# Patient Record
Sex: Female | Born: 1960 | Race: White | Hispanic: No | Marital: Married | Smoking: Never smoker
Health system: Southern US, Community
[De-identification: ages and names within clinical notes are randomized; demographics above are authoritative.]

## PROBLEM LIST (undated history)

## (undated) DIAGNOSIS — R42 Dizziness and giddiness: Secondary | ICD-10-CM

## (undated) DIAGNOSIS — M1990 Unspecified osteoarthritis, unspecified site: Secondary | ICD-10-CM

## (undated) DIAGNOSIS — R002 Palpitations: Secondary | ICD-10-CM

## (undated) DIAGNOSIS — I499 Cardiac arrhythmia, unspecified: Secondary | ICD-10-CM

## (undated) DIAGNOSIS — I4891 Unspecified atrial fibrillation: Secondary | ICD-10-CM

## (undated) DIAGNOSIS — Z780 Asymptomatic menopausal state: Secondary | ICD-10-CM

## (undated) DIAGNOSIS — E785 Hyperlipidemia, unspecified: Secondary | ICD-10-CM

## (undated) DIAGNOSIS — D649 Anemia, unspecified: Secondary | ICD-10-CM

## (undated) DIAGNOSIS — C801 Malignant (primary) neoplasm, unspecified: Secondary | ICD-10-CM

## (undated) DIAGNOSIS — E538 Deficiency of other specified B group vitamins: Secondary | ICD-10-CM

## (undated) DIAGNOSIS — E039 Hypothyroidism, unspecified: Secondary | ICD-10-CM

## (undated) DIAGNOSIS — I10 Essential (primary) hypertension: Secondary | ICD-10-CM

## (undated) HISTORY — DX: Palpitations: R00.2

## (undated) HISTORY — DX: Asymptomatic menopausal state: Z78.0

## (undated) HISTORY — DX: Unspecified atrial fibrillation: I48.91

## (undated) HISTORY — DX: Hyperlipidemia, unspecified: E78.5

## (undated) HISTORY — PX: ABDOMINAL HYSTERECTOMY: SHX81

## (undated) HISTORY — PX: OTHER SURGICAL HISTORY: SHX169

## (undated) HISTORY — DX: Anemia, unspecified: D64.9

## (undated) MED FILL — Dexamethasone Sodium Phosphate Inj 100 MG/10ML: INTRAMUSCULAR | Qty: 1 | Status: AC

---

## 2006-08-24 ENCOUNTER — Ambulatory Visit: Payer: Self-pay | Admitting: Unknown Physician Specialty

## 2006-08-24 HISTORY — PX: DILATION AND CURETTAGE OF UTERUS: SHX78

## 2006-09-12 HISTORY — PX: BILATERAL SALPINGOOPHORECTOMY: SHX1223

## 2006-09-12 HISTORY — PX: LAPAROSCOPIC SUPRACERVICAL HYSTERECTOMY: SUR797

## 2006-10-05 ENCOUNTER — Ambulatory Visit: Payer: Self-pay | Admitting: Unknown Physician Specialty

## 2006-12-07 ENCOUNTER — Ambulatory Visit: Payer: Self-pay | Admitting: Unknown Physician Specialty

## 2008-03-12 HISTORY — PX: NOSE SURGERY: SHX723

## 2008-04-17 ENCOUNTER — Ambulatory Visit: Payer: Self-pay | Admitting: Unknown Physician Specialty

## 2008-07-05 ENCOUNTER — Ambulatory Visit: Payer: Self-pay | Admitting: *Deleted

## 2008-07-06 ENCOUNTER — Observation Stay (HOSPITAL_COMMUNITY): Admission: EM | Admit: 2008-07-06 | Discharge: 2008-07-06 | Payer: Self-pay | Admitting: Emergency Medicine

## 2008-07-15 ENCOUNTER — Ambulatory Visit: Payer: Self-pay

## 2008-07-15 ENCOUNTER — Encounter: Payer: Self-pay | Admitting: Cardiology

## 2008-07-22 ENCOUNTER — Ambulatory Visit: Payer: Self-pay | Admitting: Cardiology

## 2008-08-29 ENCOUNTER — Ambulatory Visit: Payer: Self-pay | Admitting: Cardiology

## 2009-02-14 DIAGNOSIS — R002 Palpitations: Secondary | ICD-10-CM | POA: Insufficient documentation

## 2009-02-14 DIAGNOSIS — I4891 Unspecified atrial fibrillation: Secondary | ICD-10-CM | POA: Insufficient documentation

## 2009-02-17 ENCOUNTER — Ambulatory Visit: Payer: Self-pay | Admitting: Cardiology

## 2009-04-22 ENCOUNTER — Ambulatory Visit: Payer: Self-pay | Admitting: Unknown Physician Specialty

## 2009-12-08 ENCOUNTER — Ambulatory Visit: Payer: Self-pay

## 2009-12-24 ENCOUNTER — Ambulatory Visit: Payer: Self-pay | Admitting: Family Medicine

## 2010-02-18 ENCOUNTER — Ambulatory Visit: Payer: Self-pay | Admitting: Cardiovascular Disease

## 2010-02-22 ENCOUNTER — Ambulatory Visit: Payer: Self-pay | Admitting: Cardiovascular Disease

## 2010-02-25 LAB — CONVERTED CEMR LAB: Triglycerides: 408 mg/dL — ABNORMAL HIGH (ref <150)

## 2010-05-18 ENCOUNTER — Ambulatory Visit: Payer: Self-pay | Admitting: Unknown Physician Specialty

## 2010-10-14 NOTE — Assessment & Plan Note (Signed)
Summary: EC6/AMD  Medications Added ASPIRIN 81 MG TABS (ASPIRIN) 1 once daily      Allergies Added: ! SULFA  Visit Type:  Initial Consult Referring Provider:  Valera Castle, M.D. Primary Provider:  Blane Ohara, M.D.  CC:  no complaints.  History of Present Illness: Ariana Wilson is a very pleasant 50 year old woman with a history of paroxysmal atrial fibrillation who has been managed on diltiazem XRT 240 mg daily with little breakthrough arrhythmia. She presents for routine followup.  She states that over the past year, she has felt well. Occasionally she has some brief episodes of tachyarrhythmia in the evenings and she is due to take her dose of diltiazem. She is to take it in the morning but has switched it to the evenings. Overall she feels well with no shortness of breath, palpitations. She is active, works as a Chartered loss adjuster. She's been trying to lose weight and works out at [employer] when she can.  Current Medications (verified): 1)  Premarin 0.625 Mg Tabs (Estrogens Conjugated) .[name].. 1 By Mouth Once Daily 2)  Allegra 180 Mg Tabs (Fexofenadine Hcl) .[name].. 1 By Mouth Once Daily 3)  Dhea 15-50 Mg Caps (Nutritional Supplements) .[name].. 1 By Mouth Once Daily 4)  B-Complex/b-12  Tabs (B Complex Vitamins) .[name].. 1 By Mouth Once Daily 5)  Vitamin E 400 Unit Caps (Vitamin E) .[name].. 1 By Mouth Once Daily 6)  Aspirin 81 Mg Tabs (Aspirin) .[name].. 1 Once Daily 7)  Diltiazem Hcl Er Beads 240 Mg Xr24h-Cap (Diltiazem Hcl Er Beads) .... Take One Capsule By Mouth Daily  Allergies (verified): 1)  ! Sulfa  Past History:  Past Medical History: Last updated: 02/14/2009 Atrial Fibrillation Hx: palpitations 25 years ago  Past Surgical History: Last updated: 02/14/2009 Abdominal Hysterectomy-Total Bilateral salpingoophyorectomy  Family History: Last updated: 02/14/2009 Father: CABG age of 39. Sister: atrial fib  Social History: Last updated: 02/14/2009 Full Time Married  Tobacco Use - No.  Alcohol  Use - no Regular Exercise - yes Drug Use - no 2 children  Risk Factors: Exercise: yes (02/14/2009)  Risk Factors: Smoking Status: never (02/14/2009)  Review of Systems  The patient denies fever, weight loss, weight gain, vision loss, decreased hearing, hoarseness, chest pain, syncope, dyspnea on exertion, peripheral edema, prolonged cough, abdominal pain, incontinence, muscle weakness, depression, and enlarged lymph nodes.         Palpitations  Vital Signs:  Patient profile:   50 year old female Height:      60 inches Weight:      170 pounds BMI:     33.32 Pulse rate:   73 / minute BP sitting:   121 / 78  (right arm) Cuff size:   regular  Vitals Entered By: Bishop Dublin, CMA (February 18, 2010 3:43 PM)  Physical Exam  General:  Well developed, well nourished, in no acute distress. Head:  normocephalic and atraumatic Neck:  Neck supple, no JVD. No masses, thyromegaly or abnormal cervical nodes. Lungs:  Clear bilaterally to auscultation and percussion. Heart:  Non-displaced PMI, chest non-tender; regular rate and rhythm, S1, S2 without murmurs, rubs or gallops. Carotid upstroke normal, no bruit. . Pedals normal pulses. No edema, no varicosities. Abdomen:  Bowel sounds positive; abdomen soft and non-tender without masses,  Msk:  Back normal, normal gait. Muscle strength and tone normal. Pulses:  pulses normal in all 4 extremities Extremities:  No clubbing or cyanosis. Neurologic:  Alert and oriented x 3. Skin:  Intact without lesions or rashes. Psych:  Normal affect.   Impression & Recommendations:  Problem # 1:  ATRIAL FIBRILLATION (ICD-427.31) currently maintaining sinus rhythm. Few episodes of palpitations. She is not on Coumadin, only aspirin. We'll continue her on her current dose of diltiazem.  Her updated medication list for this problem includes:    Aspirin 81 Mg Tabs (Aspirin) .[name]... 1 once daily  Orders: EKG w/ Interpretation (93000)  Problem # 2:   PREVENTIVE HEALTH CARE (ICD-V70.0) She has a strong family history of coronary artery disease. Father had CAD at age 23. We have suggested that she check her cholesterol as it has not been checked in many years.  Patient Instructions: 1)  Your physician recommends that you return for a FASTING lipid profile: on MONDAY  2)  Your physician recommends that you continue on your current medications as directed. Please refer to the Current Medication list given to you today. 3)  Your physician wants you to follow-up in:   12 months You will receive a reminder letter in the mail two months in advance. If you don't receive a letter, please call our office to schedule the follow-up appointment. Prescriptions: DILTIAZEM HCL ER BEADS 240 MG XR24H-CAP (DILTIAZEM HCL ER BEADS) Take one capsule by mouth daily  #90 x 4   Entered by:   Benedict Needy, RN   Authorized by:   Dossie Arbour MD   Signed by:   Benedict Needy, RN on 02/18/2010   Method used:   Electronically to        [facility] [phone]* (retail)       [address]       [place], [place]  [address]       Ph: [phone] or [phone]       Fax: [phone]   RxID:   [id number]

## 2010-11-12 ENCOUNTER — Ambulatory Visit: Payer: Self-pay | Admitting: Internal Medicine

## 2011-01-25 NOTE — Assessment & Plan Note (Signed)
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   She has had about 10 episodes of the past year.  Some will last minutes  and 1 last as long as an hour.  She has not had to visit an Urgent Care  Center or emergency room.   She remains on diltiazem extended release 240 mg each night.  She has  found out that if she takes at night, it seems to be do better.  She  remains on aspirin now takes 81 mg per day as supposed to 325.   She has been very health conscious and has been working out at [employer] 3 times a week.  She has lost down from 173-159.   PHYSICAL EXAMINATION:  VITAL SIGNS:  Her blood pressure today is 114/78,  her pulse is 76 and regular, her weight is 159.  NECK:  Supple.  Carotid upstrokes were equal bilaterally without bruits.  No thyromegaly and trachea is midline.  LUNGS:  Clear to auscultation and percussion.  HEART:  Nondisplaced PMI, normal S1 and S2.  No gallop.  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  ABDOMEN:  Soft.  Good bowel sounds.  No midline bruit.  No hepatomegaly.  EXTREMITIES:  No cyanosis, clubbing, or edema.  Pulses are brisk.  NEURO:  Intact.  SKIN:  Shows freckles, otherwise unremarkable.   ASSESSMENT AND PLAN:  Ariana Wilson is doing well.  I have renewed her  diltiazem and advised her that this seems to be working better at night  to continue do so.  She will remain on aspirin at 81 mg a day.  I will  see her back in a year.      Thomas C. Daleen Squibb, MD, [facility]  Electronically Signed    TCW/MedQ  DD: 02/17/2009  DT: 02/18/2009  Job #: [id number]

## 2011-01-25 NOTE — Assessment & Plan Note (Signed)
[facility] OFFICE NOTE   NAME:ISLEYShaelynn, Dragos                          MRN:          [id number]  DATE:08/29/2008                            DOB:          [birth year]    Ms. Clippinger comes in today for followup of her idiopathic atrial fib.   She still has occasional palpitations that can last seconds to one event  which lasted about an hour.  She has not had any prolonged events that  did not respond, but did not spontaneously convert.   MEDICATIONS:  She is on:  1. Diltiazem 240 mg a day.  2. Aspirin 325 mg a day.   PHYSICAL EXAMINATION:  VITAL SIGNS:  Her blood pressure today is 118/78,  her pulse is 80 and regular, and weight is 173.  HEENT:  Normal.  NECK:  Carotids upstrokes were equal bilaterally without bruits.  No  thyromegaly.  LUNGS:  Clear to auscultation and percussion.  HEART:  Regular rate and rhythm.  No gallop or rub or murmur.  PMI could  not be appreciated.  ABDOMEN:  Soft, good bowel sounds.  EXTREMITIES:  No cyanosis, clubbing, or edema.  Pulses are intact.  NEURO:  Intact.   Ms. Raphael is doing well.  I spent about 20 minutes answering questions  about future therapy, prognosis, and how to react to and respond to  prolonged atrial fibrillation, i.e., greater than 24 hours.  I will plan  on seeing her back again in 6 months unless she has increased  breakthroughs.     Thomas C. Daleen Squibb, MD, [facility]  Electronically Signed    TCW/MedQ  DD: 08/29/2008  DT: 08/30/2008  Job #: [id number]

## 2011-01-25 NOTE — H&P (Signed)
NAMEAFTIN, LYE                 ACCOUNT NO.:  [id number]   MEDICAL RECORD NO.:  [id number]          PATIENT TYPE:  EMS   LOCATION:  MAJO                         FACILITY:  [facility]   PHYSICIAN:  Glennie Isle, MD   DATE OF BIRTH:  10-Oct-1960   DATE OF ADMISSION:  07/05/2008  DATE OF DISCHARGE:                              HISTORY & PHYSICAL   CARDIOLOGIST:  The patient is new to the [place] system.   CHIEF COMPLAINT:  Palpitations.   HISTORY OF PRESENT ILLNESS:  The patient is a 50 year old female with a  history of palpitations approximately 25 years ago who currently  presents with palpitations since 10 p.m.  She states that these are also  associated with some chest pressure though she denies any diaphoresis,  lightheadedness.  The patient does state that she had sudden onset of  palpitations that awoke her.  She was found to be in atrial fibrillation  with rapid ventricular response here in the ER and required IV diltiazem  10 mg x2.  She denies any alcohol, caffeine or weight loss supplements.  She does endorse palpitations for the past few months lasting a few  seconds.  She states she has occasional chest pressure and fatigue for  the past several months as well.  She is occasionally too tired to run  anymore.  She denies any thyroid symptoms.   PAST MEDICAL HISTORY:  1. History of palpitations 25 years ago.  2. History of total abdominal hysterectomy and bilateral salpingo-      oophorectomy.   ALLERGIES:  SULFA.   MEDICATIONS:  1. Premarin 0.625 mg.  2. Allegra 180 mg daily.  3. DHEA, B12 and vitamin E.   SOCIAL HISTORY:  She has two children.  Denies any tobacco or alcohol or  IV drug use.  She currently works as a Lawyer and former  Lawyer.   FAMILY HISTORY:  Father had a CABG at age 67.  Sister with atrial  fibrillation.   REVIEW OF SYSTEMS:  A 14-point review of systems was performed and was  negative except as noted per HPI.   PHYSICAL  EXAMINATION:  VITAL SIGNS: Temperature 97.7, pulse 83,  respiratory rate 20, blood pressure 100/58.  GENERAL APPEARANCE:  In no apparent distress.  HEENT:  Pupils are equal, round and reactive to light.  Extraocular  movements intact.  Oropharynx clear.  NECK:  Supple.  No thyromegaly appreciated.  No bruits.  No JVD.  CARDIOVASCULAR:  S1 and S2 normal. No murmurs or rubs are heard.  No S3  or S4.  LUNGS:  Clear to auscultation bilaterally.  No crackles or wheezes.  SKIN:  No rashes.  ABDOMEN:  Soft, nontender, nondistended.  EXTREMITIES:  No clubbing, cyanosis or edema is noted.  MUSCULOSKELETAL:  No joint deformities.  NEUROLOGIC:  Alert and oriented x3.  Cranial nerves II-XII intact.  Strength is 5/5 upper and lower extremities bilaterally.  EKG:  First EKG shows atrial fibrillation with RVR with mild 0.5 to 1 mm  ST depression  in V2 to V5.  Most recent EKG shows normal  sinus rhythm.  No ST changes.   LABORATORY DATA:  White count 8.5, hemoglobin 14.5, platelets 264,000.  BMP shows BUN of 11, creatinine 0.8, potassium 3.5.  First set of  cardiac enzymes are negative with troponin less than 0.05 and a MB of  2.2.   ASSESSMENT/PLAN:  New onset of atrial fibrillation.  It is unclear the  duration of the atrial fibrillation.  The patient has had some  palpitations for the past several months on and off lasting a few  seconds.  The patient's  CHADS2 score is 0.  Consequently, we will treat  the patient with aspirin 325 mg.  We will check her thyroid function  panel, get an echocardiogram in the morning and continue to rule out  cardiac markers.  I would favor stress test on Monday given that the  patient does have a history of fatigue and chest pressure with activity  and occasional pain radiating to her left arm.  We will initiate low  dose diltiazem 50 mg p.o. q.i.d. We can discontinue that tomorrow  evening the day prior to stress test.      Glennie Isle, MD   Electronically Signed     SS/MEDQ  D:  07/06/2008  T:  07/06/2008  Job:  [id number]

## 2011-01-25 NOTE — Discharge Summary (Signed)
Ariana Wilson, Ariana Wilson                 ACCOUNT NO.:  [id number]   MEDICAL RECORD NO.:  [id number]          PATIENT TYPE:  INP   LOCATION:  2039                         FACILITY:  [facility]   PHYSICIAN:  Doylene Canning. Ladona Ridgel, MD    DATE OF BIRTH:  1961-02-25   DATE OF ADMISSION:  07/05/2008  DATE OF DISCHARGE:  07/06/2008                               DISCHARGE SUMMARY   DISCHARGE DIAGNOSES:  1. Atrial fibrillation, felt to be paroxysmal, probably idiopathic,      unknown duration with a CHADS score of 0.  Initiated Cardizem      therapy during hospitalization, being discharged home on diltiazem      XR 180 mg daily.  Echocardiogram to be done as outpatient and      follow up with Dr. Daleen Squibb.  2. Chest discomfort, most likely secondary to atrial fibrillation.  3. History of palpitations with strong family history of atrial      fibrillation and heart disease in father.   Ariana Wilson is a 50 year old female with no cardiac known history who  presents with palpitations and chest discomfort with a history of  palpitations.  No previous cardiac workup noted.  She has a history of  total abdominal hysterectomy, a father with bypass at age 18, and a  sister with atrial fibrillation.  She presented on day of admission with  chest discomfort.  A 12-lead EKG showing atrial fibrillation.  Hemoglobin 14.5, creatinine 0.8, and potassium 3.5.  Cardiac enzymes  were negative x2 sets.  Thyroid function within normal limits.  Dr. Daleen Squibb  in to see the patient on July 06, 2008.  Pending last set of cardiac  enzymes, if negative the patient will be allowed to be discharged home  to follow up with a 2-D echocardiogram in the office and follow up with  Dr. Daleen Squibb within the next 2 weeks.  We will continue to watch for third  set of enzymes and hopefully discharge the patient home today.  I have  written a prescription for her diltiazem.  She is to continue 180 mg  daily, and aspirin 325 daily.  She can resume her home  medications  including her Premarin, Allegra, calcium, and vitamins as previously  prescribed.   DURATION OF DISCHARGE ENCOUNTER:  Greater than 30 minutes.      Dorian Pod, ACNP      Doylene Canning. Ladona Ridgel, MD  Electronically Signed    MB/MEDQ  D:  07/06/2008  T:  07/07/2008  Job:  [id number]

## 2011-01-25 NOTE — Assessment & Plan Note (Signed)
[facility] OFFICE NOTE   NAME:ISLEYShawnna, Pancake                          MRN:          [id number]  DATE:07/22/2008                            DOB:          09-Jan-1961    Ariana Wilson returns today after being discharged from the hospital on  July 06, 2008, after experiencing new-onset idiopathic atrial  fibrillation.   She presented with chest discomfort.   Blood work was negative including a thyroid panel, cardiac enzymes.  A 2-  D echocardiogram subsequently in the office is normal.   She continues to have some palpitations.  She has had this most of her  life.  She has a PAC on her EKG today and otherwise sinus rhythm.   CURRENT MEDICATIONS:  1. Diltiazem XR 180 mg a day.  2. Aspirin 325 mg a day.  3. She is also on Premarin 0.625 mg per day.  4. Allegra 180 mg a day.  5. DHEA.  6. B12 complex.  7. Vitamin E.   PHYSICAL EXAMINATION:  GENERAL:  She is pleasant lady in no acute  distress.  VITAL SIGNS:  Her blood pressure is 150/72, her pulse is currently 68  and regular.  Her weight is 174.  HEENT:  Unchanged and normal.  NECK:  Carotids are full without bruits.  Thyroid is not enlarged.  LUNGS:  Clear to auscultation and percussion.  HEART:  Poorly appreciated PMI, soft S1 and S2.  No gallop.  EXTREMITIES:  No cyanosis, clubbing, or edema.  Pulses are intact.   EKG is normal except for the PAC.   I had a long talk with Ariana Wilson today.  All her questions were  answered.  I have encouraged her to eliminate caffeine, increase her  diltiazem 240 mg a day, and to see [place] back in about 6 weeks.  We also  talked about how to respond to future events of atrial fib.     Thomas C. Daleen Squibb, MD, [facility]  Electronically Signed    TCW/MedQ  DD: 07/22/2008  DT: 07/23/2008  Job #: [id number]

## 2011-02-24 ENCOUNTER — Encounter: Payer: Self-pay | Admitting: Cardiovascular Disease

## 2011-03-07 ENCOUNTER — Ambulatory Visit (INDEPENDENT_AMBULATORY_CARE_PROVIDER_SITE_OTHER): Payer: BC Managed Care – PPO | Admitting: Cardiovascular Disease

## 2011-03-07 ENCOUNTER — Encounter: Payer: Self-pay | Admitting: Cardiovascular Disease

## 2011-03-07 DIAGNOSIS — E785 Hyperlipidemia, unspecified: Secondary | ICD-10-CM | POA: Insufficient documentation

## 2011-03-07 DIAGNOSIS — I4891 Unspecified atrial fibrillation: Secondary | ICD-10-CM

## 2011-03-07 MED ORDER — DILTIAZEM HCL ER COATED BEADS 180 MG PO CP24: 180.0000 mg | ORAL_CAPSULE | Freq: Every day | ORAL | Status: DC

## 2011-03-07 NOTE — Assessment & Plan Note (Signed)
No further episodes of atrial fibrillation that she can appreciate. We will decrease her dose of diltiazem to 180 mg at her request. She does not have any particular symptoms on diltiazem 240 mg daily.

## 2011-03-07 NOTE — Progress Notes (Signed)
   Patient ID: Ariana Wilson, female    DOB: [birth year], 50 y.o.   MRN: [id number]  HPI Comments: Ms. Pulcini is a very pleasant 50 year old woman with a history of paroxysmal atrial fibrillation who has been managed on diltiazem XRT 240 mg daily with little breakthrough arrhythmia. She presents for routine followup.Her last episode of atrial fibrillation was November 2009. At that time, her rhythm converted to sinus without cardioversion.   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 She denies any significant tachycardia or palpitations. She is interested in being on a lower dose of diltiazem if possible. She has been biking, vacationing with no symptoms. No significant shortness breath or chest discomfort.  Blood pressure at home typically runs in the 120/60 range  EKG shows normal sinus rhythm with a rate of 70 beats per minute with no significant ST or T wave changes      Review of Systems  Constitutional: Negative.   HENT: Negative.   Eyes: Negative.   Respiratory: Negative.   Cardiovascular: Negative.   Gastrointestinal: Negative.   Musculoskeletal: Negative.   Skin: Negative.   Neurological: Negative.   Hematological: Negative.   Psychiatric/Behavioral: Negative.   All other systems reviewed and are negative.    BP 131/82  Pulse 72  Ht 5' (1.524 m)  Wt 167 lb (75.751 kg)  BMI 32.62 kg/m2   Physical Exam  Nursing note and vitals reviewed. Constitutional: She is oriented to person, place, and time. She appears well-developed and well-nourished.       obese  HENT:  Head: Normocephalic.  Nose: Nose normal.  Mouth/Throat: Oropharynx is clear and moist.  Eyes: Conjunctivae are normal. Pupils are equal, round, and reactive to light.  Neck: Normal range of motion. Neck supple. No JVD present.  Cardiovascular: Normal rate, regular rhythm, S1 normal, S2 normal, normal heart sounds and intact distal pulses.  Exam reveals no gallop and no  friction rub.   No murmur heard. Pulmonary/Chest: Effort normal and breath sounds normal. No respiratory distress. She has no wheezes. She has no rales. She exhibits no tenderness.  Abdominal: Soft. Bowel sounds are normal. She exhibits no distension. There is no tenderness.  Musculoskeletal: Normal range of motion. She exhibits no edema and no tenderness.  Lymphadenopathy:    She has no cervical adenopathy.  Neurological: She is alert and oriented to person, place, and time. Coordination normal.  Skin: Skin is warm and dry. No rash noted. No erythema.  Psychiatric: She has a normal mood and affect. Her behavior is normal. Judgment and thought content normal.         Assessment and Plan

## 2011-03-07 NOTE — Patient Instructions (Signed)
Your physician has recommended you make the following change in your medication: DECREASE Diltiazem to 120mg  daily.  Your physician recommends that you schedule a follow-up appointment in: 1 year

## 2011-03-07 NOTE — Assessment & Plan Note (Addendum)
We did spend significant time talking with her about her cholesterol. She reports recent cholesterol 280. This is likely secondary to obesity and genetics. She is not interested in any cholesterol medication at this time. Given her strong family history with father who had bypass surgery at age 50, she should be on a cholesterol medication with goal LDL less than 100.

## 2011-03-11 ENCOUNTER — Other Ambulatory Visit (INDEPENDENT_AMBULATORY_CARE_PROVIDER_SITE_OTHER): Payer: BC Managed Care – PPO | Admitting: *Deleted

## 2011-03-11 DIAGNOSIS — E785 Hyperlipidemia, unspecified: Secondary | ICD-10-CM

## 2011-03-18 ENCOUNTER — Telehealth: Payer: Self-pay | Admitting: Cardiovascular Disease

## 2011-03-18 NOTE — Telephone Encounter (Signed)
Results had not come through system, I have requested to be sent, and will call pt w/ results.

## 2011-03-18 NOTE — Telephone Encounter (Signed)
Would like lab results

## 2011-03-21 ENCOUNTER — Encounter: Payer: Self-pay | Admitting: Cardiovascular Disease

## 2011-03-22 ENCOUNTER — Telehealth: Payer: Self-pay | Admitting: *Deleted

## 2011-03-22 MED ORDER — ATORVASTATIN CALCIUM 10 MG PO TABS: 10.0000 mg | ORAL_TABLET | Freq: Every day | ORAL | Status: DC

## 2011-03-22 NOTE — Telephone Encounter (Signed)
Pt notified of lab results, per pt letter sent by TG, pt does want to start on low dose chol med. Will discuss with TG and call pt back.

## 2011-03-22 NOTE — Telephone Encounter (Signed)
Called and notified pt that if she wants to start on cholesterol medication due to her family hx and her recent labs, then he recommends Lipitor 10mg . Rx called in, and pt will return in 3 months for labwork.

## 2011-03-25 ENCOUNTER — Ambulatory Visit: Payer: Self-pay | Admitting: General Surgery

## 2011-06-14 ENCOUNTER — Ambulatory Visit: Payer: Self-pay | Admitting: Unknown Physician Specialty

## 2011-06-14 LAB — TROPONIN I: Troponin I: 0.02

## 2011-06-14 LAB — BASIC METABOLIC PANEL
Calcium: 8.5
Chloride: 104
Creatinine, Ser: 0.82
GFR calc Af Amer: >60
GFR calc non Af Amer: >60
Potassium: 3.5
Sodium: 136

## 2011-06-14 LAB — CBC
MCV: 93.3
RBC: 4.43
RDW: 12.7

## 2011-06-14 LAB — DIFFERENTIAL
Eosinophils Absolute: 0.2
Lymphocytes Relative: 30
Lymphs Abs: 2.6
Monocytes Absolute: 0.7
Monocytes Relative: 8

## 2011-06-14 LAB — CK TOTAL AND CKMB (NOT AT ARMC): CK, MB: 1.8

## 2011-06-14 LAB — CARDIAC PANEL(CRET KIN+CKTOT+MB+TROPI)
CK, MB: 1.5
Troponin I: 0.02

## 2011-06-14 LAB — POCT CARDIAC MARKERS: Troponin i, poc: <0.05

## 2011-06-21 ENCOUNTER — Ambulatory Visit (INDEPENDENT_AMBULATORY_CARE_PROVIDER_SITE_OTHER): Payer: BC Managed Care – PPO | Admitting: *Deleted

## 2011-06-21 DIAGNOSIS — E785 Hyperlipidemia, unspecified: Secondary | ICD-10-CM

## 2011-06-22 LAB — LIPID PANEL
LDL Cholesterol: 84 mg/dL (ref 0–99)
Triglycerides: 172 mg/dL — ABNORMAL HIGH (ref <150)
VLDL: 34 mg/dL (ref 0–40)

## 2011-06-22 LAB — HEPATIC FUNCTION PANEL
Albumin: 4.5 g/dL (ref 3.5–5.2)
Alkaline Phosphatase: 57 U/L (ref 39–117)
Indirect Bilirubin: 0.5 mg/dL (ref 0.0–0.9)
Total Bilirubin: 0.6 mg/dL (ref 0.3–1.2)

## 2011-06-25 ENCOUNTER — Encounter: Payer: Self-pay | Admitting: Cardiovascular Disease

## 2011-07-14 ENCOUNTER — Ambulatory Visit (INDEPENDENT_AMBULATORY_CARE_PROVIDER_SITE_OTHER): Payer: BC Managed Care – PPO | Admitting: Internal Medicine

## 2011-07-14 ENCOUNTER — Encounter: Payer: Self-pay | Admitting: Internal Medicine

## 2011-07-14 VITALS — BP 110/72 | HR 77 | Temp 97.3°F | Ht 61.5 in | Wt 173.0 lb

## 2011-07-14 DIAGNOSIS — N951 Menopausal and female climacteric states: Secondary | ICD-10-CM

## 2011-07-14 DIAGNOSIS — E785 Hyperlipidemia, unspecified: Secondary | ICD-10-CM

## 2011-07-14 DIAGNOSIS — R635 Abnormal weight gain: Secondary | ICD-10-CM

## 2011-07-14 DIAGNOSIS — I4891 Unspecified atrial fibrillation: Secondary | ICD-10-CM

## 2011-07-15 ENCOUNTER — Encounter: Payer: Self-pay | Admitting: Internal Medicine

## 2011-07-15 DIAGNOSIS — D649 Anemia, unspecified: Secondary | ICD-10-CM | POA: Insufficient documentation

## 2011-07-15 DIAGNOSIS — Z780 Asymptomatic menopausal state: Secondary | ICD-10-CM | POA: Insufficient documentation

## 2011-07-15 DIAGNOSIS — D61811 Other drug-induced pancytopenia: Secondary | ICD-10-CM | POA: Insufficient documentation

## 2011-07-15 LAB — CBC WITH DIFFERENTIAL/PLATELET
Basophils Relative: 0 % (ref 0–1)
Eosinophils Absolute: 0.4 10*3/uL (ref 0.0–0.7)
Eosinophils Relative: 6 % — ABNORMAL HIGH (ref 0–5)
HCT: 41.8 % (ref 36.0–46.0)
Hemoglobin: 14.3 g/dL (ref 12.0–15.0)
MCH: 32.1 pg (ref 26.0–34.0)
MCHC: 34.2 g/dL (ref 30.0–36.0)
Monocytes Absolute: 0.7 10*3/uL (ref 0.1–1.0)
Monocytes Relative: 11 % (ref 3–12)

## 2011-07-15 NOTE — Progress Notes (Signed)
Subjective:    Patient ID: Ariana Wilson, female    DOB: [birth year], 50 y.o.   MRN: [id number]  HPI New pt. Here for first visit.  PMH of a fib initially dx 2009, spontaneously converted and rate controlled on Diltiazem 180 mg, hyperlipidemia, endometriosis S/P hysterectomy, anemia in past and symptomatic menopause.  Her vasomotor flushes have been quite well controlled on Premarin 0.625 mg by her GYN Dr. Haskel Khan but she wishes to discuss other options,  She has been on Premarin since 2008.  She is most concerned about weight gain and decrease in short term memory.  "I can't seem to multi-task like I could in the past"  She is active, trained as a CMA, now works as a Geophysicist/field seismologist and husband is a Optician, dispensing.  She doses admit to depression in the past but describes as mild.  She has no chest pain, no palpitations or SOB.  She is up to date with her pap 04/2011 normal per her report,  Mammogram 06/2011 normal per her report and colonoscopy 03/2011 per her report.      Allergies  Allergen Reactions  . Sulfonamide Derivatives Swelling    Tongue swelling   Past Medical History  Diagnosis Date  . Atrial fibrillation   . Palpitations     Hx of, 25 years ago  . Menopause   . Anemia   . Hyperlipidemia    Past Surgical History  Procedure Date  . Total abdominal hysterectomy   . Bilateral salpingoophorectomy   . Dilation and curettage of uterus 08/24/06  . Nose surgery 7/09   History   Social History  . Marital Status: Married    Spouse Name: N/A    Number of Children: N/A  . Years of Education: N/A   Occupational History  . Full time    Social History Main Topics  . Smoking status: Never Smoker   . Smokeless tobacco: Not on file  . Alcohol Use: No  . Drug Use: No  . Sexually Active: Not on file   Other Topics Concern  . Not on file   Social History Narrative   Married with 2 childrenGets regular exercise   Family History  Problem Relation Age of Onset  . Arrhythmia  Sister     Atrial fibrillation  . Heart disease Sister   . Heart disease Father   . Hyperlipidemia Father   . Hyperlipidemia Mother   . Hypertension Mother    Patient Active Problem List  Diagnoses  . ATRIAL FIBRILLATION  . PALPITATIONS  . Hyperlipemia  . Anemia  . Hyperlipidemia  . Menopause   Current Outpatient Prescriptions on File Prior to Visit  Medication Sig Dispense Refill  . aspirin 81 MG tablet Take 81 mg by mouth daily.        [name] atorvastatin (LIPITOR) 10 MG tablet Take 1 tablet (10 mg total) by mouth daily.  30 tablet  6  . Cholecalciferol (VITAMIN D3) 5000 UNITS TABS Take 5,000 Units by mouth 1 dose over 24 hours.        [name] diltiazem (CARDIZEM CD) 180 MG 24 hr capsule Take 1 capsule (180 mg total) by mouth daily.  30 capsule  6  . estrogens, conjugated, (PREMARIN) 0.625 MG tablet Take 0.625 mg by mouth daily. Take daily for 21 days then do not take for 7 days.       . fexofenadine (ALLEGRA) 180 MG tablet Take 180 mg by mouth daily.        [name]  Nutritional Supplements (DHEA) 15-50 MG CAPS Take 1 capsule by mouth daily.        . Omega-3 Fatty Acids (FISH OIL) 1200 MG CAPS Take by mouth. Take two tablets daily.               Review of Systems    see HPI Objective:   Physical Exam  Physical Exam  Nursing note and vitals reviewed.  Constitutional: She is oriented to person, place, and time. She appears well-developed and well-nourished.  HENT:  Head: Normocephalic and atraumatic.  Cardiovascular: Normal rate and regular rhythm. Exam reveals no gallop and no friction rub.  No murmur heard.  Pulmonary/Chest: Breath sounds normal. She has no wheezes. She has no rales.  Neurological: She is alert and oriented to person, place, and time.  Skin: Skin is warm and dry.  Psychiatric: She has a normal mood and affect. Her behavior is normal.       Assessment & Plan:  1)  Menopausal symptoms:  Pt given educational handout regarding risk/benefit of HT and multiple routes  of administration.  I do not feel changing route of administration at this point will change memory or focus.  I think short term memory changes are also in part age related and will not change with change in estrogen.  Anxiety or situational mild depression or too much multitaksing may be playing a part. I counseled of risk of breast cancer around 6-7 years of estrogen.  She voices understanding 2) Wt gain will check TSH.  Encouraged portion control and exercise  Given DAsh diet for heart health 3) A fib  Rate controlled 4)  H/o remote anemia  Check CBC today 5) Hyperlipidemia on Lipitor per cardiologist

## 2011-07-15 NOTE — Patient Instructions (Signed)
Continue current therapy  Discuss tapering Premarin with your GYN md

## 2011-07-18 ENCOUNTER — Encounter: Payer: Self-pay | Admitting: Emergency Medicine

## 2011-10-17 ENCOUNTER — Other Ambulatory Visit: Payer: Self-pay | Admitting: Cardiovascular Disease

## 2011-11-15 ENCOUNTER — Ambulatory Visit: Payer: Self-pay | Admitting: Internal Medicine

## 2012-01-26 ENCOUNTER — Other Ambulatory Visit: Payer: Self-pay | Admitting: Cardiovascular Disease

## 2012-01-27 ENCOUNTER — Other Ambulatory Visit: Payer: Self-pay | Admitting: *Deleted

## 2012-01-27 MED ORDER — DILTIAZEM HCL 120 MG PO TABS: 120.0000 mg | ORAL_TABLET | Freq: Every day | ORAL | Status: DC

## 2012-01-30 ENCOUNTER — Telehealth: Payer: Self-pay | Admitting: Cardiovascular Disease

## 2012-01-30 MED ORDER — DILTIAZEM HCL ER COATED BEADS 180 MG PO CP24: 180.0000 mg | ORAL_CAPSULE | Freq: Every day | ORAL | Status: DC

## 2012-01-30 NOTE — Telephone Encounter (Signed)
Called diltiazem 180 mg daily since last office note stated 180 mg in Dr Windell Hummingbird dictation.  Patient states she has been taking 180 mg for a while

## 2012-01-30 NOTE — Telephone Encounter (Signed)
Pt called stating that she called in a refill for her diltiazem and it was for 120. She states that she has been taking 180mg  ER for a year. Last office note states that Dr Mariah Milling changed her meds to 120. Pt needs verification of what to take

## 2012-01-30 NOTE — Telephone Encounter (Signed)
Left message for pt, according to the last office note from dr Mariah Milling the pt should be taking 180 mg of diltiazem.

## 2012-03-08 ENCOUNTER — Encounter: Payer: Self-pay | Admitting: Cardiovascular Disease

## 2012-03-08 ENCOUNTER — Ambulatory Visit (INDEPENDENT_AMBULATORY_CARE_PROVIDER_SITE_OTHER): Payer: BC Managed Care – PPO | Admitting: Cardiovascular Disease

## 2012-03-08 VITALS — BP 120/76 | HR 78 | Ht 61.0 in | Wt 171.8 lb

## 2012-03-08 DIAGNOSIS — E785 Hyperlipidemia, unspecified: Secondary | ICD-10-CM

## 2012-03-08 DIAGNOSIS — R002 Palpitations: Secondary | ICD-10-CM

## 2012-03-08 DIAGNOSIS — I4891 Unspecified atrial fibrillation: Secondary | ICD-10-CM

## 2012-03-08 MED ORDER — DILTIAZEM HCL ER COATED BEADS 180 MG PO CP24: 180.0000 mg | ORAL_CAPSULE | Freq: Every day | ORAL | Status: DC

## 2012-03-08 MED ORDER — ATORVASTATIN CALCIUM 10 MG PO TABS: 10.0000 mg | ORAL_TABLET | Freq: Every day | ORAL | Status: DC

## 2012-03-08 NOTE — Assessment & Plan Note (Signed)
We have encouraged continued exercise, careful diet management in an effort to lose weight. Stay on Lipitor

## 2012-03-08 NOTE — Progress Notes (Signed)
Patient ID: Ariana Wilson, female    DOB: 1961-04-10, 51 y.o.   MRN: [id number]  HPI Comments: Ariana Wilson is a very pleasant 51 year old woman with a history of paroxysmal atrial fibrillation who has been managed on diltiazem XRT 240 mg daily with little breakthrough arrhythmia. She presents for routine followup.Her last episode of atrial fibrillation was November 2009. At that time, her rhythm converted to sinus without cardioversion.   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 She denies any significant tachycardia or palpitations. She has been biking, vacationing with no symptoms. No significant shortness breath or chest discomfort. She is currently taking diltiazem 180 mg daily with no greater arrhythmia. Weight is up several pounds. She reports that her brother passed away from heart attack recently. He was in his 16s and had a smoking history.  EKG shows normal sinus rhythm with a rate of 68 beats per minute with no significant ST or T wave changes    Outpatient Encounter Prescriptions as of 03/08/2012  Medication Sig Dispense Refill  . aspirin 81 MG tablet Take 81 mg by mouth daily.        [name] atorvastatin (LIPITOR) 10 MG tablet Take 1 tablet (10 mg total) by mouth daily.  90 tablet  3  . Calcium Carbonate-Vitamin D (CALCIUM + D) 600-200 MG-UNIT TABS Take 1 tablet by mouth daily.        . Cholecalciferol (VITAMIN D3) 5000 UNITS TABS Take 5,000 Units by mouth 1 dose over 24 hours.        . Cyanocobalamin (VITAMIN B-12) 5000 MCG SUBL Place 1 tablet under the tongue daily.        [name] diltiazem (CARDIZEM CD) 180 MG 24 hr capsule Take 1 capsule (180 mg total) by mouth daily.  90 capsule  3  . estrogens, conjugated, (PREMARIN) 0.625 MG tablet Take 0.625 mg by mouth daily. Take daily for 21 days then do not take for 7 days.       . fexofenadine (ALLEGRA) 180 MG tablet Take 180 mg by mouth daily.        . Fiber Complete TABS Take 2 tablets by mouth daily.        .  Nutritional Supplements (DHEA) 15-50 MG CAPS Take 1 capsule by mouth daily.        . Omega-3 Fatty Acids (FISH OIL) 1200 MG CAPS Take by mouth daily.        Review of Systems  Constitutional: Negative.   HENT: Negative.   Eyes: Negative.   Respiratory: Negative.   Cardiovascular: Negative.   Gastrointestinal: Negative.   Musculoskeletal: Negative.   Skin: Negative.   Neurological: Negative.   Hematological: Negative.   Psychiatric/Behavioral: Negative.   All other systems reviewed and are negative.    BP 120/76  Pulse 78  Ht 5\' 1"  (1.549 m)  Wt 171 lb 12 oz (77.905 kg)  BMI 32.45 kg/m2  Physical Exam  Nursing note and vitals reviewed. Constitutional: She is oriented to person, place, and time. She appears well-developed and well-nourished.       obese  HENT:  Head: Normocephalic.  Nose: Nose normal.  Mouth/Throat: Oropharynx is clear and moist.  Eyes: Conjunctivae are normal. Pupils are equal, round, and reactive to light.  Neck: Normal range of motion. Neck supple. No JVD present.  Cardiovascular: Normal rate, regular rhythm, S1 normal, S2 normal, normal heart sounds and intact distal pulses.  Exam reveals no gallop and no friction rub.  No murmur heard. Pulmonary/Chest: Effort normal and breath sounds normal. No respiratory distress. She has no wheezes. She has no rales. She exhibits no tenderness.  Abdominal: Soft. Bowel sounds are normal. She exhibits no distension. There is no tenderness.  Musculoskeletal: Normal range of motion. She exhibits no edema and no tenderness.  Lymphadenopathy:    She has no cervical adenopathy.  Neurological: She is alert and oriented to person, place, and time. Coordination normal.  Skin: Skin is warm and dry. No rash noted. No erythema.  Psychiatric: She has a normal mood and affect. Her behavior is normal. Judgment and thought content normal.         Assessment and Plan

## 2012-03-08 NOTE — Patient Instructions (Addendum)
You are doing well. No medication changes were made.  Please call us if you have new issues that need to be addressed before your next appt.  Your physician wants you to follow-up in: 12 months.  You will receive a reminder letter in the mail two months in advance. If you don't receive a letter, please call our office to schedule the follow-up appointment. 

## 2012-03-08 NOTE — Assessment & Plan Note (Signed)
We have suggested she stay on diltiazem 180 mg daily

## 2012-08-24 ENCOUNTER — Telehealth: Payer: Self-pay

## 2012-08-27 NOTE — Telephone Encounter (Signed)
error 

## 2012-12-07 ENCOUNTER — Ambulatory Visit: Payer: Self-pay | Admitting: Family Medicine

## 2012-12-14 ENCOUNTER — Ambulatory Visit: Payer: Self-pay | Admitting: Family Medicine

## 2013-01-04 ENCOUNTER — Other Ambulatory Visit: Payer: Self-pay | Admitting: Cardiovascular Disease

## 2013-01-04 NOTE — Telephone Encounter (Signed)
Refilled Diltiazem sent to [facility]. 

## 2013-03-04 ENCOUNTER — Encounter: Payer: Self-pay | Admitting: Cardiovascular Disease

## 2013-03-04 ENCOUNTER — Ambulatory Visit (INDEPENDENT_AMBULATORY_CARE_PROVIDER_SITE_OTHER): Payer: BC Managed Care – PPO | Admitting: Cardiovascular Disease

## 2013-03-04 VITALS — BP 120/80 | HR 56 | Ht 61.0 in | Wt 164.0 lb

## 2013-03-04 DIAGNOSIS — E785 Hyperlipidemia, unspecified: Secondary | ICD-10-CM

## 2013-03-04 DIAGNOSIS — I4891 Unspecified atrial fibrillation: Secondary | ICD-10-CM

## 2013-03-04 NOTE — Patient Instructions (Addendum)
You are doing well. No medication changes were made.  Please call us if you have new issues that need to be addressed before your next appt.  Your physician wants you to follow-up in: 12 months.  You will receive a reminder letter in the mail two months in advance. If you don't receive a letter, please call our office to schedule the follow-up appointment. 

## 2013-03-04 NOTE — Progress Notes (Signed)
Patient ID: Ariana Wilson, female    DOB: Jan 23, 1961, 52 y.o.   MRN: [id number]  HPI Comments: Ariana Wilson is a very pleasant 52 year old woman with a history of paroxysmal atrial fibrillation who has been managed on diltiazem XR mg daily with little breakthrough arrhythmia. She presents for routine followup.Her last episode of atrial fibrillation was November 2009. At that time, her rhythm converted to sinus without cardioversion.   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 She denies any significant tachycardia or palpitations. She has the summer off and has been biking, vacationing with no symptoms. No significant shortness breath or chest discomfort. She is currently taking diltiazem 180 mg daily. She is starting to workout at the gym in an effort to lose weight.    brother passed away from heart attack. He was in his 70s and had a smoking history.  EKG shows normal sinus rhythm with a rate of 56 beats per minute with no significant ST or T wave changes    Outpatient Encounter Prescriptions as of 03/04/2013  Medication Sig Dispense Refill  . aspirin 81 MG tablet Take 81 mg by mouth daily.        . Calcium Carbonate-Vitamin D (CALCIUM + D) 600-200 MG-UNIT TABS Take 1 tablet by mouth daily.        . Cholecalciferol (VITAMIN D3) 5000 UNITS TABS Take 5,000 Units by mouth 1 dose over 24 hours.        . Cyanocobalamin (VITAMIN B-12) 5000 MCG SUBL Place 1 tablet under the tongue daily.        [name] diltiazem (CARDIZEM CD) 180 MG 24 hr capsule TAKE ONE CAPSULE BY MOUTH ONCE DAILY  90 capsule  3  . estrogens, conjugated, (PREMARIN) 0.625 MG tablet Take 0.625 mg by mouth daily. Take daily for 21 days then do not take for 7 days.       . fexofenadine (ALLEGRA) 180 MG tablet Take 180 mg by mouth daily.        . Fiber Complete TABS Take 2 tablets by mouth daily.        . Nutritional Supplements (DHEA) 15-50 MG CAPS Take 1 capsule by mouth daily.        . Omega-3 Fatty  Acids (FISH OIL) 1200 MG CAPS Take by mouth daily.         Review of Systems  Constitutional: Negative.   HENT: Negative.   Eyes: Negative.   Respiratory: Negative.   Cardiovascular: Negative.   Gastrointestinal: Negative.   Musculoskeletal: Negative.   Skin: Negative.   Neurological: Negative.   Psychiatric/Behavioral: Negative.   All other systems reviewed and are negative.    BP 120/80  Pulse 56  Ht 5\' 1"  (1.549 m)  Wt 164 lb (74.39 kg)  BMI 31 kg/m2  Physical Exam  Nursing note and vitals reviewed. Constitutional: She is oriented to person, place, and time. She appears well-developed and well-nourished.  obese  HENT:  Head: Normocephalic.  Nose: Nose normal.  Mouth/Throat: Oropharynx is clear and moist.  Eyes: Conjunctivae are normal. Pupils are equal, round, and reactive to light.  Neck: Normal range of motion. Neck supple. No JVD present.  Cardiovascular: Normal rate, regular rhythm, S1 normal, S2 normal, normal heart sounds and intact distal pulses.  Exam reveals no gallop and no friction rub.   No murmur heard. Pulmonary/Chest: Effort normal and breath sounds normal. No respiratory distress. She has no wheezes. She has no rales. She exhibits no  tenderness.  Abdominal: Soft. Bowel sounds are normal. She exhibits no distension. There is no tenderness.  Musculoskeletal: Normal range of motion. She exhibits no edema and no tenderness.  Lymphadenopathy:    She has no cervical adenopathy.  Neurological: She is alert and oriented to person, place, and time. Coordination normal.  Skin: Skin is warm and dry. No rash noted. No erythema.  Psychiatric: She has a normal mood and affect. Her behavior is normal. Judgment and thought content normal.    Assessment and Plan

## 2013-03-04 NOTE — Assessment & Plan Note (Signed)
We will check her lipids today. If cholesterol is high, would restart generic Lipitor 10 mg daily

## 2013-03-04 NOTE — Assessment & Plan Note (Signed)
No further episodes of atrial fibrillation. We'll continue current medications.

## 2013-03-05 LAB — LIPID PANEL
Chol/HDL Ratio: 4.8 ratio — ABNORMAL HIGH (ref 0.0–4.4)
Cholesterol, Total: 247 mg/dL — ABNORMAL HIGH (ref 100–199)
HDL: 51 mg/dL
LDL Calculated: 154 mg/dL — ABNORMAL HIGH (ref 0–99)
Triglycerides: 212 mg/dL — ABNORMAL HIGH (ref 0–149)
VLDL Cholesterol Cal: 42 mg/dL — ABNORMAL HIGH (ref 5–40)

## 2013-03-05 LAB — HEPATIC FUNCTION PANEL
ALT: 13 [IU]/L (ref 0–32)
AST: 12 [IU]/L (ref 0–40)
Albumin: 4.3 g/dL (ref 3.5–5.5)
Alkaline Phosphatase: 62 [IU]/L (ref 39–117)
Bilirubin, Direct: 0.11 mg/dL (ref 0.00–0.40)
Total Bilirubin: 0.5 mg/dL (ref 0.0–1.2)
Total Protein: 6.8 g/dL (ref 6.0–8.5)

## 2013-03-20 ENCOUNTER — Telehealth: Payer: Self-pay

## 2013-03-20 NOTE — Telephone Encounter (Signed)
Pt states she had TIA at [facility], last Sunday,  pt wanted to make Dr. Mariah Wilson aware, they put her on Simvastatin. Please call, pt has questions.

## 2013-03-20 NOTE — Telephone Encounter (Signed)
lmtcb

## 2013-03-25 ENCOUNTER — Telehealth: Payer: Self-pay | Admitting: *Deleted

## 2013-03-25 NOTE — Telephone Encounter (Signed)
Patient went to the ED in [facility] and the doctor there wants to put her on simvastation 20mg .Ariana Wilson..please advise

## 2013-03-26 NOTE — Telephone Encounter (Signed)
lmtcb

## 2013-03-28 ENCOUNTER — Encounter: Payer: Self-pay | Admitting: *Deleted

## 2013-03-28 ENCOUNTER — Telehealth: Payer: Self-pay

## 2013-03-28 NOTE — Telephone Encounter (Signed)
Called pt with lipid results.  Pt states [place] the beach and had to go to the hospital with TIA symptoms.  Pt states MD there started her on Zocor 20mg  QD.  Pt states she has just filled rx and is getting ready to start taking.  Pt was given a rx for 30 days with no refills.  Advised pt to call us for refills after she takes for a couple of weeks and knows she can tolerate.  Pt verbalized understanding. Will forward msg to Dr Cyndee Brightly.

## 2013-03-28 NOTE — Telephone Encounter (Signed)
Message copied by Migdalia Dk on Thu Mar 28, 2013  1:50 PM ------      Message from: Antonieta Iba      Created: Sun Mar 24, 2013  8:19 PM       Would restart generic lipitor 10 mg daily      recheck in 3 months      Cholesterol high,      ldl doubled without medication ------

## 2013-03-28 NOTE — Telephone Encounter (Signed)
Returned call to pt see telephone note 03/28/13 in [facility]

## 2013-07-18 ENCOUNTER — Other Ambulatory Visit: Payer: Self-pay

## 2013-12-20 ENCOUNTER — Other Ambulatory Visit: Payer: Self-pay | Admitting: Cardiovascular Disease

## 2014-03-04 ENCOUNTER — Other Ambulatory Visit: Payer: Self-pay

## 2014-03-04 DIAGNOSIS — E785 Hyperlipidemia, unspecified: Secondary | ICD-10-CM

## 2014-03-04 DIAGNOSIS — Z79899 Other long term (current) drug therapy: Secondary | ICD-10-CM

## 2014-03-07 ENCOUNTER — Ambulatory Visit (INDEPENDENT_AMBULATORY_CARE_PROVIDER_SITE_OTHER): Payer: BC Managed Care – PPO | Admitting: Cardiovascular Disease

## 2014-03-07 ENCOUNTER — Encounter: Payer: Self-pay | Admitting: Cardiovascular Disease

## 2014-03-07 VITALS — BP 110/80 | HR 68 | Ht 61.0 in | Wt 154.5 lb

## 2014-03-07 DIAGNOSIS — R002 Palpitations: Secondary | ICD-10-CM

## 2014-03-07 DIAGNOSIS — E785 Hyperlipidemia, unspecified: Secondary | ICD-10-CM

## 2014-03-07 DIAGNOSIS — I4891 Unspecified atrial fibrillation: Secondary | ICD-10-CM

## 2014-03-07 DIAGNOSIS — Z79899 Other long term (current) drug therapy: Secondary | ICD-10-CM

## 2014-03-07 NOTE — Progress Notes (Signed)
Patient ID: Ariana Wilson, female    DOB: 1961/09/09, 53 y.o.   MRN: [id number]  HPI Comments: Ariana Wilson is a very pleasant 53 year old woman with a history of paroxysmal atrial fibrillation who has been managed on diltiazem XR mg daily with little breakthrough arrhythmia. She presents for routine followup.Her last episode of atrial fibrillation was November 2009. At that time, her rhythm converted to sinus without cardioversion.   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 She is off for the summer and active with no complaints. Rare palpitations lasting less than 2 minutes, possibly one time per month. Recently lost her mother from a stroke.  No significant shortness breath or chest discomfort. She is currently taking diltiazem 180 mg daily.     brother passed away from heart attack. He was in his 73s and had a smoking history.  EKG shows normal sinus rhythm with a rate of 68 beats per minute with no significant ST or T wave changes    Outpatient Encounter Prescriptions as of 03/07/2014  Medication Sig  . aspirin 325 MG tablet Take 325 mg by mouth daily.  . Calcium Carbonate-Vitamin D (CALCIUM + D) 600-200 MG-UNIT TABS Take 1 tablet by mouth daily.    . Cholecalciferol (VITAMIN D3) 5000 UNITS TABS Take 5,000 Units by mouth 1 dose over 24 hours.    . Cyanocobalamin (VITAMIN B-12) 5000 MCG SUBL Place 1 tablet under the tongue daily.    [name] diltiazem (CARDIZEM CD) 180 MG 24 hr capsule TAKE ONE CAPSULE BY MOUTH EVERY DAY  . fexofenadine (ALLEGRA) 180 MG tablet Take 180 mg by mouth daily.    . Fiber Complete TABS Take 2 tablets by mouth daily.    [name] Lysine HCl (L-FORMULA LYSINE HCL) 500 MG TABS Take 500 mg by mouth daily.  . Magnesium Citrate 100 MG TABS Take 200 mg by mouth daily.  . naproxen (NAPROSYN) 500 MG tablet Take 500 mg by mouth as needed.   . NON FORMULARY menosense 3 tablets daily.  . Nutritional Supplements (DHEA) 15-50 MG CAPS Take 1 capsule by  mouth daily.   . Omega-3 Fatty Acids (FISH OIL) 1200 MG CAPS Take by mouth daily.   . simvastatin (ZOCOR) 20 MG tablet Take 20 mg by mouth every evening.  . traZODone (DESYREL) 50 MG tablet Take 50 mg by mouth as needed.   . venlafaxine XR (EFFEXOR-XR) 75 MG 24 hr capsule Take 75 mg by mouth daily with breakfast.     Review of Systems  Constitutional: Negative.   HENT: Negative.   Eyes: Negative.   Respiratory: Negative.   Cardiovascular: Negative.   Gastrointestinal: Negative.   Endocrine: Negative.   Musculoskeletal: Negative.   Skin: Negative.   Allergic/Immunologic: Negative.   Neurological: Negative.   Hematological: Negative.   Psychiatric/Behavioral: Negative.   All other systems reviewed and are negative.   BP 110/80  Pulse 68  Ht 5\' 1"  (1.549 m)  Wt 154 lb 8 oz (70.081 kg)  BMI 29.21 kg/m2  Physical Exam  Nursing note and vitals reviewed. Constitutional: She is oriented to person, place, and time. She appears well-developed and well-nourished.  obese  HENT:  Head: Normocephalic.  Nose: Nose normal.  Mouth/Throat: Oropharynx is clear and moist.  Eyes: Conjunctivae are normal. Pupils are equal, round, and reactive to light.  Neck: Normal range of motion. Neck supple. No JVD present.  Cardiovascular: Normal rate, regular rhythm, S1 normal, S2 normal, normal heart sounds and  intact distal pulses.  Exam reveals no gallop and no friction rub.   No murmur heard. Pulmonary/Chest: Effort normal and breath sounds normal. No respiratory distress. She has no wheezes. She has no rales. She exhibits no tenderness.  Abdominal: Soft. Bowel sounds are normal. She exhibits no distension. There is no tenderness.  Musculoskeletal: Normal range of motion. She exhibits no edema and no tenderness.  Lymphadenopathy:    She has no cervical adenopathy.  Neurological: She is alert and oriented to person, place, and time. Coordination normal.  Skin: Skin is warm and dry. No rash noted.  No erythema.  Psychiatric: She has a normal mood and affect. Her behavior is normal. Judgment and thought content normal.    Assessment and Plan

## 2014-03-07 NOTE — Assessment & Plan Note (Signed)
We'll check her cholesterol today. No recent lab work on simvastatin 20 mg daily

## 2014-03-07 NOTE — Assessment & Plan Note (Signed)
Minimal atrial fibrillation. No medication changes made 

## 2014-03-07 NOTE — Patient Instructions (Signed)
You are doing well. No medication changes were made.  Please call us if you have new issues that need to be addressed before your next appt.  Your physician wants you to follow-up in: 12 months.  You will receive a reminder letter in the mail two months in advance. If you don't receive a letter, please call our office to schedule the follow-up appointment. 

## 2014-03-08 LAB — LIPID PANEL
CHOLESTEROL TOTAL: 181 mg/dL (ref 100–199)
Chol/HDL Ratio: 3.9 ratio units (ref 0.0–4.4)
HDL: 46 mg/dL (ref >39)
LDL Calculated: 104 mg/dL — ABNORMAL HIGH (ref 0–99)
TRIGLYCERIDES: 156 mg/dL — AB (ref 0–149)
VLDL CHOLESTEROL CAL: 31 mg/dL (ref 5–40)

## 2014-03-08 LAB — HEPATIC FUNCTION PANEL
ALK PHOS: 69 IU/L (ref 39–117)
ALT: 24 IU/L (ref 0–32)
AST: 20 IU/L (ref 0–40)
Albumin: 4.6 g/dL (ref 3.5–5.5)
BILIRUBIN DIRECT: 0.16 mg/dL (ref 0.00–0.40)
TOTAL PROTEIN: 7.4 g/dL (ref 6.0–8.5)
Total Bilirubin: 0.8 mg/dL (ref 0.0–1.2)

## 2014-03-19 ENCOUNTER — Other Ambulatory Visit: Payer: Self-pay | Admitting: *Deleted

## 2014-03-19 MED ORDER — DILTIAZEM HCL ER COATED BEADS 180 MG PO CP24: ORAL_CAPSULE | ORAL | Status: DC

## 2014-03-19 MED ORDER — SIMVASTATIN 20 MG PO TABS: 20.0000 mg | ORAL_TABLET | Freq: Every evening | ORAL | Status: DC

## 2014-03-19 NOTE — Telephone Encounter (Signed)
Requested Prescriptions   Signed Prescriptions Disp Refills  . simvastatin (ZOCOR) 20 MG tablet 90 tablet 3    Sig: Take 1 tablet (20 mg total) by mouth every evening.    Authorizing Provider: Minna Merritts    Ordering User: Britt Bottom

## 2014-03-19 NOTE — Telephone Encounter (Signed)
Simvastatin 20mg   90 day supply

## 2014-07-14 ENCOUNTER — Encounter: Payer: Self-pay | Admitting: Cardiovascular Disease

## 2014-12-11 ENCOUNTER — Other Ambulatory Visit: Payer: Self-pay | Admitting: Cardiovascular Disease

## 2015-03-06 ENCOUNTER — Encounter: Payer: Self-pay | Admitting: Cardiovascular Disease

## 2015-03-06 ENCOUNTER — Ambulatory Visit (INDEPENDENT_AMBULATORY_CARE_PROVIDER_SITE_OTHER): Payer: BC Managed Care – PPO | Admitting: Cardiovascular Disease

## 2015-03-06 ENCOUNTER — Telehealth: Payer: Self-pay | Admitting: Cardiovascular Disease

## 2015-03-06 VITALS — BP 120/74 | HR 76 | Ht 61.0 in | Wt 152.8 lb

## 2015-03-06 DIAGNOSIS — E785 Hyperlipidemia, unspecified: Secondary | ICD-10-CM | POA: Diagnosis not present

## 2015-03-06 DIAGNOSIS — I4891 Unspecified atrial fibrillation: Secondary | ICD-10-CM

## 2015-03-06 DIAGNOSIS — R002 Palpitations: Secondary | ICD-10-CM

## 2015-03-06 NOTE — Progress Notes (Signed)
Patient ID: Ariana Wilson, female    DOB: May 12, 1961, 54 y.o.   MRN: [id number]  HPI Comments: Ariana Wilson is a very pleasant 54 year old woman with a history of paroxysmal atrial fibrillation who has been managed on diltiazem XR 180 mg daily with little breakthrough arrhythmia. She presents for routine followup  Of her atrial fibrillation.Her last episode of atrial fibrillation was November 2009. At that time, her rhythm converted to sinus without cardioversion.  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 She is off for the summer and active with no complaints.  Rare palpitations lasting less than 1 minutes  No significant shortness breath or chest discomfort. She is currently taking diltiazem 180 mg daily.   Weight has been trending upwards. Otherwise feels well  Tolerating low-dose simvastatin    brother passed away from heart attack. He was in his 77s and had a smoking history.  EKG shows normal sinus rhythm with a rate of 76 beats per minute with no significant ST or T wave changes   Allergies  Allergen Reactions  . Sulfonamide Derivatives Swelling    Tongue swelling    Current Outpatient Prescriptions on File Prior to Visit  Medication Sig Dispense Refill  . Calcium Carbonate-Vitamin D (CALCIUM + D) 600-200 MG-UNIT TABS Take 1 tablet by mouth daily.      [name] diltiazem (CARDIZEM CD) 180 MG 24 hr capsule TAKE ONE CAPSULE BY MOUTH EVERY DAY 90 capsule 3  . fexofenadine (ALLEGRA) 180 MG tablet Take 180 mg by mouth daily.      . Fiber Complete TABS Take 2 tablets by mouth daily.      [name] Lysine HCl (L-FORMULA LYSINE HCL) 500 MG TABS Take 500 mg by mouth daily.    . Magnesium Citrate 100 MG TABS Take 200 mg by mouth daily.    . naproxen (NAPROSYN) 500 MG tablet Take 500 mg by mouth as needed.     . Nutritional Supplements (DHEA) 15-50 MG CAPS Take 1 capsule by mouth daily.     . Omega-3 Fatty Acids (FISH OIL) 1200 MG CAPS Take by mouth daily.     .  simvastatin (ZOCOR) 20 MG tablet TAKE 1 TABLET BY MOUTH EVERY EVENING. 90 tablet 3  . traZODone (DESYREL) 50 MG tablet Take 50 mg by mouth as needed.     . venlafaxine XR (EFFEXOR-XR) 75 MG 24 hr capsule Take 75 mg by mouth daily with breakfast.      No current facility-administered medications on file prior to visit.    Past Medical History  Diagnosis Date  . Atrial fibrillation   . Palpitations     Hx of, 25 years ago  . Menopause   . Anemia   . Hyperlipidemia     Past Surgical History  Procedure Laterality Date  . Total abdominal hysterectomy    . Bilateral salpingoophorectomy    . Dilation and curettage of uterus  08/24/06  . Nose surgery  7/09    Social History  reports that she has never smoked. She has never used smokeless tobacco. She reports that she does not drink alcohol or use illicit drugs.  Family History family history includes Arrhythmia in her sister; Heart attack in her brother; Heart disease in her father and sister; Hyperlipidemia in her father and mother; Hypertension in her mother.   Review of Systems  Constitutional: Negative.   Respiratory: Negative.   Cardiovascular: Negative.   Gastrointestinal: Negative.   Musculoskeletal: Negative.  Skin: Negative.   Neurological: Negative.   Hematological: Negative.   Psychiatric/Behavioral: Negative.   All other systems reviewed and are negative.   BP 120/74 mmHg  Pulse 76  Ht 5\' 1"  (1.549 m)  Wt 152 lb 12 oz (69.287 kg)  BMI 28.88 kg/m2  Physical Exam  Constitutional: She is oriented to person, place, and time. She appears well-developed and well-nourished.  obese  HENT:  Head: Normocephalic.  Nose: Nose normal.  Mouth/Throat: Oropharynx is clear and moist.  Eyes: Conjunctivae are normal. Pupils are equal, round, and reactive to light.  Neck: Normal range of motion. Neck supple. No JVD present.  Cardiovascular: Normal rate, regular rhythm, S1 normal, S2 normal, normal heart sounds and intact  distal pulses.  Exam reveals no gallop and no friction rub.   No murmur heard. Pulmonary/Chest: Effort normal and breath sounds normal. No respiratory distress. She has no wheezes. She has no rales. She exhibits no tenderness.  Abdominal: Soft. Bowel sounds are normal. She exhibits no distension. There is no tenderness.  Musculoskeletal: Normal range of motion. She exhibits no edema or tenderness.  Lymphadenopathy:    She has no cervical adenopathy.  Neurological: She is alert and oriented to person, place, and time. Coordination normal.  Skin: Skin is warm and dry. No rash noted. No erythema.  Psychiatric: She has a normal mood and affect. Her behavior is normal. Judgment and thought content normal.    Assessment and Plan  Nursing note and vitals reviewed.

## 2015-03-06 NOTE — Patient Instructions (Addendum)
You are doing well. No medication changes were made.  We will draw labs today: liver, lipid  Research CT coronary calcium score  Please call us if you have new issues that need to be addressed before your next appt.  Your physician wants you to follow-up in: 12 months.  You will receive a reminder letter in the mail two months in advance. If you don't receive a letter, please call our office to schedule the follow-up appointment.

## 2015-03-06 NOTE — Assessment & Plan Note (Signed)
Repeat lipid panel ordered. Recommended weight loss , exercise program , continue simvastatin

## 2015-03-06 NOTE — Telephone Encounter (Signed)
Patient wants labs from today sent to pcp

## 2015-03-06 NOTE — Assessment & Plan Note (Signed)
Minimal atrial fibrillation. No medication changes made

## 2015-03-07 LAB — HEPATIC FUNCTION PANEL
ALBUMIN: 4.6 g/dL (ref 3.5–5.5)
ALT: 17 IU/L (ref 0–32)
AST: 18 IU/L (ref 0–40)
Alkaline Phosphatase: 53 IU/L (ref 39–117)
BILIRUBIN TOTAL: 0.6 mg/dL (ref 0.0–1.2)
Bilirubin, Direct: 0.15 mg/dL (ref 0.00–0.40)
Total Protein: 6.6 g/dL (ref 6.0–8.5)

## 2015-03-07 LAB — LIPID PANEL
Chol/HDL Ratio: 2.8 ratio units (ref 0.0–4.4)
Cholesterol, Total: 155 mg/dL (ref 100–199)
HDL: 56 mg/dL (ref >39)
LDL Calculated: 80 mg/dL (ref 0–99)
TRIGLYCERIDES: 96 mg/dL (ref 0–149)
VLDL Cholesterol Cal: 19 mg/dL (ref 5–40)

## 2015-04-02 ENCOUNTER — Other Ambulatory Visit: Payer: Self-pay | Admitting: *Deleted

## 2015-04-02 MED ORDER — DILTIAZEM HCL ER COATED BEADS 180 MG PO CP24: ORAL_CAPSULE | ORAL | Status: DC

## 2015-06-23 ENCOUNTER — Telehealth: Payer: Self-pay | Admitting: *Deleted

## 2015-06-23 ENCOUNTER — Ambulatory Visit (INDEPENDENT_AMBULATORY_CARE_PROVIDER_SITE_OTHER): Payer: BC Managed Care – PPO | Admitting: Sports Medicine

## 2015-06-23 ENCOUNTER — Ambulatory Visit: Payer: Self-pay

## 2015-06-23 ENCOUNTER — Encounter: Payer: Self-pay | Admitting: Sports Medicine

## 2015-06-23 VITALS — Ht 61.0 in | Wt 160.0 lb

## 2015-06-23 DIAGNOSIS — T8484XA Pain due to internal orthopedic prosthetic devices, implants and grafts, initial encounter: Secondary | ICD-10-CM

## 2015-06-23 DIAGNOSIS — M79671 Pain in right foot: Secondary | ICD-10-CM

## 2015-06-23 NOTE — Progress Notes (Signed)
Subjective:    Patient ID: Ariana Wilson, female    DOB: 1960-12-31, 54 y.o.   MRN: [id number]  HPI: Ariana Wilson, 54 year old female patient present to office today as a walk-in new patient complaining of right foot pain; states that she had bunion surgery done in 2002 with Dr. Myer Peer, a podiatrist in [facility], [place] and now the hardware at the site is prominent and painful. States that over the last year the hardware has become more painful with direct touch or shoes which aggravate it. Patient states that she desires the screw to be removed. Patient states that she has heard great things about Dr. Milinda Pointer and prefers to have surgery with him. Patient denies any related consitutional symptoms or other pedal complaints at this time.  Patient is unsure of the type of hardware in right foot and if Dr. Myer Peer is still in practice or if records can be obtained from her surgery in 2002. Patient also admits to being a current patient of Dr. Vickki Muff however does not want to continue care with him.   Patient Active Problem List   Diagnosis Date Noted  . Anemia   . Hyperlipidemia   . Menopause   . Hyperlipemia 03/07/2011  . ATRIAL FIBRILLATION 02/14/2009  . PALPITATIONS 02/14/2009   Current Outpatient Prescriptions on File Prior to Visit  Medication Sig Dispense Refill  . aspirin 81 MG tablet Take 81 mg by mouth daily.    [name] buPROPion (WELLBUTRIN) 75 MG tablet Take 75 mg by mouth daily.    . Calcium Carbonate-Vitamin D (CALCIUM + D) 600-200 MG-UNIT TABS Take 1 tablet by mouth daily.      . Cyanocobalamin (VITAMIN B 12 PO) daily.    [name] diltiazem (CARDIZEM CD) 180 MG 24 hr capsule TAKE ONE CAPSULE BY MOUTH EVERY DAY 90 capsule 3  . fexofenadine (ALLEGRA) 180 MG tablet Take 180 mg by mouth daily.      . Fiber Complete TABS Take 2 tablets by mouth daily.      [name] Lysine HCl (L-FORMULA LYSINE HCL) 500 MG TABS Take 500 mg by mouth daily.    . Magnesium Citrate 100 MG TABS Take 200 mg by mouth daily.     . naproxen (NAPROSYN) 500 MG tablet Take 500 mg by mouth as needed.     . Nutritional Supplements (DHEA) 15-50 MG CAPS Take 1 capsule by mouth daily.     . Omega-3 Fatty Acids (FISH OIL) 1200 MG CAPS Take by mouth daily.     . simvastatin (ZOCOR) 20 MG tablet TAKE 1 TABLET BY MOUTH EVERY EVENING. 90 tablet 3  . traZODone (DESYREL) 50 MG tablet Take 50 mg by mouth as needed.     . venlafaxine XR (EFFEXOR-XR) 75 MG 24 hr capsule Take 75 mg by mouth daily with breakfast.     . vitamin B-12 (CYANOCOBALAMIN) 1000 MCG tablet Take 1,000 mcg by mouth daily.     No current facility-administered medications on file prior to visit.   Allergies  Allergen Reactions  . Sulfonamide Derivatives Swelling    Tongue swelling   Review of Systems  HENT:       Sinus problems  Cardiovascular:       A fib   All other systems reviewed and are negative.      Objective:   Physical Exam  Objective:  General: Well developed, nourished, in no acute distress, alert and oriented x3   Dermatological: Skin is warm, dry and supple  bilateral. Nails x 10 are well manicured; remaining integument appears unremarkable at this time. There are no open sores, no preulcerative lesions, no rash or signs of infection present.  Vascular: Dorsalis Pedis artery and Posterior Tibial artery pedal pulses are 2/4 bilateral with immedate capillary fill time. Pedal hair growth present. No varicosities and no lower extremity edema present bilateral.   Neruologic: Grossly intact via light touch bilateral. Vibratory intact via tuning fork bilateral. Protective threshold with Semmes Wienstein monofilament intact to all pedal sites bilateral.  No Babinski or clonus noted bilateral.   Musculoskeletal:Residual bunion/hallux valgus deformity noted on right with palpable screw head 1st metatarsal with mild tenderness . No pain, crepitus, or limitation noted with foot and ankle range of motion bilateral. Muscular strength 5/5 in all groups  tested bilateral.  Gait: Unassisted, Nonantalgic.   X-rays, 3 views, right foot: prominent screw head with a cruciform pattern at the 1st metatarsal with minimal lucency, no boney erosions, no fracture/disclocation, mild hallux interphalangeus present, asymptomatic enthesopathy noted at inferior and posterior calcaneus, all other joint spaces well preserved, no soft tissue swelling.       Assessment & Plan:   Problem List Items Addressed This Visit    None    Visit Diagnoses    Right foot pain    -  Primary    Secondary to hardware at 1st metatarsal, previous bunionectomy in 2002    Relevant Orders    DG Foot Complete Right    Painful orthopaedic hardware, initial encounter        right foot 1st metatarsal       -Complete examination performed -X-rays reviewed with findings as noted above -Discussed treatment options, risks, benefits, alternatives for painful hardware; discussed with patient due to type of hardware and past medical history that removal of hardware should be performed at Surgical center; patient elects to have hardware removed however would like to have it done with Dr. Milinda Pointer. -As a courtesy to patient the co-pay that was collected at today's visit will be applied to next visit/surgery consult with Dr. Milinda Pointer; Case discussed with Tammy.  -Patient to return for Surgery consult with Dr. Milinda Pointer or sooner if problems arise.  Landis Martins, DPM

## 2015-06-23 NOTE — Telephone Encounter (Addendum)
Ariana Wilson states she is not a pt, but may become pt of Dr. Milinda Pointer, but has questions.  Left message stating if she had questions it may be to her best advantage to make an appt to discuss with DR. Hyatt.

## 2015-06-29 ENCOUNTER — Encounter: Payer: Self-pay | Admitting: Podiatry

## 2015-06-29 ENCOUNTER — Ambulatory Visit (INDEPENDENT_AMBULATORY_CARE_PROVIDER_SITE_OTHER): Payer: BC Managed Care – PPO | Admitting: Podiatry

## 2015-06-29 DIAGNOSIS — T847XXD Infection and inflammatory reaction due to other internal orthopedic prosthetic devices, implants and grafts, subsequent encounter: Secondary | ICD-10-CM

## 2015-06-30 NOTE — Progress Notes (Signed)
She presents today requesting removal of the painful screw first metatarsophalangeal joint right foot. She had a bunion surgery 10+ years ago and states that the screw appears to be backing out. She has seen Dr. Cannon Kettle regarding this but would like for me to perform surgery. She states it is painful with shoe gear and seems to be getting worse all the time this is starting to limit her from her daily activities as well as the types of shoes that she can wear.  Objective: I reviewed her past medical history medications allergies surgeries a social history. Pulses are strongly palpable. Neurologic sensorium is intact. Deep tendon reflexes are intact. Muscle strength is 5 over 5 dorsiflexion plantar flexors and inverters everters OF the musculature is intact. Orthopedic evaluation demonstrates palpable screw first metatarsal head right foot. Radiographs were reviewed demonstrating prominent screw.  Assessment: Painful internal fixation screw first metatarsal right foot status post bunion repair.  Plan: We discussed the etiology pathology conservative versus surgical therapies. At this point she was consented today for surgical removal of screw first metatarsal right foot. I answered all the questions regarding this procedure to the best of my ability and limbs terms. She understood this was amenable to it and find all 3 phases of the consent form. We will follow-up with her in the near future for surgical intervention.

## 2015-07-02 ENCOUNTER — Telehealth: Payer: Self-pay | Admitting: *Deleted

## 2015-07-02 NOTE — Telephone Encounter (Signed)
Dr. Milinda Pointer wanted me to give you a call to set up surgery.  Can you do it on November 4th or 10th.  "November 10th sounds better.  Let's schedule it then."  Okay, remember not to eat or drink anything 6-8 hours before your surgery.  Go ahead and register with the surgical center.  "Do you know what time?"  It will be sometime that afternoon.  The surgical center will call you a day or 2 prior to with the exact time.

## 2015-07-02 NOTE — Telephone Encounter (Signed)
"  Can we move my surgery to 07/17/2015 instead of the 07/23/2015.  There's several people at work that day that are off."  I'll have to check with him and see if it is okay.  He has a full schedule.  "If I don't answer, leave me a message."  I left her a message that Dr. Milinda Pointer said it's okay to switch surgery date to 07/17/2015.  I called and rescheduled at the surgical center as well.

## 2015-07-16 ENCOUNTER — Other Ambulatory Visit: Payer: Self-pay | Admitting: Podiatry

## 2015-07-16 MED ORDER — OXYCODONE-ACETAMINOPHEN 10-325 MG PO TABS
ORAL_TABLET | ORAL | Status: DC
Start: 2015-07-16 — End: 2016-10-04

## 2015-07-16 MED ORDER — PROMETHAZINE HCL 25 MG PO TABS: 25.0000 mg | ORAL_TABLET | Freq: Three times a day (TID) | ORAL | Status: DC | PRN

## 2015-07-16 MED ORDER — CEPHALEXIN 500 MG PO CAPS: 500.0000 mg | ORAL_CAPSULE | Freq: Three times a day (TID) | ORAL | Status: DC

## 2015-07-17 ENCOUNTER — Encounter: Payer: Self-pay | Admitting: Podiatry

## 2015-07-17 DIAGNOSIS — Z4889 Encounter for other specified surgical aftercare: Secondary | ICD-10-CM | POA: Diagnosis not present

## 2015-07-22 ENCOUNTER — Ambulatory Visit (INDEPENDENT_AMBULATORY_CARE_PROVIDER_SITE_OTHER): Payer: BC Managed Care – PPO

## 2015-07-22 ENCOUNTER — Encounter: Payer: Self-pay | Admitting: Podiatry

## 2015-07-22 ENCOUNTER — Ambulatory Visit (INDEPENDENT_AMBULATORY_CARE_PROVIDER_SITE_OTHER): Payer: BC Managed Care – PPO | Admitting: Podiatry

## 2015-07-22 VITALS — BP 128/78 | HR 84 | Resp 18

## 2015-07-22 DIAGNOSIS — M79671 Pain in right foot: Secondary | ICD-10-CM | POA: Diagnosis not present

## 2015-07-22 DIAGNOSIS — Z9889 Other specified postprocedural states: Secondary | ICD-10-CM

## 2015-07-23 NOTE — Progress Notes (Signed)
She presents today for her first postop visit regarding screw removal first metatarsal right foot. She denies fever chills nausea vomiting muscle aches and pains.  Objective: Vital signs are stable she is alert and oriented 3 dry sterile dressing intact once removed demonstrates no erythema edema cellulitis drainage or odor. Sutures to the first metatarsal are intact margins appear to be coapting very well. No signs of infection. Radiographs demonstrate screw removal with no fractures.  Assessment: 1 healing surgical foot right.  Plan: A very light weight dressing placed about the forefoot right I will allow her to start getting this wet and we will remove the sutures in one week. Once his sutures are removed she is ready to get back into her regular shoe gear.

## 2015-07-29 ENCOUNTER — Ambulatory Visit: Payer: BC Managed Care – PPO

## 2015-07-29 ENCOUNTER — Ambulatory Visit (INDEPENDENT_AMBULATORY_CARE_PROVIDER_SITE_OTHER): Payer: BC Managed Care – PPO | Admitting: Podiatry

## 2015-07-29 ENCOUNTER — Encounter: Payer: Self-pay | Admitting: Podiatry

## 2015-07-29 VITALS — BP 102/57 | HR 81 | Resp 16

## 2015-07-29 DIAGNOSIS — Z9889 Other specified postprocedural states: Secondary | ICD-10-CM

## 2015-07-29 DIAGNOSIS — T847XXD Infection and inflammatory reaction due to other internal orthopedic prosthetic devices, implants and grafts, subsequent encounter: Secondary | ICD-10-CM

## 2015-07-29 NOTE — Progress Notes (Signed)
She presents today 2 weeks status post removal screw first metatarsal right foot. She states that is doing just great.  Objective: Vital signs stable alert and oriented 3. Margins well coapted sutures were removed today. Mild fluid collection beneath the incision site. I think this should go on to heal uneventfully I see no signs of infection.  Assessment: Well-healing surgical foot right.  Plan: Put a compression dressing on today follow up with me should this not resolve.

## 2015-07-31 ENCOUNTER — Other Ambulatory Visit: Payer: Self-pay

## 2015-07-31 ENCOUNTER — Encounter: Payer: Self-pay | Admitting: Sports Medicine

## 2015-08-12 ENCOUNTER — Encounter: Payer: Self-pay | Admitting: Podiatry

## 2015-09-02 NOTE — Progress Notes (Signed)
Patient ID: Ariana Wilson, female   DOB: Mar 02, 1961, 54 y.o.   MRN: [id number] Dr Milinda Pointer performed a removal of screw from 1st metatarsal right foot on 07/17/2015 at [facility]

## 2015-11-16 ENCOUNTER — Telehealth: Payer: Self-pay | Admitting: Cardiovascular Disease

## 2015-11-16 NOTE — Telephone Encounter (Signed)
Received records request [facility] , forwarded to [employer] for processing ° °

## 2015-11-23 NOTE — Telephone Encounter (Signed)
[facility] called to check on fax request .  Referred to [employer] for information.

## 2015-12-03 NOTE — Telephone Encounter (Signed)
Spole with Tiffany with [facility] about previously faxed paper questionaire faxed to office for life insurance Policy.  Made tiffany aware that this information would be sent to [employer] for completion.  She stated that this should not be the case  And asked to speak with an Glass blower/designer , nurse, or other staff member.   Offered to tx call to [employer] [facility] or him at main office in [place].  She preferred to be tx to main office .

## 2015-12-13 ENCOUNTER — Other Ambulatory Visit: Payer: Self-pay | Admitting: Cardiovascular Disease

## 2016-02-29 DIAGNOSIS — Z8249 Family history of ischemic heart disease and other diseases of the circulatory system: Secondary | ICD-10-CM | POA: Insufficient documentation

## 2016-03-08 ENCOUNTER — Ambulatory Visit: Payer: BC Managed Care – PPO | Admitting: Cardiovascular Disease

## 2016-03-08 DIAGNOSIS — M19049 Primary osteoarthritis, unspecified hand: Secondary | ICD-10-CM | POA: Insufficient documentation

## 2016-03-11 ENCOUNTER — Other Ambulatory Visit: Payer: Self-pay

## 2016-03-14 MED ORDER — DILTIAZEM HCL ER COATED BEADS 180 MG PO CP24: ORAL_CAPSULE | ORAL | Status: DC

## 2016-03-16 ENCOUNTER — Ambulatory Visit: Payer: Self-pay | Admitting: General Surgery

## 2016-03-17 ENCOUNTER — Other Ambulatory Visit: Payer: Self-pay | Admitting: Internal Medicine

## 2016-03-17 DIAGNOSIS — Z1231 Encounter for screening mammogram for malignant neoplasm of breast: Secondary | ICD-10-CM

## 2016-05-12 ENCOUNTER — Other Ambulatory Visit: Payer: Self-pay | Admitting: Internal Medicine

## 2016-05-12 ENCOUNTER — Ambulatory Visit
Admission: RE | Admit: 2016-05-12 | Discharge: 2016-05-12 | Disposition: A | Discharge status: Home / Self Care | Payer: BC Managed Care – PPO | Source: Ambulatory Visit | Attending: Internal Medicine | Admitting: Internal Medicine

## 2016-05-12 DIAGNOSIS — Z1231 Encounter for screening mammogram for malignant neoplasm of breast: Secondary | ICD-10-CM | POA: Diagnosis present

## 2016-06-08 ENCOUNTER — Telehealth: Payer: Self-pay | Admitting: Cardiovascular Disease

## 2016-06-08 NOTE — Telephone Encounter (Signed)
Pharmacist calling regarding drug interaction Simvistatin and Diltiazem. Please call.

## 2016-06-08 NOTE — Telephone Encounter (Signed)
Christi from [facility] just wanted to confirm patient medication of both simvastatin and diltiazem because it alerts as interaction. Confirmed with Leroy Kennedy that patient has been on these medications long-term. She verbalized understanding and reports that she processed them. She had no further questions and let her know to call back if I could be of any further assistance.

## 2016-06-10 ENCOUNTER — Other Ambulatory Visit: Payer: Self-pay | Admitting: Cardiovascular Disease

## 2016-06-23 ENCOUNTER — Telehealth: Payer: Self-pay | Admitting: Cardiovascular Disease

## 2016-06-23 NOTE — Telephone Encounter (Signed)
3 attempts to schedule appt from recall list. Patient says Dr. Sabra Heck pcp is managing care.  Deleting recall.

## 2016-09-07 ENCOUNTER — Telehealth: Payer: Self-pay | Admitting: Cardiovascular Disease

## 2016-09-07 MED ORDER — DILTIAZEM HCL 30 MG PO TABS: 30.0000 mg | ORAL_TABLET | ORAL | 1 refills | Status: DC | PRN

## 2016-09-07 NOTE — Telephone Encounter (Signed)
Spoke w/ pt.  She reports that she has not had an episode of afib in some time, but had an extended episode last night.  She was getting ready for bed around 9:30 and sx came on suddenly, she could feel her heart beating rapidly; could not get a reading or count manually, as it was jumping 106, 117, 95. She took an extra diltiazem 360 mg and sx resolved around 2:30 am. She reports that she has never had an episode last this long and would like to know what to do in case it happens again.  She does not have an "emergency plan" for breakthrough afib. She would like to have something on hand to take - pt does not have short acting diltiazem.  She is very tired today and has some soreness in her chest. Pt typically sees Dr. Rockey Situ on a yearly basis, but Dr. Sabra Heck advised that she would prescribe pt's meds and she did not need to see cardiology anymore, so pt has not been seen since March 2016. Pt does not feel comfortable w/ this and would like to resume yearly visits.  Advised her that I will make Dr. Rockey Situ aware of her concerns and call her back w/ his recommendation.

## 2016-09-07 NOTE — Telephone Encounter (Signed)
Pt calling stating last night she was in Afib for about 5 hours Is coming on 09/20/16 to see Dr Rockey Situ She is only on Diltiazem  She stated all she did was going to bed and it just kept pounding, usually last a couple minutes but this lasted a while. First time happening after a long time Was going to go ED but wanted to wait it out Would like some advise for it is a little far out  She is not having it right now, she is just very tired.  Please call

## 2016-09-07 NOTE — Telephone Encounter (Signed)
Okay to give diltiazem 30 mg pills Would recommend she reestablish in our clinic at least once per year

## 2016-09-07 NOTE — Telephone Encounter (Signed)
Spoke w/ pt.  Advised her that I am sending in diltiazem 30 mg tabs, to only take as needed for breakthrough afib. She verbalizes understanding and will keep her appt w/ Dr. Rockey Situ on 09/20/16. Asked her to call back w/ any further questions or concerns.

## 2016-09-20 ENCOUNTER — Ambulatory Visit: Payer: BC Managed Care – PPO | Admitting: Cardiovascular Disease

## 2016-09-30 ENCOUNTER — Ambulatory Visit: Payer: BC Managed Care – PPO | Admitting: Cardiovascular Disease

## 2016-10-04 ENCOUNTER — Ambulatory Visit (INDEPENDENT_AMBULATORY_CARE_PROVIDER_SITE_OTHER): Payer: BC Managed Care – PPO | Admitting: Cardiovascular Disease

## 2016-10-04 ENCOUNTER — Encounter: Payer: Self-pay | Admitting: Cardiovascular Disease

## 2016-10-04 VITALS — BP 128/76 | HR 64 | Ht 61.0 in | Wt 171.8 lb

## 2016-10-04 DIAGNOSIS — R002 Palpitations: Secondary | ICD-10-CM | POA: Diagnosis not present

## 2016-10-04 DIAGNOSIS — I480 Paroxysmal atrial fibrillation: Secondary | ICD-10-CM | POA: Diagnosis not present

## 2016-10-04 DIAGNOSIS — E782 Mixed hyperlipidemia: Secondary | ICD-10-CM

## 2016-10-04 MED ORDER — METOPROLOL TARTRATE 25 MG PO TABS: 25.0000 mg | ORAL_TABLET | Freq: Two times a day (BID) | ORAL | 5 refills | Status: DC | PRN

## 2016-10-04 NOTE — Progress Notes (Signed)
Cardiology Office Note  Date:  10/04/2016   ID:  Ariana Wilson, Ariana Wilson Dec 31, 1960, MRN [id number]  PCP:  Rusty Aus, MD   Chief Complaint  Patient presents with  . other    OD 12 month fu. Pt c/o fatigue. Reviewed meds with pt verbally.    HPI:  Ariana Wilson is a very pleasant 56 year old woman with a history of paroxysmal atrial fibrillation who has been managed on diltiazem XR 180 mg daily with little breakthrough arrhythmia. She presents for routine followup  Of her atrial fibrillation. episode of atrial fibrillation  November 2009. At that time, her rhythm converted to sinus without cardioversion.  She is active, works as a Radio producer In follow-up today she reports having 5 hours of atrial fibrillation presenting 09/06/2016 Denied any precipitating events She took her diltiazem 180 mg at 8 PM, A. fib started at 9 PM She took extra diltiazem 180 mg pill Took several hours for symptoms to resolve, resolved by 2 in the morning No further episodes prior to or since that time as far she can tell Heart rate varied between 70 and 120 bpm using pulse meter on her phone  Weight has been trending upwards over the past several years, up 10 pounds from prior clinic visit  No significant shortness breath or chest discomfort.  Tolerating low-dose simvastatin  Long discussion with her concerning her risk factors, need for anticoagulation  chads vasc1  EKG shows normal sinus rhythm with a rate of 64 beats per minute with no significant ST or T wave changes  Other past medical history reviewed  Ariana Wilson from heart attack. He was in his 46s and had a smoking history.   PMH:   has a past medical history of Anemia; Atrial fibrillation ([place]); Hyperlipidemia; Menopause; and Palpitations.  PSH:    Past Surgical History:  Procedure Laterality Date  . BILATERAL SALPINGOOPHORECTOMY    . DILATION AND CURETTAGE OF UTERUS  08/24/06  . NOSE SURGERY  7/09  . TOTAL ABDOMINAL HYSTERECTOMY       Current Outpatient Prescriptions  Medication Sig Dispense Refill  . aspirin 81 MG tablet Take 81 mg by mouth daily.    . Calcium Carbonate-Vitamin D (CALCIUM + D) 600-200 MG-UNIT TABS Take 1 tablet by mouth daily.      . Cholecalciferol (VITAMIN D PO) Take by mouth.    . Cyanocobalamin (VITAMIN B 12 PO) daily.    [name] diltiazem (CARDIZEM CD) 180 MG 24 hr capsule TAKE ONE CAPSULE BY MOUTH EVERY DAY 90 capsule 0  . diltiazem (CARDIZEM) 30 MG tablet Take 1 tablet (30 mg total) by mouth as needed (for afib/palpitations). 30 tablet 1  . fexofenadine (ALLEGRA) 180 MG tablet Take 180 mg by mouth daily.      . Fiber Complete TABS Take 2 tablets by mouth daily.      [name] Lysine HCl (L-FORMULA LYSINE HCL) 500 MG TABS Take 500 mg by mouth daily.    . Magnesium Citrate 100 MG TABS Take 200 mg by mouth daily.    . mometasone (NASONEX) 50 MCG/ACT nasal spray Place 2 sprays into the nose daily.    . naproxen (NAPROSYN) 500 MG tablet Take 500 mg by mouth as needed.     . Nutritional Supplements (DHEA) 15-50 MG CAPS Take 1 capsule by mouth daily.     . Omega-3 Fatty Acids (FISH OIL) 1200 MG CAPS Take by mouth daily.     . ranitidine (ZANTAC) 150 MG tablet Take  150 mg by mouth 2 (two) times daily.    . simvastatin (ZOCOR) 20 MG tablet TAKE 1 TABLET BY MOUTH EVERY EVENING. 90 tablet 3  . traZODone (DESYREL) 50 MG tablet Take 50 mg by mouth as needed.     . vitamin B-12 (CYANOCOBALAMIN) 1000 MCG tablet Take 1,000 mcg by mouth daily.    . metoprolol tartrate (LOPRESSOR) 25 MG tablet Take 1 tablet (25 mg total) by mouth 2 (two) times daily as needed. 60 tablet 5   No current facility-administered medications for this visit.      Allergies:   Sulfonamide derivatives   Social History:  The patient  reports that she has never smoked. She has never used smokeless tobacco. She reports that she does not drink alcohol or use drugs.   Ariana Wilson History:   Ariana Wilson history includes Arrhythmia in her sister; Heart attack  in her Ariana Wilson; Heart disease in her father and sister; Hyperlipidemia in her father and mother; Hypertension in her mother.    Review of Systems: Review of Systems  Constitutional: Negative.   Respiratory: Negative.   Cardiovascular: Positive for palpitations.  Gastrointestinal: Negative.   Musculoskeletal: Negative.   Neurological: Negative.   Psychiatric/Behavioral: Negative.   All other systems reviewed and are negative.    PHYSICAL EXAM: VS:  BP 128/76 (BP Location: Left Arm, Patient Position: Sitting, Cuff Size: Normal)   Pulse 64   Ht 5\' 1"  (1.549 m)   Wt 171 lb 12 oz (77.9 kg)   BMI 32.45 kg/m  , BMI Body mass index is 32.45 kg/m. GEN: Well nourished, well developed, in no acute distress, obese  HEENT: normal  Neck: no JVD, carotid bruits, or masses Cardiac: RRR; no murmurs, rubs, or gallops,no edema  Respiratory:  clear to auscultation bilaterally, normal work of breathing GI: soft, nontender, nondistended, + BS MS: no deformity or atrophy  Skin: warm and dry, no rash Neuro:  Strength and sensation are intact Psych: euthymic mood, full affect    Recent Labs: No results found for requested labs within last 8760 hours.    Lipid Panel Lab Results  Component Value Date   CHOL 155 03/06/2015   HDL 56 03/06/2015   LDLCALC 80 03/06/2015   TRIG 96 03/06/2015      Wt Readings from Last 3 Encounters:  10/04/16 171 lb 12 oz (77.9 kg)  06/23/15 160 lb (72.6 kg)  03/06/15 152 lb 12 oz (69.3 kg)       ASSESSMENT AND PLAN:  Paroxysmal atrial fibrillation (HCC) - Plan: EKG 12-Lead Long description of her symptoms provided by patient, likely having paroxysmal atrial fibrillation. Rare episodes, last known episode in 2009 Discussion concerning various treatment options, need for anticoagulation If she has recurrent episodes, recommended she take metoprolol tartrate with diltiazem 30 mg pill. Prescriptions provided. 4 frequent episodes, may need to start  antiarrhythmic CHADS VASC of 1. She does not want to start anticoagulation at this time Long discussion concerning risk and benefit of anticoagulation, risk of stroke from atrial fibrillation  Mixed hyperlipidemia Cholesterol is at goal on the current lipid regimen. No changes to the medications were made.  Palpitations Recent palpitations concerning for atrial fibrillation though no confirmation by EKG Recommended if she has additional episodes that she go to EMT substation or call our office for EKG. For recurrent symptoms we could order to week monitor  Morbid obesity ([place]) We have encouraged continued exercise, careful diet management in an effort to lose weight. Dietary recommendations made  Total encounter time more than 25 minutes  Greater than 50% was spent in counseling and coordination of care with the patient   Disposition:   F/U  12 months   Orders Placed This Encounter  Procedures  . EKG 12-Lead     Signed, Esmond Plants, M.D., Ph.D. 10/04/2016  [place], [place]

## 2016-10-04 NOTE — Patient Instructions (Addendum)
Research the CHADS VASC score Your score is 1  Medication Instructions:   No medication changes made  Please take metoprolol as needed with diltiazem 30 mg pills for breakthrough atrial fibrillation  Labwork:  No new labs needed  Testing/Procedures:  No further testing at this time   I recommend watching educational videos on topics of interest to you at:       [email or web address]  Enter code: [email or web address]    Follow-Up: It was a pleasure seeing you in the office today. Please call us if you have new issues that need to be addressed before your next appt.  [phone]  Your physician wants you to follow-up in: 12 months.  You will receive a reminder letter in the mail two months in advance. If you don't receive a letter, please call our office to schedule the follow-up appointment.  If you need a refill on your cardiac medications before your next appointment, please call your pharmacy.

## 2016-10-10 ENCOUNTER — Ambulatory Visit: Payer: BC Managed Care – PPO | Admitting: Cardiovascular Disease

## 2016-10-31 ENCOUNTER — Other Ambulatory Visit: Payer: Self-pay | Admitting: Cardiovascular Disease

## 2016-12-02 ENCOUNTER — Other Ambulatory Visit: Payer: Self-pay | Admitting: Cardiovascular Disease

## 2016-12-06 ENCOUNTER — Telehealth: Payer: Self-pay | Admitting: Cardiovascular Disease

## 2016-12-06 NOTE — Telephone Encounter (Signed)
Pt states last night she went into afib at 5:30 pm and lasted until 4 this morning. She has a question regarding her Diltiazem. States her HR was ranging from 150 to 80 to 60. She has some questions regarding this medication. Please call.

## 2016-12-06 NOTE — Telephone Encounter (Signed)
Spoke w/ pt.  She reports that she was watching TV last night around 5:30 when she felt that she went into afib.   She took 1 metoprolol 25 mg and 1 diltiazem 30 mg and tried to rest, but could not sleep. She felt that she converted to NSR around 4:30 am. She is calling to see if she needs to be seen, as Dr. Rockey Situ advised her to come over immediately if this happened again.  Advised her that he meant for an EKG, as we will need to document her rhythm while she is symptomatic.  Advised that that if sx recur, to take her metoprolol and diltiazem and try to relax.  If sx occur at night and she is nervous, she may call the after hours # and speak w/ a PA. If they occur during the day, asked her to call and see if she can come over for an EKG. Advised her that if sx occur more frequently, we can order a monitor. She is appreciative and will call back w/ any further questions or concerns.

## 2017-01-18 ENCOUNTER — Ambulatory Visit: Payer: BC Managed Care – PPO | Admitting: Podiatry

## 2017-04-11 ENCOUNTER — Other Ambulatory Visit: Payer: Self-pay | Admitting: Internal Medicine

## 2017-04-11 DIAGNOSIS — Z1231 Encounter for screening mammogram for malignant neoplasm of breast: Secondary | ICD-10-CM

## 2017-05-16 ENCOUNTER — Ambulatory Visit
Admission: RE | Admit: 2017-05-16 | Discharge: 2017-05-16 | Disposition: A | Discharge status: Home / Self Care | Payer: BC Managed Care – PPO | Source: Ambulatory Visit | Attending: Internal Medicine | Admitting: Internal Medicine

## 2017-05-16 DIAGNOSIS — Z1231 Encounter for screening mammogram for malignant neoplasm of breast: Secondary | ICD-10-CM | POA: Insufficient documentation

## 2017-07-04 DIAGNOSIS — E538 Deficiency of other specified B group vitamins: Secondary | ICD-10-CM | POA: Insufficient documentation

## 2017-07-04 DIAGNOSIS — D369 Benign neoplasm, unspecified site: Secondary | ICD-10-CM | POA: Insufficient documentation

## 2017-10-07 IMAGING — MG MM DIGITAL SCREENING BILAT W/ TOMO W/ CAD
8 of 12 series · 8 of 28 positions shown · non-contrast
Comparison: Previous exam(s).

CLINICAL DATA: Screening.

EXAM:
2D DIGITAL SCREENING BILATERAL MAMMOGRAM WITH CAD AND ADJUNCT TOMO

[L CC]
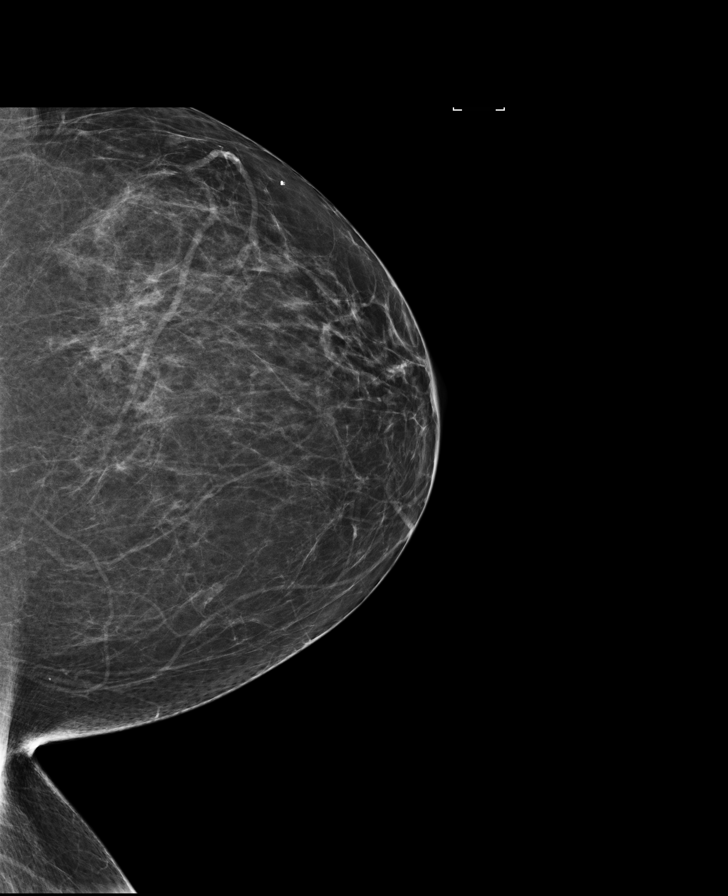

[R CC synth-2D]
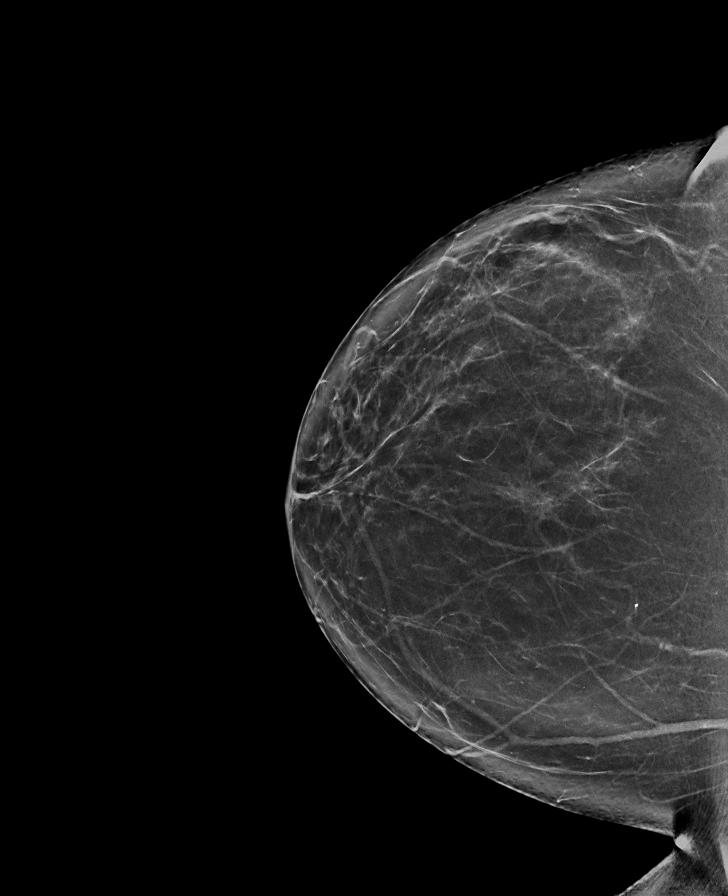

[R MLO]
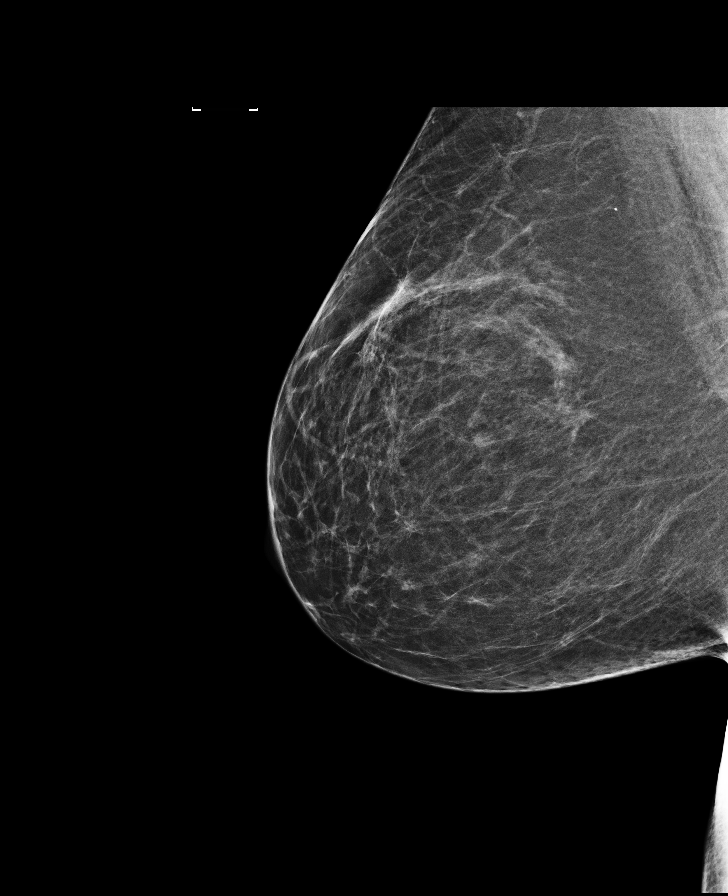

[L MLO]
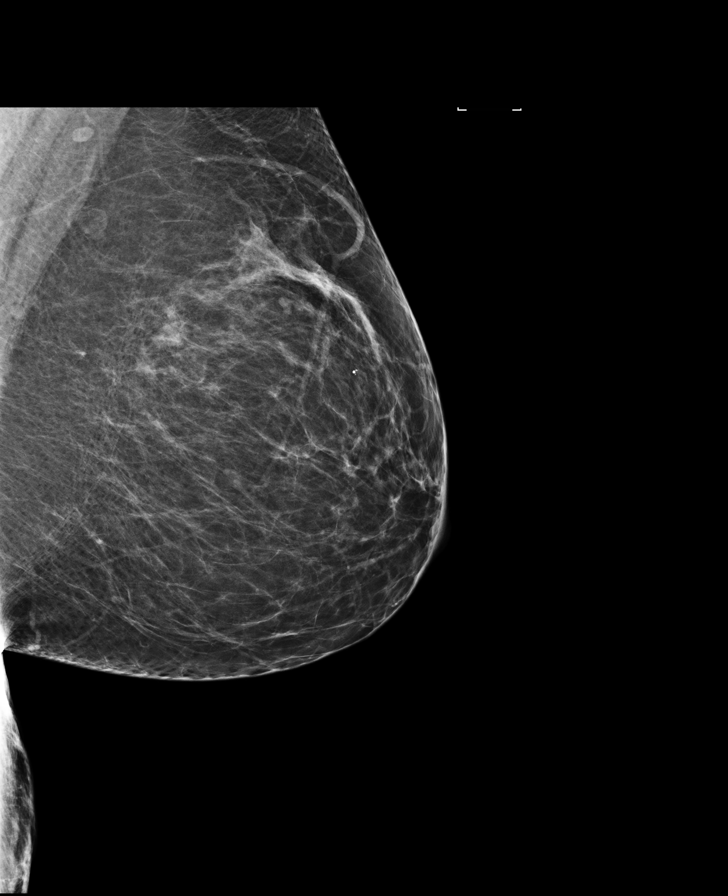

[R MLO synth-2D]
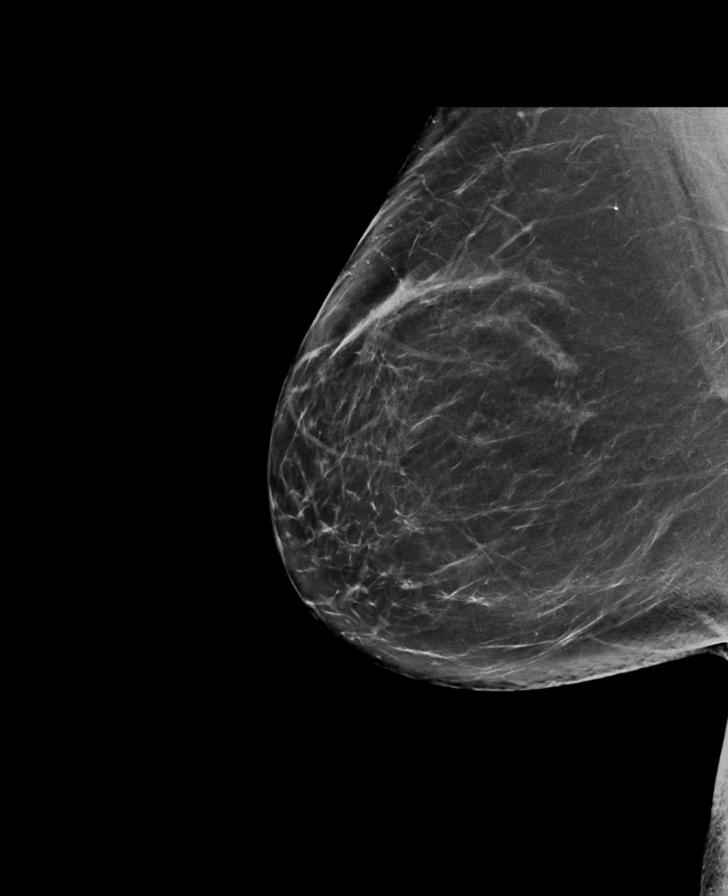

[R CC]
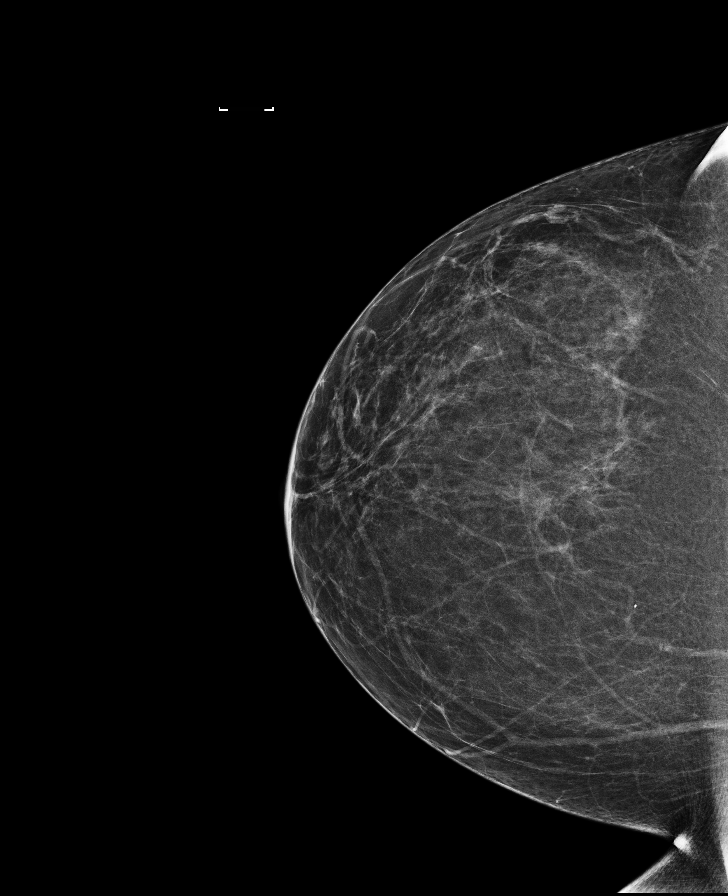

[L CC synth-2D]
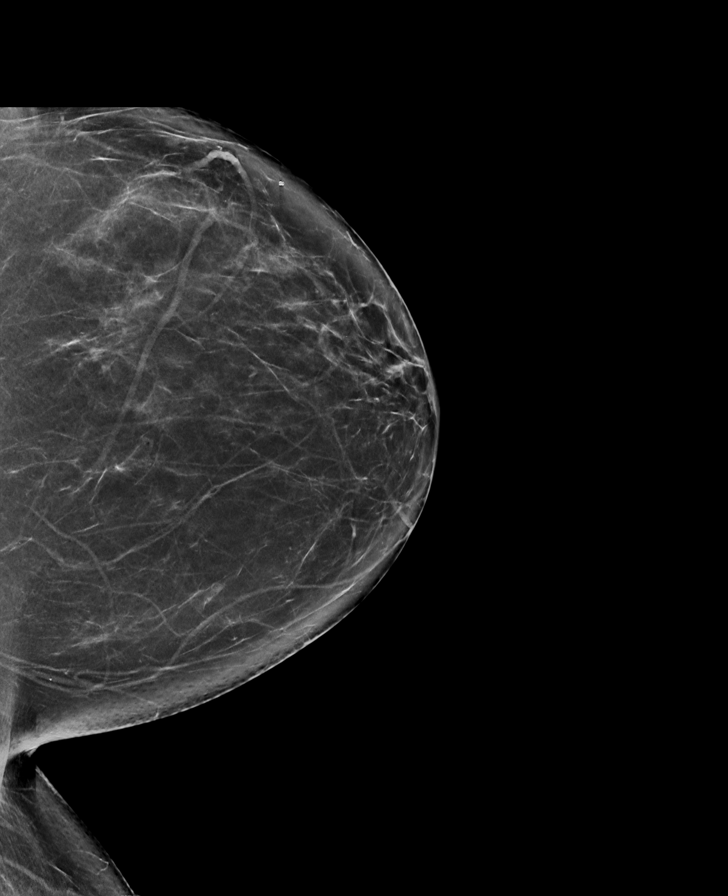

[L MLO synth-2D]
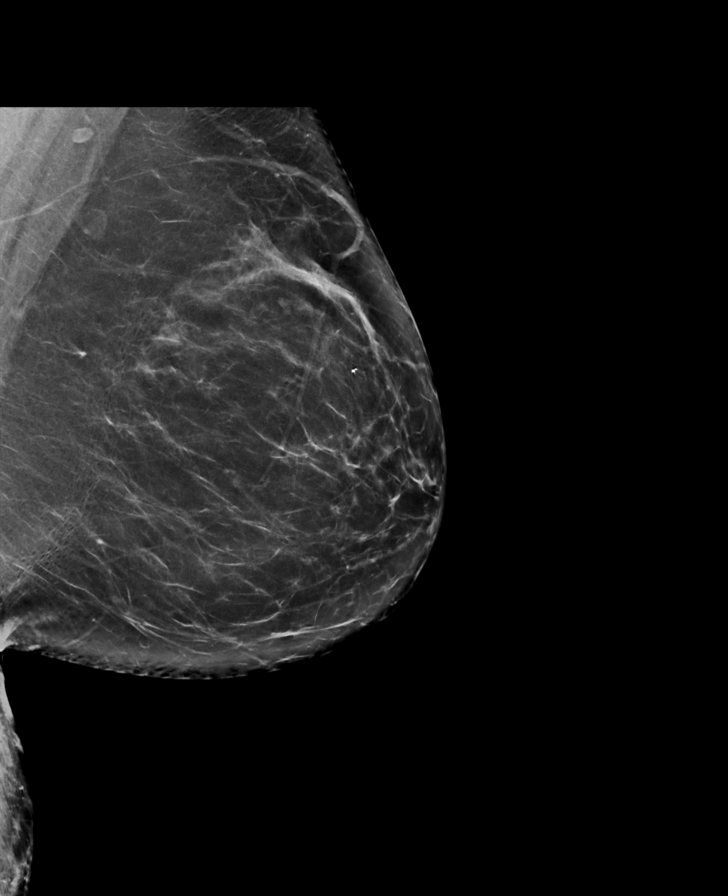

[8 of 28 positions shown; findings below may reference images not displayed]

ACR Breast Density Category b: There are scattered areas of
fibroglandular density.
FINDINGS: There are no findings suspicious for malignancy. Images were
processed with CAD.
IMPRESSION: No mammographic evidence of malignancy. A result letter of this
screening mammogram will be mailed directly to the patient.

RECOMMENDATION:
Screening mammogram in one year. (Code:97-6-RS4)

BI-RADS CATEGORY  1: Negative.

## 2017-11-20 ENCOUNTER — Other Ambulatory Visit: Payer: Self-pay | Admitting: Cardiovascular Disease

## 2017-12-23 ENCOUNTER — Other Ambulatory Visit: Payer: Self-pay | Admitting: Cardiovascular Disease

## 2017-12-28 ENCOUNTER — Other Ambulatory Visit: Payer: Self-pay | Admitting: *Deleted

## 2018-01-05 ENCOUNTER — Telehealth: Payer: Self-pay | Admitting: Cardiovascular Disease

## 2018-01-05 NOTE — Telephone Encounter (Signed)
-----   Message from Janan [place], [place] sent at 12/29/2017  2:06 PM EDT ----- Regarding: patient needs an appointment  Can you please try to schedule an appointment with Dr. Rockey Situ. Patient was last seen 10/04/2016. Patient has not followed up from that appointment.   Received a refill request from [facility] in [place] for Simvastatin 20MG  which was refilled 12/25/2017 30 tablets 0 refills with instructions to contact the office for a follow up appointment.   If patient does not want to make an appointment Primary Care Provider will need to refill.   Thank you!

## 2018-01-05 NOTE — Telephone Encounter (Signed)
lmov to schedule fu with Rockey Situ

## 2018-01-09 NOTE — Telephone Encounter (Signed)
Pt scheduled for 01/29/18 with Dr Rockey Situ

## 2018-01-27 DIAGNOSIS — I10 Essential (primary) hypertension: Secondary | ICD-10-CM | POA: Insufficient documentation

## 2018-01-27 NOTE — Progress Notes (Signed)
Cardiology Office Note  Date:  01/29/2018   ID:  Ariana, Wilson 06-27-1961, MRN [id number]  PCP:  Rusty Aus, MD   Chief Complaint  Patient presents with  . Other    12 month follow up. Patient denies chest pain and SOB. Meds reviewed verbally with patient.     HPI:  [name] is a 57 year old woman with a history of  paroxysmal atrial fibrillation dating back to 2009 chads vasc of 1 (female) managed on diltiazem XR 180 mg daily  She presents for routine followup  Of her atrial fibrillation.  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  Tolerating low-dose simvastatin  Long discussion with her concerning her risk factors, need for anticoagulation  chads vasc1  EKG personally reviewed by myself on todays visit Shows normal sinus rhythm rate 66 bpm no significant ST or T-wave changes  Other past medical history reviewed  brother passed away from heart attack. He was in his 34s and had a smoking history.   PMH:   has a past medical history of Anemia, Atrial fibrillation ([place]), Hyperlipidemia, Menopause, and Palpitations.  PSH:    Past Surgical History:  Procedure Laterality Date  . BILATERAL SALPINGOOPHORECTOMY    . DILATION AND CURETTAGE OF UTERUS  08/24/06  . NOSE SURGERY  7/09  . TOTAL ABDOMINAL HYSTERECTOMY      Current Outpatient Medications  Medication Sig Dispense Refill  . aspirin 81 MG tablet Take 81 mg by mouth daily.    . Calcium Carbonate-Vitamin D (CALCIUM + D) 600-200 MG-UNIT TABS Take 1 tablet by mouth daily.      . Cholecalciferol (VITAMIN D PO) Take by mouth.    . diltiazem (CARDIZEM CD) 180 MG 24 hr capsule TAKE ONE CAPSULE BY MOUTH EVERY DAY 90 capsule 0  . diltiazem (CARDIZEM) 30 MG  tablet TAKE 1 TABLET BY MOUTH AS NEEDED FOR AFIB/PALPITATIONS 30 tablet 2  . fexofenadine (ALLEGRA) 180 MG tablet Take 180 mg by mouth daily.      . Fiber Complete TABS Take 2 tablets by mouth daily.      [name] Lysine HCl (L-FORMULA LYSINE HCL) 500 MG TABS Take 500 mg by mouth daily.    . Magnesium Citrate 100 MG TABS Take 200 mg by mouth daily.    . mometasone (NASONEX) 50 MCG/ACT nasal spray Place 2 sprays into the nose daily.    . Nutritional Supplements (DHEA) 15-50 MG CAPS Take 1 capsule by mouth daily.     . Omega-3 Fatty Acids (FISH OIL) 1200 MG CAPS Take by mouth daily.     . ranitidine (ZANTAC) 150 MG tablet Take 150 mg by mouth 2 (two) times daily.    . simvastatin (ZOCOR) 20 MG tablet TAKE 1 TABLET BY MOUTH EVERY EVENING. 30 tablet 0  . traZODone (DESYREL) 50 MG tablet Take 50 mg by mouth as needed.     . vitamin B-12 (CYANOCOBALAMIN) 1000 MCG tablet Take 1,000 mcg by mouth daily.    . metoprolol tartrate (LOPRESSOR) 25 MG tablet Take 1 tablet (25 mg total) by mouth 2 (two) times daily as needed. 60 tablet 5   No current facility-administered medications for this visit.      Allergies:   Sulfonamide  derivatives   Social History:  The patient  reports that she has never smoked. She has never used smokeless tobacco. She reports that she does not drink alcohol or use drugs.   Family History:   family history includes Arrhythmia in her sister; Heart attack in her brother; Heart disease in her father and sister; Hyperlipidemia in her father and mother; Hypertension in her mother.    Review of Systems: Review of Systems  Constitutional: Negative.   Respiratory: Negative.   Cardiovascular: Negative.   Gastrointestinal: Negative.   Musculoskeletal: Negative.   Neurological: Negative.   Psychiatric/Behavioral: Negative.   All other systems reviewed and are negative.    PHYSICAL EXAM: VS:  BP 104/60 (BP Location: Left Arm, Patient Position: Sitting, Cuff Size: Normal)   Pulse 66    Ht 5\' 1"  (1.549 m)   Wt 167 lb 12 oz (76.1 kg)   BMI 31.70 kg/m  , BMI Body mass index is 31.7 kg/m. Constitutional:  oriented to person, place, and time. No distress.  HENT:  Head: Normocephalic and atraumatic.  Eyes:  no discharge. No scleral icterus.  Neck: Normal range of motion. Neck supple. No JVD present.  Cardiovascular: Normal rate, regular rhythm, normal heart sounds and intact distal pulses. Exam reveals no gallop and no friction rub. No edema No murmur heard. Pulmonary/Chest: Effort normal and breath sounds normal. No stridor. No respiratory distress.  no wheezes.  no rales.  no tenderness.  Abdominal: Soft.  no distension.  no tenderness.  Musculoskeletal: Normal range of motion.  no  tenderness or deformity.  Neurological:  normal muscle tone. Coordination normal. No atrophy Skin: Skin is warm and dry. No rash noted. not diaphoretic.  Psychiatric:  normal mood and affect. behavior is normal. Thought content normal.    Recent Labs: No results found for requested labs within last 8760 hours.    Lipid Panel Lab Results  Component Value Date   CHOL 155 03/06/2015   HDL 56 03/06/2015   LDLCALC 80 03/06/2015   TRIG 96 03/06/2015      Wt Readings from Last 3 Encounters:  01/29/18 167 lb 12 oz (76.1 kg)  10/04/16 171 lb 12 oz (77.9 kg)  06/23/15 160 lb (72.6 kg)       ASSESSMENT AND PLAN:  Paroxysmal atrial fibrillation (HCC) - Plan: EKG 12-Lead Rare episodes CHADS VASC of 1.  She does not want to start anticoagulation at this time Long discussion concerning risk and benefit of anticoagulation, risk of stroke from atrial fibrillation  Mixed hyperlipidemia Cholesterol is at goal on the current lipid regimen. No changes to the medications were made. Labs done through primary care, reviewed with her today  Encounter for anticoagulation discussion CHADS VASC calculated with her today, again she is not interested in anticoagulation Long discussion concerning  family history of strokes   Total encounter time more than 25 minutes  Greater than 50% was spent in counseling and coordination of care with the patient   Disposition:   F/U  12 months   Orders Placed This Encounter  Procedures  . EKG 12-Lead     Signed, Esmond Plants, M.D., Ph.D. 01/29/2018  [place], [place]

## 2018-01-29 ENCOUNTER — Encounter: Payer: Self-pay | Admitting: Cardiovascular Disease

## 2018-01-29 ENCOUNTER — Ambulatory Visit: Payer: BC Managed Care – PPO | Admitting: Cardiovascular Disease

## 2018-01-29 VITALS — BP 104/60 | HR 66 | Ht 61.0 in | Wt 167.8 lb

## 2018-01-29 DIAGNOSIS — I480 Paroxysmal atrial fibrillation: Secondary | ICD-10-CM

## 2018-01-29 DIAGNOSIS — I10 Essential (primary) hypertension: Secondary | ICD-10-CM | POA: Diagnosis not present

## 2018-01-29 DIAGNOSIS — E782 Mixed hyperlipidemia: Secondary | ICD-10-CM | POA: Diagnosis not present

## 2018-01-29 DIAGNOSIS — Z7189 Other specified counseling: Secondary | ICD-10-CM | POA: Insufficient documentation

## 2018-01-29 NOTE — Patient Instructions (Signed)

## 2018-02-01 ENCOUNTER — Other Ambulatory Visit: Payer: Self-pay | Admitting: Cardiovascular Disease

## 2018-04-13 ENCOUNTER — Other Ambulatory Visit: Payer: Self-pay | Admitting: Internal Medicine

## 2018-04-13 DIAGNOSIS — Z1231 Encounter for screening mammogram for malignant neoplasm of breast: Secondary | ICD-10-CM

## 2018-05-17 ENCOUNTER — Ambulatory Visit
Admission: RE | Admit: 2018-05-17 | Discharge: 2018-05-17 | Disposition: A | Discharge status: Home / Self Care | Payer: BC Managed Care – PPO | Source: Ambulatory Visit | Attending: Internal Medicine | Admitting: Internal Medicine

## 2018-05-17 DIAGNOSIS — Z1231 Encounter for screening mammogram for malignant neoplasm of breast: Secondary | ICD-10-CM | POA: Diagnosis present

## 2018-09-26 ENCOUNTER — Other Ambulatory Visit: Payer: Self-pay | Admitting: Cardiovascular Disease

## 2018-11-12 ENCOUNTER — Encounter: Payer: Self-pay | Admitting: Obstetrics and Gynecology

## 2018-11-12 ENCOUNTER — Other Ambulatory Visit (HOSPITAL_COMMUNITY)
Admission: RE | Admit: 2018-11-12 | Discharge: 2018-11-12 | Disposition: A | Discharge status: Home / Self Care | Payer: BC Managed Care – PPO | Source: Ambulatory Visit | Attending: Obstetrics and Gynecology | Admitting: Obstetrics and Gynecology

## 2018-11-12 ENCOUNTER — Ambulatory Visit (INDEPENDENT_AMBULATORY_CARE_PROVIDER_SITE_OTHER): Payer: BC Managed Care – PPO | Admitting: Obstetrics and Gynecology

## 2018-11-12 VITALS — BP 100/72 | HR 82 | Ht 60.0 in | Wt 167.0 lb

## 2018-11-12 DIAGNOSIS — Z124 Encounter for screening for malignant neoplasm of cervix: Secondary | ICD-10-CM | POA: Diagnosis not present

## 2018-11-12 DIAGNOSIS — N949 Unspecified condition associated with female genital organs and menstrual cycle: Secondary | ICD-10-CM

## 2018-11-12 DIAGNOSIS — Z1151 Encounter for screening for human papillomavirus (HPV): Secondary | ICD-10-CM | POA: Diagnosis present

## 2018-11-12 DIAGNOSIS — Z01419 Encounter for gynecological examination (general) (routine) without abnormal findings: Secondary | ICD-10-CM | POA: Diagnosis not present

## 2018-11-12 DIAGNOSIS — Z1239 Encounter for other screening for malignant neoplasm of breast: Secondary | ICD-10-CM

## 2018-11-12 DIAGNOSIS — N9489 Other specified conditions associated with female genital organs and menstrual cycle: Secondary | ICD-10-CM

## 2018-11-12 LAB — POCT WET PREP WITH KOH
CLUE CELLS WET PREP PER HPF POC: NEGATIVE
KOH Prep POC: NEGATIVE
TRICHOMONAS UA: NEGATIVE
Yeast Wet Prep HPF POC: NEGATIVE

## 2018-11-12 MED ORDER — CLOTRIMAZOLE-BETAMETHASONE 1-0.05 % EX CREA: TOPICAL_CREAM | CUTANEOUS | 0 refills | Status: DC

## 2018-11-12 NOTE — Patient Instructions (Addendum)
I value your feedback and entrusting us with your care. If you get a [facility] patient survey, I would appreciate you taking the time to let us know about your experience today. Thank you!  HEALTHY VAGINAL HYGIENE  AVOID   Panytyhose Synthetic underwear (wear COTTON underwear)  Tight pants/jeans Thongs Pantyliners Scented soaps/shower gels (use Dove Sensitive Skin soap or water to clean) Bubble bath/bath bombs Scented detergents  ALL dryer sheets (line dry underwear if using them on your other clothing) Feminine sprays/douches     

## 2018-11-12 NOTE — Progress Notes (Signed)
PCP: Rusty Aus, MD   Chief Complaint  Patient presents with  . Gynecologic Exam  . Vaginitis    recurrent yeast infections, itching, burning, no discharge/odor a few weeks ago    HPI:      [name] is a 58 y.o. G2P2 who LMP was No LMP recorded. Patient has had a hysterectomy., presents today for her NP> 3 yrs annual examination.  Her menses are absent due to lap supracx hyst with BSO 2008 with Dr. Ammie Dalton.  She does not have intermenstrual bleeding. She does not have vasomotor sx.   Sex activity: single partner, contraception - status post hysterectomy. She does have vaginal dryness/dysapareunia but has not tried lubricants.  She complains of recurrent yeast infections, usually a couple a year. Treated for yeast vag after going to Urgent Care 1/20 with 1 diflucan. Sx improved but have recurred. Has vaginal itching/irritation/burning, sometimes with d/c. No odor. Wears unscented pads daily but changes frequently. Uses different soap and Gain dryer sheets.   Last Pap: May 02, 2012  Results were: no abnormalities /neg HPV DNA.  Hx of STDs: none  Last mammogram: May 17, 2018  Results were: normal--routine follow-up in 12 months There is no FH of breast cancer. There is no FH of ovarian cancer. The patient does not do self-breast exams.  Colonoscopy: age 51 and 62 with polyps;  Repeat due after 5 years.   Tobacco use: The patient denies current or previous tobacco use. Alcohol use: none Exercise: moderately active  She does get adequate calcium and Vitamin D in her diet.  Labs with PCP.   Past Medical History:  Diagnosis Date  . Anemia   . Atrial fibrillation ([place])   . Hyperlipidemia   . Menopause   . Palpitations    Hx of, 25 years ago    Past Surgical History:  Procedure Laterality Date  . BILATERAL SALPINGOOPHORECTOMY  2008  . DILATION AND CURETTAGE OF UTERUS  08/24/06  . LAPAROSCOPIC SUPRACERVICAL HYSTERECTOMY  2008   Dr. Ammie Dalton; with BSO   . NOSE SURGERY  7/09    Family History  Problem Relation Age of Onset  . Heart disease Father   . Hyperlipidemia Father   . Hyperlipidemia Mother   . Hypertension Mother   . Heart attack Brother   . Arrhythmia Sister        Atrial fibrillation  . Heart disease Sister   . Breast cancer Neg Hx     Social History   Socioeconomic History  . Marital status: Married    Spouse name: Not on file  . Number of children: Not on file  . Years of education: Not on file  . Highest education level: Not on file  Occupational History  . Occupation: Full time  Social Needs  . Financial resource strain: Not on file  . Food insecurity:    Worry: Not on file    Inability: Not on file  . Transportation needs:    Medical: Not on file    Non-medical: Not on file  Tobacco Use  . Smoking status: Never Smoker  . Smokeless tobacco: Never Used  Substance and Sexual Activity  . Alcohol use: No  . Drug use: No  . Sexual activity: Yes    Birth control/protection: Surgical    Comment: Hysterectomy  Lifestyle  . Physical activity:    Days per week: Not on file    Minutes per session: Not on file  . Stress:  Not on file  Relationships  . Social connections:    Talks on phone: Not on file    Gets together: Not on file    Attends religious service: Not on file    Active member of club or organization: Not on file    Attends meetings of clubs or organizations: Not on file    Relationship status: Not on file  . Intimate partner violence:    Fear of current or ex partner: Not on file    Emotionally abused: Not on file    Physically abused: Not on file    Forced sexual activity: Not on file  Other Topics Concern  . Not on file  Social History Narrative   Married with 2 children   Gets regular exercise    Outpatient Medications Prior to Visit  Medication Sig Dispense Refill  . aspirin 81 MG tablet Take 81 mg by mouth daily.    . Calcium Carbonate-Vitamin D (CALCIUM + D) 600-200 MG-UNIT  TABS Take 1 tablet by mouth daily.      . Cholecalciferol (VITAMIN D PO) Take by mouth.    . diclofenac sodium (VOLTAREN) 1 % GEL APPLY 2 GRAMS TO AFFECTED AREA(S) TOPICALLY 4 TIMES A DAY    . diltiazem (CARDIZEM CD) 180 MG 24 hr capsule TAKE ONE CAPSULE BY MOUTH EVERY DAY 90 capsule 0  . diltiazem (CARDIZEM) 30 MG tablet TAKE 1 TABLET BY MOUTH AS NEEDED FOR AFIB/PALPITATIONS 90 tablet 0  . Lysine HCl (L-FORMULA LYSINE HCL) 500 MG TABS Take 500 mg by mouth daily.    . metoprolol tartrate (LOPRESSOR) 25 MG tablet TAKE 1 TABLET (25 MG TOTAL) BY MOUTH 2 (TWO) TIMES DAILY AS NEEDED. 60 tablet 5  . mometasone (NASONEX) 50 MCG/ACT nasal spray Place 2 sprays into the nose daily.    . Nutritional Supplements (DHEA) 15-50 MG CAPS Take 1 capsule by mouth daily.     . Omega-3 Fatty Acids (FISH OIL) 1200 MG CAPS Take by mouth daily.     [name] omeprazole (PRILOSEC) 20 MG capsule     . simvastatin (ZOCOR) 20 MG tablet TAKE 1 TABLET BY MOUTH EVERY EVENING. 30 tablet 0  . traZODone (DESYREL) 50 MG tablet Take 50 mg by mouth as needed.     . vitamin B-12 (CYANOCOBALAMIN) 1000 MCG tablet Take 1,000 mcg by mouth daily.    . fexofenadine (ALLEGRA) 180 MG tablet Take 180 mg by mouth daily.      . Fiber Complete TABS Take 2 tablets by mouth daily.      . Magnesium Citrate 100 MG TABS Take 200 mg by mouth daily.    . ranitidine (ZANTAC) 150 MG tablet Take 150 mg by mouth 2 (two) times daily.    . simvastatin (ZOCOR) 20 MG tablet TAKE 1 TABLET BY MOUTH EVERY EVENING. 90 tablet 3   No facility-administered medications prior to visit.        ROS:  Review of Systems  Constitutional: Negative for fatigue, fever and unexpected weight change.  Respiratory: Negative for cough, shortness of breath and wheezing.   Cardiovascular: Negative for chest pain, palpitations and leg swelling.  Gastrointestinal: Negative for blood in stool, constipation, diarrhea, nausea and vomiting.  Endocrine: Negative for cold intolerance,  heat intolerance and polyuria.  Genitourinary: Positive for dyspareunia and vaginal discharge. Negative for dysuria, flank pain, frequency, genital sores, hematuria, menstrual problem, pelvic pain, urgency, vaginal bleeding and vaginal pain.  Musculoskeletal: Negative for back pain, joint swelling and myalgias.  Skin: Negative for rash.  Neurological: Negative for dizziness, syncope, light-headedness, numbness and headaches.  Hematological: Negative for adenopathy.  Psychiatric/Behavioral: Negative for agitation, confusion, sleep disturbance and suicidal ideas. The patient is not nervous/anxious.    BREAST: No symptoms    Objective: BP 100/72   Pulse 82   Ht 5' (1.524 m)   Wt 167 lb (75.8 kg)   BMI 32.61 kg/m    Physical Exam Constitutional:      Appearance: She is well-developed.  Genitourinary:     Vulva, vagina, cervix, right adnexa and left adnexa normal.     No vulval lesion or tenderness noted.     No vaginal discharge, erythema or tenderness.     No cervical polyp.     Uterus is not tender.     Uterus is absent.     No right or left adnexal mass present.     Right adnexa not tender.     Left adnexa not tender.     Genitourinary Comments: UTERUS SURG ABSENT  Neck:     Musculoskeletal: Normal range of motion.     Thyroid: No thyromegaly.  Cardiovascular:     Rate and Rhythm: Normal rate and regular rhythm.     Heart sounds: Normal heart sounds. No murmur.  Pulmonary:     Effort: Pulmonary effort is normal.     Breath sounds: Normal breath sounds.  Chest:     Breasts:        Right: No mass, nipple discharge, skin change or tenderness.        Left: No mass, nipple discharge, skin change or tenderness.  Abdominal:     Palpations: Abdomen is soft.     Tenderness: There is no abdominal tenderness. There is no guarding.  Musculoskeletal: Normal range of motion.  Neurological:     Mental Status: She is alert and oriented to person, place, and time.     Cranial  Nerves: No cranial nerve deficit.  Psychiatric:        Behavior: Behavior normal.  Vitals signs reviewed.     Results: Results for orders placed or performed in visit on 11/12/18 (from the past 24 hour(s))  POCT Wet Prep with KOH     Status: Normal   Collection Time: 11/12/18 11:41 AM  Result Value Ref Range   Trichomonas, UA Negative    Clue Cells Wet Prep HPF POC neg    Epithelial Wet Prep HPF POC     Yeast Wet Prep HPF POC neg    Bacteria Wet Prep HPF POC     RBC Wet Prep HPF POC     WBC Wet Prep HPF POC     KOH Prep POC Negative Negative    Assessment/Plan:  Encounter for annual routine gynecological examination  Cervical cancer screening - Plan: Cytology - PAP  Screening for HPV (human papillomavirus) - Plan: Cytology - PAP  Screening for breast cancer - Pt current on mammo  Vaginal burning - Neg wet prep/exam. Question ext chem derm vs AV/yeast. Rx lotrisone crm. Dove sens skin soap/line dry underwear. MDL culture. Will call with results.  - Plan: clotrimazole-betamethasone (LOTRISONE) cream, POCT Wet Prep with KOH, Other/Misc lab test   Meds ordered this encounter  Medications  . clotrimazole-betamethasone (LOTRISONE) cream    Sig: Apply externally BID prn sx up to 2 wks    Dispense:  15 g    Refill:  0    Order Specific Question:   Supervising Provider  Answer:   Gae Dry [[id number]]            GYN counsel breast self exam, mammography screening, menopause, adequate intake of calcium and vitamin D, diet and exercise    F/U  Return in about 1 year (around 11/12/2019).  Alicia B. Copland, PA-C 11/12/2018 11:42 AM

## 2018-11-15 LAB — CYTOLOGY - PAP
DIAGNOSIS: NEGATIVE
HPV (WINDOPATH): NOT DETECTED

## 2018-11-19 ENCOUNTER — Telehealth: Payer: Self-pay | Admitting: Obstetrics and Gynecology

## 2018-11-19 NOTE — Telephone Encounter (Signed)
Pt aware of neg AV and yeast culture on One Swab. Vag sx improved with lotrisone crm, dove sens skin soap, line drying underwear. F/u prn.

## 2018-12-06 DIAGNOSIS — Q283 Other malformations of cerebral vessels: Secondary | ICD-10-CM | POA: Insufficient documentation

## 2018-12-07 DIAGNOSIS — N3000 Acute cystitis without hematuria: Secondary | ICD-10-CM | POA: Insufficient documentation

## 2018-12-07 DIAGNOSIS — R42 Dizziness and giddiness: Secondary | ICD-10-CM | POA: Insufficient documentation

## 2019-04-17 ENCOUNTER — Other Ambulatory Visit: Payer: Self-pay | Admitting: Internal Medicine

## 2019-04-17 DIAGNOSIS — Z1231 Encounter for screening mammogram for malignant neoplasm of breast: Secondary | ICD-10-CM

## 2019-06-03 ENCOUNTER — Telehealth: Payer: Self-pay | Admitting: Cardiovascular Disease

## 2019-06-03 NOTE — Telephone Encounter (Signed)
Deleting recall per patient request changed to [facility] cards Dr. Nehemiah Massed.  Faxed images other records available on [facility] .

## 2019-06-04 ENCOUNTER — Ambulatory Visit
Admission: RE | Admit: 2019-06-04 | Discharge: 2019-06-04 | Disposition: A | Discharge status: Home / Self Care | Payer: BC Managed Care – PPO | Source: Ambulatory Visit | Attending: Internal Medicine | Admitting: Internal Medicine

## 2019-06-04 DIAGNOSIS — Z1231 Encounter for screening mammogram for malignant neoplasm of breast: Secondary | ICD-10-CM | POA: Diagnosis not present

## 2019-06-22 ENCOUNTER — Encounter: Payer: Self-pay | Admitting: Obstetrics and Gynecology

## 2019-06-24 ENCOUNTER — Other Ambulatory Visit: Payer: Self-pay | Admitting: Obstetrics and Gynecology

## 2019-06-24 MED ORDER — MECLIZINE HCL 25 MG PO TABS: 25.0000 mg | ORAL_TABLET | Freq: Three times a day (TID) | ORAL | 0 refills | Status: DC | PRN

## 2019-06-24 NOTE — Progress Notes (Signed)
Rx RF meclizine for vertigo.

## 2019-06-25 ENCOUNTER — Ambulatory Visit: Payer: BC Managed Care – PPO | Admitting: Cardiovascular Disease

## 2019-08-13 ENCOUNTER — Other Ambulatory Visit: Payer: Self-pay | Admitting: Internal Medicine

## 2019-08-13 DIAGNOSIS — N6320 Unspecified lump in the left breast, unspecified quadrant: Secondary | ICD-10-CM

## 2019-08-19 ENCOUNTER — Ambulatory Visit
Admission: RE | Admit: 2019-08-19 | Discharge: 2019-08-19 | Disposition: A | Discharge status: Home / Self Care | Payer: BC Managed Care – PPO | Source: Ambulatory Visit | Attending: Internal Medicine | Admitting: Internal Medicine

## 2019-08-19 DIAGNOSIS — N6320 Unspecified lump in the left breast, unspecified quadrant: Secondary | ICD-10-CM | POA: Diagnosis not present

## 2019-11-09 ENCOUNTER — Ambulatory Visit: Payer: BC Managed Care – PPO

## 2019-11-15 ENCOUNTER — Ambulatory Visit: Payer: BC Managed Care – PPO | Attending: Internal Medicine

## 2019-11-15 DIAGNOSIS — Z23 Encounter for immunization: Secondary | ICD-10-CM | POA: Insufficient documentation

## 2019-11-15 NOTE — Progress Notes (Signed)
   Covid-19 Vaccination Clinic  Name:  Ariana Wilson    MRN: [id number] DOB: 09-25-1960  11/15/2019  [name] was observed post Covid-19 immunization for 15 minutes without incident. She was provided with Vaccine Information Sheet and instruction to access the V-Safe system.   [name] was instructed to call 911 with any severe reactions post vaccine: [name] Difficulty breathing  . Swelling of face and throat  . A fast heartbeat  . A bad rash all over body  . Dizziness and weakness   Immunizations Administered    Name Date Dose VIS Date Route   Pfizer COVID-19 Vaccine 11/15/2019  8:27 AM 0.3 mL 08/23/2019 Intramuscular   Manufacturer: [place]   Lot: [id number]   [place]: [id number]

## 2019-12-06 ENCOUNTER — Ambulatory Visit: Payer: BC Managed Care – PPO | Attending: Internal Medicine

## 2019-12-06 DIAGNOSIS — Z23 Encounter for immunization: Secondary | ICD-10-CM

## 2019-12-06 NOTE — Progress Notes (Signed)
   Covid-19 Vaccination Clinic  Name:  Ariana Wilson    MRN: [id number] DOB: [birth year]  12/06/2019  [name] was observed post Covid-19 immunization for 15 minutes without incident. She was provided with Vaccine Information Sheet and instruction to access the V-Safe system.   [name] was instructed to call 911 with any severe reactions post vaccine: [name] Difficulty breathing  . Swelling of face and throat  . A fast heartbeat  . A bad rash all over body  . Dizziness and weakness   Immunizations Administered    Name Date Dose VIS Date Route   Pfizer COVID-19 Vaccine 12/06/2019  8:10 AM 0.3 mL 08/23/2019 Intramuscular   Manufacturer: [employer]   Lot: [id number]   [place]: [id number]

## 2020-01-06 ENCOUNTER — Encounter: Payer: Self-pay | Admitting: Obstetrics and Gynecology

## 2020-01-06 ENCOUNTER — Ambulatory Visit (INDEPENDENT_AMBULATORY_CARE_PROVIDER_SITE_OTHER): Payer: BC Managed Care – PPO | Admitting: Obstetrics and Gynecology

## 2020-01-06 ENCOUNTER — Other Ambulatory Visit: Payer: Self-pay

## 2020-01-06 VITALS — BP 104/72 | Ht 60.0 in | Wt 156.0 lb

## 2020-01-06 DIAGNOSIS — N761 Subacute and chronic vaginitis: Secondary | ICD-10-CM | POA: Diagnosis not present

## 2020-01-06 DIAGNOSIS — Z1231 Encounter for screening mammogram for malignant neoplasm of breast: Secondary | ICD-10-CM

## 2020-01-06 DIAGNOSIS — Z01419 Encounter for gynecological examination (general) (routine) without abnormal findings: Secondary | ICD-10-CM | POA: Diagnosis not present

## 2020-01-06 MED ORDER — CLOTRIMAZOLE-BETAMETHASONE 1-0.05 % EX CREA: TOPICAL_CREAM | CUTANEOUS | 0 refills | Status: DC

## 2020-01-06 NOTE — Patient Instructions (Signed)
I value your feedback and entrusting us with your care. If you get a [facility] patient survey, I would appreciate you taking the time to let us know about your experience today. Thank you! ° °As of August 22, 2019, your lab results will be released to your MyChart immediately, before I even have a chance to see them. Please give me time to review them and contact you if there are any abnormalities. Thank you for your patience.  ° °[facility]: [phone] ° ° ° °

## 2020-01-06 NOTE — Progress Notes (Signed)
PCP: Rusty Aus, MD   Chief Complaint  Patient presents with  . Gynecologic Exam  . Vaginal Itching    little odor and irritation, no discharge    HPI:      [name] is a 59 y.o. G2P2 who LMP was No LMP recorded. Patient has had a hysterectomy., presents today for her annual examination.  Her menses are absent due to lap supracx hyst with BSO 2008 with Dr. Ammie Dalton.  She does not have intermenstrual bleeding. She does not have vasomotor sx.   Sex activity: not sex active - status post hysterectomy. She does have vaginal dryness/dysapareunia.  Continues to have vaginal itching/irritation/burning near clitoris. Wears unscented pads daily but changes frequently. Uses dove sens skin soap now and unscented dryer sheets. Treated with lotrisone crm last yr with sx relief, but pt hasn't used cream in a while. Had neg yeast/AV culture.   Last Pap: 11/12/18  Results were: no abnormalities /neg HPV DNA.  Hx of STDs: none  Last mammogram: 06/04/19  Results were: normal--routine follow-up in 12 months There is no FH of breast cancer. There is no FH of ovarian cancer. The patient does not do self-breast exams.  Colonoscopy: age 47 and 60 with polyps;  Repeat due after 5 years.   Tobacco use: The patient denies current or previous tobacco use. Alcohol use: none  No drug use.  Exercise: moderately active  She does get adequate calcium and Vitamin D in her diet.  Labs with PCP.   Past Medical History:  Diagnosis Date  . Anemia   . Atrial fibrillation ([place])   . Hyperlipidemia   . Menopause   . Palpitations    Hx of, 25 years ago    Past Surgical History:  Procedure Laterality Date  . BILATERAL SALPINGOOPHORECTOMY  2008  . DILATION AND CURETTAGE OF UTERUS  08/24/06  . LAPAROSCOPIC SUPRACERVICAL HYSTERECTOMY  2008   Dr. Ammie Dalton; with BSO  . NOSE SURGERY  7/09    Family History  Problem Relation Age of Onset  . Heart disease Father   . Hyperlipidemia Father   .  Hyperlipidemia Mother   . Hypertension Mother   . Heart attack Brother   . Arrhythmia Sister        Atrial fibrillation  . Heart disease Sister   . Breast cancer Neg Hx     Social History   Socioeconomic History  . Marital status: Married    Spouse name: Not on file  . Number of children: Not on file  . Years of education: Not on file  . Highest education level: Not on file  Occupational History  . Occupation: Full time  Tobacco Use  . Smoking status: Never Smoker  . Smokeless tobacco: Never Used  Substance and Sexual Activity  . Alcohol use: No  . Drug use: No  . Sexual activity: Yes    Birth control/protection: Surgical    Comment: Hysterectomy  Other Topics Concern  . Not on file  Social History Narrative   Married with 2 children   Gets regular exercise   Social Determinants of Health   Financial Resource Strain:   . Difficulty of Paying Living Expenses:   Food Insecurity:   . Worried About Charity fundraiser in the Last Year:   . Arboriculturist in the Last Year:   Transportation Needs:   . Film/video editor (Medical):   [name] Lack of Transportation (Non-Medical):  Physical Activity:   . Days of Exercise per Week:   . Minutes of Exercise per Session:   Stress:   . Feeling of Stress :   Social Connections:   . Frequency of Communication with Friends and Family:   . Frequency of Social Gatherings with Friends and Family:   . Attends Religious Services:   . Active Member of Clubs or Organizations:   . Attends Archivist Meetings:   [name] Marital Status:   Intimate Partner Violence:   . Fear of Current or Ex-Partner:   . Emotionally Abused:   [name] Physically Abused:   . Sexually Abused:     Outpatient Medications Prior to Visit  Medication Sig Dispense Refill  . aspirin 325 MG tablet Take by mouth.    . Calcium Carbonate-Vitamin D (CALCIUM + D) 600-200 MG-UNIT TABS Take 1 tablet by mouth daily.      . Cholecalciferol (VITAMIN D PO) Take by  mouth.    . Cholecalciferol 25 MCG (1000 UT) tablet Take by mouth.    [name] DHEA 25 MG tablet DHEA 25 mg tablet  Take by oral route.    . diclofenac Sodium (VOLTAREN) 1 % GEL Apply 2 g topically 4 (four) times daily.    [name] diltiazem (CARDIZEM CD) 180 MG 24 hr capsule TAKE ONE CAPSULE BY MOUTH EVERY DAY 90 capsule 0  . L-Lysine 1000 MG TABS Take by mouth.    . levocetirizine (XYZAL) 5 MG tablet Take by mouth.    . meclizine (ANTIVERT) 25 MG tablet Take 1 tablet (25 mg total) by mouth 3 (three) times daily as needed for dizziness. 30 tablet 0  . mometasone (NASONEX) 50 MCG/ACT nasal spray Place 2 sprays into the nose daily.    . Omega-3 Fatty Acids (FISH OIL) 1200 MG CAPS Take by mouth daily.     [name] omeprazole (PRILOSEC) 20 MG capsule     . simvastatin (ZOCOR) 20 MG tablet TAKE 1 TABLET BY MOUTH EVERY EVENING. 30 tablet 0  . traZODone (DESYREL) 50 MG tablet Take 50 mg by mouth as needed.     . vitamin B-12 (CYANOCOBALAMIN) 1000 MCG tablet Take 1,000 mcg by mouth daily.    . Zinc Citrate-Phytase (ZYTAZE) 25-500 MG CAPS Take by mouth.    . clotrimazole-betamethasone (LOTRISONE) cream Apply externally BID prn sx up to 2 wks 15 g 0  . metoprolol tartrate (LOPRESSOR) 25 MG tablet TAKE 1 TABLET (25 MG TOTAL) BY MOUTH 2 (TWO) TIMES DAILY AS NEEDED. 60 tablet 5  . aspirin 81 MG tablet Take 81 mg by mouth daily.    . diclofenac sodium (VOLTAREN) 1 % GEL APPLY 2 GRAMS TO AFFECTED AREA(S) TOPICALLY 4 TIMES A DAY    . diltiazem (CARDIZEM) 30 MG tablet TAKE 1 TABLET BY MOUTH AS NEEDED FOR AFIB/PALPITATIONS 90 tablet 0  . Lysine HCl (L-FORMULA LYSINE HCL) 500 MG TABS Take 500 mg by mouth daily.    . Nutritional Supplements (DHEA) 15-50 MG CAPS Take 1 capsule by mouth daily.      No facility-administered medications prior to visit.       ROS:  Review of Systems  Constitutional: Negative for fatigue, fever and unexpected weight change.  Respiratory: Negative for cough, shortness of breath and wheezing.    Cardiovascular: Negative for chest pain, palpitations and leg swelling.  Gastrointestinal: Negative for blood in stool, constipation, diarrhea, nausea and vomiting.  Endocrine: Negative for cold intolerance, heat intolerance and polyuria.  Genitourinary: Positive for dyspareunia.  Negative for dysuria, flank pain, frequency, genital sores, hematuria, menstrual problem, pelvic pain, urgency, vaginal bleeding, vaginal discharge and vaginal pain.  Musculoskeletal: Negative for back pain, joint swelling and myalgias.  Skin: Negative for rash.  Neurological: Negative for dizziness, syncope, light-headedness, numbness and headaches.  Hematological: Negative for adenopathy.  Psychiatric/Behavioral: Negative for agitation, confusion, sleep disturbance and suicidal ideas. The patient is not nervous/anxious.    BREAST: No symptoms    Objective: BP 104/72   Ht 5' (1.524 m)   Wt 156 lb (70.8 kg)   BMI 30.47 kg/m    Physical Exam Constitutional:      Appearance: She is well-developed.  Genitourinary:     Vulva, vagina, cervix, right adnexa and left adnexa normal.     No vulval lesion or tenderness noted.        No vaginal discharge, erythema or tenderness.     No cervical polyp.     Uterus is not tender.     Uterus is absent.     No right or left adnexal mass present.     Right adnexa not tender.     Left adnexa not tender.     Genitourinary Comments: UTERUS SURG ABSENT; CLITORAL AREA WITH ERYTHEMA, HYPERTROPHY; NO EVIDENCE OF LS  Neck:     Thyroid: No thyromegaly.  Cardiovascular:     Rate and Rhythm: Normal rate and regular rhythm.     Heart sounds: Normal heart sounds. No murmur.  Pulmonary:     Effort: Pulmonary effort is normal.     Breath sounds: Normal breath sounds.  Chest:     Breasts:        Right: No mass, nipple discharge, skin change or tenderness.        Left: No mass, nipple discharge, skin change or tenderness.  Abdominal:     Palpations: Abdomen is soft.      Tenderness: There is no abdominal tenderness. There is no guarding.  Musculoskeletal:        General: Normal range of motion.     Cervical back: Normal range of motion.  Neurological:     Mental Status: She is alert and oriented to person, place, and time.     Cranial Nerves: No cranial nerve deficit.  Psychiatric:        Behavior: Behavior normal.  Vitals reviewed.     Assessment/Plan:  Encounter for annual routine gynecological examination  Encounter for screening mammogram for malignant neoplasm of breast - Plan: MM 3D SCREEN BREAST BILATERAL; pt to sched mammo  Chronic vaginitis - Plan: clotrimazole-betamethasone (LOTRISONE) cream; clitoral area. Most likely fungal due to moisture with pads. Rx RF lotrisone crm for 2 wks. F/u if sx persist after tx. Otherwise, then start A&D oint/desitin as moisture block. Keep dry, change pads, trim hair. F/u prn.    Meds ordered this encounter  Medications  . clotrimazole-betamethasone (LOTRISONE) cream    Sig: Apply externally BID for 2 wks    Dispense:  15 g    Refill:  0    Order Specific Question:   Supervising Provider    Answer:   Gae Dry [id number]            GYN counsel breast self exam, mammography screening, menopause, adequate intake of calcium and vitamin D, diet and exercise    F/U  Return in about 1 year (around 01/05/2021).  Alicia B. Copland, PA-C 01/06/2020 4:38 PM

## 2020-03-07 ENCOUNTER — Other Ambulatory Visit: Payer: Self-pay | Admitting: Obstetrics and Gynecology

## 2020-03-07 DIAGNOSIS — N761 Subacute and chronic vaginitis: Secondary | ICD-10-CM

## 2020-03-09 ENCOUNTER — Other Ambulatory Visit: Payer: Self-pay | Admitting: Obstetrics and Gynecology

## 2020-03-09 DIAGNOSIS — N761 Subacute and chronic vaginitis: Secondary | ICD-10-CM

## 2020-03-09 MED ORDER — CLOTRIMAZOLE-BETAMETHASONE 1-0.05 % EX CREA: TOPICAL_CREAM | CUTANEOUS | 0 refills | Status: DC

## 2020-06-04 ENCOUNTER — Ambulatory Visit
Admission: RE | Admit: 2020-06-04 | Discharge: 2020-06-04 | Disposition: A | Discharge status: Home / Self Care | Payer: BC Managed Care – PPO | Source: Ambulatory Visit | Attending: Obstetrics and Gynecology | Admitting: Obstetrics and Gynecology

## 2020-06-04 ENCOUNTER — Other Ambulatory Visit: Payer: Self-pay

## 2020-06-04 DIAGNOSIS — Z1231 Encounter for screening mammogram for malignant neoplasm of breast: Secondary | ICD-10-CM | POA: Insufficient documentation

## 2020-06-10 ENCOUNTER — Ambulatory Visit: Payer: BC Managed Care – PPO | Admitting: Podiatry

## 2020-06-15 DIAGNOSIS — Z8616 Personal history of COVID-19: Secondary | ICD-10-CM | POA: Insufficient documentation

## 2020-07-15 ENCOUNTER — Ambulatory Visit: Payer: BC Managed Care – PPO | Admitting: Podiatry

## 2020-07-15 ENCOUNTER — Other Ambulatory Visit: Payer: Self-pay

## 2020-07-15 ENCOUNTER — Encounter: Payer: Self-pay | Admitting: Podiatry

## 2020-07-15 DIAGNOSIS — L600 Ingrowing nail: Secondary | ICD-10-CM

## 2020-07-15 DIAGNOSIS — M7070 Other bursitis of hip, unspecified hip: Secondary | ICD-10-CM | POA: Insufficient documentation

## 2020-07-15 MED ORDER — NEOMYCIN-POLYMYXIN-HC 3.5-10000-1 OT SOLN: OTIC | 0 refills | Status: DC

## 2020-07-15 NOTE — Progress Notes (Signed)
Subjective:  Patient ID: Ariana Wilson, female    DOB: [birth year],  MRN: [id number] HPI Chief Complaint  Patient presents with  . Nail Problem    Patient presents today for removal of ingrown hallux toenails bilat borders    59 y.o. female presents with the above complaint.   ROS: Denies fever chills nausea vomiting muscle aches pains calf pain back pain chest pain shortness of breath.  Past Medical History:  Diagnosis Date  . Anemia   . Atrial fibrillation ([place])   . Hyperlipidemia   . Menopause   . Palpitations    Hx of, 25 years ago   Past Surgical History:  Procedure Laterality Date  . BILATERAL SALPINGOOPHORECTOMY  2008  . DILATION AND CURETTAGE OF UTERUS  08/24/06  . LAPAROSCOPIC SUPRACERVICAL HYSTERECTOMY  2008   Dr. Ammie Wilson; with [facility]  . NOSE SURGERY  7/09    Current Outpatient Medications:  .  albuterol (PROVENTIL) (2.5 MG/3ML) 0.083% nebulizer solution, Inhale into the lungs., Disp: , Rfl:  .  budesonide (PULMICORT) 1 MG/2ML nebulizer solution, Inhale into the lungs., Disp: , Rfl:  .  diltiazem (TIAZAC) 180 MG 24 hr capsule, Take by mouth., Disp: , Rfl:  .  flecainide (TAMBOCOR) 50 MG tablet, Take 1 tablet by mouth 2 (two) times daily., Disp: , Rfl:  .  acetaminophen (TYLENOL) 325 MG tablet, Take by mouth., Disp: , Rfl:  .  aspirin 325 MG tablet, Take by mouth., Disp: , Rfl:  .  Calcium Carbonate-Vitamin D (CALCIUM + D) 600-200 MG-UNIT TABS, Take 1 tablet by mouth daily.  , Disp: , Rfl:  .  Cholecalciferol 25 MCG (1000 UT) tablet, Take by mouth., Disp: , Rfl:  .  clotrimazole-betamethasone (LOTRISONE) cream, Apply externally BID for 2 wks, Disp: 15 g, Rfl: 0 .  DHEA 25 MG tablet, DHEA 25 mg tablet  Take by oral route., Disp: , Rfl:  .  diclofenac Sodium (VOLTAREN) 1 % GEL, Apply 2 g topically 4 (four) times daily., Disp: , Rfl:  .  L-Lysine 1000 MG TABS, Take by mouth., Disp: , Rfl:  .  levocetirizine (XYZAL) 5 MG tablet, Take by mouth., Disp: , Rfl:  .   meclizine (ANTIVERT) 25 MG tablet, Take 1 tablet (25 mg total) by mouth 3 (three) times daily as needed for dizziness., Disp: 30 tablet, Rfl: 0 .  metoprolol tartrate (LOPRESSOR) 25 MG tablet, TAKE 1 TABLET (25 MG TOTAL) BY MOUTH 2 (TWO) TIMES DAILY AS NEEDED., Disp: 60 tablet, Rfl: 5 .  mometasone (NASONEX) 50 MCG/ACT nasal spray, Place 2 sprays into the nose daily., Disp: , Rfl:  .  neomycin-polymyxin-hydrocortisone (CORTISPORIN) OTIC solution, Apply 1-2 drops to toe after soaking, Disp: 10 mL, Rfl: 0 .  Omega-3 Fatty Acids (FISH OIL) 1200 MG CAPS, Take by mouth daily. , Disp: , Rfl:  .  omeprazole (PRILOSEC) 20 MG capsule, , Disp: , Rfl:  .  simvastatin (ZOCOR) 20 MG tablet, TAKE 1 TABLET BY MOUTH EVERY EVENING., Disp: 30 tablet, Rfl: 0 .  traZODone (DESYREL) 50 MG tablet, Take 50 mg by mouth as needed. , Disp: , Rfl:  .  vitamin B-12 (CYANOCOBALAMIN) 1000 MCG tablet, Take 1,000 mcg by mouth daily., Disp: , Rfl:  .  Zinc Citrate-Phytase (ZYTAZE) 25-500 MG CAPS, Take by mouth., Disp: , Rfl:   Allergies  Allergen Reactions  . Sulfa Antibiotics Hives    Says her tongue swelled a bit  . Sulfonamide Derivatives Swelling    Tongue swelling  Review of Systems Objective:  There were no vitals filed for this visit.  General: Well developed, nourished, in no acute distress, alert and oriented x3   Dermatological: Skin is warm, dry and supple bilateral. Nails x 10 are well maintained; remaining integument appears unremarkable at this time. There are no open sores, no preulcerative lesions, no rash or signs of infection present.  Sharp innervated nail margin along the hallux border bilaterally.  Mild erythema no cellulitis drainage or odor mild tenderness on palpation.  Vascular: Dorsalis Pedis artery and Posterior Tibial artery pedal pulses are 2/4 bilateral with immedate capillary fill time. Pedal hair growth present. No varicosities and no lower extremity edema present bilateral.   Neruologic:  Grossly intact via light touch bilateral. Vibratory intact via tuning fork bilateral. Protective threshold with Semmes Wienstein monofilament intact to all pedal sites bilateral. Patellar and Achilles deep tendon reflexes 2+ bilateral. No Babinski or clonus noted bilateral.   Musculoskeletal: No gross boney pedal deformities bilateral. No pain, crepitus, or limitation noted with foot and ankle range of motion bilateral. Muscular strength 5/5 in all groups tested bilateral.  Gait: Unassisted, Nonantalgic.    Radiographs:  None taken  Assessment & Plan:   Assessment: Ingrown toenails hallux bilateral  Plan: Chemical matrixectomy performed today after local anesthetic was administered was tolerated well.  She was provided with both oral and written home-going instruction for care and soaking of the toe as well as a prescription for Corticosporin otic to be applied twice daily after soaking.  She will follow up with me in 2 weeks for reevaluation.     Ariana Wilson T. [place], [place]

## 2020-07-15 NOTE — Patient Instructions (Signed)

## 2020-08-03 ENCOUNTER — Ambulatory Visit (INDEPENDENT_AMBULATORY_CARE_PROVIDER_SITE_OTHER): Payer: BC Managed Care – PPO | Admitting: Podiatry

## 2020-08-03 ENCOUNTER — Other Ambulatory Visit: Payer: Self-pay

## 2020-08-03 DIAGNOSIS — L600 Ingrowing nail: Secondary | ICD-10-CM

## 2020-08-03 NOTE — Progress Notes (Signed)
She presents today for follow-up of her nail procedure hallux bilaterally.  States that she has been soaking twice a day regularly but she is concerned about how they look.  She states that they really do not hurt at all.  Objective: Vital signs are stable she is alert and oriented x3 there is some leaching of the phenol underneath the skin which is caused some bruising and some epidermal separation.  All in all is going to do very well I explained to her that this will turn black and then slough off but will be normal underneath.  She understands and is amenable to it.  Assessment: Well-healing surgical toes.  Plan: Cooperative day leave open at bedtime continue to soak every other day.  Continue Cortisporin Otic as directed.

## 2020-08-26 ENCOUNTER — Ambulatory Visit: Payer: BC Managed Care – PPO | Admitting: Podiatry

## 2020-11-10 ENCOUNTER — Other Ambulatory Visit: Payer: Self-pay | Admitting: Cardiovascular Disease
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
## 2021-05-05 ENCOUNTER — Other Ambulatory Visit: Payer: Self-pay | Admitting: Internal Medicine

## 2021-05-05 ENCOUNTER — Other Ambulatory Visit: Payer: Self-pay | Admitting: Obstetrics and Gynecology

## 2021-05-05 DIAGNOSIS — Z1231 Encounter for screening mammogram for malignant neoplasm of breast: Secondary | ICD-10-CM

## 2021-06-07 ENCOUNTER — Ambulatory Visit
Admission: RE | Admit: 2021-06-07 | Discharge: 2021-06-07 | Disposition: A | Discharge status: Home / Self Care | Payer: BC Managed Care – PPO | Source: Ambulatory Visit | Attending: Internal Medicine | Admitting: Internal Medicine

## 2021-06-07 ENCOUNTER — Other Ambulatory Visit: Payer: Self-pay

## 2021-06-07 DIAGNOSIS — Z1231 Encounter for screening mammogram for malignant neoplasm of breast: Secondary | ICD-10-CM | POA: Insufficient documentation

## 2021-06-29 ENCOUNTER — Ambulatory Visit: Payer: BC Managed Care – PPO | Admitting: Obstetrics and Gynecology

## 2021-06-29 ENCOUNTER — Encounter: Payer: Self-pay | Admitting: Obstetrics and Gynecology

## 2021-06-29 ENCOUNTER — Other Ambulatory Visit: Payer: Self-pay

## 2021-06-29 VITALS — BP 112/60 | Ht 60.0 in | Wt 167.0 lb

## 2021-06-29 DIAGNOSIS — L738 Other specified follicular disorders: Secondary | ICD-10-CM | POA: Diagnosis not present

## 2021-06-29 NOTE — Progress Notes (Signed)
Ariana Aus, MD   Chief Complaint  Patient presents with   STD testing    Bumps, painful    HPI:      [name] is a 60 y.o. [id number] whose LMP was No LMP recorded. Patient has had a hysterectomy., presents today for painful vaginal lesion since yesterday.  Hx of seb gland hyperplasia. Dr. Ammie Dalton excised in past. 1 gland is now painful and red, no d/c on its own yet. Hurts to walk and sit. They have drained before in past. Has not done warm compresses yet.  Hx of clitoral irritation in past, has since resolved. Pt no longer wearing pads and sx improved.   Past Medical History:  Diagnosis Date   Anemia    Atrial fibrillation ([place])    Hyperlipidemia    Menopause    Palpitations    Hx of, 25 years ago    Past Surgical History:  Procedure Laterality Date   BILATERAL SALPINGOOPHORECTOMY  2008   DILATION AND CURETTAGE OF UTERUS  08/24/06   LAPAROSCOPIC SUPRACERVICAL HYSTERECTOMY  2008   Dr. Ammie Dalton; with BSO   NOSE SURGERY  7/09    Family History  Problem Relation Age of Onset   Hyperlipidemia Mother    Hypertension Mother    Heart disease Father    Hyperlipidemia Father    Arrhythmia Sister        Atrial fibrillation   Heart disease Sister    Heart attack Brother    Breast cancer Neg Hx     Social History   Socioeconomic History   Marital status: Married    Spouse name: Not on file   Number of children: Not on file   Years of education: Not on file   Highest education level: Not on file  Occupational History   Occupation: Full time  Tobacco Use   Smoking status: Never   Smokeless tobacco: Never  Vaping Use   Vaping Use: Never used  Substance and Sexual Activity   Alcohol use: No   Drug use: No   Sexual activity: Yes    Birth control/protection: Surgical    Comment: Hysterectomy  Other Topics Concern   Not on file  Social History Narrative   Married with 2 children   Gets regular exercise   Social Determinants of Health    Financial Resource Strain: Not on file  Food Insecurity: Not on file  Transportation Needs: Not on file  Physical Activity: Not on file  Stress: Not on file  Social Connections: Not on file  Intimate Partner Violence: Not on file    Outpatient Medications Prior to Visit  Medication Sig Dispense Refill   aspirin 325 MG tablet Take by mouth.     Calcium Carbonate-Vitamin D 600-200 MG-UNIT TABS Take 1 tablet by mouth daily.       Cholecalciferol 25 MCG (1000 UT) tablet Take by mouth.     DHEA 25 MG tablet DHEA 25 mg tablet  Take by oral route.     diclofenac Sodium (VOLTAREN) 1 % GEL Apply 2 g topically 4 (four) times daily.     diltiazem (TIAZAC) 180 MG 24 hr capsule Take by mouth.     flecainide (TAMBOCOR) 50 MG tablet Take 1 tablet by mouth 2 (two) times daily.     L-Lysine 1000 MG TABS Take by mouth.     meclizine (ANTIVERT) 25 MG tablet Take 1 tablet (25 mg total) by mouth 3 (three) times daily  as needed for dizziness. 30 tablet 0   mometasone (NASONEX) 50 MCG/ACT nasal spray Place 2 sprays into the nose daily.     Omega-3 Fatty Acids (FISH OIL) 1200 MG CAPS Take by mouth daily.      omeprazole (PRILOSEC) 20 MG capsule      simvastatin (ZOCOR) 20 MG tablet TAKE 1 TABLET BY MOUTH EVERY EVENING. 30 tablet 0   traZODone (DESYREL) 50 MG tablet Take 50 mg by mouth as needed.      vitamin B-12 (CYANOCOBALAMIN) 1000 MCG tablet Take 1,000 mcg by mouth daily.     Zinc Citrate-Phytase (ZYTAZE) 25-500 MG CAPS Take by mouth.     metoprolol tartrate (LOPRESSOR) 25 MG tablet TAKE 1 TABLET (25 MG TOTAL) BY MOUTH 2 (TWO) TIMES DAILY AS NEEDED. 60 tablet 5   acetaminophen (TYLENOL) 325 MG tablet Take by mouth.     albuterol (PROVENTIL) (2.5 MG/3ML) 0.083% nebulizer solution Inhale into the lungs.     budesonide (PULMICORT) 1 MG/2ML nebulizer solution Inhale into the lungs.     clotrimazole-betamethasone (LOTRISONE) cream Apply externally BID for 2 wks 15 g 0   levocetirizine (XYZAL) 5 MG tablet  Take by mouth.     neomycin-polymyxin-hydrocortisone (CORTISPORIN) OTIC solution Apply 1-2 drops to toe after soaking 10 mL 0   No facility-administered medications prior to visit.      ROS:  Review of Systems  Constitutional:  Negative for fever.  Gastrointestinal:  Negative for blood in stool, constipation, diarrhea, nausea and vomiting.  Genitourinary:  Positive for vaginal pain. Negative for dyspareunia, dysuria, flank pain, frequency, hematuria, urgency, vaginal bleeding and vaginal discharge.  Musculoskeletal:  Negative for back pain.  Skin:  Negative for rash.  BREAST: No symptoms   OBJECTIVE:   Vitals:  BP 112/60   Ht 5' (1.524 m)   Wt 167 lb (75.8 kg)   BMI 32.61 kg/m   Physical Exam Constitutional:      Appearance: Normal appearance.  Pulmonary:     Effort: Pulmonary effort is normal.  Genitourinary:    Labia:        Right: Lesion present. No rash or tenderness.        Left: Lesion present. No rash or tenderness.     Musculoskeletal:        General: Normal range of motion.  Neurological:     Mental Status: She is alert and oriented to person, place, and time.  Psychiatric:        Judgment: Judgment normal.    Assessment/Plan: Sebaceous gland hyperplasia of vulva--1 lesion is inflamed. No need for oral abx currently. Warm compresses/sitz baths. Will prob drain on its own. F/u prn. If sx worsen, will do oral abx.     Return if symptoms worsen or fail to improve.  Ariana Wilson B. Ariana Ayuso, PA-C 06/29/2021 4:41 PM

## 2021-06-30 ENCOUNTER — Ambulatory Visit: Payer: BC Managed Care – PPO | Admitting: Obstetrics and Gynecology

## 2022-01-24 ENCOUNTER — Ambulatory Visit: Payer: BC Managed Care – PPO | Admitting: Advanced Practice Midwife

## 2022-02-23 ENCOUNTER — Other Ambulatory Visit: Payer: Self-pay | Admitting: Internal Medicine

## 2022-02-23 DIAGNOSIS — R1312 Dysphagia, oropharyngeal phase: Secondary | ICD-10-CM

## 2022-02-28 ENCOUNTER — Encounter: Payer: Self-pay | Admitting: *Deleted

## 2022-03-01 ENCOUNTER — Ambulatory Visit: Payer: BC Managed Care – PPO | Admitting: Anesthesiology

## 2022-03-01 ENCOUNTER — Ambulatory Visit
Admission: RE | Admit: 2022-03-01 | Discharge: 2022-03-01 | Disposition: A | Discharge status: Home / Self Care | Payer: BC Managed Care – PPO | Attending: Gastroenterology | Admitting: Gastroenterology

## 2022-03-01 ENCOUNTER — Encounter
Admission: RE | Disposition: A | Discharge status: Home / Self Care | Payer: Self-pay | Source: Home / Self Care | Attending: *Deleted

## 2022-03-01 DIAGNOSIS — R1310 Dysphagia, unspecified: Secondary | ICD-10-CM | POA: Insufficient documentation

## 2022-03-01 DIAGNOSIS — C160 Malignant neoplasm of cardia: Secondary | ICD-10-CM | POA: Insufficient documentation

## 2022-03-01 DIAGNOSIS — Z7982 Long term (current) use of aspirin: Secondary | ICD-10-CM | POA: Insufficient documentation

## 2022-03-01 DIAGNOSIS — D649 Anemia, unspecified: Secondary | ICD-10-CM | POA: Diagnosis not present

## 2022-03-01 DIAGNOSIS — I10 Essential (primary) hypertension: Secondary | ICD-10-CM | POA: Insufficient documentation

## 2022-03-01 DIAGNOSIS — I4891 Unspecified atrial fibrillation: Secondary | ICD-10-CM | POA: Insufficient documentation

## 2022-03-01 DIAGNOSIS — D759 Disease of blood and blood-forming organs, unspecified: Secondary | ICD-10-CM | POA: Insufficient documentation

## 2022-03-01 DIAGNOSIS — E669 Obesity, unspecified: Secondary | ICD-10-CM | POA: Insufficient documentation

## 2022-03-01 DIAGNOSIS — E785 Hyperlipidemia, unspecified: Secondary | ICD-10-CM | POA: Insufficient documentation

## 2022-03-01 DIAGNOSIS — Z6829 Body mass index (BMI) 29.0-29.9, adult: Secondary | ICD-10-CM | POA: Diagnosis not present

## 2022-03-01 HISTORY — DX: Hypothyroidism, unspecified: E03.9

## 2022-03-01 HISTORY — DX: Essential (primary) hypertension: I10

## 2022-03-01 HISTORY — DX: Cardiac arrhythmia, unspecified: I49.9

## 2022-03-01 HISTORY — DX: Dizziness and giddiness: R42

## 2022-03-01 HISTORY — DX: Unspecified osteoarthritis, unspecified site: M19.90

## 2022-03-01 HISTORY — PX: ESOPHAGOGASTRODUODENOSCOPY (EGD) WITH PROPOFOL: SHX5813

## 2022-03-01 HISTORY — DX: Deficiency of other specified B group vitamins: E53.8

## 2022-03-01 SURGERY — ESOPHAGOGASTRODUODENOSCOPY (EGD) WITH PROPOFOL
Anesthesia: General

## 2022-03-01 MED ORDER — LIDOCAINE HCL (CARDIAC) PF 100 MG/5ML IV SOSY
PREFILLED_SYRINGE | INTRAVENOUS | Status: DC | PRN
  Administered 2022-03-01: 100 mg via INTRAVENOUS

## 2022-03-01 MED ORDER — DEXMEDETOMIDINE HCL IN NACL 200 MCG/50ML IV SOLN
INTRAVENOUS | Status: DC | PRN
  Administered 2022-03-01: 8 ug via INTRAVENOUS

## 2022-03-01 MED ORDER — LIDOCAINE HCL (PF) 2 % IJ SOLN
INTRAMUSCULAR | Status: AC
  Filled 2022-03-01: qty 5

## 2022-03-01 MED ORDER — PROPOFOL 500 MG/50ML IV EMUL
INTRAVENOUS | Status: AC
  Filled 2022-03-01: qty 50

## 2022-03-01 MED ORDER — PROPOFOL 10 MG/ML IV BOLUS
INTRAVENOUS | Status: DC | PRN
  Administered 2022-03-01: 20 mg via INTRAVENOUS
  Administered 2022-03-01: 100 mg via INTRAVENOUS
  Administered 2022-03-01 (×2): 20 mg via INTRAVENOUS
  Administered 2022-03-01: 40 mg via INTRAVENOUS

## 2022-03-01 MED ORDER — SODIUM CHLORIDE 0.9 % IV SOLN
INTRAVENOUS | Status: DC
  Administered 2022-03-01: 20 mL/h via INTRAVENOUS

## 2022-03-01 NOTE — Transfer of Care (Addendum)
Immediate Anesthesia Transfer of Care Note  Patient: Ariana Wilson  Procedure(s) Performed: ESOPHAGOGASTRODUODENOSCOPY (EGD) WITH PROPOFOL  Patient Location: PACU and Endoscopy Unit  Anesthesia Type:General  Level of Consciousness: drowsy  Airway & Oxygen Therapy: Patient Spontanous Breathing  Post-op Assessment: Report given to RN  Post vital signs: stable  Last Vitals:  Vitals Value Taken Time  BP    Temp    Pulse    Resp    SpO2      Last Pain:  Vitals:   03/01/22 1007  TempSrc: Temporal  PainSc: 0-No pain         Complications: No notable events documented.

## 2022-03-01 NOTE — Anesthesia Postprocedure Evaluation (Signed)
Anesthesia Post Note  Patient: Ariana Wilson  Procedure(s) Performed: ESOPHAGOGASTRODUODENOSCOPY (EGD) WITH PROPOFOL  Patient location during evaluation: PACU Anesthesia Type: General Level of consciousness: awake and oriented Pain management: pain level controlled Vital Signs Assessment: post-procedure vital signs reviewed and stable Respiratory status: spontaneous breathing and respiratory function stable Cardiovascular status: stable Anesthetic complications: no   No notable events documented.   Last Vitals:  Vitals:   03/01/22 1136 03/01/22 1146  BP: 106/63 (!) 110/57  Pulse: 83   Resp: 16 15  Temp:    SpO2: 97%     Last Pain:  Vitals:   03/01/22 1146  TempSrc:   PainSc: 0-No pain                 VAN STAVEREN,Tonia Avino

## 2022-03-01 NOTE — Anesthesia Preprocedure Evaluation (Signed)
Anesthesia Evaluation  Patient identified by MRN, date of birth, ID band Patient awake    Reviewed: Allergy & Precautions, NPO status , Patient's Chart, lab work & pertinent test results  Airway Mallampati: II  TM Distance: >3 FB Neck ROM: Full    Dental  (+) Teeth Intact   Pulmonary neg pulmonary ROS,    Pulmonary exam normal breath sounds clear to auscultation       Cardiovascular Exercise Tolerance: Good hypertension, Pt. on medications negative cardio ROS Normal cardiovascular exam+ dysrhythmias Atrial Fibrillation  Rhythm:Regular Rate:Normal     Neuro/Psych negative neurological ROS  negative psych ROS   GI/Hepatic negative GI ROS, Neg liver ROS,   Endo/Other  negative endocrine ROSHypothyroidism   Renal/GU negative Renal ROS  negative genitourinary   Musculoskeletal   Abdominal (+) + obese,   Peds  Hematology negative hematology ROS (+) Blood dyscrasia, anemia ,   Anesthesia Other Findings Past Medical History: No date: Anemia No date: Arthritis No date: Atrial fibrillation ([facility]) No date: B12 deficiency No date: Dysrhythmia No date: Hyperlipidemia No date: Hypertension No date: Hypothyroidism No date: Menopause No date: Palpitations     Comment:  Hx of, 25 years ago No date: Vertigo  Past Surgical History: 2008: BILATERAL SALPINGOOPHORECTOMY 08/24/2006: DILATION AND CURETTAGE OF UTERUS No date: incision tendon sheath for trigger finger; Right     Comment:  ring finger 2008: LAPAROSCOPIC SUPRACERVICAL HYSTERECTOMY     Comment:  Dr. Ammie Dalton; with BSO 03/2008: NOSE SURGERY  BMI    Body Mass Index: 29.49 kg/m      Reproductive/Obstetrics negative OB ROS                             Anesthesia Physical Anesthesia Plan  ASA: 3  Anesthesia Plan: General   Post-op Pain Management:    Induction: Intravenous  PONV Risk Score and Plan: Propofol infusion and  TIVA  Airway Management Planned: Natural Airway  Additional Equipment:   Intra-op Plan:   Post-operative Plan:   Informed Consent: I have reviewed the patients History and Physical, chart, labs and discussed the procedure including the risks, benefits and alternatives for the proposed anesthesia with the patient or authorized representative who has indicated his/her understanding and acceptance.     Dental Advisory Given  Plan Discussed with: Anesthesiologist, CRNA and Surgeon  Anesthesia Plan Comments:         Anesthesia Quick Evaluation

## 2022-03-01 NOTE — Op Note (Signed)
[facility] Gastroenterology Patient Name: Ariana Wilson Procedure Date: 03/01/2022 10:56 AM MRN: [id number] Account #: [id number] Date of Birth: April 15, 1961 Admit Type: Outpatient Age: 61 Room: [facility] ENDO ROOM 1 Gender: Female Note Status: Finalized Instrument Name: Altamese Cabal Endoscope [id number] Procedure:             Upper GI endoscopy Indications:           Dysphagia Providers:             Andrey Farmer MD, MD Referring MD:          Rusty Aus, MD (Referring MD) Medicines:             Monitored Anesthesia Care Complications:         No immediate complications. Estimated blood loss:                         Minimal. Procedure:             Pre-Anesthesia Assessment:                        - Prior to the procedure, a History and Physical was                         performed, and patient medications and allergies were                         reviewed. The patient is competent. The risks and                         benefits of the procedure and the sedation options and                         risks were discussed with the patient. All questions                         were answered and informed consent was obtained.                         Patient identification and proposed procedure were                         verified by the physician, the nurse, the                         anesthesiologist, the anesthetist and the technician                         in the endoscopy suite. Mental Status Examination:                         alert and oriented. Airway Examination: normal                         oropharyngeal airway and neck mobility. Respiratory                         Examination: clear to auscultation. CV Examination:  normal. Prophylactic Antibiotics: The patient does not                         require prophylactic antibiotics. Prior                         Anticoagulants: The patient has taken no previous                          anticoagulant or antiplatelet agents. ASA Grade                         Assessment: II - A patient with mild systemic disease.                         After reviewing the risks and benefits, the patient                         was deemed in satisfactory condition to undergo the                         procedure. The anesthesia plan was to use monitored                         anesthesia care (MAC). Immediately prior to                         administration of medications, the patient was                         re-assessed for adequacy to receive sedatives. The                         heart rate, respiratory rate, oxygen saturations,                         blood pressure, adequacy of pulmonary ventilation, and                         response to care were monitored throughout the                         procedure. The physical status of the patient was                         re-assessed after the procedure.                        After obtaining informed consent, the endoscope was                         passed under direct vision. Throughout the procedure,                         the patient's blood pressure, pulse, and oxygen                         saturations were monitored continuously. The Endoscope  was introduced through the mouth, and advanced to the                         second part of duodenum. The upper GI endoscopy was                         accomplished without difficulty. The patient tolerated                         the procedure well. Findings:      A medium-sized, ulcerating mass with no bleeding and stigmata of recent       bleeding was found at the gastroesophageal junction. The mass was       partially obstructing and partially circumferential (involving two       thirds of the lumen circumference). The lesion did start oozing after       rubbing against the scope. Biopsies were taken with a cold forceps for       histology. Estimated blood  loss was minimal.      A medium-sized, ulcerated, non-circumferential mass with oozing bleeding       was found at the gastroesophageal junction with extension into the       cardia. Biopsies were taken with a cold forceps for histology. Estimated       blood loss was minimal.      The examined duodenum was normal. 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                         Biopsied.                        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 Biopsied.                        - Normal examined duodenum. Recommendation:        - Discharge patient to home.                        - Resume previous diet.                        - No aspirin, ibuprofen, naproxen, or other                         non-steroidal anti-inflammatory drugs for 7 days after                         biopsy.                        - Await pathology results.                        - Refer to an oncologist at appointment  to be                         scheduled.                        - Perform a CT scan (computed tomography) of chest                         with contrast, abdomen with contrast and pelvis with                         contrast at appointment to be scheduled. Procedure Code(s):     --- Professional ---                        [phone], Esophagogastroduodenoscopy, flexible,                         transoral; with biopsy, single or multiple Diagnosis Code(s):     --- Professional ---                        D49.0, Neoplasm of unspecified behavior of digestive                         system                        R13.10, Dysphagia, unspecified CPT copyright 2019 American Medical Association. All rights reserved. The codes documented in this report are preliminary and upon coder review may  be revised to meet current compliance requirements. Andrey Farmer MD, MD 03/01/2022 11:22:32 AM Number of Addenda: 0 Note Initiated On: 03/01/2022 10:56 AM Estimated Blood Loss:  Estimated blood loss was minimal.      [facility]

## 2022-03-01 NOTE — Interval H&P Note (Signed)
History and Physical Interval Note:  03/01/2022 11:01 AM  Ariana Wilson  has presented today for surgery, with the diagnosis of dysphagia.  The various methods of treatment have been discussed with the patient and family. After consideration of risks, benefits and other options for treatment, the patient has consented to  Procedure(s): ESOPHAGOGASTRODUODENOSCOPY (EGD) WITH PROPOFOL (N/A) as a surgical intervention.  The patient's history has been reviewed, patient examined, no change in status, stable for surgery.  I have reviewed the patient's chart and labs.  Questions were answered to the patient's satisfaction.     Lesly Rubenstein  Ok to proceed with EGD

## 2022-03-01 NOTE — H&P (Signed)
Outpatient short stay form Pre-procedure 03/01/2022  Lesly Rubenstein, MD  Primary Physician: Rusty Aus, MD  Reason for visit:  Dysphagia  History of present illness:    61 y/o lady with history of a. Fib on full dose aspirin and HLD here for EGD for dysphagia to solids for one month. Feels like solids get stuck at xiphoid process. No blood thinners. No family history of GI malignancies. No neck surgeries. No smoking or alcohol use.    Current Facility-Administered Medications:    0.9 %  sodium chloride infusion, , Intravenous, Continuous, Danzell Birky, Hilton Cork, MD, Last Rate: 20 mL/hr at 03/01/22 1019, 20 mL/hr at 03/01/22 1019  Medications Prior to Admission  Medication Sig Dispense Refill Last Dose   aspirin 325 MG tablet Take by mouth.   02/28/2022   Calcium Carbonate-Vitamin D 600-200 MG-UNIT TABS Take 1 tablet by mouth daily.     02/28/2022   cetirizine (ZYRTEC) 10 MG tablet Take 10 mg by mouth daily.   02/28/2022   Cholecalciferol 25 MCG (1000 UT) tablet Take by mouth.   02/28/2022   DHEA 25 MG tablet DHEA 25 mg tablet  Take by oral route.   02/28/2022   diclofenac Sodium (VOLTAREN) 1 % GEL Apply 2 g topically 4 (four) times daily.   02/28/2022   diltiazem (TIAZAC) 180 MG 24 hr capsule Take by mouth.   03/01/2022 at 0700   estradiol (ESTRACE) 0.1 MG/GM vaginal cream Place 1 Applicatorful vaginally at bedtime.   02/28/2022   flecainide (TAMBOCOR) 50 MG tablet Take 1 tablet by mouth 2 (two) times daily.   03/01/2022 at 0700   L-Lysine 1000 MG TABS Take by mouth.   02/28/2022   meclizine (ANTIVERT) 25 MG tablet Take 1 tablet (25 mg total) by mouth 3 (three) times daily as needed for dizziness. 30 tablet 0 02/28/2022   mometasone (NASONEX) 50 MCG/ACT nasal spray Place 2 sprays into the nose daily.   02/28/2022   Omega-3 Fatty Acids (FISH OIL) 1200 MG CAPS Take by mouth daily.    02/28/2022   omeprazole (PRILOSEC) 20 MG capsule    02/28/2022   pantoprazole (PROTONIX) 40 MG tablet Take 40 mg  by mouth daily.   02/28/2022   simvastatin (ZOCOR) 20 MG tablet TAKE 1 TABLET BY MOUTH EVERY EVENING. 30 tablet 0 02/28/2022   traZODone (DESYREL) 50 MG tablet Take 50 mg by mouth as needed.    02/28/2022   vitamin B-12 (CYANOCOBALAMIN) 1000 MCG tablet Take 1,000 mcg by mouth daily.   02/28/2022   Zinc Citrate-Phytase (ZYTAZE) 25-500 MG CAPS Take by mouth.   02/28/2022   metoprolol tartrate (LOPRESSOR) 25 MG tablet TAKE 1 TABLET (25 MG TOTAL) BY MOUTH 2 (TWO) TIMES DAILY AS NEEDED. 60 tablet 5      Allergies  Allergen Reactions   Sulfa Antibiotics Hives    Says her tongue swelled a bit   Sulfonamide Derivatives Swelling    Tongue swelling     Past Medical History:  Diagnosis Date   Anemia    Arthritis    Atrial fibrillation (HCC)    B12 deficiency    Dysrhythmia    Hyperlipidemia    Hypertension    Hypothyroidism    Menopause    Palpitations    Hx of, 25 years ago   Vertigo     Review of systems:  Otherwise negative.    Physical Exam  Gen: Alert, oriented. Appears stated age.  HEENT: PERRLA. Lungs: No respiratory distress  CV: RRR Abd: soft, benign, no masses Ext: No edema    Planned procedures: Proceed with EGD. The patient understands the nature of the planned procedure, indications, risks, alternatives and potential complications including but not limited to bleeding, infection, perforation, damage to internal organs and possible oversedation/side effects from anesthesia. The patient agrees and gives consent to proceed.  Please refer to procedure notes for findings, recommendations and patient disposition/instructions.     Lesly Rubenstein, MD [facility]

## 2022-03-02 ENCOUNTER — Encounter: Payer: Self-pay | Admitting: Gastroenterology

## 2022-03-04 ENCOUNTER — Inpatient Hospital Stay: Payer: BC Managed Care – PPO

## 2022-03-04 ENCOUNTER — Encounter: Payer: Self-pay | Admitting: Oncology

## 2022-03-04 ENCOUNTER — Inpatient Hospital Stay: Payer: BC Managed Care – PPO | Attending: Oncology | Admitting: Oncology

## 2022-03-04 VITALS — BP 101/71 | HR 89 | Temp 99.3°F | Resp 16 | Ht 60.0 in | Wt 152.8 lb

## 2022-03-04 DIAGNOSIS — E538 Deficiency of other specified B group vitamins: Secondary | ICD-10-CM | POA: Diagnosis not present

## 2022-03-04 DIAGNOSIS — E039 Hypothyroidism, unspecified: Secondary | ICD-10-CM | POA: Diagnosis not present

## 2022-03-04 DIAGNOSIS — Z85820 Personal history of malignant melanoma of skin: Secondary | ICD-10-CM

## 2022-03-04 DIAGNOSIS — I4891 Unspecified atrial fibrillation: Secondary | ICD-10-CM

## 2022-03-04 DIAGNOSIS — Z79899 Other long term (current) drug therapy: Secondary | ICD-10-CM

## 2022-03-04 DIAGNOSIS — C159 Malignant neoplasm of esophagus, unspecified: Secondary | ICD-10-CM | POA: Diagnosis present

## 2022-03-04 DIAGNOSIS — Z862 Personal history of diseases of the blood and blood-forming organs and certain disorders involving the immune mechanism: Secondary | ICD-10-CM | POA: Diagnosis not present

## 2022-03-04 DIAGNOSIS — I10 Essential (primary) hypertension: Secondary | ICD-10-CM

## 2022-03-04 LAB — CBC WITH DIFFERENTIAL/PLATELET
Abs Immature Granulocytes: 0.02 10*3/uL (ref 0.00–0.07)
Basophils Absolute: 0.1 10*3/uL (ref 0.0–0.1)
Basophils Relative: 1 %
Eosinophils Absolute: 0.2 10*3/uL (ref 0.0–0.5)
Eosinophils Relative: 3 %
HCT: 31.6 % — ABNORMAL LOW (ref 36.0–46.0)
Hemoglobin: 10.4 g/dL — ABNORMAL LOW (ref 12.0–15.0)
Immature Granulocytes: 0 %
Lymphocytes Relative: 28 %
Lymphs Abs: 2 10*3/uL (ref 0.7–4.0)
MCH: 29.9 pg (ref 26.0–34.0)
MCHC: 32.9 g/dL (ref 30.0–36.0)
MCV: 90.8 fL (ref 80.0–100.0)
Monocytes Absolute: 0.6 10*3/uL (ref 0.1–1.0)
Monocytes Relative: 8 %
Neutro Abs: 4.4 10*3/uL (ref 1.7–7.7)
Neutrophils Relative %: 60 %
Platelets: 288 10*3/uL (ref 150–400)
RBC: 3.48 MIL/uL — ABNORMAL LOW (ref 3.87–5.11)
RDW: 13.9 % (ref 11.5–15.5)
WBC: 7.2 10*3/uL (ref 4.0–10.5)
nRBC: 0 % (ref 0.0–0.2)

## 2022-03-04 LAB — COMPREHENSIVE METABOLIC PANEL
ALT: 13 U/L (ref 0–44)
AST: 16 U/L (ref 15–41)
Albumin: 4.1 g/dL (ref 3.5–5.0)
Alkaline Phosphatase: 66 U/L (ref 38–126)
Anion gap: 9 (ref 5–15)
BUN: 18 mg/dL (ref 8–23)
CO2: 26 mmol/L (ref 22–32)
Calcium: 9.6 mg/dL (ref 8.9–10.3)
Chloride: 101 mmol/L (ref 98–111)
Creatinine, Ser: 0.75 mg/dL (ref 0.44–1.00)
GFR, Estimated: >60 mL/min (ref >60)
Glucose, Bld: 110 mg/dL — ABNORMAL HIGH (ref 70–99)
Potassium: 4.3 mmol/L (ref 3.5–5.1)
Sodium: 136 mmol/L (ref 135–145)
Total Bilirubin: 0.7 mg/dL (ref 0.3–1.2)
Total Protein: 7.7 g/dL (ref 6.5–8.1)

## 2022-03-04 LAB — FERRITIN: Ferritin: 5 ng/mL — ABNORMAL LOW (ref 11–307)

## 2022-03-04 LAB — IRON AND TIBC
Iron: 29 ug/dL (ref 28–170)
Saturation Ratios: 7 % — ABNORMAL LOW (ref 10.4–31.8)
TIBC: 430 ug/dL (ref 250–450)
UIBC: 401 ug/dL

## 2022-03-05 LAB — CEA: CEA: 1.3 ng/mL (ref 0.0–4.7)

## 2022-03-08 ENCOUNTER — Other Ambulatory Visit: Payer: Self-pay

## 2022-03-11 ENCOUNTER — Telehealth: Payer: Self-pay | Admitting: Oncology

## 2022-03-11 ENCOUNTER — Encounter: Payer: Self-pay | Admitting: Oncology

## 2022-03-11 NOTE — Telephone Encounter (Signed)
Left VM with patient to let her know that her PET scan was denied and will be appealed, and that we would call her as soon as we get more information.

## 2022-03-13 ENCOUNTER — Encounter: Payer: Self-pay | Admitting: Oncology

## 2022-03-13 NOTE — Progress Notes (Signed)
Hematology/Oncology Consult note [facility] [phone] [phone]  Patient Care Team: Rusty Aus, MD as PCP - General (Internal Medicine) Clent Jacks, RN as Oncology Nurse Navigator   Name of the patient: Ariana Wilson  [id number]  07-29-1961    Reason for referral- GE junctio mass   Referring physician- DR. Toledo  Date of visit: 03/13/22   History of presenting illness- Patient is a 61 year old female who presented with symptoms of dysphagia and underwent upper endoscopy by Dr. Haig Prophet on 03/01/2022 EGD showed a medium-sized ulcerating mass with no bleeding or stigmata of recent bleeding at the GE junction..  Mass was partially obstructing.  There is also mention of another ulcerated noncircumferential mass with oozing bleeding found at the GE junction with extension into the cardia.  Both these masses were biopsied.  Pathology was pending at the time of my visit with the patient and was reported to be a poorly differentiated carcinoma.  Patient is currently able to swallow soft foods without much difficulty.  However more solid foods occasionally feel stuck.  She does not have any longstanding history of reflux.  No family history of gastric, colon or breast cancer.  She has lost about 10 pounds in the last 3 months.  Denies any blood loss in her stool or urine.  ECOG PS- 1  Pain scale- 0   Review of systems- Review of Systems  Constitutional:  Negative for chills, fever, malaise/fatigue and weight loss.  HENT:  Negative for congestion, ear discharge and nosebleeds.   Eyes:  Negative for blurred vision.  Respiratory:  Negative for cough, hemoptysis, sputum production, shortness of breath and wheezing.   Cardiovascular:  Negative for chest pain, palpitations, orthopnea and claudication.  Gastrointestinal:  Negative for abdominal pain, blood in stool, constipation, diarrhea, heartburn, melena, nausea and vomiting.       Dysphagia   Genitourinary:  Negative for dysuria, flank pain, frequency, hematuria and urgency.  Musculoskeletal:  Negative for back pain, joint pain and myalgias.  Skin:  Negative for rash.  Neurological:  Negative for dizziness, tingling, focal weakness, seizures, weakness and headaches.  Endo/Heme/Allergies:  Does not bruise/bleed easily.  Psychiatric/Behavioral:  Negative for depression and suicidal ideas. The patient does not have insomnia.     Allergies  Allergen Reactions   Sulfa Antibiotics Hives    Says her tongue swelled a bit   Sulfonamide Derivatives Swelling    Tongue swelling    Patient Active Problem List   Diagnosis Date Noted   Bursitis of hip 07/15/2020   History of 2019 novel coronavirus disease (COVID-19) 06/15/2020   Chronic vaginitis 01/06/2020   Acute cystitis 12/07/2018   Vertigo 12/07/2018   Developmental venous anomaly 12/06/2018   Encounter for anticoagulation discussion and counseling 01/29/2018   Essential hypertension 01/27/2018   B12 deficiency 07/04/2017   Tubular adenoma 07/04/2017   Morbid obesity ([place]) 10/04/2016   Primary osteoarthritis of hand 03/08/2016   Family history of coronary artery disease 02/29/2016   Anemia    Menopause    Hyperlipemia 03/07/2011   ATRIAL FIBRILLATION 02/14/2009   PALPITATIONS 02/14/2009     Past Medical History:  Diagnosis Date   Anemia    Arthritis    Atrial fibrillation (HCC)    B12 deficiency    Dysrhythmia    Hyperlipidemia    Hypothyroidism    Menopause    Palpitations    Hx of, 25 years ago   Vertigo      Past  Surgical History:  Procedure Laterality Date   BILATERAL SALPINGOOPHORECTOMY  2008   DILATION AND CURETTAGE OF UTERUS  08/24/2006   ESOPHAGOGASTRODUODENOSCOPY (EGD) WITH PROPOFOL N/A 03/01/2022   Procedure: ESOPHAGOGASTRODUODENOSCOPY (EGD) WITH PROPOFOL;  Surgeon: Lesly Rubenstein, MD;  Location: [facility] ENDOSCOPY;  Service: Endoscopy;  Laterality: N/A;   incision tendon sheath for trigger  finger Right    ring finger   LAPAROSCOPIC SUPRACERVICAL HYSTERECTOMY  2008   Dr. Ammie Dalton; with [facility]   NOSE SURGERY  03/2008    Social History   Socioeconomic History   Marital status: Married    Spouse name: Not on file   Number of children: Not on file   Years of education: Not on file   Highest education level: Not on file  Occupational History   Occupation: Full time  Tobacco Use   Smoking status: Never   Smokeless tobacco: Never  Vaping Use   Vaping Use: Never used  Substance and Sexual Activity   Alcohol use: Never   Drug use: Never   Sexual activity: Not Currently    Birth control/protection: Surgical    Comment: Hysterectomy  Other Topics Concern   Not on file  Social History Narrative   Married with 2 children   Gets regular exercise   Social Determinants of Health   Financial Resource Strain: Not on file  Food Insecurity: Not on file  Transportation Needs: Not on file  Physical Activity: Not on file  Stress: Not on file  Social Connections: Not on file  Intimate Partner Violence: Not on file     Family History  Problem Relation Age of Onset   Hyperlipidemia Mother    Hypertension Mother    Stroke Mother    Heart disease Father    Hyperlipidemia Father    Atrial fibrillation Father    Melanoma Father    Arrhythmia Sister        Atrial fibrillation   Heart disease Sister    Atrial fibrillation Sister    Heart attack Brother    Breast cancer Neg Hx      Current Outpatient Medications:    aspirin 325 MG tablet, Take 325 mg by mouth once., Disp: , Rfl:    Calcium Carbonate-Vitamin D 600-200 MG-UNIT TABS, Take 1 tablet by mouth daily.  , Disp: , Rfl:    cetirizine (ZYRTEC) 10 MG tablet, Take 10 mg by mouth daily., Disp: , Rfl:    Cholecalciferol 25 MCG (1000 UT) tablet, Take by mouth., Disp: , Rfl:    DHEA 25 MG tablet, Take 25 mg by mouth daily., Disp: , Rfl:    diclofenac Sodium (VOLTAREN) 1 % GEL, Apply 2 g topically 4 (four) times daily.,  Disp: , Rfl:    diltiazem (TIAZAC) 180 MG 24 hr capsule, Take 180 mg by mouth daily., Disp: , Rfl:    flecainide (TAMBOCOR) 50 MG tablet, Take 1 tablet by mouth 2 (two) times daily., Disp: , Rfl:    L-Lysine 1000 MG TABS, Take 1,000 mg by mouth daily., Disp: , Rfl:    metoprolol tartrate (LOPRESSOR) 25 MG tablet, TAKE 1 TABLET (25 MG TOTAL) BY MOUTH 2 (TWO) TIMES DAILY AS NEEDED., Disp: 60 tablet, Rfl: 5   mometasone (NASONEX) 50 MCG/ACT nasal spray, Place 2 sprays into the nose daily., Disp: , Rfl:    Omega-3 Fatty Acids (FISH OIL) 1200 MG CAPS, Take by mouth daily. , Disp: , Rfl:    pantoprazole (PROTONIX) 40 MG tablet, Take 40 mg by  mouth daily., Disp: , Rfl:    simvastatin (ZOCOR) 20 MG tablet, TAKE 1 TABLET BY MOUTH EVERY EVENING., Disp: 30 tablet, Rfl: 0   vitamin B-12 (CYANOCOBALAMIN) 1000 MCG tablet, Take 1,000 mcg by mouth daily., Disp: , Rfl:    Zinc Citrate-Phytase (ZYTAZE) 25-500 MG CAPS, Take 1 Dose by mouth daily., Disp: , Rfl:    estradiol (ESTRACE) 0.1 MG/GM vaginal cream, Place 1 Applicatorful vaginally at bedtime. (Patient not taking: Reported on 03/04/2022), Disp: , Rfl:    meclizine (ANTIVERT) 25 MG tablet, Take 1 tablet (25 mg total) by mouth 3 (three) times daily as needed for dizziness. (Patient not taking: Reported on 03/04/2022), Disp: 30 tablet, Rfl: 0   omeprazole (PRILOSEC) 20 MG capsule, , Disp: , Rfl:    traZODone (DESYREL) 50 MG tablet, Take 50 mg by mouth as needed.  (Patient not taking: Reported on 03/04/2022), Disp: , Rfl:    Physical exam:  Vitals:   03/04/22 1139 03/04/22 1205  BP: 135/77 101/71  Pulse:  89  Resp: 16 16  Temp: 99.3 F (37.4 C) 99.3 F (37.4 C)  TempSrc: Tympanic Oral  Weight: 152 lb 12.8 oz (69.3 kg)   Height: 5' (1.524 m)    Physical Exam Constitutional:      General: She is not in acute distress. Cardiovascular:     Rate and Rhythm: Normal rate and regular rhythm.     Heart sounds: Normal heart sounds.  Pulmonary:     Effort:  Pulmonary effort is normal.     Breath sounds: Normal breath sounds.  Abdominal:     General: Bowel sounds are normal.     Palpations: Abdomen is soft.  Skin:    General: Skin is warm and dry.  Neurological:     Mental Status: She is alert and oriented to person, place, and time.           Latest Ref Rng & Units 03/04/2022   12:04 PM  CMP  Glucose 70 - 99 mg/dL 110   BUN 8 - 23 mg/dL 18   Creatinine 0.44 - 1.00 mg/dL 0.75   Sodium 135 - 145 mmol/L 136   Potassium 3.5 - 5.1 mmol/L 4.3   Chloride 98 - 111 mmol/L 101   CO2 22 - 32 mmol/L 26   Calcium 8.9 - 10.3 mg/dL 9.6   Total Protein 6.5 - 8.1 g/dL 7.7   Total Bilirubin 0.3 - 1.2 mg/dL 0.7   Alkaline Phos 38 - 126 U/L 66   AST 15 - 41 U/L 16   ALT 0 - 44 U/L 13       Latest Ref Rng & Units 03/04/2022   12:14 PM  CBC  WBC 4.0 - 10.5 K/uL 7.2   Hemoglobin 12.0 - 15.0 g/dL 10.4   Hematocrit 36.0 - 46.0 % 31.6   Platelets 150 - 400 K/uL 288     No images are attached to the encounter.  No results found.  Assessment and plan- Patient is a 61 y.o. 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  Squamous cell carcinomas tend to originate more in the upper esophagus.  At this time I am awaiting results of final pathology at the time of my visit.  Discussed that we would need a PET CT scan to complete her staging work-up.  If PET CT scan does not  show any evidence of distant metastatic disease she would need an EUS to a certain the T stage and that would give [place] the final staging.  If there is evidence of carcinoma that is more than a T2 would then typically neoadjuvant chemotherapy with weekly CarboTaxol and radiation is indicated for adenocarcinomas.  The cross trial did support proceeding with neoadjuvant chemotherapy prior to surgery for better surgical outcomes  in terms of achieving pathological complete response as well as disease-free survival in those who undergo chemotherapy before surgery.  Radiation is typically given for 5 weeks.  Weekly CarboTaxol chemotherapy is generally tolerated well and that is given for both adenocarcinoma and squamous carcinoma unless her pathology is something different.   Although the EGD mentions 2 separate masses, I would think this is 1 malignancy arising in the GE junction and extending into the gastric cardia.  I will be checking a CBC with differential CMP CEA ferritin and iron studies today and see her back afterPET scan and potentially EUS.  If this is surgically resectable I will be sending her to [facility] for surgical oncology opinion as well.   Thank you for this kind referral and the opportunity to participate in the care of this  Patient   Visit Diagnosis 1. Malignant neoplasm of esophagus, unspecified location [facility])     Dr. Randa Evens, MD, MPH [facility] [phone] 03/13/2022

## 2022-03-14 ENCOUNTER — Telehealth: Payer: Self-pay

## 2022-03-14 ENCOUNTER — Other Ambulatory Visit: Payer: Self-pay

## 2022-03-14 DIAGNOSIS — C159 Malignant neoplasm of esophagus, unspecified: Secondary | ICD-10-CM

## 2022-03-14 NOTE — Telephone Encounter (Signed)
Appeal documentation faxed to [facility]. Patient notified and informed that appeal decision was pending. Will notify patient when determination received.

## 2022-03-14 NOTE — Telephone Encounter (Signed)
Patient called not understanding why her insurance canceled her PET scan. Patient states she spoke with [employer] on Friday 6/30 and they stated they have not got any forms or Authorization letters from [place].   Patient called again stating she had a phone conference with [employer] today and they stated they have not received anything from [place] about a PET scan.  She states she really need this PET SCAN and is willing to pay a deposit if she has too. I stated to her that I see in her chart that Dr.rao had a peer to peer with the insurance company and was waiting on the answer for the appeal.    [employer] called and Ariana Wilson) who I spoke with stated that Ariana Wilson called them wanting to know why she was denied. Ariana Wilson stated she was not sure what was going on but she  has nothing on their side about a denial or even a authorization of the pet SCAN. She wanted to confirm if we had the right subscriber number and once read to her she stated the number is supposed to 00 after. And maybe that was the mix up.

## 2022-03-14 NOTE — Telephone Encounter (Signed)
error 

## 2022-03-14 NOTE — Telephone Encounter (Signed)
Appeal for PET scan has been denied. Dr Janese Banks informed and CT chest, abdomen, pelvis has been ordered. Pt has been notified of determination and order for CT scan. She verbalized understanding.

## 2022-03-17 ENCOUNTER — Ambulatory Visit
Admission: RE | Admit: 2022-03-17 | Discharge: 2022-03-17 | Disposition: A | Discharge status: Home / Self Care | Payer: BC Managed Care – PPO | Source: Ambulatory Visit | Attending: Oncology | Admitting: Oncology

## 2022-03-17 DIAGNOSIS — C159 Malignant neoplasm of esophagus, unspecified: Secondary | ICD-10-CM | POA: Insufficient documentation

## 2022-03-17 MED ORDER — IOHEXOL 300 MG/ML  SOLN
100.0000 mL | Freq: Once | INTRAMUSCULAR | Status: AC | PRN
  Administered 2022-03-17: 100 mL via INTRAVENOUS

## 2022-03-18 ENCOUNTER — Inpatient Hospital Stay: Payer: BC Managed Care – PPO | Attending: Oncology | Admitting: Oncology

## 2022-03-18 ENCOUNTER — Telehealth: Payer: Self-pay

## 2022-03-18 ENCOUNTER — Encounter: Payer: Self-pay | Admitting: Oncology

## 2022-03-18 ENCOUNTER — Ambulatory Visit: Payer: BC Managed Care – PPO | Admitting: Oncology

## 2022-03-18 ENCOUNTER — Telehealth (INDEPENDENT_AMBULATORY_CARE_PROVIDER_SITE_OTHER): Payer: Self-pay

## 2022-03-18 ENCOUNTER — Other Ambulatory Visit: Payer: Self-pay

## 2022-03-18 VITALS — BP 114/62 | HR 82 | Temp 97.6°F | Resp 16 | Ht 60.0 in | Wt 154.0 lb

## 2022-03-18 DIAGNOSIS — Z5111 Encounter for antineoplastic chemotherapy: Secondary | ICD-10-CM | POA: Insufficient documentation

## 2022-03-18 DIAGNOSIS — Z95828 Presence of other vascular implants and grafts: Secondary | ICD-10-CM

## 2022-03-18 DIAGNOSIS — Z7189 Other specified counseling: Secondary | ICD-10-CM | POA: Diagnosis not present

## 2022-03-18 DIAGNOSIS — Z5189 Encounter for other specified aftercare: Secondary | ICD-10-CM | POA: Diagnosis not present

## 2022-03-18 DIAGNOSIS — D509 Iron deficiency anemia, unspecified: Secondary | ICD-10-CM | POA: Diagnosis not present

## 2022-03-18 DIAGNOSIS — C159 Malignant neoplasm of esophagus, unspecified: Secondary | ICD-10-CM

## 2022-03-18 DIAGNOSIS — C7A8 Other malignant neuroendocrine tumors: Secondary | ICD-10-CM

## 2022-03-18 NOTE — Telephone Encounter (Signed)
Spoke with the patient and she is scheduled with Dr. Lucky Cowboy for 03/21/22 with a 1:00 pm arrival time to the MM. Pre-procedure instructions were discussed and patient stated she understood. I am waiting on [employer] to give the prior auth and patient was made aware of that, as this is urgent.

## 2022-03-18 NOTE — Telephone Encounter (Signed)
Faxed over omniseq testing to [facility] pathology at [phone] and recieved fax confirmation.

## 2022-03-18 NOTE — Telephone Encounter (Signed)
Reached out to [employer] for [employer]. Earnestine Mealing, MD office at [phone] and spoke to [place] explained that pt already has an appt with Dr. Lenice Llamas on 7/13. Dr. Janese Banks will like pt to see Dr. Lynford Citizen on the same day if possible and not able to wait 2-3 weeks. Per Deidra both providers are on the same floor and he has a NP appt at 9am open for 7/13. However, pts appt with D[facility] is at [place] as well. I was then transferred to [facility] the NP coordinator and she stated she will need to send the provider a message and ask for permission to book on that day. Per Freada Bergeron, if provider agrees she will reach out to pt and myself and make aware of decision. Referral has been entered STAT. Faxed over to [phone] and have recieved a fax confirmation.

## 2022-03-20 DIAGNOSIS — C7A8 Other malignant neuroendocrine tumors: Secondary | ICD-10-CM | POA: Insufficient documentation

## 2022-03-20 NOTE — H&P (View-Only) (Signed)
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 Haig Prophet on 03/01/2022 EGD showed a medium-sized ulcerating mass with no bleeding or stigmata of recent bleeding at the GE junction..  Mass was partially obstructing.  There is also mention of another ulcerated noncircumferential mass with oozing bleeding found at the GE junction with extension into the cardia.  Both these masses were biopsied.  Pathology was pending at the time of my visit with the patient and was reported to be a poorly differentiated carcinoma.  Patient is currently able to swallow soft foods without much difficulty.  However more solid foods occasionally feel stuck.   PET was denied by insurance.  CT chest abdomen and pelvis with contrast showed asymmetric distal esophageal wall thickening with thickening extending around the lesser curvature of the stomach through the gastric cardia and proximal fundus.  Paratracheal/paraesophageal, gastrohepatic and PET retroperitoneal adenopathy.  Left periaortic lymph node measuring 17 mm at the level of left renal hilum.  Final pathology showed there were 2 specimens collected.  Specimen a was poorly differentiated carcinoma with ulceration and necrosis.v verys weakly positive for synaptophysin 50% of the cells.  Cells negative for CK7 and CK23 for the BF1 CDX2 100 Melan-A HMB45 and CD20.  Specimen B   Showed  large cell neuroendocrine carcinoma with neoplastic cells positive for CD56 with diffuse strong staining and majority of the neoplastic cells were also positive for synaptophysin with weak staining Ki-67 pending.   Interval history-no acute issues since the last visit.  He is able to tolerate solid food with some difficulty  ECOG PS- 0 Pain scale- 0   Review of systems- Review of Systems  Constitutional:  Negative for chills, fever, malaise/fatigue and weight loss.  HENT:  Negative for congestion, ear discharge and nosebleeds.   Eyes:  Negative for blurred vision.  Respiratory:  Negative for cough, hemoptysis, sputum production, shortness of breath and wheezing.   Cardiovascular:  Negative for chest pain, palpitations, orthopnea and claudication.  Gastrointestinal:  Negative for abdominal pain, blood in stool, constipation, diarrhea, heartburn, melena, nausea and vomiting.       Dysphagia  Genitourinary:  Negative for dysuria, flank pain, frequency, hematuria and urgency.  Musculoskeletal:  Negative for back pain, joint pain and myalgias.  Skin:  Negative for rash.  Neurological:  Negative for dizziness, tingling, focal weakness, seizures, weakness and headaches.  Endo/Heme/Allergies:  Does not bruise/bleed easily.  Psychiatric/Behavioral:  Negative for depression and suicidal ideas. The patient does not have insomnia.       Allergies  Allergen Reactions   Sulfa Antibiotics Hives    Says her tongue swelled a bit   Sulfonamide Derivatives Swelling    Tongue swelling     Past Medical History:  Diagnosis Date   Anemia    Arthritis    Atrial fibrillation ([place])    B12 deficiency    Dysrhythmia    Hyperlipidemia    Hypothyroidism    Menopause  Palpitations    Hx of, 25 years ago   Vertigo      Past Surgical History:  Procedure Laterality Date   BILATERAL SALPINGOOPHORECTOMY  2008   DILATION AND CURETTAGE OF UTERUS  08/24/2006   ESOPHAGOGASTRODUODENOSCOPY (EGD) WITH  PROPOFOL N/A 03/01/2022   Procedure: ESOPHAGOGASTRODUODENOSCOPY (EGD) WITH PROPOFOL;  Surgeon: Lesly Rubenstein, MD;  Location: [facility] ENDOSCOPY;  Service: Endoscopy;  Laterality: N/A;   incision tendon sheath for trigger finger Right    ring finger   LAPAROSCOPIC SUPRACERVICAL HYSTERECTOMY  2008   Dr. Ammie Dalton; with BSO   NOSE SURGERY  03/2008    Social History   Socioeconomic History   Marital status: Married    Spouse name: Not on file   Number of children: Not on file   Years of education: Not on file   Highest education level: Not on file  Occupational History   Occupation: Full time  Tobacco Use   Smoking status: Never   Smokeless tobacco: Never  Vaping Use   Vaping Use: Never used  Substance and Sexual Activity   Alcohol use: Never   Drug use: Never   Sexual activity: Not Currently    Birth control/protection: Surgical    Comment: Hysterectomy  Other Topics Concern   Not on file  Social History Narrative   Married with 2 children   Gets regular exercise   Social Determinants of Health   Financial Resource Strain: Not on file  Food Insecurity: Not on file  Transportation Needs: Not on file  Physical Activity: Not on file  Stress: Not on file  Social Connections: Not on file  Intimate Partner Violence: Not on file    Family History  Problem Relation Age of Onset   Hyperlipidemia Mother    Hypertension Mother    Stroke Mother    Heart disease Father    Hyperlipidemia Father    Atrial fibrillation Father    Melanoma Father    Arrhythmia Sister        Atrial fibrillation   Heart disease Sister    Atrial fibrillation Sister    Heart attack Brother    Breast cancer Neg Hx      Current Outpatient Medications:    aspirin 325 MG tablet, Take 325 mg by mouth once., Disp: , Rfl:    Calcium Carbonate-Vitamin D 600-200 MG-UNIT TABS, Take 1 tablet by mouth daily.  , Disp: , Rfl:    cetirizine (ZYRTEC) 10 MG tablet, Take 10 mg by mouth daily., Disp: , Rfl:     Cholecalciferol 25 MCG (1000 UT) tablet, Take by mouth., Disp: , Rfl:    DHEA 25 MG tablet, Take 25 mg by mouth daily., Disp: , Rfl:    diclofenac Sodium (VOLTAREN) 1 % GEL, Apply 2 g topically 4 (four) times daily., Disp: , Rfl:    diltiazem (TIAZAC) 180 MG 24 hr capsule, Take 180 mg by mouth daily., Disp: , Rfl:    flecainide (TAMBOCOR) 50 MG tablet, Take 1 tablet by mouth 2 (two) times daily., Disp: , Rfl:    L-Lysine 1000 MG TABS, Take 1,000 mg by mouth daily., Disp: , Rfl:    mometasone (NASONEX) 50 MCG/ACT nasal spray, Place 2 sprays into the nose daily., Disp: , Rfl:    Omega-3 Fatty Acids (FISH OIL) 1200 MG CAPS, Take by mouth daily. , Disp: , Rfl:    omeprazole (PRILOSEC) 20 MG capsule, , Disp: , Rfl:    pantoprazole (PROTONIX) 40 MG tablet, Take 40  mg by mouth daily., Disp: , Rfl:    simvastatin (ZOCOR) 20 MG tablet, TAKE 1 TABLET BY MOUTH EVERY EVENING., Disp: 30 tablet, Rfl: 0   vitamin B-12 (CYANOCOBALAMIN) 1000 MCG tablet, Take 1,000 mcg by mouth daily., Disp: , Rfl:    Zinc Citrate-Phytase (ZYTAZE) 25-500 MG CAPS, Take 1 Dose by mouth daily., Disp: , Rfl:    estradiol (ESTRACE) 0.1 MG/GM vaginal cream, Place 1 Applicatorful vaginally at bedtime. (Patient not taking: Reported on 03/04/2022), Disp: , Rfl:    meclizine (ANTIVERT) 25 MG tablet, Take 1 tablet (25 mg total) by mouth 3 (three) times daily as needed for dizziness. (Patient not taking: Reported on 03/04/2022), Disp: 30 tablet, Rfl: 0   metoprolol tartrate (LOPRESSOR) 25 MG tablet, TAKE 1 TABLET (25 MG TOTAL) BY MOUTH 2 (TWO) TIMES DAILY AS NEEDED., Disp: 60 tablet, Rfl: 5   traZODone (DESYREL) 50 MG tablet, Take 50 mg by mouth as needed.  (Patient not taking: Reported on 03/04/2022), Disp: , Rfl:   Physical exam:  Vitals:   03/18/22 1101  BP: 114/62  Pulse: 82  Resp: 16  Temp: 97.6 F (36.4 C)  TempSrc: Tympanic  SpO2: 100%  Weight: 154 lb (69.9 kg)  Height: 5' (1.524 m)   Physical Exam Constitutional:       General: She is not in acute distress. Cardiovascular:     Rate and Rhythm: Normal rate and regular rhythm.     Heart sounds: Normal heart sounds.  Pulmonary:     Effort: Pulmonary effort is normal.     Breath sounds: Normal breath sounds.  Abdominal:     General: Bowel sounds are normal.     Palpations: Abdomen is soft.  Skin:    General: Skin is warm and dry.  Neurological:     Mental Status: She is alert and oriented to person, place, and time.         Latest Ref Rng & Units 03/04/2022   12:04 PM  CMP  Glucose 70 - 99 mg/dL 110   BUN 8 - 23 mg/dL 18   Creatinine 0.44 - 1.00 mg/dL 0.75   Sodium 135 - 145 mmol/L 136   Potassium 3.5 - 5.1 mmol/L 4.3   Chloride 98 - 111 mmol/L 101   CO2 22 - 32 mmol/L 26   Calcium 8.9 - 10.3 mg/dL 9.6   Total Protein 6.5 - 8.1 g/dL 7.7   Total Bilirubin 0.3 - 1.2 mg/dL 0.7   Alkaline Phos 38 - 126 U/L 66   AST 15 - 41 U/L 16   ALT 0 - 44 U/L 13       Latest Ref Rng & Units 03/04/2022   12:14 PM  CBC  WBC 4.0 - 10.5 K/uL 7.2   Hemoglobin 12.0 - 15.0 g/dL 10.4   Hematocrit 36.0 - 46.0 % 31.6   Platelets 150 - 400 K/uL 288     No images are attached to the encounter.  CT CHEST ABDOMEN PELVIS W CONTRAST  Result Date: 03/17/2022 CLINICAL DATA:  Esophageal cancer, staging. EXAM: CT CHEST, ABDOMEN, AND PELVIS WITH CONTRAST TECHNIQUE: Multidetector CT imaging of the chest, abdomen and pelvis was performed following the standard protocol during bolus administration of intravenous contrast. RADIATION DOSE REDUCTION: This exam was performed according to the departmental dose-optimization program which includes automated exposure control, adjustment of the mA and/or kV according to patient size and/or use of iterative reconstruction technique. CONTRAST:  186m OMNIPAQUE IOHEXOL 300 MG/ML  SOLN COMPARISON:  CT November 12, 2010 FINDINGS: CT CHEST FINDINGS Cardiovascular: Aortic atherosclerosis without thoracic aortic aneurysm. No central pulmonary embolus  on this nondedicated study. Coronary artery calcifications. No significant pericardial effusion/thickening. Normal size heart. Mediastinum/Nodes: No suspicious thyroid nodule. Prominent/enlarged paraesophageal and paratracheal mediastinal lymph nodes. For reference: -lobular left upper paratracheal lymph node near the thoracic inlet measures 11 mm in short axis on image 9/2. -left lower paraesophageal lymph node near the hiatus measures 11 mm in short axis on image 42/2. Asymmetric thickening of the distal esophagus measures proximally 5.0 cm in length on coronal image 81/4 and 3.2 x 2.4 cm in maximum axial dimension on image 44/2 with the asymmetric thickening extending along the lesser curvature of the stomach for instance on axial image 51/2. No pathologically enlarged hilar or axillary lymph nodes. Lungs/Pleura: No suspicious pulmonary nodules or masses. No focal airspace consolidation. No pleural effusion. No pneumothorax. Musculoskeletal: No chest wall mass or suspicious bone lesions identified. Thoracic spondylosis. CT ABDOMEN PELVIS FINDINGS Hepatobiliary: No suspicious hepatic lesion. Gallbladder is unremarkable. No biliary ductal dilation. Pancreas: No pancreatic ductal dilation or evidence of acute inflammation. Spleen: No splenomegaly or focal splenic lesion. Adrenals/Urinary Tract: Bilateral adrenal glands appear normal. No hydronephrosis. No suspicious renal mass. Urinary bladder is unremarkable for degree of degree of distension. Stomach/Bowel: Radiopaque enteric contrast material traverses distal loops of small bowel. Stomach is distended with ingested material, contrast and gas with some asymmetric wall thickening extending from the distal esophageal mass along the cardia/fundus along the lesser curvature of the stomach. No pathologic dilation of small or large bowel. Moderate volume of formed stool throughout the colon suggestive of constipation. Vascular/Lymphatic: Gastrohepatic and  retroperitoneal adenopathy to the level of the renal veins. For reference: -gastrohepatic lymph node measures 13 mm in short axis on image 52/2 -lobular left periaortic lymph node at the level of the left renal hilum measures 17 mm in short axis on image 66/2. No pathologically enlarged pelvic lymph nodes. Aortic atherosclerosis without aneurysmal dilation. Reproductive: Status post hysterectomy. No adnexal masses. Other: No significant abdominopelvic free fluid. No discrete peritoneal or omental nodularity. Musculoskeletal: No aggressive lytic or blastic lesion of bone. IMPRESSION: 1. Asymmetric distal esophageal wall thickening, consistent with patient's known esophageal neoplasm. With the thickening appearing to extend along the lesser curvature of the stomach through the gastric cardia and proximal fundus with at least a portion of the thickening related to underdistention of the stomach. 2. Paratracheal/paraesophageal, gastrohepatic and retroperitoneal adenopathy, is most consistent with nodal disease involvement. 3. No evidence of solid organ or osseous metastatic disease in the chest, abdomen or pelvis. 4.  Aortic Atherosclerosis (ICD10-I70.0). Electronically Signed   By: Dahlia Bailiff M.D.   On: 03/17/2022 17:02     Assessment and plan- Patient is a 61 y.o. female with newly diagnosed stage IV large cell neuroendocrine carcinoma of the GE junction cTx N1M1 here to discuss further management  Discussed with the patient the results of final pathology which shows large cell neuroendocrine carcinoma of the GE junction which is a rare type of tumor accounting for about 0.4 to 2% of esophageal carcinomas.  Typically neuroendocrine carcinomas are aggressive and have a poor prognosis.  PET scan was denied by her insurance and they wanted [place] to get a CT scan first.  In addition to the lower esophageal thickening which extends into the gastric cardia there is evidence of paratracheal, gastrohepatic as well as  retroperitoneal adenopathy.  My concern is that her 17 mm node at the  level of left renal hilum is outside the boundary of what constitutes regional lymph nodes and would potentially constitute stage IV disease.  Her PET scan is now approved after getting a CT scan done which she will be getting in early next week.  Patient will be seeing Dr. Elenor Quinones from thoracic surgery at [facility] in 1 week.  Given the extent of the disease I do not think this is upfront resectable.  Whether she would be a candidate for surgery down the line with evidence of retroperitoneal adenopathy also remains questionable.  Patient needs upfront chemotherapy at this time.  Based on my literature review for neuroendocrine carcinoma of the esophagus various regimens have been used including CarboTaxol, cis- irinotecan as well as carbo etoposide.  Explained to the patient that neuroendocrine carcinomas can arise anywhere in the body including the GI tract.  We typically see neuroendocrine carcinomas large cell variety more commonly in the lung and carbo etoposide is the most common regimen used in neuroendocrine carcinomas.  I would recommend carboplatin given at AUC 5 along with etoposide given IV on day 1 day 2 and day 3 IV every 3 weeks for 4 cycles.  Discussed risks and benefits of chemotherapy including all but not limited to nausea, vomiting, low blood counts, risk of infections and hospitalizations.  Treatment will be given with a palliative intent and I plan to repeat scans after 2 cycles.  She will proceed with a port placement at this time.  I am concerned about the use of concurrent chemoradiation in her situation given the risk of GI toxicity.  I would favor upfront chemotherapy first and if she has an excellent response to treatment we could consider consolidative radiation down the line.  Ki-67 on the tumor specimen is currently pending.  I am also sending of NGS testing at this time.  Given the rarity of this tumor I  would like her to get a second opinion with medical oncology at [facility] at this time and I have asked my team to see medical oncology see the patient along with Dr. Elenor Quinones same day next week.  We have sent the referral for the same as well.  I am holding off on starting any sort of treatment until she gets seen by medical oncology and surgical oncology at [facility] next week.  Patient does have evidence of iron deficiency and we will plan to give her IV iron whenever we start chemotherapy.  Patient and her husband verbalized understanding of the plan.   Cancer Staging  Primary malignant neuroendocrine neoplasm of esophagus [facility]) Staging form: Esophagus - Other Histologies, AJCC 8th Edition - Clinical stage from 03/20/2022: cTX, cN1, cM1, G3 - Signed by Sindy Guadeloupe, MD on 03/20/2022 Histopathologic type: Large cell neuroendocrine carcinoma Histologic grading system: 3 grade system      Visit Diagnosis 1. Primary malignant neuroendocrine neoplasm of esophagus ([place])   2. Goals of care, counseling/discussion      Dr. Randa Evens, MD, MPH [facility] [phone] 03/20/2022 7:42 PM

## 2022-03-20 NOTE — Progress Notes (Signed)
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 Haig Prophet on 03/01/2022 EGD showed a medium-sized ulcerating mass with no bleeding or stigmata of recent bleeding at the GE junction..  Mass was partially obstructing.  There is also mention of another ulcerated noncircumferential mass with oozing bleeding found at the GE junction with extension into the cardia.  Both these masses were biopsied.  Pathology was pending at the time of my visit with the patient and was reported to be a poorly differentiated carcinoma.  Patient is currently able to swallow soft foods without much difficulty.  However more solid foods occasionally feel stuck.   PET was denied by insurance.  CT chest abdomen and pelvis with contrast showed asymmetric distal esophageal wall thickening with thickening extending around the lesser curvature of the stomach through the gastric cardia and proximal fundus.  Paratracheal/paraesophageal, gastrohepatic and PET retroperitoneal adenopathy.  Left periaortic lymph node measuring 17 mm at the level of left renal hilum.  Final pathology showed there were 2 specimens collected.  Specimen a was poorly differentiated carcinoma with ulceration and necrosis.v verys weakly positive for synaptophysin 50% of the cells.  Cells negative for CK7 and CK23 for the BF1 CDX2 100 Melan-A HMB45 and CD20.  Specimen B   Showed  large cell neuroendocrine carcinoma with neoplastic cells positive for CD56 with diffuse strong staining and majority of the neoplastic cells were also positive for synaptophysin with weak staining Ki-67 pending.   Interval history-no acute issues since the last visit.  He is able to tolerate solid food with some difficulty  ECOG PS- 0 Pain scale- 0   Review of systems- Review of Systems  Constitutional:  Negative for chills, fever, malaise/fatigue and weight loss.  HENT:  Negative for congestion, ear discharge and nosebleeds.   Eyes:  Negative for blurred vision.  Respiratory:  Negative for cough, hemoptysis, sputum production, shortness of breath and wheezing.   Cardiovascular:  Negative for chest pain, palpitations, orthopnea and claudication.  Gastrointestinal:  Negative for abdominal pain, blood in stool, constipation, diarrhea, heartburn, melena, nausea and vomiting.       Dysphagia  Genitourinary:  Negative for dysuria, flank pain, frequency, hematuria and urgency.  Musculoskeletal:  Negative for back pain, joint pain and myalgias.  Skin:  Negative for rash.  Neurological:  Negative for dizziness, tingling, focal weakness, seizures, weakness and headaches.  Endo/Heme/Allergies:  Does not bruise/bleed easily.  Psychiatric/Behavioral:  Negative for depression and suicidal ideas. The patient does not have insomnia.       Allergies  Allergen Reactions   Sulfa Antibiotics Hives    Says her tongue swelled a bit   Sulfonamide Derivatives Swelling    Tongue swelling     Past Medical History:  Diagnosis Date   Anemia    Arthritis    Atrial fibrillation ([place])    B12 deficiency    Dysrhythmia    Hyperlipidemia    Hypothyroidism    Menopause  Palpitations    Hx of, 25 years ago   Vertigo      Past Surgical History:  Procedure Laterality Date   BILATERAL SALPINGOOPHORECTOMY  2008   DILATION AND CURETTAGE OF UTERUS  08/24/2006   ESOPHAGOGASTRODUODENOSCOPY (EGD) WITH  PROPOFOL N/A 03/01/2022   Procedure: ESOPHAGOGASTRODUODENOSCOPY (EGD) WITH PROPOFOL;  Surgeon: Lesly Rubenstein, MD;  Location: [facility] ENDOSCOPY;  Service: Endoscopy;  Laterality: N/A;   incision tendon sheath for trigger finger Right    ring finger   LAPAROSCOPIC SUPRACERVICAL HYSTERECTOMY  2008   Dr. Ammie Dalton; with BSO   NOSE SURGERY  03/2008    Social History   Socioeconomic History   Marital status: Married    Spouse name: Not on file   Number of children: Not on file   Years of education: Not on file   Highest education level: Not on file  Occupational History   Occupation: Full time  Tobacco Use   Smoking status: Never   Smokeless tobacco: Never  Vaping Use   Vaping Use: Never used  Substance and Sexual Activity   Alcohol use: Never   Drug use: Never   Sexual activity: Not Currently    Birth control/protection: Surgical    Comment: Hysterectomy  Other Topics Concern   Not on file  Social History Narrative   Married with 2 children   Gets regular exercise   Social Determinants of Health   Financial Resource Strain: Not on file  Food Insecurity: Not on file  Transportation Needs: Not on file  Physical Activity: Not on file  Stress: Not on file  Social Connections: Not on file  Intimate Partner Violence: Not on file    Family History  Problem Relation Age of Onset   Hyperlipidemia Mother    Hypertension Mother    Stroke Mother    Heart disease Father    Hyperlipidemia Father    Atrial fibrillation Father    Melanoma Father    Arrhythmia Sister        Atrial fibrillation   Heart disease Sister    Atrial fibrillation Sister    Heart attack Brother    Breast cancer Neg Hx      Current Outpatient Medications:    aspirin 325 MG tablet, Take 325 mg by mouth once., Disp: , Rfl:    Calcium Carbonate-Vitamin D 600-200 MG-UNIT TABS, Take 1 tablet by mouth daily.  , Disp: , Rfl:    cetirizine (ZYRTEC) 10 MG tablet, Take 10 mg by mouth daily., Disp: , Rfl:     Cholecalciferol 25 MCG (1000 UT) tablet, Take by mouth., Disp: , Rfl:    DHEA 25 MG tablet, Take 25 mg by mouth daily., Disp: , Rfl:    diclofenac Sodium (VOLTAREN) 1 % GEL, Apply 2 g topically 4 (four) times daily., Disp: , Rfl:    diltiazem (TIAZAC) 180 MG 24 hr capsule, Take 180 mg by mouth daily., Disp: , Rfl:    flecainide (TAMBOCOR) 50 MG tablet, Take 1 tablet by mouth 2 (two) times daily., Disp: , Rfl:    L-Lysine 1000 MG TABS, Take 1,000 mg by mouth daily., Disp: , Rfl:    mometasone (NASONEX) 50 MCG/ACT nasal spray, Place 2 sprays into the nose daily., Disp: , Rfl:    Omega-3 Fatty Acids (FISH OIL) 1200 MG CAPS, Take by mouth daily. , Disp: , Rfl:    omeprazole (PRILOSEC) 20 MG capsule, , Disp: , Rfl:    pantoprazole (PROTONIX) 40 MG tablet, Take 40  mg by mouth daily., Disp: , Rfl:    simvastatin (ZOCOR) 20 MG tablet, TAKE 1 TABLET BY MOUTH EVERY EVENING., Disp: 30 tablet, Rfl: 0   vitamin B-12 (CYANOCOBALAMIN) 1000 MCG tablet, Take 1,000 mcg by mouth daily., Disp: , Rfl:    Zinc Citrate-Phytase (ZYTAZE) 25-500 MG CAPS, Take 1 Dose by mouth daily., Disp: , Rfl:    estradiol (ESTRACE) 0.1 MG/GM vaginal cream, Place 1 Applicatorful vaginally at bedtime. (Patient not taking: Reported on 03/04/2022), Disp: , Rfl:    meclizine (ANTIVERT) 25 MG tablet, Take 1 tablet (25 mg total) by mouth 3 (three) times daily as needed for dizziness. (Patient not taking: Reported on 03/04/2022), Disp: 30 tablet, Rfl: 0   metoprolol tartrate (LOPRESSOR) 25 MG tablet, TAKE 1 TABLET (25 MG TOTAL) BY MOUTH 2 (TWO) TIMES DAILY AS NEEDED., Disp: 60 tablet, Rfl: 5   traZODone (DESYREL) 50 MG tablet, Take 50 mg by mouth as needed.  (Patient not taking: Reported on 03/04/2022), Disp: , Rfl:   Physical exam:  Vitals:   03/18/22 1101  BP: 114/62  Pulse: 82  Resp: 16  Temp: 97.6 F (36.4 C)  TempSrc: Tympanic  SpO2: 100%  Weight: 154 lb (69.9 kg)  Height: 5' (1.524 m)   Physical Exam Constitutional:       General: She is not in acute distress. Cardiovascular:     Rate and Rhythm: Normal rate and regular rhythm.     Heart sounds: Normal heart sounds.  Pulmonary:     Effort: Pulmonary effort is normal.     Breath sounds: Normal breath sounds.  Abdominal:     General: Bowel sounds are normal.     Palpations: Abdomen is soft.  Skin:    General: Skin is warm and dry.  Neurological:     Mental Status: She is alert and oriented to person, place, and time.         Latest Ref Rng & Units 03/04/2022   12:04 PM  CMP  Glucose 70 - 99 mg/dL 110   BUN 8 - 23 mg/dL 18   Creatinine 0.44 - 1.00 mg/dL 0.75   Sodium 135 - 145 mmol/L 136   Potassium 3.5 - 5.1 mmol/L 4.3   Chloride 98 - 111 mmol/L 101   CO2 22 - 32 mmol/L 26   Calcium 8.9 - 10.3 mg/dL 9.6   Total Protein 6.5 - 8.1 g/dL 7.7   Total Bilirubin 0.3 - 1.2 mg/dL 0.7   Alkaline Phos 38 - 126 U/L 66   AST 15 - 41 U/L 16   ALT 0 - 44 U/L 13       Latest Ref Rng & Units 03/04/2022   12:14 PM  CBC  WBC 4.0 - 10.5 K/uL 7.2   Hemoglobin 12.0 - 15.0 g/dL 10.4   Hematocrit 36.0 - 46.0 % 31.6   Platelets 150 - 400 K/uL 288     No images are attached to the encounter.  CT CHEST ABDOMEN PELVIS W CONTRAST  Result Date: 03/17/2022 CLINICAL DATA:  Esophageal cancer, staging. EXAM: CT CHEST, ABDOMEN, AND PELVIS WITH CONTRAST TECHNIQUE: Multidetector CT imaging of the chest, abdomen and pelvis was performed following the standard protocol during bolus administration of intravenous contrast. RADIATION DOSE REDUCTION: This exam was performed according to the departmental dose-optimization program which includes automated exposure control, adjustment of the mA and/or kV according to patient size and/or use of iterative reconstruction technique. CONTRAST:  173m OMNIPAQUE IOHEXOL 300 MG/ML  SOLN COMPARISON:  CT November 12, 2010 FINDINGS: CT CHEST FINDINGS Cardiovascular: Aortic atherosclerosis without thoracic aortic aneurysm. No central pulmonary embolus  on this nondedicated study. Coronary artery calcifications. No significant pericardial effusion/thickening. Normal size heart. Mediastinum/Nodes: No suspicious thyroid nodule. Prominent/enlarged paraesophageal and paratracheal mediastinal lymph nodes. For reference: -lobular left upper paratracheal lymph node near the thoracic inlet measures 11 mm in short axis on image 9/2. -left lower paraesophageal lymph node near the hiatus measures 11 mm in short axis on image 42/2. Asymmetric thickening of the distal esophagus measures proximally 5.0 cm in length on coronal image 81/4 and 3.2 x 2.4 cm in maximum axial dimension on image 44/2 with the asymmetric thickening extending along the lesser curvature of the stomach for instance on axial image 51/2. No pathologically enlarged hilar or axillary lymph nodes. Lungs/Pleura: No suspicious pulmonary nodules or masses. No focal airspace consolidation. No pleural effusion. No pneumothorax. Musculoskeletal: No chest wall mass or suspicious bone lesions identified. Thoracic spondylosis. CT ABDOMEN PELVIS FINDINGS Hepatobiliary: No suspicious hepatic lesion. Gallbladder is unremarkable. No biliary ductal dilation. Pancreas: No pancreatic ductal dilation or evidence of acute inflammation. Spleen: No splenomegaly or focal splenic lesion. Adrenals/Urinary Tract: Bilateral adrenal glands appear normal. No hydronephrosis. No suspicious renal mass. Urinary bladder is unremarkable for degree of degree of distension. Stomach/Bowel: Radiopaque enteric contrast material traverses distal loops of small bowel. Stomach is distended with ingested material, contrast and gas with some asymmetric wall thickening extending from the distal esophageal mass along the cardia/fundus along the lesser curvature of the stomach. No pathologic dilation of small or large bowel. Moderate volume of formed stool throughout the colon suggestive of constipation. Vascular/Lymphatic: Gastrohepatic and  retroperitoneal adenopathy to the level of the renal veins. For reference: -gastrohepatic lymph node measures 13 mm in short axis on image 52/2 -lobular left periaortic lymph node at the level of the left renal hilum measures 17 mm in short axis on image 66/2. No pathologically enlarged pelvic lymph nodes. Aortic atherosclerosis without aneurysmal dilation. Reproductive: Status post hysterectomy. No adnexal masses. Other: No significant abdominopelvic free fluid. No discrete peritoneal or omental nodularity. Musculoskeletal: No aggressive lytic or blastic lesion of bone. IMPRESSION: 1. Asymmetric distal esophageal wall thickening, consistent with patient's known esophageal neoplasm. With the thickening appearing to extend along the lesser curvature of the stomach through the gastric cardia and proximal fundus with at least a portion of the thickening related to underdistention of the stomach. 2. Paratracheal/paraesophageal, gastrohepatic and retroperitoneal adenopathy, is most consistent with nodal disease involvement. 3. No evidence of solid organ or osseous metastatic disease in the chest, abdomen or pelvis. 4.  Aortic Atherosclerosis (ICD10-I70.0). Electronically Signed   By: Dahlia Bailiff M.D.   On: 03/17/2022 17:02     Assessment and plan- Patient is a 60 y.o. female with newly diagnosed stage IV large cell neuroendocrine carcinoma of the GE junction cTx N1M1 here to discuss further management  Discussed with the patient the results of final pathology which shows large cell neuroendocrine carcinoma of the GE junction which is a rare type of tumor accounting for about 0.4 to 2% of esophageal carcinomas.  Typically neuroendocrine carcinomas are aggressive and have a poor prognosis.  PET scan was denied by her insurance and they wanted [place] to get a CT scan first.  In addition to the lower esophageal thickening which extends into the gastric cardia there is evidence of paratracheal, gastrohepatic as well as  retroperitoneal adenopathy.  My concern is that her 17 mm node at the  level of left renal hilum is outside the boundary of what constitutes regional lymph nodes and would potentially constitute stage IV disease.  Her PET scan is now approved after getting a CT scan done which she will be getting in early next week.  Patient will be seeing Dr. Elenor Quinones from thoracic surgery [facility] in 1 week.  Given the extent of the disease I do not think this is upfront resectable.  Whether she would be a candidate for surgery down the line with evidence of retroperitoneal adenopathy also remains questionable.  Patient needs upfront chemotherapy at this time.  Based on my literature review for neuroendocrine carcinoma of the esophagus various regimens have been used including CarboTaxol, cis- irinotecan as well as carbo etoposide.  Explained to the patient that neuroendocrine carcinomas can arise anywhere in the body including the GI tract.  We typically see neuroendocrine carcinomas large cell variety more commonly in the lung and carbo etoposide is the most common regimen used in neuroendocrine carcinomas.  I would recommend carboplatin given at AUC 5 along with etoposide given IV on day 1 day 2 and day 3 IV every 3 weeks for 4 cycles.  Discussed risks and benefits of chemotherapy including all but not limited to nausea, vomiting, low blood counts, risk of infections and hospitalizations.  Treatment will be given with a palliative intent and I plan to repeat scans after 2 cycles.  She will proceed with a port placement at this time.  I am concerned about the use of concurrent chemoradiation in her situation given the risk of GI toxicity.  I would favor upfront chemotherapy first and if she has an excellent response to treatment we could consider consolidative radiation down the line.  Ki-67 on the tumor specimen is currently pending.  I am also sending of NGS testing at this time.  Given the rarity of this tumor I  would like her to get a second opinion with medical oncology at [facility] at this time and I have asked my team to see medical oncology see the patient along with Dr. Elenor Quinones same day next week.  We have sent the referral for the same as well.  I am holding off on starting any sort of treatment until she gets seen by medical oncology and surgical oncology at [facility] next week.  Patient does have evidence of iron deficiency and we will plan to give her IV iron whenever we start chemotherapy.  Patient and her husband verbalized understanding of the plan.   Cancer Staging  Primary malignant neuroendocrine neoplasm of esophagus [facility]) Staging form: Esophagus - Other Histologies, AJCC 8th Edition - Clinical stage from 03/20/2022: cTX, cN1, cM1, G3 - Signed by Sindy Guadeloupe, MD on 03/20/2022 Histopathologic type: Large cell neuroendocrine carcinoma Histologic grading system: 3 grade system      Visit Diagnosis 1. Primary malignant neuroendocrine neoplasm of esophagus ([place])   2. Goals of care, counseling/discussion      Dr. Randa Evens, MD, MPH [facility] at [facility] [phone] 03/20/2022 7:42 PM

## 2022-03-21 ENCOUNTER — Ambulatory Visit
Admission: RE | Admit: 2022-03-21 | Discharge: 2022-03-21 | Disposition: A | Discharge status: Home / Self Care | Payer: BC Managed Care – PPO | Attending: Vascular Surgery | Admitting: Vascular Surgery

## 2022-03-21 ENCOUNTER — Encounter: Payer: Self-pay | Admitting: Vascular Surgery

## 2022-03-21 ENCOUNTER — Other Ambulatory Visit: Payer: Self-pay

## 2022-03-21 ENCOUNTER — Telehealth: Payer: Self-pay | Admitting: *Deleted

## 2022-03-21 ENCOUNTER — Encounter
Admission: RE | Disposition: A | Discharge status: Home / Self Care | Payer: Self-pay | Source: Home / Self Care | Attending: Vascular Surgery

## 2022-03-21 DIAGNOSIS — C159 Malignant neoplasm of esophagus, unspecified: Secondary | ICD-10-CM | POA: Insufficient documentation

## 2022-03-21 HISTORY — PX: PORTA CATH INSERTION: CATH118285

## 2022-03-21 LAB — SURGICAL PATHOLOGY

## 2022-03-21 SURGERY — PORTA CATH INSERTION
Anesthesia: Moderate Sedation

## 2022-03-21 MED ORDER — FENTANYL CITRATE (PF) 100 MCG/2ML IJ SOLN
INTRAMUSCULAR | Status: AC
  Filled 2022-03-21: qty 2

## 2022-03-21 MED ORDER — FENTANYL CITRATE (PF) 100 MCG/2ML IJ SOLN
INTRAMUSCULAR | Status: DC | PRN
  Administered 2022-03-21: 25 ug via INTRAVENOUS
  Administered 2022-03-21: 50 ug via INTRAVENOUS

## 2022-03-21 MED ORDER — METHYLPREDNISOLONE SODIUM SUCC 125 MG IJ SOLR: 125.0000 mg | Freq: Once | INTRAMUSCULAR | Status: DC | PRN

## 2022-03-21 MED ORDER — MIDAZOLAM HCL 2 MG/2ML IJ SOLN
INTRAMUSCULAR | Status: DC | PRN
  Administered 2022-03-21: 2 mg via INTRAVENOUS

## 2022-03-21 MED ORDER — FAMOTIDINE 20 MG PO TABS: 40.0000 mg | ORAL_TABLET | Freq: Once | ORAL | Status: DC | PRN

## 2022-03-21 MED ORDER — CEFAZOLIN SODIUM-DEXTROSE 2-4 GM/100ML-% IV SOLN: 2.0000 g | INTRAVENOUS | Status: AC

## 2022-03-21 MED ORDER — HYDROMORPHONE HCL 1 MG/ML IJ SOLN: 1.0000 mg | Freq: Once | INTRAMUSCULAR | Status: DC | PRN

## 2022-03-21 MED ORDER — SODIUM CHLORIDE 0.9 % IV SOLN: INTRAVENOUS | Status: DC

## 2022-03-21 MED ORDER — DIPHENHYDRAMINE HCL 50 MG/ML IJ SOLN: 50.0000 mg | Freq: Once | INTRAMUSCULAR | Status: DC | PRN

## 2022-03-21 MED ORDER — ONDANSETRON HCL 4 MG/2ML IJ SOLN: 4.0000 mg | Freq: Four times a day (QID) | INTRAMUSCULAR | Status: DC | PRN

## 2022-03-21 MED ORDER — CEFAZOLIN SODIUM-DEXTROSE 2-4 GM/100ML-% IV SOLN
INTRAVENOUS | Status: AC
  Administered 2022-03-21: 2 g via INTRAVENOUS
  Filled 2022-03-21: qty 100

## 2022-03-21 MED ORDER — MIDAZOLAM HCL 2 MG/2ML IJ SOLN
INTRAMUSCULAR | Status: DC
Start: 2022-03-21 — End: 2022-03-21
  Filled 2022-03-21: qty 2

## 2022-03-21 MED ORDER — SODIUM CHLORIDE 0.9 % IV SOLN
80.0000 mg | Freq: Once | INTRAVENOUS | Status: DC
  Filled 2022-03-21: qty 2

## 2022-03-21 MED ORDER — MIDAZOLAM HCL 2 MG/ML PO SYRP: 8.0000 mg | ORAL_SOLUTION | Freq: Once | ORAL | Status: DC | PRN

## 2022-03-21 SURGICAL SUPPLY — 11 items
ADH SKN CLS APL DERMABOND .7 (GAUZE/BANDAGES/DRESSINGS) ×1
COVER PROBE U/S 5X48 (MISCELLANEOUS) ×1 IMPLANT
DERMABOND ADVANCED (GAUZE/BANDAGES/DRESSINGS) ×1
DERMABOND ADVANCED .7 DNX12 (GAUZE/BANDAGES/DRESSINGS) IMPLANT
KIT PORT POWER 8FR ISP CVUE (Port) ×1 IMPLANT
PACK ANGIOGRAPHY (CUSTOM PROCEDURE TRAY) ×2 IMPLANT
PENCIL ELECTRO HAND CTR (MISCELLANEOUS) ×1 IMPLANT
SUT MNCRL AB 4-0 PS2 18 (SUTURE) ×1 IMPLANT
SUT VIC AB 3-0 SH 27 (SUTURE) ×2
SUT VIC AB 3-0 SH 27X BRD (SUTURE) IMPLANT
TUBING CONNECTING 10 (TUBING) ×2 IMPLANT

## 2022-03-21 NOTE — Telephone Encounter (Signed)
Patient called to request update on appointment being scheduled at [facility]. I see she sent mychart message as well.

## 2022-03-21 NOTE — Op Note (Signed)
      [place] [facility]       Operative Note  Date: 03/21/2022  Preoperative diagnosis:  1. Esophageal cancer  Postoperative diagnosis:  Same as above  Procedures: #1. Ultrasound guidance for vascular access to the right internal jugular vein. #2. Fluoroscopic guidance for placement of catheter. #3. Placement of CT compatible Port-A-Cath, right internal jugular vein.  Surgeon: Leotis Pain, MD.   Anesthesia: Local with moderate conscious sedation for approximately 30  minutes using 2 mg of Versed and 75 mcg of Fentanyl  Fluoroscopy time: less than 1 minute  Contrast used: 0  Estimated blood loss: 5 cc  Indication for the procedure:  The patient is a 61 y.o.female with esophageal cancer.  The patient needs a Port-A-Cath for durable venous access, chemotherapy, lab draws, and CT scans. We are asked to place this. Risks and benefits were discussed and informed consent was obtained.  Description of procedure: The patient was brought to the vascular and interventional radiology suite.  Moderate conscious sedation was administered throughout the procedure during a face to face encounter with the patient with my supervision of the RN administering medicines and monitoring the patient's vital signs, pulse oximetry, telemetry and mental status throughout from the start of the procedure until the patient was taken to the recovery room. The right neck chest and shoulder were sterilely prepped and draped, and a sterile surgical field was created. Ultrasound was used to help visualize a patent right internal jugular vein. This was then accessed under direct ultrasound guidance without difficulty with the Seldinger needle and a permanent image was recorded. A J-wire was placed. After skin nick and dilatation, the peel-away sheath was then placed over the wire. I then anesthetized an area under the clavicle approximately 1-2 fingerbreadths. A transverse incision was created and an inferior  pocket was created with electrocautery and blunt dissection. The port was then brought onto the field, placed into the pocket and secured to the chest wall with 2 Prolene sutures. The catheter was connected to the port and tunneled from the subclavicular incision to the access site. Fluoroscopic guidance was then used to cut the catheter to an appropriate length. The catheter was then placed through the peel-away sheath and the peel-away sheath was removed. The catheter tip was parked in excellent location under fluorocoscopic guidance in the cavoatrial junction. The pocket was then irrigated with antibiotic impregnated saline and the wound was closed with a running 3-0 Vicryl and a 4-0 Monocryl. The access incision was closed with a single 4-0 Monocryl. The Huber needle was used to withdraw blood and flush the port with heparinized saline. Dermabond was then placed as a dressing. The patient tolerated the procedure well and was taken to the recovery room in stable condition.   Leotis Pain 03/21/2022 2:56 PM   This note was created with Dragon Medical transcription system. Any errors in dictation are purely unintentional.

## 2022-03-21 NOTE — Interval H&P Note (Signed)
History and Physical Interval Note:  03/21/2022 2:07 PM  Ariana Wilson  has presented today for surgery, with the diagnosis of Porta Cath Placement   Malignant neoplasm esophagus.  The various methods of treatment have been discussed with the patient and family. After consideration of risks, benefits and other options for treatment, the patient has consented to  Procedure(s): PORTA CATH INSERTION (N/A) as a surgical intervention.  The patient's history has been reviewed, patient examined, no change in status, stable for surgery.  I have reviewed the patient's chart and labs.  Questions were answered to the patient's satisfaction.     Leotis Pain

## 2022-03-22 ENCOUNTER — Encounter
Admission: RE | Admit: 2022-03-22 | Discharge: 2022-03-22 | Disposition: A | Discharge status: Home / Self Care | Payer: BC Managed Care – PPO | Source: Ambulatory Visit | Attending: Oncology | Admitting: Oncology

## 2022-03-22 ENCOUNTER — Encounter: Payer: Self-pay | Admitting: Oncology

## 2022-03-22 ENCOUNTER — Other Ambulatory Visit: Payer: BC Managed Care – PPO

## 2022-03-22 DIAGNOSIS — C159 Malignant neoplasm of esophagus, unspecified: Secondary | ICD-10-CM | POA: Insufficient documentation

## 2022-03-22 LAB — GLUCOSE, CAPILLARY: Glucose-Capillary: 109 mg/dL — ABNORMAL HIGH (ref 70–99)

## 2022-03-22 MED ORDER — FLUDEOXYGLUCOSE F - 18 (FDG) INJECTION
8.5300 | Freq: Once | INTRAVENOUS | Status: AC | PRN
  Administered 2022-03-22: 8.53 via INTRAVENOUS

## 2022-03-23 NOTE — Telephone Encounter (Signed)
I called her

## 2022-03-24 ENCOUNTER — Ambulatory Visit
Admission: RE | Admit: 2022-03-24 | Payer: BC Managed Care – PPO | Source: Home / Self Care | Admitting: Internal Medicine

## 2022-03-24 ENCOUNTER — Encounter: Admission: RE | Payer: Self-pay | Source: Home / Self Care

## 2022-03-24 ENCOUNTER — Encounter: Payer: Self-pay | Admitting: Oncology

## 2022-03-24 DIAGNOSIS — C155 Malignant neoplasm of lower third of esophagus: Secondary | ICD-10-CM | POA: Insufficient documentation

## 2022-03-24 SURGERY — ULTRASOUND, UPPER GI TRACT, ENDOSCOPIC
Anesthesia: General

## 2022-03-25 ENCOUNTER — Other Ambulatory Visit: Payer: Self-pay | Admitting: Oncology

## 2022-03-25 ENCOUNTER — Other Ambulatory Visit: Payer: Self-pay

## 2022-03-25 DIAGNOSIS — C159 Malignant neoplasm of esophagus, unspecified: Secondary | ICD-10-CM

## 2022-03-25 DIAGNOSIS — C7A8 Other malignant neuroendocrine tumors: Secondary | ICD-10-CM

## 2022-03-25 MED ORDER — PROCHLORPERAZINE MALEATE 10 MG PO TABS: 10.0000 mg | ORAL_TABLET | Freq: Four times a day (QID) | ORAL | 1 refills | Status: DC | PRN

## 2022-03-25 MED ORDER — DEXAMETHASONE 4 MG PO TABS: 8.0000 mg | ORAL_TABLET | Freq: Every day | ORAL | 1 refills | Status: DC

## 2022-03-25 MED ORDER — LIDOCAINE-PRILOCAINE 2.5-2.5 % EX CREA: TOPICAL_CREAM | CUTANEOUS | 3 refills | Status: DC

## 2022-03-25 MED ORDER — ONDANSETRON HCL 8 MG PO TABS: 8.0000 mg | ORAL_TABLET | Freq: Two times a day (BID) | ORAL | 1 refills | Status: DC | PRN

## 2022-03-25 MED ORDER — LORAZEPAM 0.5 MG PO TABS: 0.5000 mg | ORAL_TABLET | Freq: Four times a day (QID) | ORAL | 0 refills | Status: DC | PRN

## 2022-03-25 NOTE — Progress Notes (Signed)
START OFF PATHWAY REGIMEN - Other   [id number]:Cisplatin 75 mg/m2 Day 1 + Etoposide 100 mg/m2 Days 1-3 q21 Days:   A cycle is every 21 days:     Etoposide      Cisplatin   **Always confirm dose/schedule in your pharmacy ordering system**  Patient Characteristics: Intent of Therapy: Non-Curative / Palliative Intent, Discussed with Patient

## 2022-03-27 ENCOUNTER — Other Ambulatory Visit: Payer: Self-pay | Admitting: *Deleted

## 2022-03-27 ENCOUNTER — Encounter: Payer: Self-pay | Admitting: Oncology

## 2022-03-29 ENCOUNTER — Other Ambulatory Visit: Payer: Self-pay

## 2022-03-29 ENCOUNTER — Inpatient Hospital Stay (HOSPITAL_BASED_OUTPATIENT_CLINIC_OR_DEPARTMENT_OTHER): Payer: BC Managed Care – PPO | Admitting: Oncology

## 2022-03-29 ENCOUNTER — Encounter: Payer: Self-pay | Admitting: Oncology

## 2022-03-29 ENCOUNTER — Inpatient Hospital Stay: Payer: BC Managed Care – PPO

## 2022-03-29 ENCOUNTER — Inpatient Hospital Stay: Payer: BC Managed Care – PPO | Admitting: Oncology

## 2022-03-29 ENCOUNTER — Other Ambulatory Visit: Payer: Self-pay | Admitting: *Deleted

## 2022-03-29 VITALS — BP 98/53 | HR 86 | Temp 98.0°F | Resp 18 | Wt 156.5 lb

## 2022-03-29 DIAGNOSIS — C7A8 Other malignant neuroendocrine tumors: Secondary | ICD-10-CM | POA: Diagnosis not present

## 2022-03-29 DIAGNOSIS — Z5111 Encounter for antineoplastic chemotherapy: Secondary | ICD-10-CM | POA: Diagnosis not present

## 2022-03-29 DIAGNOSIS — Z5112 Encounter for antineoplastic immunotherapy: Secondary | ICD-10-CM

## 2022-03-29 MED ORDER — LORAZEPAM 0.5 MG PO TABS: 0.5000 mg | ORAL_TABLET | Freq: Four times a day (QID) | ORAL | 0 refills | Status: DC | PRN

## 2022-03-29 MED FILL — Fosaprepitant Dimeglumine For IV Infusion 150 MG (Base Eq): INTRAVENOUS | Qty: 5 | Status: AC

## 2022-03-29 MED FILL — Dexamethasone Sodium Phosphate Inj 100 MG/10ML: INTRAMUSCULAR | Qty: 1 | Status: AC

## 2022-03-29 NOTE — Progress Notes (Signed)
Pt states she has to eat pureed and soft foods due to getting stuck in her throat.

## 2022-03-29 NOTE — Progress Notes (Signed)
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 Haig Prophet on 03/01/2022 EGD showed a medium-sized ulcerating mass with no bleeding or stigmata of recent bleeding at the GE junction..  Mass was partially obstructing.  There is also mention of another ulcerated noncircumferential mass with oozing bleeding found at the GE junction with extension into the cardia.  Both these masses were biopsied.  Pathology was pending at the time of my visit with the patient and was reported to be a poorly differentiated carcinoma.  Patient is currently able to swallow soft foods without much difficulty.  However more solid foods occasionally feel stuck.    PET was denied by insurance.  CT chest abdomen and pelvis with contrast showed asymmetric distal esophageal wall thickening with thickening extending around the lesser curvature of the stomach through the gastric cardia and proximal fundus.  Paratracheal/paraesophageal, gastrohepatic and PET retroperitoneal adenopathy.  Left periaortic lymph node measuring 17 mm at the level of left renal hilum.   Final pathology showed there were 2 specimens collected.  Specimen a was poorly differentiated carcinoma with ulceration and necrosis.v verys weakly positive for synaptophysin 50% of the cells.  Cells negative for CK7 and CK23 for the BF1 CDX2 100 Melan-A HMB45 and CD20.   Specimen B    Showed large cell neuroendocrine carcinoma with neoplastic cells positive for CD56 with diffuse strong staining and majority of the neoplastic cells were also positive for synaptophysin with weak staining Ki-67 95%  Interval history-patient reports some ongoing mild fatigue and some difficulty swallowing.  Denies other complaints at this time  ECOG PS- 1 Pain scale- 0 Opioid associated constipation- no  Review of systems- Review of Systems  Constitutional:  Positive for malaise/fatigue. Negative for chills, fever and weight loss.  HENT:  Negative for congestion, ear discharge and nosebleeds.   Eyes:  Negative for blurred vision.  Respiratory:  Negative for cough, hemoptysis, sputum production, shortness of breath and wheezing.   Cardiovascular:  Negative for chest pain, palpitations, orthopnea and claudication.  Gastrointestinal:  Negative for abdominal pain, blood in stool, constipation, diarrhea, heartburn, melena, nausea and vomiting.       Dysphagia   Genitourinary:  Negative for dysuria, flank pain, frequency, hematuria and urgency.  Musculoskeletal:  Negative for back pain, joint pain and myalgias.  Skin:  Negative for rash.  Neurological:  Negative for dizziness, tingling, focal weakness, seizures, weakness and headaches.  Endo/Heme/Allergies:  Does not bruise/bleed easily.  Psychiatric/Behavioral:  Negative for depression and suicidal ideas. The patient does not have insomnia.       Allergies  Allergen Reactions   Sulfa Antibiotics Hives    Says her tongue swelled a bit   Sulfonamide Derivatives Swelling    Tongue swelling     Past Medical History:  Diagnosis Date   Anemia    Arthritis    Atrial fibrillation ([place])    B12 deficiency    Dysrhythmia    Hyperlipidemia  Hypothyroidism    Menopause    Palpitations    Hx of, 25 years ago   Vertigo      Past Surgical History:  Procedure Laterality Date   BILATERAL SALPINGOOPHORECTOMY  2008    DILATION AND CURETTAGE OF UTERUS  08/24/2006   ESOPHAGOGASTRODUODENOSCOPY (EGD) WITH PROPOFOL N/A 03/01/2022   Procedure: ESOPHAGOGASTRODUODENOSCOPY (EGD) WITH PROPOFOL;  Surgeon: Lesly Rubenstein, MD;  Location: [facility] ENDOSCOPY;  Service: Endoscopy;  Laterality: N/A;   incision tendon sheath for trigger finger Right    ring finger   LAPAROSCOPIC SUPRACERVICAL HYSTERECTOMY  2008   Dr. Ammie Dalton; with BSO   NOSE SURGERY  03/2008   PORTA CATH INSERTION N/A 03/21/2022   Procedure: PORTA CATH INSERTION;  Surgeon: Algernon Huxley, MD;  Location: [place] [facility];  Service: Cardiovascular;  Laterality: N/A;    Social History   Socioeconomic History   Marital status: Married    Spouse name: Not on file   Number of children: Not on file   Years of education: Not on file   Highest education level: Not on file  Occupational History   Occupation: Full time  Tobacco Use   Smoking status: Never   Smokeless tobacco: Never  Vaping Use   Vaping Use: Never used  Substance and Sexual Activity   Alcohol use: Never   Drug use: Never   Sexual activity: Not Currently    Birth control/protection: Surgical    Comment: Hysterectomy  Other Topics Concern   Not on file  Social History Narrative   Married with 2 children   Gets regular exercise   Social Determinants of Health   Financial Resource Strain: Not on file  Food Insecurity: Not on file  Transportation Needs: Not on file  Physical Activity: Not on file  Stress: Not on file  Social Connections: Not on file  Intimate Partner Violence: Not on file    Family History  Problem Relation Age of Onset   Hyperlipidemia Mother    Hypertension Mother    Stroke Mother    Heart disease Father    Hyperlipidemia Father    Atrial fibrillation Father    Melanoma Father    Arrhythmia Sister        Atrial fibrillation   Heart disease Sister    Atrial fibrillation Sister    Heart attack Brother    Breast cancer Neg Hx      Current  Outpatient Medications:    aspirin 325 MG tablet, Take 325 mg by mouth once., Disp: , Rfl:    Calcium Carbonate-Vitamin D 600-200 MG-UNIT TABS, Take 1 tablet by mouth daily.  , Disp: , Rfl:    cetirizine (ZYRTEC) 10 MG tablet, Take 10 mg by mouth daily., Disp: , Rfl:    Cholecalciferol 25 MCG (1000 UT) tablet, Take by mouth., Disp: , Rfl:    DHEA 25 MG tablet, Take 25 mg by mouth daily., Disp: , Rfl:    diltiazem (TIAZAC) 180 MG 24 hr capsule, Take 180 mg by mouth daily., Disp: , Rfl:    flecainide (TAMBOCOR) 50 MG tablet, Take 1 tablet by mouth 2 (two) times daily., Disp: , Rfl:    metoprolol tartrate (LOPRESSOR) 25 MG tablet, TAKE 1 TABLET (25 MG TOTAL) BY MOUTH 2 (TWO) TIMES DAILY AS NEEDED., Disp: 60 tablet, Rfl: 5   mometasone (NASONEX) 50 MCG/ACT nasal spray, Place 2 sprays into the nose daily., Disp: , Rfl:    pantoprazole (PROTONIX) 40 MG tablet, Take 40 mg  by mouth daily., Disp: , Rfl:    Saccharomyces boulardii (PROBIOTIC) 250 MG CAPS, Take by mouth., Disp: , Rfl:    simvastatin (ZOCOR) 20 MG tablet, TAKE 1 TABLET BY MOUTH EVERY EVENING., Disp: 30 tablet, Rfl: 0   vitamin B-12 (CYANOCOBALAMIN) 1000 MCG tablet, Take 1,000 mcg by mouth daily., Disp: , Rfl:    Zinc Citrate-Phytase (ZYTAZE) 25-500 MG CAPS, Take 1 Dose by mouth daily., Disp: , Rfl:    dexamethasone (DECADRON) 4 MG tablet, Take 2 tablets (8 mg total) by mouth daily. Take for 1 day starting the day after chemotherapy on day 4. (Patient not taking: Reported on 03/29/2022), Disp: 30 tablet, Rfl: 1   diclofenac Sodium (VOLTAREN) 1 % GEL, Apply 2 g topically 4 (four) times daily., Disp: , Rfl:    estradiol (ESTRACE) 0.1 MG/GM vaginal cream, Place 1 Applicatorful vaginally at bedtime. (Patient not taking: Reported on 03/04/2022), Disp: , Rfl:    L-Lysine 1000 MG TABS, Take 1,000 mg by mouth daily. (Patient not taking: Reported on 03/29/2022), Disp: , Rfl:    lidocaine-prilocaine (EMLA) cream, Apply to affected area once, Disp: 30 g,  Rfl: 3   LORazepam (ATIVAN) 0.5 MG tablet, Take 1 tablet (0.5 mg total) by mouth every 6 (six) hours as needed (Nausea or vomiting)., Disp: 30 tablet, Rfl: 0   meclizine (ANTIVERT) 25 MG tablet, Take 1 tablet (25 mg total) by mouth 3 (three) times daily as needed for dizziness. (Patient not taking: Reported on 03/04/2022), Disp: 30 tablet, Rfl: 0   Omega-3 Fatty Acids (FISH OIL) 1200 MG CAPS, Take by mouth daily.  (Patient not taking: Reported on 03/21/2022), Disp: , Rfl:    omeprazole (PRILOSEC) 20 MG capsule, , Disp: , Rfl:    ondansetron (ZOFRAN) 8 MG tablet, Take 1 tablet (8 mg total) by mouth 2 (two) times daily as needed. Start on the third day after cisplatin chemotherapy. (Patient not taking: Reported on 03/29/2022), Disp: 30 tablet, Rfl: 1   prochlorperazine (COMPAZINE) 10 MG tablet, Take 1 tablet (10 mg total) by mouth every 6 (six) hours as needed (Nausea or vomiting). (Patient not taking: Reported on 03/29/2022), Disp: 30 tablet, Rfl: 1   traZODone (DESYREL) 50 MG tablet, Take 50 mg by mouth as needed.  (Patient not taking: Reported on 03/29/2022), Disp: , Rfl:   Physical exam:  Vitals:   03/29/22 0836  BP: (!) 98/53  Pulse: 86  Resp: 18  Temp: 98 F (36.7 C)  SpO2: 100%  Weight: 156 lb 8 oz (71 kg)   Physical Exam Constitutional:      General: She is not in acute distress. Cardiovascular:     Rate and Rhythm: Normal rate and regular rhythm.     Heart sounds: Normal heart sounds.  Pulmonary:     Effort: Pulmonary effort is normal.     Breath sounds: Normal breath sounds.  Skin:    General: Skin is warm and dry.  Neurological:     Mental Status: She is alert and oriented to person, place, and time.         Latest Ref Rng & Units 03/04/2022   12:04 PM  CMP  Glucose 70 - 99 mg/dL 110   BUN 8 - 23 mg/dL 18   Creatinine 0.44 - 1.00 mg/dL 0.75   Sodium 135 - 145 mmol/L 136   Potassium 3.5 - 5.1 mmol/L 4.3   Chloride 98 - 111 mmol/L 101   CO2 22 - 32 mmol/L 26   Calcium  8.9 - 10.3 mg/dL 9.6   Total Protein 6.5 - 8.1 g/dL 7.7   Total Bilirubin 0.3 - 1.2 mg/dL 0.7   Alkaline Phos 38 - 126 U/L 66   AST 15 - 41 U/L 16   ALT 0 - 44 U/L 13       Latest Ref Rng & Units 03/04/2022   12:14 PM  CBC  WBC 4.0 - 10.5 K/uL 7.2   Hemoglobin 12.0 - 15.0 g/dL 10.4   Hematocrit 36.0 - 46.0 % 31.6   Platelets 150 - 400 K/uL 288     No images are attached to the encounter.  NM PET Image Initial (PI) Skull Base To Thigh  Result Date: 03/22/2022 CLINICAL DATA:  Initial treatment strategy for esophageal carcinoma. EXAM: NUCLEAR MEDICINE PET SKULL BASE TO THIGH TECHNIQUE: 8.6 mCi F-18 FDG was injected intravenously. Full-ring PET imaging was performed from the skull base to thigh after the radiotracer. CT data was obtained and used for attenuation correction and anatomic localization. Fasting blood glucose: 109 mg/dl COMPARISON:  CT on 03/17/2022 FINDINGS: Mediastinal blood-pool activity (background): SUV max = 2.2 Liver activity (reference): SUV max = N/A NECK: 1.1 cm left level 4 lower jugular lymph node on image 35/3 is hypermetabolic, with SUV max of 5.3. 1.1 cm right supraclavicular lymph node on image 29/9 is hypermetabolic, with SUV max of 5.1. Incidental CT findings:  None. CHEST: Concentric hypermetabolic wall thickening is seen involving the distal thoracic esophagus and proximal stomach,, with SUV max of 11.0, consistent with known esophageal carcinoma. A 1 cm left paraesophageal lymph node is seen on image 111/2, which is hypermetabolic with SUV max of 3.9. No other hypermetabolic lymphadenopathy seen within the thorax. No suspicious pulmonary nodules seen on CT images. Incidental CT findings:  None. ABDOMEN/PELVIS: No abnormal hypermetabolic activity within the liver, pancreas, adrenal glands, or spleen. Mild hypermetabolic lymphadenopathy seen in the gastrohepatic ligament, which shows SUV max of 6.8. 11 mm lymph node in porta hepatis is also hypermetabolic, with SUV max  of 5.9. 1.8 cm left paraaortic lymph node is hypermetabolic, with SUV max of 7.9. No hypermetabolic lymph nodes seen within the pelvis. Incidental CT findings:  None. SKELETON: No focal hypermetabolic bone lesions to suggest skeletal metastasis. Incidental CT findings:  None. IMPRESSION: Hypermetabolic wall thickening involving the distal thoracic esophagus and proximal stomach, consistent with known esophageal carcinoma. Hypermetabolic lymphadenopathy in the neck, chest, and abdomen as described above, consistent with metastatic disease. Electronically Signed   By: Marlaine Hind M.D.   On: 03/22/2022 13:50   PERIPHERAL VASCULAR CATHETERIZATION  Result Date: 03/21/2022 See surgical note for result.  CT CHEST ABDOMEN PELVIS W CONTRAST  Result Date: 03/17/2022 CLINICAL DATA:  Esophageal cancer, staging. EXAM: CT CHEST, ABDOMEN, AND PELVIS WITH CONTRAST TECHNIQUE: Multidetector CT imaging of the chest, abdomen and pelvis was performed following the standard protocol during bolus administration of intravenous contrast. RADIATION DOSE REDUCTION: This exam was performed according to the departmental dose-optimization program which includes automated exposure control, adjustment of the mA and/or kV according to patient size and/or use of iterative reconstruction technique. CONTRAST:  119m OMNIPAQUE IOHEXOL 300 MG/ML  SOLN COMPARISON:  CT November 12, 2010 FINDINGS: CT CHEST FINDINGS Cardiovascular: Aortic atherosclerosis without thoracic aortic aneurysm. No central pulmonary embolus on this nondedicated study. Coronary artery calcifications. No significant pericardial effusion/thickening. Normal size heart. Mediastinum/Nodes: No suspicious thyroid nodule. Prominent/enlarged paraesophageal and paratracheal mediastinal lymph nodes. For reference: -lobular left upper paratracheal lymph node near the thoracic inlet measures  11 mm in short axis on image 9/2. -left lower paraesophageal lymph node near the hiatus measures 11  mm in short axis on image 42/2. Asymmetric thickening of the distal esophagus measures proximally 5.0 cm in length on coronal image 81/4 and 3.2 x 2.4 cm in maximum axial dimension on image 44/2 with the asymmetric thickening extending along the lesser curvature of the stomach for instance on axial image 51/2. No pathologically enlarged hilar or axillary lymph nodes. Lungs/Pleura: No suspicious pulmonary nodules or masses. No focal airspace consolidation. No pleural effusion. No pneumothorax. Musculoskeletal: No chest wall mass or suspicious bone lesions identified. Thoracic spondylosis. CT ABDOMEN PELVIS FINDINGS Hepatobiliary: No suspicious hepatic lesion. Gallbladder is unremarkable. No biliary ductal dilation. Pancreas: No pancreatic ductal dilation or evidence of acute inflammation. Spleen: No splenomegaly or focal splenic lesion. Adrenals/Urinary Tract: Bilateral adrenal glands appear normal. No hydronephrosis. No suspicious renal mass. Urinary bladder is unremarkable for degree of degree of distension. Stomach/Bowel: Radiopaque enteric contrast material traverses distal loops of small bowel. Stomach is distended with ingested material, contrast and gas with some asymmetric wall thickening extending from the distal esophageal mass along the cardia/fundus along the lesser curvature of the stomach. No pathologic dilation of small or large bowel. Moderate volume of formed stool throughout the colon suggestive of constipation. Vascular/Lymphatic: Gastrohepatic and retroperitoneal adenopathy to the level of the renal veins. For reference: -gastrohepatic lymph node measures 13 mm in short axis on image 52/2 -lobular left periaortic lymph node at the level of the left renal hilum measures 17 mm in short axis on image 66/2. No pathologically enlarged pelvic lymph nodes. Aortic atherosclerosis without aneurysmal dilation. Reproductive: Status post hysterectomy. No adnexal masses. Other: No significant abdominopelvic  free fluid. No discrete peritoneal or omental nodularity. Musculoskeletal: No aggressive lytic or blastic lesion of bone. IMPRESSION: 1. Asymmetric distal esophageal wall thickening, consistent with patient's known esophageal neoplasm. With the thickening appearing to extend along the lesser curvature of the stomach through the gastric cardia and proximal fundus with at least a portion of the thickening related to underdistention of the stomach. 2. Paratracheal/paraesophageal, gastrohepatic and retroperitoneal adenopathy, is most consistent with nodal disease involvement. 3. No evidence of solid organ or osseous metastatic disease in the chest, abdomen or pelvis. 4.  Aortic Atherosclerosis (ICD10-I70.0). Electronically Signed   By: Dahlia Bailiff M.D.   On: 03/17/2022 17:02     Assessment and plan- Patient is a 61 y.o. 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 Estelle Grumbles.  He is recommending proceeding with cisplatin instead of carboplatin along with etoposide and adding Tecentriq like we will treat an extensive stage small cell lung cancer.  Patient will therefore proceed with cisplatin at 75 mg per metered squared tomorrow along with etoposide and 3 days consecutively.  Tecentriq will be given on day 1 of each cycle as well.  Plan is to do 4 cycles IV every 3 weeks followed by repeat PET scan at [facility] and decide if surgery is a possibility.  Discussed risks and merits of chemotherapy including all but not limited to nausea, vomiting, low blood counts, risk of infections and hospitalization.  Risk of peripheral neuropathy hearing loss and kidney injury associated with cisplatin.  Treatment is being given with a palliative intent.  Discussed risks and benefits of immunotherapy including all  but not  limited to autoimmune side effects such as autoimmune thyroiditis colitis hepatitis and other endocrinopathies.  Patient understands and agrees to proceed as planned.  No plan for concurrent radiation treatment at this time.  I will see her back in about 12 days time to see how she tolerated chemotherapy and for possible IV fluids and she will be seen by covering NP in 3 weeks for cycle 2 of chemotherapy.  She will be seen a day prior and chemotherapy will be a day later.   Visit Diagnosis 1. Primary malignant neuroendocrine neoplasm of esophagus ([place])   2. Encounter for antineoplastic chemotherapy   3. Encounter for antineoplastic immunotherapy      Dr. Randa Evens, MD, MPH [facility] [phone] 03/29/2022 11:53 AM

## 2022-03-30 ENCOUNTER — Inpatient Hospital Stay (HOSPITAL_BASED_OUTPATIENT_CLINIC_OR_DEPARTMENT_OTHER): Payer: BC Managed Care – PPO | Admitting: Hospice and Palliative Medicine

## 2022-03-30 ENCOUNTER — Encounter: Payer: Self-pay | Admitting: Oncology

## 2022-03-30 ENCOUNTER — Other Ambulatory Visit: Payer: Self-pay | Admitting: *Deleted

## 2022-03-30 ENCOUNTER — Inpatient Hospital Stay: Payer: BC Managed Care – PPO

## 2022-03-30 ENCOUNTER — Other Ambulatory Visit: Payer: Self-pay | Admitting: Oncology

## 2022-03-30 ENCOUNTER — Other Ambulatory Visit: Payer: Self-pay

## 2022-03-30 VITALS — BP 100/58 | HR 79 | Temp 98.3°F | Resp 17 | Wt 156.6 lb

## 2022-03-30 DIAGNOSIS — D508 Other iron deficiency anemias: Secondary | ICD-10-CM

## 2022-03-30 DIAGNOSIS — D649 Anemia, unspecified: Secondary | ICD-10-CM

## 2022-03-30 DIAGNOSIS — C7A8 Other malignant neuroendocrine tumors: Secondary | ICD-10-CM

## 2022-03-30 DIAGNOSIS — Z5111 Encounter for antineoplastic chemotherapy: Secondary | ICD-10-CM | POA: Diagnosis not present

## 2022-03-30 LAB — COMPREHENSIVE METABOLIC PANEL
ALT: 11 U/L (ref 0–44)
AST: 15 U/L (ref 15–41)
Albumin: 3.6 g/dL (ref 3.5–5.0)
Alkaline Phosphatase: 60 U/L (ref 38–126)
Anion gap: 6 (ref 5–15)
BUN: 16 mg/dL (ref 8–23)
CO2: 25 mmol/L (ref 22–32)
Calcium: 8.8 mg/dL — ABNORMAL LOW (ref 8.9–10.3)
Chloride: 106 mmol/L (ref 98–111)
Creatinine, Ser: 0.79 mg/dL (ref 0.44–1.00)
GFR, Estimated: >60 mL/min (ref >60)
Glucose, Bld: 109 mg/dL — ABNORMAL HIGH (ref 70–99)
Potassium: 3.9 mmol/L (ref 3.5–5.1)
Sodium: 137 mmol/L (ref 135–145)
Total Bilirubin: 0.2 mg/dL — ABNORMAL LOW (ref 0.3–1.2)
Total Protein: 6.9 g/dL (ref 6.5–8.1)

## 2022-03-30 LAB — CBC WITH DIFFERENTIAL/PLATELET
Abs Immature Granulocytes: 0.04 10*3/uL (ref 0.00–0.07)
Basophils Absolute: 0.1 10*3/uL (ref 0.0–0.1)
Basophils Relative: 1 %
Eosinophils Absolute: 0.4 10*3/uL (ref 0.0–0.5)
Eosinophils Relative: 5 %
HCT: 23.2 % — ABNORMAL LOW (ref 36.0–46.0)
Hemoglobin: 7.2 g/dL — ABNORMAL LOW (ref 12.0–15.0)
Immature Granulocytes: 1 %
Lymphocytes Relative: 21 %
Lymphs Abs: 1.7 10*3/uL (ref 0.7–4.0)
MCH: 27.8 pg (ref 26.0–34.0)
MCHC: 31 g/dL (ref 30.0–36.0)
MCV: 89.6 fL (ref 80.0–100.0)
Monocytes Absolute: 0.9 10*3/uL (ref 0.1–1.0)
Monocytes Relative: 11 %
Neutro Abs: 5 10*3/uL (ref 1.7–7.7)
Neutrophils Relative %: 61 %
Platelets: 350 10*3/uL (ref 150–400)
RBC: 2.59 MIL/uL — ABNORMAL LOW (ref 3.87–5.11)
RDW: 13.9 % (ref 11.5–15.5)
WBC: 8 10*3/uL (ref 4.0–10.5)
nRBC: 0 % (ref 0.0–0.2)

## 2022-03-30 LAB — ABO/RH: ABO/RH(D): B POS

## 2022-03-30 LAB — MAGNESIUM: Magnesium: 2.3 mg/dL (ref 1.7–2.4)

## 2022-03-30 LAB — PREPARE RBC (CROSSMATCH)

## 2022-03-30 MED ORDER — HEPARIN SOD (PORK) LOCK FLUSH 100 UNIT/ML IV SOLN
INTRAVENOUS | Status: AC
  Administered 2022-03-30: 500 [IU]
  Filled 2022-03-30: qty 5

## 2022-03-30 MED ORDER — SODIUM CHLORIDE 0.9 % IV SOLN
10.0000 mg | Freq: Once | INTRAVENOUS | Status: AC
  Administered 2022-03-30: 10 mg via INTRAVENOUS
  Filled 2022-03-30: qty 10

## 2022-03-30 MED ORDER — SODIUM CHLORIDE 0.9 % IV SOLN
100.0000 mg/m2 | Freq: Once | INTRAVENOUS | Status: AC
  Administered 2022-03-30: 170 mg via INTRAVENOUS
  Filled 2022-03-30: qty 8.5

## 2022-03-30 MED ORDER — SODIUM CHLORIDE 0.9 % IV SOLN
150.0000 mg | Freq: Once | INTRAVENOUS | Status: AC
  Administered 2022-03-30: 150 mg via INTRAVENOUS
  Filled 2022-03-30: qty 150

## 2022-03-30 MED ORDER — MAGNESIUM SULFATE 2 GM/50ML IV SOLN
2.0000 g | Freq: Once | INTRAVENOUS | Status: AC
  Administered 2022-03-30: 2 g via INTRAVENOUS
  Filled 2022-03-30: qty 50

## 2022-03-30 MED ORDER — DEXAMETHASONE 4 MG PO TABS: ORAL_TABLET | ORAL | 1 refills | Status: DC

## 2022-03-30 MED ORDER — PALONOSETRON HCL INJECTION 0.25 MG/5ML
0.2500 mg | Freq: Once | INTRAVENOUS | Status: AC
  Administered 2022-03-30: 0.25 mg via INTRAVENOUS
  Filled 2022-03-30: qty 5

## 2022-03-30 MED ORDER — DEXAMETHASONE 4 MG PO TABS: 8.0000 mg | ORAL_TABLET | Freq: Every day | ORAL | 1 refills | Status: DC

## 2022-03-30 MED ORDER — SODIUM CHLORIDE 0.9 % IV SOLN
Freq: Once | INTRAVENOUS | Status: AC
  Filled 2022-03-30: qty 250

## 2022-03-30 MED ORDER — SODIUM CHLORIDE 0.9 % IV SOLN
75.0000 mg/m2 | Freq: Once | INTRAVENOUS | Status: AC
  Administered 2022-03-30: 128 mg via INTRAVENOUS
  Filled 2022-03-30: qty 128

## 2022-03-30 MED ORDER — POTASSIUM CHLORIDE IN NACL 20-0.9 MEQ/L-% IV SOLN
Freq: Once | INTRAVENOUS | Status: AC
  Filled 2022-03-30: qty 1000

## 2022-03-30 MED ORDER — HEPARIN SOD (PORK) LOCK FLUSH 100 UNIT/ML IV SOLN
500.0000 [IU] | Freq: Once | INTRAVENOUS | Status: AC | PRN
  Filled 2022-03-30: qty 5

## 2022-03-30 NOTE — Patient Instructions (Signed)
[facility] CANCER CTR AT [place]  Discharge Instructions: Thank you for choosing [facility] to provide your oncology and hematology care.  If you have a lab appointment with the [facility], please go directly to the [facility] and check in at the registration area.  Wear comfortable clothing and clothing appropriate for easy access to any Portacath or PICC line.   We strive to give you quality time with your provider. You may need to reschedule your appointment if you arrive late (15 or more minutes).  Arriving late affects you and other patients whose appointments are after yours.  Also, if you miss three or more appointments without notifying the office, you may be dismissed from the clinic at the provider's discretion.      For prescription refill requests, have your pharmacy contact our office and allow 72 hours for refills to be completed.    Today you received the following chemotherapy and/or immunotherapy agents Etoposide, Cisplatin   To help prevent nausea and vomiting after your treatment, we encourage you to take your nausea medication as directed.  BELOW ARE SYMPTOMS THAT SHOULD BE REPORTED IMMEDIATELY: *FEVER GREATER THAN 100.4 F (38 C) OR HIGHER *CHILLS OR SWEATING *NAUSEA AND VOMITING THAT IS NOT CONTROLLED WITH YOUR NAUSEA MEDICATION *UNUSUAL SHORTNESS OF BREATH *UNUSUAL BRUISING OR BLEEDING *URINARY PROBLEMS (pain or burning when urinating, or frequent urination) *BOWEL PROBLEMS (unusual diarrhea, constipation, pain near the anus) TENDERNESS IN MOUTH AND THROAT WITH OR WITHOUT PRESENCE OF ULCERS (sore throat, sores in mouth, or a toothache) UNUSUAL RASH, SWELLING OR PAIN  UNUSUAL VAGINAL DISCHARGE OR ITCHING   Items with * indicate a potential emergency and should be followed up as soon as possible or go to the Emergency Department if any problems should occur.  Please show the CHEMOTHERAPY ALERT CARD or IMMUNOTHERAPY ALERT CARD at  check-in to the Emergency Department and triage nurse.  Should you have questions after your visit or need to cancel or reschedule your appointment, please contact [facility] CANCER [place] AT [place]  [phone] and follow the prompts.  Office hours are 8:00 a.m. to 4:30 p.m. Monday - Friday. Please note that voicemails left after 4:00 p.m. may not be returned until the following business day.  We are closed weekends and major holidays. You have access to a nurse at all times for urgent questions. Please call the main number to the clinic [phone] and follow the prompts.  For any non-urgent questions, you may also contact your provider using MyChart. We now offer e-Visits for anyone 9 and older to request care online for non-urgent symptoms. For details visit [email or web address].   Also download the MyChart app! Go to the app store, search "MyChart", open the app, select [place], and log in with your MyChart username and password.  Masks are optional in the cancer centers. If you would like for your care team to wear a mask while they are taking care of you, please let them know. For doctor visits, patients may have with them one support person who is at least 61 years old. At this time, visitors are not allowed in the infusion area.  Etoposide, VP-16 capsules What is this medication? ETOPOSIDE, VP-16 (e toe POE side) is a chemotherapy drug. It is used to treat small cell lung cancer and other cancers. This medicine may be used for other purposes; ask your health care provider or pharmacist if you have questions. COMMON BRAND NAME(S): VePesid What should I tell my care  team before I take this medication? They need to know if you have any of these conditions: infection kidney disease liver disease low blood counts, like low white cell, platelet, or red cell counts an unusual or allergic reaction to etoposide, other medicines, foods, dyes, or preservatives pregnant or  trying to get pregnant breast-feeding How should I use this medication? Take this medicine by mouth with a glass of water. Follow the directions on the prescription label. Do not open, crush, or chew the capsules. It is advisable to wear gloves when handling this medicine. Take your medicine at regular intervals. Do not take it more often than directed. Do not stop taking except on your doctor's advice. Talk to your pediatrician regarding the use of this medicine in children. Special care may be needed. Overdosage: If you think you have taken too much of this medicine contact a poison control center or emergency room at once. NOTE: This medicine is only for you. Do not share this medicine with others. What if I miss a dose? If you miss a dose, take it as soon as you can. If it is almost time for your next dose, take only that dose. Do not take double or extra doses. What may interact with this medication? This medicine may interact with the following medications: cyclosporine warfarin This list may not describe all possible interactions. Give your health care provider a list of all the medicines, herbs, non-prescription drugs, or dietary supplements you use. Also tell them if you smoke, drink alcohol, or use illegal drugs. Some items may interact with your medicine. What should I watch for while using this medication? Visit your doctor for checks on your progress. This drug may make you feel generally unwell. This is not uncommon, as chemotherapy can affect healthy cells as well as cancer cells. Report any side effects. Continue your course of treatment even though you feel ill unless your doctor tells you to stop. In some cases, you may be given additional medicines to help with side effects. Follow all directions for their use. Call your doctor or health care professional for advice if you get a fever, chills or sore throat, or other symptoms of a cold or flu. Do not treat yourself. This drug  decreases your body's ability to fight infections. Try to avoid being around people who are sick. This medicine may increase your risk to bruise or bleed. Call your doctor or health care professional if you notice any unusual bleeding. Talk to your doctor about your risk of cancer. You may be more at risk for certain types of cancers if you take this medicine. Do not become pregnant while taking this medicine or for at least 6 months after stopping it. Women should inform their doctor if they wish to become pregnant or think they might be pregnant. Women of child-bearing potential will need to have a negative pregnancy test before starting this medicine. There is a potential for serious side effects to an unborn child. Talk to your health care professional or pharmacist for more information. Do not breast-feed an infant while taking this medicine. Men must use a latex condom during sexual contact with a woman while taking this medicine and for at least 4 months after stopping it. A latex condom is needed even if you have had a vasectomy. Contact your doctor right away if your partner becomes pregnant. Do not donate sperm while taking this medicine and for 4 months after you stop taking this medicine. Men should inform  their doctors if they wish to father a child. This medicine may lower sperm counts. What side effects may I notice from receiving this medication? Side effects that you should report to your doctor or health care professional as soon as possible: allergic reactions like skin rash, itching or hives, swelling of the face, lips, or tongue low blood counts - this medicine may decrease the number of white blood cells, red blood cells, and platelets. You may be at increased risk for infections and bleeding nausea, vomiting redness, blistering, peeling or loosening of the skin, including inside the mouth signs and symptoms of infection like fever; chills; cough; sore throat; pain or trouble passing  urine signs and symptoms of low red blood cells or anemia such as unusually weak or tired; feeling faint or lightheaded; falls; breathing problems unusual bruising or bleeding Side effects that usually do not require medical attention (report to your doctor or health care professional if they continue or are bothersome): changes in taste diarrhea hair loss loss of appetite mouth sores This list may not describe all possible side effects. Call your doctor for medical advice about side effects. You may report side effects to FDA at 1-800-FDA-1088. Where should I keep my medication? Keep out of the reach of children. Store in a refrigerator between 2 and 8 degrees C (36 and 46 degrees F). Do not freeze. Throw away any unused medicine after the expiration date. NOTE: This sheet is a summary. It may not cover all possible information. If you have questions about this medicine, talk to your doctor, pharmacist, or health care provider.  2023 Elsevier/Gold Standard (2021-07-30 00:00:00)  Cisplatin injection What is this medication? CISPLATIN (SIS pla tin) is a chemotherapy drug. It targets fast dividing cells, like cancer cells, and causes these cells to die. This medicine is used to treat many types of cancer like bladder, ovarian, and testicular cancers. This medicine may be used for other purposes; ask your health care provider or pharmacist if you have questions. COMMON BRAND NAME(S): Platinol, Platinol -AQ What should I tell my care team before I take this medication? They need to know if you have any of these conditions: eye disease, vision problems hearing problems kidney disease low blood counts, like white cells, platelets, or red blood cells tingling of the fingers or toes, or other nerve disorder an unusual or allergic reaction to cisplatin, carboplatin, oxaliplatin, other medicines, foods, dyes, or preservatives pregnant or trying to get pregnant breast-feeding How should I use  this medication? This drug is given as an infusion into a vein. It is administered in a hospital or clinic by a specially trained health care professional. Talk to your pediatrician regarding the use of this medicine in children. Special care may be needed. Overdosage: If you think you have taken too much of this medicine contact a poison control center or emergency room at once. NOTE: This medicine is only for you. Do not share this medicine with others. What if I miss a dose? It is important not to miss a dose. Call your doctor or health care professional if you are unable to keep an appointment. What may interact with this medication? This medicine may interact with the following medications: foscarnet certain antibiotics like amikacin, gentamicin, neomycin, polymyxin B, streptomycin, tobramycin, vancomycin This list may not describe all possible interactions. Give your health care provider a list of all the medicines, herbs, non-prescription drugs, or dietary supplements you use. Also tell them if you smoke, drink alcohol, or  use illegal drugs. Some items may interact with your medicine. What should I watch for while using this medication? Your condition will be monitored carefully while you are receiving this medicine. You will need important blood work done while you are taking this medicine. This drug may make you feel generally unwell. This is not uncommon, as chemotherapy can affect healthy cells as well as cancer cells. Report any side effects. Continue your course of treatment even though you feel ill unless your doctor tells you to stop. This medicine may increase your risk of getting an infection. Call your healthcare professional for advice if you get a fever, chills, or sore throat, or other symptoms of a cold or flu. Do not treat yourself. Try to avoid being around people who are sick. Avoid taking medicines that contain aspirin, acetaminophen, ibuprofen, naproxen, or ketoprofen  unless instructed by your healthcare professional. These medicines may hide a fever. This medicine may increase your risk to bruise or bleed. Call your doctor or health care professional if you notice any unusual bleeding. Be careful brushing and flossing your teeth or using a toothpick because you may get an infection or bleed more easily. If you have any dental work done, tell your dentist you are receiving this medicine. Do not become pregnant while taking this medicine or for 14 months after stopping it. Women should inform their healthcare professional if they wish to become pregnant or think they might be pregnant. Men should not father a child while taking this medicine and for 11 months after stopping it. There is potential for serious side effects to an unborn child. Talk to your healthcare professional for more information. Do not breast-feed an infant while taking this medicine. This medicine has caused ovarian failure in some women. This medicine may make it more difficult to get pregnant. Talk to your healthcare professional if you are concerned about your fertility. This medicine has caused decreased sperm counts in some men. This may make it more difficult to father a child. Talk to your healthcare professional if you are concerned about your fertility. Drink fluids as directed while you are taking this medicine. This will help protect your kidneys. Call your doctor or health care professional if you get diarrhea. Do not treat yourself. What side effects may I notice from receiving this medication? Side effects that you should report to your doctor or health care professional as soon as possible: allergic reactions like skin rash, itching or hives, swelling of the face, lips, or tongue blurred vision changes in vision decreased hearing or ringing of the ears nausea, vomiting pain, redness, or irritation at site where injected pain, tingling, numbness in the hands or feet signs and  symptoms of bleeding such as bloody or black, tarry stools; red or dark brown urine; spitting up blood or brown material that looks like coffee grounds; red spots on the skin; unusual bruising or bleeding from the eyes, gums, or nose signs and symptoms of infection like fever; chills; cough; sore throat; pain or trouble passing urine signs and symptoms of kidney injury like trouble passing urine or change in the amount of urine signs and symptoms of low red blood cells or anemia such as unusually weak or tired; feeling faint or lightheaded; falls; breathing problems Side effects that usually do not require medical attention (report to your doctor or health care professional if they continue or are bothersome): loss of appetite mouth sores muscle cramps This list may not describe all possible side effects.  Call your doctor for medical advice about side effects. You may report side effects to FDA at 1-800-FDA-1088. Where should I keep my medication? This drug is given in a hospital or clinic and will not be stored at home. NOTE: This sheet is a summary. It may not cover all possible information. If you have questions about this medicine, talk to your doctor, pharmacist, or health care provider.  2023 Elsevier/Gold Standard (2021-07-30 00:00:00)

## 2022-03-30 NOTE — Progress Notes (Signed)
Per Dr Janese Banks to proceed with treatment today with hgb 7.2. Hold tube drawn and sent to lab for future blood transfusion this week. Pt updated and all questions answered.   Ariana Wilson [employer]

## 2022-03-30 NOTE — Progress Notes (Signed)
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 The [facility] considered available treatment options and made the following recommendations/referrals:  SW, nutrition. Consider palliative care and SLP if needed   The MOC is a meeting of clinicians from various specialty areas who evaluate and discuss patients for whom a multidisciplinary approach is being considered. Final determinations in the plan of care are those of the provider(s).   Today's extended care, comprehensive team conference, Ariana Wilson was not present for the discussion and was not examined.

## 2022-03-31 ENCOUNTER — Telehealth: Payer: Self-pay

## 2022-03-31 ENCOUNTER — Inpatient Hospital Stay: Payer: BC Managed Care – PPO

## 2022-03-31 ENCOUNTER — Encounter: Payer: Self-pay | Admitting: Licensed Clinical Social Worker

## 2022-03-31 ENCOUNTER — Other Ambulatory Visit: Payer: Self-pay | Admitting: Oncology

## 2022-03-31 VITALS — BP 100/61 | HR 54 | Temp 97.2°F | Resp 18

## 2022-03-31 DIAGNOSIS — D509 Iron deficiency anemia, unspecified: Secondary | ICD-10-CM | POA: Insufficient documentation

## 2022-03-31 DIAGNOSIS — C7A8 Other malignant neuroendocrine tumors: Secondary | ICD-10-CM

## 2022-03-31 DIAGNOSIS — Z5111 Encounter for antineoplastic chemotherapy: Secondary | ICD-10-CM | POA: Diagnosis not present

## 2022-03-31 DIAGNOSIS — D508 Other iron deficiency anemias: Secondary | ICD-10-CM

## 2022-03-31 DIAGNOSIS — D649 Anemia, unspecified: Secondary | ICD-10-CM

## 2022-03-31 MED ORDER — SODIUM CHLORIDE 0.9 % IV SOLN
Freq: Once | INTRAVENOUS | Status: AC
  Filled 2022-03-31: qty 250

## 2022-03-31 MED ORDER — HEPARIN SOD (PORK) LOCK FLUSH 100 UNIT/ML IV SOLN
500.0000 [IU] | Freq: Once | INTRAVENOUS | Status: AC | PRN
  Filled 2022-03-31: qty 5

## 2022-03-31 MED ORDER — SODIUM CHLORIDE 0.9 % IV SOLN
100.0000 mg/m2 | Freq: Once | INTRAVENOUS | Status: AC
  Administered 2022-03-31: 170 mg via INTRAVENOUS
  Filled 2022-03-31: qty 8.5

## 2022-03-31 MED ORDER — SODIUM CHLORIDE 0.9 % IV SOLN
10.0000 mg | Freq: Once | INTRAVENOUS | Status: AC
  Administered 2022-03-31: 10 mg via INTRAVENOUS
  Filled 2022-03-31: qty 1

## 2022-03-31 MED ORDER — HEPARIN SOD (PORK) LOCK FLUSH 100 UNIT/ML IV SOLN
INTRAVENOUS | Status: AC
  Administered 2022-03-31: 500 [IU]
  Filled 2022-03-31: qty 5

## 2022-03-31 MED ORDER — SODIUM CHLORIDE 0.9% IV SOLUTION
250.0000 mL | Freq: Once | INTRAVENOUS | Status: AC
  Administered 2022-03-31: 250 mL via INTRAVENOUS
  Filled 2022-03-31: qty 250

## 2022-03-31 MED FILL — Iron Sucrose Inj 20 MG/ML (Fe Equiv): INTRAVENOUS | Qty: 10 | Status: AC

## 2022-03-31 MED FILL — Dexamethasone Sodium Phosphate Inj 100 MG/10ML: INTRAMUSCULAR | Qty: 1 | Status: AC

## 2022-03-31 NOTE — Telephone Encounter (Signed)
Telephone call to patient for follow up after receiving first infusion.   Patient states infusion went great.  States eating good and drinking plenty of fluids.   Denies any nausea or vomiting.  Encouraged patient to call for any concerns or questions. 

## 2022-03-31 NOTE — Progress Notes (Signed)
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  Patient receiving neoadjuvant chemotherapy.  No radiation.   Met with patient today with friend Estill Bamberg during treatment.  Patient reports that she has lost about 10 lb (starting mid May) but thinks she may have gained some weight back.  Eating mostly soggy cereal, premier protein shake for breakfast.  Able to tolerate instant mashed potatoes, soups, grilled cheese (soft) dipped in tomato soup until soggy, pudding, jello, applesauce, milkshakes, halo top ice cream, peanut butter, yogurt.  Reports that she gets full quickly.  Has been eating smaller, more frequent meals.   Medications: calcium carbonate/Vit D, decadron, ativan, fish oil, prilosec, zofran, protonix, compazine, probiotic   Labs: reviewed   Anthropometrics:   Height: 60 inches Weight: 156 lb 10.2 oz on 7/19 167 lb on 06/29/21 10 lb weight loss since May per patient BMI: 30 6% weight loss in the last 2 months, concerning  Estimated Energy Needs  Kcals: 1775-2100 Protein: 88-105 g Fluid: 1775-2135m   NUTRITION DIAGNOSIS: Inadequate oral intake related to neuroendocrine tumor in GE junction as evidenced by dysphagia, 6% weight loss in 2 months and decreased intake   INTERVENTION:  Recommend switching to 350 calorie shake. Sample of ensure complete, boost VHC, carnation instant breakfast, orgain, [employer] 1.4, orgain protein powder given along with coupons Encouraged pureeing, grinding, chopping foods for ease of swallowing Encouraged adding calories and protein (whole milk, half and half, butters, gravies, etc). Likes almond milk encouraged trying almond and cashew milk blend with 10 g protein added.   Handout given on soft, moist protein foods Recipe booklet given  to patient (includes smoothie recipes) Contact information given to patient    MONITORING, EVALUATION, GOAL: weight trends, intake   Next Visit: Friday, July 28 during infusion  Tamiyah Moulin B. AZenia Resides [place] LG[place]burgRegistered Dietitian [phone]

## 2022-03-31 NOTE — Progress Notes (Signed)
[facility] CSW Progress Note  Clinical Social Worker [place] Work  Clinical Social Work was referred by medical provider for assessment of psychosocial needs.  Clinical Social Worker attempted to contact patient by phone  to offer support and assess for needs.  CSW left voicemail with contact information, with request for return call.  1st attempt  Ariana Wilson, Ariana Wilson Worker [facility]

## 2022-03-31 NOTE — Patient Instructions (Signed)
[facility]CER CTR AT [place]-MEDICAL ONCOLOGY  Discharge Instructions: Thank you for choosing [facility] to provide your oncology and hematology care.  If you have a lab appointment with the Cancer Center, please go directly to the Cancer Center and check in at the registration area.  Wear comfortable clothing and clothing appropriate for easy access to any Portacath or PICC line.   We strive to give you quality time with your provider. You may need to reschedule your appointment if you arrive late (15 or more minutes).  Arriving late affects you and other patients whose appointments are after yours.  Also, if you miss three or more appointments without notifying the office, you may be dismissed from the clinic at the provider's discretion.      For prescription refill requests, have your pharmacy contact our office and allow 72 hours for refills to be completed.    Today you received the following chemotherapy and/or immunotherapy agents :etoposide   To help prevent nausea and vomiting after your treatment, we encourage you to take your nausea medication as directed.  BELOW ARE SYMPTOMS THAT SHOULD BE REPORTED IMMEDIATELY: *FEVER GREATER THAN 100.4 F (38 C) OR HIGHER *CHILLS OR SWEATING *NAUSEA AND VOMITING THAT IS NOT CONTROLLED WITH YOUR NAUSEA MEDICATION *UNUSUAL SHORTNESS OF BREATH *UNUSUAL BRUISING OR BLEEDING *URINARY PROBLEMS (pain or burning when urinating, or frequent urination) *BOWEL PROBLEMS (unusual diarrhea, constipation, pain near the anus) TENDERNESS IN MOUTH AND THROAT WITH OR WITHOUT PRESENCE OF ULCERS (sore throat, sores in mouth, or a toothache) UNUSUAL RASH, SWELLING OR PAIN  UNUSUAL VAGINAL DISCHARGE OR ITCHING   Items with * indicate a potential emergency and should be followed up as soon as possible or go to the Emergency Department if any problems should occur.  Please show the CHEMOTHERAPY ALERT CARD or IMMUNOTHERAPY ALERT CARD at check-in to the  Emergency Department and triage nurse.  Should you have questions after your visit or need to cancel or reschedule your appointment, please contact [facility] AT [place]-[facility]ICAL ONCOLOGY  [phone] and follow the prompts.  Office hours are 8:00 a.m. to 4:30 p.m. Monday - Friday. Please note that voicemails left after 4:00 p.m. may not be returned until the following business day.  We are closed weekends and major holidays. You have access to a nurse at all times for urgent questions. Please call the main number to the clinic [phone] and follow the prompts.  For any non-urgent questions, you may also contact your provider using MyChart. We now offer e-Visits for anyone 18 and older to request care online for non-urgent symptoms. For details visit [email or web address].   Also download the MyChart app! Go to the app store, search "MyChart", open the app, select [facility], and log in with your MyChart username and password.  Masks are optional in the cancer centers. If you would like for your care team to wear a mask while they are taking care of you, please let them know. For doctor visits, patients may have with them one support person who is at least 61 years old. At this time, visitors are not allowed in the infusion area.   

## 2022-04-01 ENCOUNTER — Inpatient Hospital Stay: Payer: BC Managed Care – PPO

## 2022-04-01 VITALS — BP 124/66 | HR 65 | Temp 98.0°F | Resp 19

## 2022-04-01 DIAGNOSIS — Z5111 Encounter for antineoplastic chemotherapy: Secondary | ICD-10-CM | POA: Diagnosis not present

## 2022-04-01 DIAGNOSIS — C7A8 Other malignant neuroendocrine tumors: Secondary | ICD-10-CM

## 2022-04-01 DIAGNOSIS — D508 Other iron deficiency anemias: Secondary | ICD-10-CM

## 2022-04-01 LAB — BPAM RBC
Blood Product Expiration Date: 202308082359
ISSUE DATE / TIME: 202307201100
Unit Type and Rh: 7300

## 2022-04-01 LAB — TYPE AND SCREEN
ABO/RH(D): B POS
Antibody Screen: NEGATIVE
Unit division: 0

## 2022-04-01 MED ORDER — SODIUM CHLORIDE 0.9 % IV SOLN
200.0000 mg | INTRAVENOUS | Status: DC
  Administered 2022-04-01: 200 mg via INTRAVENOUS
  Filled 2022-04-01: qty 200

## 2022-04-01 MED ORDER — SODIUM CHLORIDE 0.9 % IV SOLN
Freq: Once | INTRAVENOUS | Status: AC
  Filled 2022-04-01: qty 250

## 2022-04-01 MED ORDER — HEPARIN SOD (PORK) LOCK FLUSH 100 UNIT/ML IV SOLN
INTRAVENOUS | Status: AC
  Filled 2022-04-01: qty 5

## 2022-04-01 MED ORDER — SODIUM CHLORIDE 0.9 % IV SOLN
100.0000 mg/m2 | Freq: Once | INTRAVENOUS | Status: AC
  Administered 2022-04-01: 170 mg via INTRAVENOUS
  Filled 2022-04-01: qty 8.5

## 2022-04-01 MED ORDER — SODIUM CHLORIDE 0.9 % IV SOLN
10.0000 mg | Freq: Once | INTRAVENOUS | Status: AC
  Administered 2022-04-01: 10 mg via INTRAVENOUS
  Filled 2022-04-01: qty 10

## 2022-04-01 MED ORDER — HEPARIN SOD (PORK) LOCK FLUSH 100 UNIT/ML IV SOLN
500.0000 [IU] | Freq: Once | INTRAVENOUS | Status: DC | PRN
  Filled 2022-04-01: qty 5

## 2022-04-01 NOTE — Patient Instructions (Signed)

## 2022-04-02 ENCOUNTER — Ambulatory Visit
Admission: RE | Admit: 2022-04-02 | Discharge: 2022-04-02 | Disposition: A | Discharge status: Home / Self Care | Payer: BC Managed Care – PPO | Source: Ambulatory Visit | Attending: Oncology | Admitting: Oncology

## 2022-04-02 ENCOUNTER — Other Ambulatory Visit: Payer: Self-pay | Admitting: Oncology

## 2022-04-02 DIAGNOSIS — C159 Malignant neoplasm of esophagus, unspecified: Secondary | ICD-10-CM | POA: Diagnosis present

## 2022-04-02 MED ORDER — AMOXICILLIN-POT CLAVULANATE 875-125 MG PO TABS: 1.0000 | ORAL_TABLET | Freq: Two times a day (BID) | ORAL | 0 refills | Status: DC

## 2022-04-02 MED ORDER — GADOBUTROL 1 MMOL/ML IV SOLN
7.0000 mL | Freq: Once | INTRAVENOUS | Status: AC | PRN
  Administered 2022-04-02: 7.5 mL via INTRAVENOUS

## 2022-04-04 ENCOUNTER — Telehealth: Payer: Self-pay | Admitting: *Deleted

## 2022-04-04 ENCOUNTER — Other Ambulatory Visit: Payer: Self-pay

## 2022-04-04 ENCOUNTER — Inpatient Hospital Stay (HOSPITAL_BASED_OUTPATIENT_CLINIC_OR_DEPARTMENT_OTHER): Payer: BC Managed Care – PPO | Admitting: Oncology

## 2022-04-04 ENCOUNTER — Inpatient Hospital Stay: Payer: BC Managed Care – PPO

## 2022-04-04 VITALS — BP 117/55 | HR 64 | Temp 98.6°F | Resp 16

## 2022-04-04 DIAGNOSIS — C7A8 Other malignant neuroendocrine tumors: Secondary | ICD-10-CM | POA: Diagnosis not present

## 2022-04-04 DIAGNOSIS — Z5111 Encounter for antineoplastic chemotherapy: Secondary | ICD-10-CM | POA: Diagnosis not present

## 2022-04-04 DIAGNOSIS — Z95828 Presence of other vascular implants and grafts: Secondary | ICD-10-CM

## 2022-04-04 DIAGNOSIS — D500 Iron deficiency anemia secondary to blood loss (chronic): Secondary | ICD-10-CM | POA: Diagnosis not present

## 2022-04-04 LAB — COMPREHENSIVE METABOLIC PANEL
ALT: 14 U/L (ref 0–44)
AST: 16 U/L (ref 15–41)
Albumin: 3.4 g/dL — ABNORMAL LOW (ref 3.5–5.0)
Alkaline Phosphatase: 46 U/L (ref 38–126)
Anion gap: 5 (ref 5–15)
BUN: 29 mg/dL — ABNORMAL HIGH (ref 8–23)
CO2: 26 mmol/L (ref 22–32)
Calcium: 8.1 mg/dL — ABNORMAL LOW (ref 8.9–10.3)
Chloride: 100 mmol/L (ref 98–111)
Creatinine, Ser: 0.79 mg/dL (ref 0.44–1.00)
GFR, Estimated: >60 mL/min (ref >60)
Glucose, Bld: 88 mg/dL (ref 70–99)
Potassium: 3.3 mmol/L — ABNORMAL LOW (ref 3.5–5.1)
Sodium: 131 mmol/L — ABNORMAL LOW (ref 135–145)
Total Bilirubin: 0.8 mg/dL (ref 0.3–1.2)
Total Protein: 6.2 g/dL — ABNORMAL LOW (ref 6.5–8.1)

## 2022-04-04 LAB — CBC WITH DIFFERENTIAL/PLATELET
Abs Immature Granulocytes: 0.02 10*3/uL (ref 0.00–0.07)
Basophils Absolute: 0 10*3/uL (ref 0.0–0.1)
Basophils Relative: 1 %
Eosinophils Absolute: 0.2 10*3/uL (ref 0.0–0.5)
Eosinophils Relative: 3 %
HCT: 25.3 % — ABNORMAL LOW (ref 36.0–46.0)
Hemoglobin: 8.3 g/dL — ABNORMAL LOW (ref 12.0–15.0)
Immature Granulocytes: 0 %
Lymphocytes Relative: 37 %
Lymphs Abs: 2.6 10*3/uL (ref 0.7–4.0)
MCH: 28.4 pg (ref 26.0–34.0)
MCHC: 32.8 g/dL (ref 30.0–36.0)
MCV: 86.6 fL (ref 80.0–100.0)
Monocytes Absolute: 0 10*3/uL — ABNORMAL LOW (ref 0.1–1.0)
Monocytes Relative: 0 %
Neutro Abs: 4.1 10*3/uL (ref 1.7–7.7)
Neutrophils Relative %: 59 %
Platelets: 343 10*3/uL (ref 150–400)
RBC: 2.92 MIL/uL — ABNORMAL LOW (ref 3.87–5.11)
RDW: 13.2 % (ref 11.5–15.5)
WBC: 7 10*3/uL (ref 4.0–10.5)
nRBC: 0 % (ref 0.0–0.2)

## 2022-04-04 MED ORDER — SODIUM CHLORIDE 0.9% FLUSH
10.0000 mL | Freq: Once | INTRAVENOUS | Status: AC
  Administered 2022-04-04: 10 mL via INTRAVENOUS
  Filled 2022-04-04: qty 10

## 2022-04-04 MED ORDER — HEPARIN SOD (PORK) LOCK FLUSH 100 UNIT/ML IV SOLN
500.0000 [IU] | Freq: Once | INTRAVENOUS | Status: AC
  Administered 2022-04-04: 500 [IU] via INTRAVENOUS
  Filled 2022-04-04: qty 5

## 2022-04-04 MED ORDER — SODIUM CHLORIDE 0.9 % IV SOLN
INTRAVENOUS | Status: DC
  Filled 2022-04-04 (×2): qty 250

## 2022-04-04 MED ORDER — POTASSIUM CHLORIDE CRYS ER 20 MEQ PO TBCR: 20.0000 meq | EXTENDED_RELEASE_TABLET | Freq: Two times a day (BID) | ORAL | 0 refills | Status: DC

## 2022-04-04 NOTE — Telephone Encounter (Signed)
Per Dr. Janese Banks- she is frail s/p 1 cycle of chemo. called over the weekend with some nausea. I see her again on Friday. please call and see how she is doing and if she needs to come in today for fluids  Contacted Patient and scheduled for port labs/ smc/ possible iv fluids for dehydration.  Pt reports ringing in her ears. She had this before chemo, but the ringing in the ears has become worse since her treatment. Also reports nausea and vomiting episodes on Friday. She reports improvement in N&V with antiemetics. Pt agreeable to smc evaluation.

## 2022-04-04 NOTE — Progress Notes (Signed)
Pt add-on to [facility]. Reports ringing in ears, but a little worse than baseline. She also reports that she had nausea and vomiting on Saturday, but has since resolved. Main concerns today are weakness and fatigue.

## 2022-04-04 NOTE — Progress Notes (Signed)
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 Haig Prophet on 03/01/2022 EGD showed a medium-sized ulcerating mass with no bleeding or stigmata of recent bleeding at the GE junction..  Mass was partially obstructing.  There is also mention of another ulcerated noncircumferential mass with oozing bleeding found at the GE junction with extension into the cardia.  Both these masses were biopsied.  Pathology was pending at the time of my visit with the patient and was reported to be a poorly differentiated carcinoma.  Patient is currently able to swallow soft foods without much difficulty.  However more solid foods occasionally feel stuck.    PET was denied by insurance.  CT chest abdomen and pelvis with contrast showed asymmetric distal esophageal wall thickening with thickening extending around the lesser curvature of the stomach through the gastric cardia and proximal fundus.  Paratracheal/paraesophageal, gastrohepatic and PET retroperitoneal adenopathy.  Left periaortic lymph node measuring 17 mm at the level of left renal hilum.   Final pathology showed there were 2 specimens collected.  Specimen a was poorly differentiated carcinoma with ulceration and necrosis.v verys weakly positive for synaptophysin 50% of the cells.  Cells negative for CK7 and CK23 for the BF1 CDX2 100 Melan-A HMB45 and CD20.  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 Had MRI of brain for staging which was negative for metastatic disease.   Presents today for concerns of intermittent ringing in her ears since starting chemo-mainly in left ear.  Also having some nausea without vomiting.  Taking Ativan and dexamethasone on day 4 following chemo. Has been eating and drinking fair.  States she knows she needs to push more fluids.  States she may have overdone it yesterday by going to church.  Denies any shortness of breath, dizziness, constipation or diarrhea.  Denies fevers.  Received a dose of IV iron and I unit PRBC on 04/01/22 for hemoglobin of 7.2.  ECOG FS:1 - Symptomatic but completely ambulatory  Review of systems- Review of Systems  Constitutional:  Positive for malaise/fatigue.  HENT:  Positive for tinnitus.   Gastrointestinal:  Positive for nausea.  Neurological:  Positive for weakness.  All other systems reviewed and are negative.    Current treatment- C1 Cisplatin/etopiside/tecentriq  Allergies  Allergen Reactions   Sulfa Antibiotics Hives    Says her tongue swelled a bit   Sulfonamide Derivatives Swelling    Tongue swelling     Past Medical History:  Diagnosis Date   Anemia    Arthritis    Atrial fibrillation (HCC)    B12 deficiency    Dysrhythmia    Hyperlipidemia    Hypothyroidism    Menopause    Palpitations    Hx of, 25 years ago  Vertigo      Past Surgical History:  Procedure Laterality Date   BILATERAL SALPINGOOPHORECTOMY  2008   DILATION AND CURETTAGE OF UTERUS  08/24/2006   ESOPHAGOGASTRODUODENOSCOPY (EGD) WITH PROPOFOL N/A 03/01/2022   Procedure: ESOPHAGOGASTRODUODENOSCOPY (EGD) WITH PROPOFOL;  Surgeon: Lesly Rubenstein, MD;  Location: [facility];  Service:  Endoscopy;  Laterality: N/A;   incision tendon sheath for trigger finger Right    ring finger   LAPAROSCOPIC SUPRACERVICAL HYSTERECTOMY  2008   Dr. Ammie Dalton; with BSO   NOSE SURGERY  03/2008   PORTA CATH INSERTION N/A 03/21/2022   Procedure: PORTA CATH INSERTION;  Surgeon: Algernon Huxley, MD;  Location: [facility];  Service: Cardiovascular;  Laterality: N/A;    Social History   Socioeconomic History   Marital status: Married    Spouse name: Not on file   Number of children: Not on file   Years of education: Not on file   Highest education level: Not on file  Occupational History   Occupation: Full time  Tobacco Use   Smoking status: Never   Smokeless tobacco: Never  Vaping Use   Vaping Use: Never used  Substance and Sexual Activity   Alcohol use: Never   Drug use: Never   Sexual activity: Not Currently    Birth control/protection: Surgical    Comment: Hysterectomy  Other Topics Concern   Not on file  Social History Narrative   Married with 2 children   Gets regular exercise   Social Determinants of Health   Financial Resource Strain: Not on file  Food Insecurity: Not on file  Transportation Needs: Not on file  Physical Activity: Not on file  Stress: Not on file  Social Connections: Not on file  Intimate Partner Violence: Not on file    Family History  Problem Relation Age of Onset   Hyperlipidemia Mother    Hypertension Mother    Stroke Mother    Heart disease Father    Hyperlipidemia Father    Atrial fibrillation Father    Melanoma Father    Arrhythmia Sister        Atrial fibrillation   Heart disease Sister    Atrial fibrillation Sister    Heart attack Brother    Breast cancer Neg Hx      Current Outpatient Medications:    potassium chloride SA (KLOR-CON M) 20 MEQ tablet, Take 1 tablet (20 mEq total) by mouth 2 (two) times daily., Disp: 14 tablet, Rfl: 0   amoxicillin-clavulanate (AUGMENTIN) 875-125 MG tablet, Take 1 tablet by mouth 2  (two) times daily., Disp: 14 tablet, Rfl: 0   aspirin 325 MG tablet, Take 325 mg by mouth once., Disp: , Rfl:    Calcium Carbonate-Vitamin D 600-200 MG-UNIT TABS, Take 1 tablet by mouth daily.  , Disp: , Rfl:    cetirizine (ZYRTEC) 10 MG tablet, Take 10 mg by mouth daily., Disp: , Rfl:    Cholecalciferol 25 MCG (1000 UT) tablet, Take by mouth., Disp: , Rfl:    dexamethasone (DECADRON) 4 MG tablet, Pt has 3 day chemo regimen and will need to take 2 tablets ( 23m) on day 4 for each cycle of chemo. Make sure to eat food in the am and then take the med, Disp: 30 tablet, Rfl: 1   DHEA 25 MG tablet, Take 25 mg by mouth daily., Disp: , Rfl:    diclofenac Sodium (VOLTAREN) 1 % GEL, Apply 2 g topically 4 (four) times daily., Disp: ,  Rfl:    diltiazem (TIAZAC) 180 MG 24 hr capsule, Take 180 mg by mouth daily., Disp: , Rfl:    estradiol (ESTRACE) 0.1 MG/GM vaginal cream, Place 1 Applicatorful vaginally at bedtime. (Patient not taking: Reported on 03/04/2022), Disp: , Rfl:    flecainide (TAMBOCOR) 50 MG tablet, Take 1 tablet by mouth 2 (two) times daily., Disp: , Rfl:    L-Lysine 1000 MG TABS, Take 1,000 mg by mouth daily. (Patient not taking: Reported on 03/29/2022), Disp: , Rfl:    lidocaine-prilocaine (EMLA) cream, Apply to affected area once, Disp: 30 g, Rfl: 3   LORazepam (ATIVAN) 0.5 MG tablet, Take 1 tablet (0.5 mg total) by mouth every 6 (six) hours as needed (Nausea or vomiting)., Disp: 30 tablet, Rfl: 0   meclizine (ANTIVERT) 25 MG tablet, Take 1 tablet (25 mg total) by mouth 3 (three) times daily as needed for dizziness. (Patient not taking: Reported on 03/04/2022), Disp: 30 tablet, Rfl: 0   metoprolol tartrate (LOPRESSOR) 25 MG tablet, TAKE 1 TABLET (25 MG TOTAL) BY MOUTH 2 (TWO) TIMES DAILY AS NEEDED., Disp: 60 tablet, Rfl: 5   mometasone (NASONEX) 50 MCG/ACT nasal spray, Place 2 sprays into the nose daily., Disp: , Rfl:    Omega-3 Fatty Acids (FISH OIL) 1200 MG CAPS, Take by mouth daily.  (Patient  not taking: Reported on 03/21/2022), Disp: , Rfl:    omeprazole (PRILOSEC) 20 MG capsule, , Disp: , Rfl:    ondansetron (ZOFRAN) 8 MG tablet, Take 1 tablet (8 mg total) by mouth 2 (two) times daily as needed. Start on the third day after cisplatin chemotherapy. (Patient not taking: Reported on 03/29/2022), Disp: 30 tablet, Rfl: 1   pantoprazole (PROTONIX) 40 MG tablet, Take 40 mg by mouth daily., Disp: , Rfl:    prochlorperazine (COMPAZINE) 10 MG tablet, Take 1 tablet (10 mg total) by mouth every 6 (six) hours as needed (Nausea or vomiting). (Patient not taking: Reported on 03/29/2022), Disp: 30 tablet, Rfl: 1   Saccharomyces boulardii (PROBIOTIC) 250 MG CAPS, Take by mouth., Disp: , Rfl:    simvastatin (ZOCOR) 20 MG tablet, TAKE 1 TABLET BY MOUTH EVERY EVENING., Disp: 30 tablet, Rfl: 0   traZODone (DESYREL) 50 MG tablet, Take 50 mg by mouth as needed.  (Patient not taking: Reported on 03/29/2022), Disp: , Rfl:    vitamin B-12 (CYANOCOBALAMIN) 1000 MCG tablet, Take 1,000 mcg by mouth daily., Disp: , Rfl:    Zinc Citrate-Phytase (ZYTAZE) 25-500 MG CAPS, Take 1 Dose by mouth daily., Disp: , Rfl:  No current facility-administered medications for this visit.  Facility-Administered Medications Ordered in Other Visits:    0.9 %  sodium chloride infusion, , Intravenous, Continuous, ,  E, NP, Last Rate: 999 mL/hr at 04/04/22 1216, New Bag at 04/04/22 1216   heparin lock flush 100 unit/mL, 500 Units, Intravenous, Once, ,  E, NP   sodium chloride flush (NS) 0.9 % injection 10 mL, 10 mL, Intravenous, Once, Jacquelin Hawking, NP  Physical exam:  Vitals:   04/04/22 1128  BP: (!) 117/55  Pulse: 64  Resp: 16  Temp: 98.6 F (37 C)  SpO2: 100%   Physical Exam Constitutional:      Appearance: Normal appearance.  HENT:     Head: Normocephalic and atraumatic.  Eyes:     Pupils: Pupils are equal, round, and reactive to light.  Cardiovascular:     Rate and Rhythm: Normal rate and  regular rhythm.     Heart sounds: Normal  heart sounds. No murmur heard. Pulmonary:     Effort: Pulmonary effort is normal.     Breath sounds: Normal breath sounds. No wheezing.  Abdominal:     General: Bowel sounds are normal. There is no distension.     Palpations: Abdomen is soft.     Tenderness: There is no abdominal tenderness.  Musculoskeletal:        General: Normal range of motion.     Cervical back: Normal range of motion.  Skin:    General: Skin is warm and dry.     Findings: No rash.  Neurological:     Mental Status: She is alert and oriented to person, place, and time.  Psychiatric:        Judgment: Judgment normal.         Latest Ref Rng & Units 04/04/2022   11:01 AM  CMP  Glucose 70 - 99 mg/dL 88   BUN 8 - 23 mg/dL 29   Creatinine 0.44 - 1.00 mg/dL 0.79   Sodium 135 - 145 mmol/L 131   Potassium 3.5 - 5.1 mmol/L 3.3   Chloride 98 - 111 mmol/L 100   CO2 22 - 32 mmol/L 26   Calcium 8.9 - 10.3 mg/dL 8.1   Total Protein 6.5 - 8.1 g/dL 6.2   Total Bilirubin 0.3 - 1.2 mg/dL 0.8   Alkaline Phos 38 - 126 U/L 46   AST 15 - 41 U/L 16   ALT 0 - 44 U/L 14       Latest Ref Rng & Units 04/04/2022   11:01 AM  CBC  WBC 4.0 - 10.5 K/uL 7.0   Hemoglobin 12.0 - 15.0 g/dL 8.3   Hematocrit 36.0 - 46.0 % 25.3   Platelets 150 - 400 K/uL 343     No images are attached to the encounter.  MR Brain W Wo Contrast  Result Date: 04/03/2022 CLINICAL DATA:  Esophageal cancer, intracranial staging EXAM: MRI HEAD WITHOUT AND WITH CONTRAST TECHNIQUE: Multiplanar, multiecho pulse sequences of the brain and surrounding structures were obtained without and with intravenous contrast. CONTRAST:  7.30m GADAVIST GADOBUTROL 1 MMOL/ML IV SOLN COMPARISON:  03/22/2022 PET-CT FINDINGS: Brain: No enhancement or swelling to suggest metastatic disease. Irregularly-shaped area of T2 hyperintensity in the right cerebral white matter with underlying developmental venous anomaly. No acute hemorrhage or  swelling superimposed. No acute infarct, hydrocephalus, or collection. Vascular: Major flow voids and vascular enhancements are preserved Skull and upper cervical spine: Negative for marrow lesion. Sinuses/Orbits: Negative IMPRESSION: 1. Negative for metastatic disease. 2. 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 Electronically Signed   By: JJorje GuildM.D.   On: 04/03/2022 08:24   NM PET Image Initial (PI) Skull Base To Thigh  Result Date: 03/22/2022 CLINICAL DATA:  Initial treatment strategy for esophageal carcinoma. EXAM: NUCLEAR MEDICINE PET SKULL BASE TO THIGH TECHNIQUE: 8.6 mCi F-18 FDG was injected intravenously. Full-ring PET imaging was performed from the skull base to thigh after the radiotracer. CT data was obtained and used for attenuation correction and anatomic localization. Fasting blood glucose: 109 mg/dl COMPARISON:  CT on 03/17/2022 FINDINGS: Mediastinal blood-pool activity (background): SUV max = 2.2 Liver activity (reference): SUV max = N/A NECK: 1.1 cm left level 4 lower jugular lymph node on image 517/7is hypermetabolic, with SUV max of 5.3. 1.1 cm right supraclavicular lymph node on image 693/9is hypermetabolic, with SUV max of 5.1. Incidental  CT findings:  None. CHEST: Concentric hypermetabolic wall thickening is seen involving the distal thoracic esophagus and proximal stomach,, with SUV max of 11.0, consistent with known esophageal carcinoma. A 1 cm left paraesophageal lymph node is seen on image 111/2, which is hypermetabolic with SUV max of 3.9. No other hypermetabolic lymphadenopathy seen within the thorax. No suspicious pulmonary nodules seen on CT images. Incidental CT findings:  None. ABDOMEN/PELVIS: No abnormal hypermetabolic activity within the liver, pancreas, adrenal glands, or spleen. Mild hypermetabolic lymphadenopathy seen in the gastrohepatic ligament, which shows  SUV max of 6.8. 11 mm lymph node in porta hepatis is also hypermetabolic, with SUV max of 5.9. 1.8 cm left paraaortic lymph node is hypermetabolic, with SUV max of 7.9. No hypermetabolic lymph nodes seen within the pelvis. Incidental CT findings:  None. SKELETON: No focal hypermetabolic bone lesions to suggest skeletal metastasis. Incidental CT findings:  None. IMPRESSION: Hypermetabolic wall thickening involving the distal thoracic esophagus and proximal stomach, consistent with known esophageal carcinoma. Hypermetabolic lymphadenopathy in the neck, chest, and abdomen as described above, consistent with metastatic disease. Electronically Signed   By: Marlaine Hind M.D.   On: 03/22/2022 13:50   PERIPHERAL VASCULAR CATHETERIZATION  Result Date: 03/21/2022 See surgical note for result.  CT CHEST ABDOMEN PELVIS W CONTRAST  Result Date: 03/17/2022 CLINICAL DATA:  Esophageal cancer, staging. EXAM: CT CHEST, ABDOMEN, AND PELVIS WITH CONTRAST TECHNIQUE: Multidetector CT imaging of the chest, abdomen and pelvis was performed following the standard protocol during bolus administration of intravenous contrast. RADIATION DOSE REDUCTION: This exam was performed according to the departmental dose-optimization program which includes automated exposure control, adjustment of the mA and/or kV according to patient size and/or use of iterative reconstruction technique. CONTRAST:  170m OMNIPAQUE IOHEXOL 300 MG/ML  SOLN COMPARISON:  CT November 12, 2010 FINDINGS: CT CHEST FINDINGS Cardiovascular: Aortic atherosclerosis without thoracic aortic aneurysm. No central pulmonary embolus on this nondedicated study. Coronary artery calcifications. No significant pericardial effusion/thickening. Normal size heart. Mediastinum/Nodes: No suspicious thyroid nodule. Prominent/enlarged paraesophageal and paratracheal mediastinal lymph nodes. For reference: -lobular left upper paratracheal lymph node near the thoracic inlet measures 11 mm in short  axis on image 9/2. -left lower paraesophageal lymph node near the hiatus measures 11 mm in short axis on image 42/2. Asymmetric thickening of the distal esophagus measures proximally 5.0 cm in length on coronal image 81/4 and 3.2 x 2.4 cm in maximum axial dimension on image 44/2 with the asymmetric thickening extending along the lesser curvature of the stomach for instance on axial image 51/2. No pathologically enlarged hilar or axillary lymph nodes. Lungs/Pleura: No suspicious pulmonary nodules or masses. No focal airspace consolidation. No pleural effusion. No pneumothorax. Musculoskeletal: No chest wall mass or suspicious bone lesions identified. Thoracic spondylosis. CT ABDOMEN PELVIS FINDINGS Hepatobiliary: No suspicious hepatic lesion. Gallbladder is unremarkable. No biliary ductal dilation. Pancreas: No pancreatic ductal dilation or evidence of acute inflammation. Spleen: No splenomegaly or focal splenic lesion. Adrenals/Urinary Tract: Bilateral adrenal glands appear normal. No hydronephrosis. No suspicious renal mass. Urinary bladder is unremarkable for degree of degree of distension. Stomach/Bowel: Radiopaque enteric contrast material traverses distal loops of small bowel. Stomach is distended with ingested material, contrast and gas with some asymmetric wall thickening extending from the distal esophageal mass along the cardia/fundus along the lesser curvature of the stomach. No pathologic dilation of small or large bowel. Moderate volume of formed stool throughout the colon suggestive of constipation. Vascular/Lymphatic: Gastrohepatic and retroperitoneal adenopathy to the level of the renal veins.  For reference: -gastrohepatic lymph node measures 13 mm in short axis on image 52/2 -lobular left periaortic lymph node at the level of the left renal hilum measures 17 mm in short axis on image 66/2. No pathologically enlarged pelvic lymph nodes. Aortic atherosclerosis without aneurysmal dilation. Reproductive:  Status post hysterectomy. No adnexal masses. Other: No significant abdominopelvic free fluid. No discrete peritoneal or omental nodularity. Musculoskeletal: No aggressive lytic or blastic lesion of bone. IMPRESSION: 1. Asymmetric distal esophageal wall thickening, consistent with patient's known esophageal neoplasm. With the thickening appearing to extend along the lesser curvature of the stomach through the gastric cardia and proximal fundus with at least a portion of the thickening related to underdistention of the stomach. 2. Paratracheal/paraesophageal, gastrohepatic and retroperitoneal adenopathy, is most consistent with nodal disease involvement. 3. No evidence of solid organ or osseous metastatic disease in the chest, abdomen or pelvis. 4.  Aortic Atherosclerosis (ICD10-I70.0). Electronically Signed   By: Dahlia Bailiff M.D.   On: 03/17/2022 17:02     Assessment and plan- Patient is a 61 y.o. female who is being followed by Dr. Janese Banks for neuroendocrine esophageal cancer.  She is status post 1 cycle of cisplatin and etoposide and tolerated fair.  Neuroendocrine esophageal carcinoma-status post 1 cycle of cisplatin and etoposide.  Tecentriq was not approved by her [facility].  Plan is for 4 cycles given every 3 weeks and possible surgery at [facility].  Reviewed labs with patient and husband.  Noted some mild hyponatremia and hypokalemia.  Plan to give 1 L of normal saline while in clinic today.  Discussed dietary replacement for potassium plus daily supplements.   Fatigue/weakness-multifactorial.  Hemoglobin remains low although improved from previous.  This is likely contributing along with recent chemo.  She will receive additional IV iron on Friday.   Tinnitus-mainly left ear.  Had some tinnitus prior to initiating chemo although cisplatin can also cause/worsen this symptom.  Appears to be intermittent and improving since receiving chemo.  Continue to monitor.  Hypokalemia-potassium 3.3.   Discussed potassium rich foods.  Sent prescription for 20 mEq potassium twice daily x7 days.  Hyponatremia-sodium level 131.  Proceed with IV fluids per above.  Discussed adequate hydration and pushing fluids with water and Gatorade.  Nausea-continue antiemetics as prescribed by Dr. Janese Banks.  Patient did take her dexamethasone on day 4.  Takes Ativan as needed.  Disposition- Return to clinic as scheduled on Friday for lab work, see Dr. Janese Banks and IV iron.   Visit Diagnosis 1. Primary malignant neuroendocrine neoplasm of esophagus ([place])   2. Iron deficiency anemia due to chronic blood loss     Patient expressed understanding and was in agreement with this plan. She also understands that She can call clinic at any time with any questions, concerns, or complaints.   I spent 40 minutes dedicated to the care of this patient (face-to-face and non-face-to-face) on the date of the encounter to include what is described in the assessment and plan.  Thank you for allowing me to participate in the care of this very pleasant patient.    Jacquelin Hawking, NP [place] [facility] Cell - [phone] Pager- [phone] 04/04/2022 12:52 PM

## 2022-04-05 ENCOUNTER — Encounter: Payer: Self-pay | Admitting: Oncology

## 2022-04-08 ENCOUNTER — Inpatient Hospital Stay (HOSPITAL_BASED_OUTPATIENT_CLINIC_OR_DEPARTMENT_OTHER): Payer: BC Managed Care – PPO | Admitting: Oncology

## 2022-04-08 ENCOUNTER — Encounter: Payer: Self-pay | Admitting: Oncology

## 2022-04-08 ENCOUNTER — Ambulatory Visit: Payer: BC Managed Care – PPO | Admitting: Oncology

## 2022-04-08 ENCOUNTER — Inpatient Hospital Stay: Payer: BC Managed Care – PPO

## 2022-04-08 ENCOUNTER — Ambulatory Visit: Payer: BC Managed Care – PPO

## 2022-04-08 ENCOUNTER — Other Ambulatory Visit: Payer: BC Managed Care – PPO

## 2022-04-08 VITALS — BP 105/52 | HR 72 | Temp 98.5°F | Resp 18 | Wt 153.7 lb

## 2022-04-08 DIAGNOSIS — C7A8 Other malignant neuroendocrine tumors: Secondary | ICD-10-CM | POA: Diagnosis not present

## 2022-04-08 DIAGNOSIS — D509 Iron deficiency anemia, unspecified: Secondary | ICD-10-CM

## 2022-04-08 DIAGNOSIS — D649 Anemia, unspecified: Secondary | ICD-10-CM

## 2022-04-08 DIAGNOSIS — Z5111 Encounter for antineoplastic chemotherapy: Secondary | ICD-10-CM | POA: Diagnosis not present

## 2022-04-08 DIAGNOSIS — D508 Other iron deficiency anemias: Secondary | ICD-10-CM

## 2022-04-08 LAB — CBC WITH DIFFERENTIAL/PLATELET
Abs Immature Granulocytes: 0 10*3/uL (ref 0.00–0.07)
Basophils Absolute: 0 10*3/uL (ref 0.0–0.1)
Basophils Relative: 1 %
Eosinophils Absolute: 0.1 10*3/uL (ref 0.0–0.5)
Eosinophils Relative: 4 %
HCT: 22.7 % — ABNORMAL LOW (ref 36.0–46.0)
Hemoglobin: 7.3 g/dL — ABNORMAL LOW (ref 12.0–15.0)
Immature Granulocytes: 0 %
Lymphocytes Relative: 52 %
Lymphs Abs: 1.4 10*3/uL (ref 0.7–4.0)
MCH: 28.2 pg (ref 26.0–34.0)
MCHC: 32.2 g/dL (ref 30.0–36.0)
MCV: 87.6 fL (ref 80.0–100.0)
Monocytes Absolute: 0.1 10*3/uL (ref 0.1–1.0)
Monocytes Relative: 2 %
Neutro Abs: 1.1 10*3/uL — ABNORMAL LOW (ref 1.7–7.7)
Neutrophils Relative %: 41 %
Platelets: 143 10*3/uL — ABNORMAL LOW (ref 150–400)
RBC: 2.59 MIL/uL — ABNORMAL LOW (ref 3.87–5.11)
RDW: 13.4 % (ref 11.5–15.5)
Smear Review: NORMAL
WBC: 2.8 10*3/uL — ABNORMAL LOW (ref 4.0–10.5)
nRBC: 0 % (ref 0.0–0.2)

## 2022-04-08 LAB — VITAMIN B12: Vitamin B-12: 1283 pg/mL — ABNORMAL HIGH (ref 180–914)

## 2022-04-08 LAB — BASIC METABOLIC PANEL
Anion gap: 5 (ref 5–15)
BUN: 11 mg/dL (ref 8–23)
CO2: 25 mmol/L (ref 22–32)
Calcium: 8.6 mg/dL — ABNORMAL LOW (ref 8.9–10.3)
Chloride: 105 mmol/L (ref 98–111)
Creatinine, Ser: 0.7 mg/dL (ref 0.44–1.00)
GFR, Estimated: >60 mL/min (ref >60)
Glucose, Bld: 98 mg/dL (ref 70–99)
Potassium: 4.5 mmol/L (ref 3.5–5.1)
Sodium: 135 mmol/L (ref 135–145)

## 2022-04-08 LAB — FOLATE: Folate: 12 ng/mL (ref >5.9)

## 2022-04-08 MED ORDER — HEPARIN SOD (PORK) LOCK FLUSH 100 UNIT/ML IV SOLN
500.0000 [IU] | Freq: Once | INTRAVENOUS | Status: AC | PRN
  Administered 2022-04-08: 500 [IU]
  Filled 2022-04-08: qty 5

## 2022-04-08 MED ORDER — SODIUM CHLORIDE 0.9 % IV SOLN
200.0000 mg | INTRAVENOUS | Status: DC
  Administered 2022-04-08: 200 mg via INTRAVENOUS
  Filled 2022-04-08: qty 200

## 2022-04-08 MED ORDER — SODIUM CHLORIDE 0.9 % IV SOLN
Freq: Once | INTRAVENOUS | Status: AC
  Filled 2022-04-08: qty 250

## 2022-04-08 MED ORDER — SODIUM CHLORIDE 0.9 % IV SOLN
Freq: Once | INTRAVENOUS | Status: DC
  Filled 2022-04-08: qty 250

## 2022-04-08 NOTE — Progress Notes (Signed)
Pt states she is having trouble sleeping; as well as has developed some mouth sores. States that she believes potassium is making her feel better. Using BIOTIN mouthwash; but will like to buy the toothpaste as well.

## 2022-04-08 NOTE — Progress Notes (Signed)
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  Patient receiving neoadjuvant chemotherapy. No radiation.   Met with patient and husband during infusion.  Patient reports that she has been able to swallow more foods cheeseburger and fries, eggs, bacon, biscuit, peanut butter crackers, potato and broccoli soup, green beans.  Oral nutrition supplements have not been tasting good since starting treatment.  Patient with some mouth sores but not effecting intake.  Has had some nausea (1 episode day after treatment).    Medications: magic mouthwash being prescribed today for mouthsores  Labs: reviewed  Anthropometrics:   Weight 153 lb 11.2 oz  156 lb 10.2 oz on 7/19 167 lb on 06/29/21   NUTRITION DIAGNOSIS: Inadequate oral intake stable   INTERVENTION:  Pleased that patient's swallowing is better and able to eat more variety of foods Encouraged good sources of protein Suggested making homemade milkshake with ice cream, whole milk and fruit as unable to tolerate oral nutrition supplements    MONITORING, EVALUATION, GOAL: weight trends, intake   NEXT VISIT: Friday, August 11th during infusion  Cyd Hostler B. Zenia Resides, [place], [place] Registered Dietitian [phone]

## 2022-04-08 NOTE — Progress Notes (Signed)
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 Haig Prophet on 03/01/2022 EGD showed a medium-sized ulcerating mass with no bleeding or stigmata of recent bleeding at the GE junction..  Mass was partially obstructing.  There is also mention of another ulcerated noncircumferential mass with oozing bleeding found at the GE junction with extension into the cardia.  Both these masses were biopsied.  Pathology was pending at the time of my visit with the patient and was reported to be a poorly differentiated carcinoma.  Patient is currently able to swallow soft foods without much difficulty.  However more solid foods occasionally feel stuck.    PET was denied by insurance.  CT chest abdomen and pelvis with contrast showed asymmetric distal esophageal wall thickening with thickening extending around the lesser curvature of the stomach through the gastric cardia and proximal fundus.  Paratracheal/paraesophageal, gastrohepatic and PET retroperitoneal adenopathy.  Left periaortic lymph node measuring 17 mm at the level of left renal hilum.   Final pathology showed there were 2 specimens collected.  Specimen a was poorly differentiated carcinoma with ulceration and necrosis.v verys weakly positive for synaptophysin 50% of the cells.  Cells negative for CK7 and CK23 for the BF1 CDX2 100 Melan-A HMB45 and CD20.  Specimen B    Showed  large cell neuroendocrine carcinoma with neoplastic cells positive for CD56 with diffuse strong staining and majority of the neoplastic cells were also positive for synaptophysin with weak staining Ki-67 95%  Interval history-patient reports feeling better after receiving 1 cycle of chemotherapy.  States that she is able to eat better and in fact yesterday she ate fries bacon and biscuit which she was not able to do before.  She still has some ongoing fatigue.  Occasional nausea which is self-limited.  ECOG PS- 1 Pain scale- 0   Review of systems- Review of Systems  Constitutional:  Positive for malaise/fatigue. Negative for chills, fever and weight loss.  HENT:  Negative for congestion, ear discharge and nosebleeds.   Eyes:  Negative for blurred vision.  Respiratory:  Negative for cough, hemoptysis, sputum production, shortness of breath and wheezing.   Cardiovascular:  Negative for chest pain, palpitations, orthopnea and claudication.  Gastrointestinal:  Negative for abdominal pain, blood in stool, constipation, diarrhea, heartburn, melena, nausea and vomiting.  Genitourinary:  Negative for dysuria, flank pain, frequency, hematuria and urgency.  Musculoskeletal:  Negative for back pain, joint pain and myalgias.  Skin:  Negative for rash.  Neurological:  Negative for dizziness, tingling, focal weakness, seizures, weakness and headaches.  Endo/Heme/Allergies:  Does not bruise/bleed easily.  Psychiatric/Behavioral:  Negative for depression and suicidal ideas. The patient does not have insomnia.       Allergies  Allergen Reactions   Sulfa Antibiotics Hives    Says her tongue swelled a bit   Sulfonamide Derivatives Swelling    Tongue swelling     Past Medical History:  Diagnosis Date   Anemia    Arthritis  Atrial fibrillation ([place])    B12 deficiency    Dysrhythmia    Hyperlipidemia    Hypothyroidism    Menopause    Palpitations    Hx of, 25 years ago   Vertigo       Past Surgical History:  Procedure Laterality Date   BILATERAL SALPINGOOPHORECTOMY  2008   DILATION AND CURETTAGE OF UTERUS  08/24/2006   ESOPHAGOGASTRODUODENOSCOPY (EGD) WITH PROPOFOL N/A 03/01/2022   Procedure: ESOPHAGOGASTRODUODENOSCOPY (EGD) WITH PROPOFOL;  Surgeon: Lesly Rubenstein, MD;  Location: [facility] ENDOSCOPY;  Service: Endoscopy;  Laterality: N/A;   incision tendon sheath for trigger finger Right    ring finger   LAPAROSCOPIC SUPRACERVICAL HYSTERECTOMY  2008   Dr. Ammie Dalton; with BSO   NOSE SURGERY  03/2008   PORTA CATH INSERTION N/A 03/21/2022   Procedure: PORTA CATH INSERTION;  Surgeon: Algernon Huxley, MD;  Location: [facility];  Service: Cardiovascular;  Laterality: N/A;    Social History   Socioeconomic History   Marital status: Married    Spouse name: Not on file   Number of children: Not on file   Years of education: Not on file   Highest education level: Not on file  Occupational History   Occupation: Full time  Tobacco Use   Smoking status: Never   Smokeless tobacco: Never  Vaping Use   Vaping Use: Never used  Substance and Sexual Activity   Alcohol use: Never   Drug use: Never   Sexual activity: Not Currently    Birth control/protection: Surgical    Comment: Hysterectomy  Other Topics Concern   Not on file  Social History Narrative   Married with 2 children   Gets regular exercise   Social Determinants of Health   Financial Resource Strain: Not on file  Food Insecurity: Not on file  Transportation Needs: Not on file  Physical Activity: Not on file  Stress: Not on file  Social Connections: Not on file  Intimate Partner Violence: Not on file    Family History  Problem Relation Age of Onset   Hyperlipidemia Mother    Hypertension Mother    Stroke Mother    Heart disease Father    Hyperlipidemia Father    Atrial fibrillation Father    Melanoma Father    Arrhythmia Sister        Atrial fibrillation   Heart disease Sister     Atrial fibrillation Sister    Heart attack Brother    Breast cancer Neg Hx      Current Outpatient Medications:    amoxicillin-clavulanate (AUGMENTIN) 875-125 MG tablet, Take 1 tablet by mouth 2 (two) times daily., Disp: 14 tablet, Rfl: 0   aspirin 325 MG tablet, Take 325 mg by mouth once., Disp: , Rfl:    Calcium Carbonate-Vitamin D 600-200 MG-UNIT TABS, Take 1 tablet by mouth daily.  , Disp: , Rfl:    cetirizine (ZYRTEC) 10 MG tablet, Take 10 mg by mouth daily., Disp: , Rfl:    Cholecalciferol 25 MCG (1000 UT) tablet, Take by mouth., Disp: , Rfl:    dexamethasone (DECADRON) 4 MG tablet, Pt has 3 day chemo regimen and will need to take 2 tablets ( 69m) on day 4 for each cycle of chemo. Make sure to eat food in the am and then take the med, Disp: 30 tablet, Rfl: 1   DHEA 25 MG tablet, Take 25 mg by mouth daily., Disp: , Rfl:    diclofenac Sodium (VOLTAREN) 1 %  GEL, Apply 2 g topically 4 (four) times daily., Disp: , Rfl:    diltiazem (TIAZAC) 180 MG 24 hr capsule, Take 180 mg by mouth daily., Disp: , Rfl:    flecainide (TAMBOCOR) 50 MG tablet, Take 1 tablet by mouth 2 (two) times daily., Disp: , Rfl:    LORazepam (ATIVAN) 0.5 MG tablet, Take 1 tablet (0.5 mg total) by mouth every 6 (six) hours as needed (Nausea or vomiting)., Disp: 30 tablet, Rfl: 0   mometasone (NASONEX) 50 MCG/ACT nasal spray, Place 2 sprays into the nose daily., Disp: , Rfl:    pantoprazole (PROTONIX) 40 MG tablet, Take 40 mg by mouth daily., Disp: , Rfl:    potassium chloride SA (KLOR-CON M) 20 MEQ tablet, Take 1 tablet (20 mEq total) by mouth 2 (two) times daily., Disp: 14 tablet, Rfl: 0   Saccharomyces boulardii (PROBIOTIC) 250 MG CAPS, Take by mouth., Disp: , Rfl:    simvastatin (ZOCOR) 20 MG tablet, TAKE 1 TABLET BY MOUTH EVERY EVENING., Disp: 30 tablet, Rfl: 0   vitamin B-12 (CYANOCOBALAMIN) 1000 MCG tablet, Take 1,000 mcg by mouth daily., Disp: , Rfl:    Zinc Citrate-Phytase (ZYTAZE) 25-500 MG CAPS, Take 1 Dose by  mouth daily., Disp: , Rfl:    estradiol (ESTRACE) 0.1 MG/GM vaginal cream, Place 1 Applicatorful vaginally at bedtime. (Patient not taking: Reported on 03/04/2022), Disp: , Rfl:    L-Lysine 1000 MG TABS, Take 1,000 mg by mouth daily. (Patient not taking: Reported on 03/29/2022), Disp: , Rfl:    lidocaine-prilocaine (EMLA) cream, Apply to affected area once, Disp: 30 g, Rfl: 3   meclizine (ANTIVERT) 25 MG tablet, Take 1 tablet (25 mg total) by mouth 3 (three) times daily as needed for dizziness. (Patient not taking: Reported on 03/04/2022), Disp: 30 tablet, Rfl: 0   metoprolol tartrate (LOPRESSOR) 25 MG tablet, TAKE 1 TABLET (25 MG TOTAL) BY MOUTH 2 (TWO) TIMES DAILY AS NEEDED., Disp: 60 tablet, Rfl: 5   Omega-3 Fatty Acids (FISH OIL) 1200 MG CAPS, Take by mouth daily.  (Patient not taking: Reported on 03/21/2022), Disp: , Rfl:    omeprazole (PRILOSEC) 20 MG capsule, , Disp: , Rfl:    ondansetron (ZOFRAN) 8 MG tablet, Take 1 tablet (8 mg total) by mouth 2 (two) times daily as needed. Start on the third day after cisplatin chemotherapy. (Patient not taking: Reported on 03/29/2022), Disp: 30 tablet, Rfl: 1   prochlorperazine (COMPAZINE) 10 MG tablet, Take 1 tablet (10 mg total) by mouth every 6 (six) hours as needed (Nausea or vomiting). (Patient not taking: Reported on 03/29/2022), Disp: 30 tablet, Rfl: 1   traZODone (DESYREL) 50 MG tablet, Take 50 mg by mouth as needed.  (Patient not taking: Reported on 03/29/2022), Disp: , Rfl:  No current facility-administered medications for this visit.  Facility-Administered Medications Ordered in Other Visits:    0.9 %  sodium chloride infusion, , Intravenous, Once, Sindy Guadeloupe, MD   iron sucrose (VENOFER) 200 mg in sodium chloride 0.9 % 100 mL IVPB, 200 mg, Intravenous, Weekly, Sindy Guadeloupe, MD, Stopped at 04/08/22 1153  Physical exam:  Vitals:   04/08/22 0945  BP: (!) 105/52  Pulse: 72  Resp: 18  Temp: 98.5 F (36.9 C)  SpO2: 100%  Weight: 153 lb 11.2  oz (69.7 kg)   Physical Exam Constitutional:      General: She is not in acute distress. Cardiovascular:     Rate and Rhythm: Normal rate and regular rhythm.  Heart sounds: Normal heart sounds.  Pulmonary:     Effort: Pulmonary effort is normal.     Breath sounds: Normal breath sounds.  Skin:    General: Skin is warm and dry.  Neurological:     Mental Status: She is alert and oriented to person, place, and time.         Latest Ref Rng & Units 04/08/2022    9:11 AM  CMP  Glucose 70 - 99 mg/dL 98   BUN 8 - 23 mg/dL 11   Creatinine 0.44 - 1.00 mg/dL 0.70   Sodium 135 - 145 mmol/L 135   Potassium 3.5 - 5.1 mmol/L 4.5   Chloride 98 - 111 mmol/L 105   CO2 22 - 32 mmol/L 25   Calcium 8.9 - 10.3 mg/dL 8.6       Latest Ref Rng & Units 04/08/2022    9:11 AM  CBC  WBC 4.0 - 10.5 K/uL 2.8   Hemoglobin 12.0 - 15.0 g/dL 7.3   Hematocrit 36.0 - 46.0 % 22.7   Platelets 150 - 400 K/uL 143     No images are attached to the encounter.  MR Brain W Wo Contrast  Result Date: 04/03/2022 CLINICAL DATA:  Esophageal cancer, intracranial staging EXAM: MRI HEAD WITHOUT AND WITH CONTRAST TECHNIQUE: Multiplanar, multiecho pulse sequences of the brain and surrounding structures were obtained without and with intravenous contrast. CONTRAST:  7.71m GADAVIST GADOBUTROL 1 MMOL/ML IV SOLN COMPARISON:  03/22/2022 PET-CT FINDINGS: Brain: No enhancement or swelling to suggest metastatic disease. Irregularly-shaped area of T2 hyperintensity in the right cerebral white matter with underlying developmental venous anomaly. No acute hemorrhage or swelling superimposed. No acute infarct, hydrocephalus, or collection. Vascular: Major flow voids and vascular enhancements are preserved Skull and upper cervical spine: Negative for marrow lesion. Sinuses/Orbits: Negative IMPRESSION: 1. Negative for metastatic disease. 2. 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 Electronically Signed   By: JJorje GuildM.D.   On: 04/03/2022 08:24   NM PET Image Initial (PI) Skull Base To Thigh  Result Date: 03/22/2022 CLINICAL DATA:  Initial treatment strategy for esophageal carcinoma. EXAM: NUCLEAR MEDICINE PET SKULL BASE TO THIGH TECHNIQUE: 8.6 mCi F-18 FDG was injected intravenously. Full-ring PET imaging was performed from the skull base to thigh after the radiotracer. CT data was obtained and used for attenuation correction and anatomic localization. Fasting blood glucose: 109 mg/dl COMPARISON:  CT on 03/17/2022 FINDINGS: Mediastinal blood-pool activity (background): SUV max = 2.2 Liver activity (reference): SUV max = N/A NECK: 1.1 cm left level 4 lower jugular lymph node on image 516/1is hypermetabolic, with SUV max of 5.3. 1.1 cm right supraclavicular lymph node on image 609/6is hypermetabolic, with SUV max of 5.1. Incidental CT findings:  None. CHEST: Concentric hypermetabolic wall thickening is seen involving the distal thoracic esophagus and proximal stomach,, with SUV max of 11.0, consistent with known esophageal carcinoma. A 1 cm left paraesophageal lymph node is seen on image 111/2, which is hypermetabolic with SUV max of 3.9. No other hypermetabolic lymphadenopathy seen within the thorax. No suspicious pulmonary nodules seen on CT images. Incidental CT findings:  None. ABDOMEN/PELVIS: No abnormal hypermetabolic activity within the liver, pancreas, adrenal glands, or spleen. Mild hypermetabolic lymphadenopathy seen in the gastrohepatic ligament, which shows SUV max of 6.8. 11 mm lymph node in porta hepatis is also hypermetabolic, with SUV max of 5.9. 1.8 cm left paraaortic lymph node  is hypermetabolic, with SUV max of 7.9. No hypermetabolic lymph nodes seen within the pelvis. Incidental CT findings:  None. SKELETON: No focal hypermetabolic bone lesions to suggest skeletal metastasis. Incidental CT findings:  None.  IMPRESSION: Hypermetabolic wall thickening involving the distal thoracic esophagus and proximal stomach, consistent with known esophageal carcinoma. Hypermetabolic lymphadenopathy in the neck, chest, and abdomen as described above, consistent with metastatic disease. Electronically Signed   By: Marlaine Hind M.D.   On: 03/22/2022 13:50   PERIPHERAL VASCULAR CATHETERIZATION  Result Date: 03/21/2022 See surgical note for result.  CT CHEST ABDOMEN PELVIS W CONTRAST  Result Date: 03/17/2022 CLINICAL DATA:  Esophageal cancer, staging. EXAM: CT CHEST, ABDOMEN, AND PELVIS WITH CONTRAST TECHNIQUE: Multidetector CT imaging of the chest, abdomen and pelvis was performed following the standard protocol during bolus administration of intravenous contrast. RADIATION DOSE REDUCTION: This exam was performed according to the departmental dose-optimization program which includes automated exposure control, adjustment of the mA and/or kV according to patient size and/or use of iterative reconstruction technique. CONTRAST:  181m OMNIPAQUE IOHEXOL 300 MG/ML  SOLN COMPARISON:  CT November 12, 2010 FINDINGS: CT CHEST FINDINGS Cardiovascular: Aortic atherosclerosis without thoracic aortic aneurysm. No central pulmonary embolus on this nondedicated study. Coronary artery calcifications. No significant pericardial effusion/thickening. Normal size heart. Mediastinum/Nodes: No suspicious thyroid nodule. Prominent/enlarged paraesophageal and paratracheal mediastinal lymph nodes. For reference: -lobular left upper paratracheal lymph node near the thoracic inlet measures 11 mm in short axis on image 9/2. -left lower paraesophageal lymph node near the hiatus measures 11 mm in short axis on image 42/2. Asymmetric thickening of the distal esophagus measures proximally 5.0 cm in length on coronal image 81/4 and 3.2 x 2.4 cm in maximum axial dimension on image 44/2 with the asymmetric thickening extending along the lesser curvature of the stomach  for instance on axial image 51/2. No pathologically enlarged hilar or axillary lymph nodes. Lungs/Pleura: No suspicious pulmonary nodules or masses. No focal airspace consolidation. No pleural effusion. No pneumothorax. Musculoskeletal: No chest wall mass or suspicious bone lesions identified. Thoracic spondylosis. CT ABDOMEN PELVIS FINDINGS Hepatobiliary: No suspicious hepatic lesion. Gallbladder is unremarkable. No biliary ductal dilation. Pancreas: No pancreatic ductal dilation or evidence of acute inflammation. Spleen: No splenomegaly or focal splenic lesion. Adrenals/Urinary Tract: Bilateral adrenal glands appear normal. No hydronephrosis. No suspicious renal mass. Urinary bladder is unremarkable for degree of degree of distension. Stomach/Bowel: Radiopaque enteric contrast material traverses distal loops of small bowel. Stomach is distended with ingested material, contrast and gas with some asymmetric wall thickening extending from the distal esophageal mass along the cardia/fundus along the lesser curvature of the stomach. No pathologic dilation of small or large bowel. Moderate volume of formed stool throughout the colon suggestive of constipation. Vascular/Lymphatic: Gastrohepatic and retroperitoneal adenopathy to the level of the renal veins. For reference: -gastrohepatic lymph node measures 13 mm in short axis on image 52/2 -lobular left periaortic lymph node at the level of the left renal hilum measures 17 mm in short axis on image 66/2. No pathologically enlarged pelvic lymph nodes. Aortic atherosclerosis without aneurysmal dilation. Reproductive: Status post hysterectomy. No adnexal masses. Other: No significant abdominopelvic free fluid. No discrete peritoneal or omental nodularity. Musculoskeletal: No aggressive lytic or blastic lesion of bone. IMPRESSION: 1. Asymmetric distal esophageal wall thickening, consistent with patient's known esophageal neoplasm. With the thickening appearing to extend  along the lesser curvature of the stomach through the gastric cardia and proximal fundus with at least a portion of the thickening related  to underdistention of the stomach. 2. Paratracheal/paraesophageal, gastrohepatic and retroperitoneal adenopathy, is most consistent with nodal disease involvement. 3. No evidence of solid organ or osseous metastatic disease in the chest, abdomen or pelvis. 4.  Aortic Atherosclerosis (ICD10-I70.0). Electronically Signed   By: Dahlia Bailiff M.D.   On: 03/17/2022 17:02     Assessment and plan- Patient is a 61 y.o. 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  She is here for toxicity check after cycle 1 of cisplatin etoposide chemotherapy  Second opinion pathology consult at [facility] was signed out as poorly differentiated adenocarcinoma rather than a large cell neuroendocrine tumor.  I have discussed these findings with Dr. Estelle Grumbles from [facility] as well.  Plan is to proceed with 2 cycles of cisplatin etoposide chemotherapy at this time followed by repeat scans.  If she is continuing to respond we will continue with cisplatin etoposide regimen.  If she does not then we will consider switching her to FOLFOX or CarboTaxol regimen.  Tecentriq was recommended by [facility] as well but was not approved by her insurance.  We are trying to get this from the drug company directly especially for cycle 2.  Anemia: Likely secondary to malignancy and underlying iron deficiency.  She did receive 1 unit of blood transfusion with cycle 1 of chemotherapy.  Hemoglobin is 7.3.  Overall patient is feeling better.  I am holding off on further blood transfusion at this time.  She will get total 5 doses of Venofer with the second dose being today.  She will keep future follow-up appointments to see covering MD/NP for cycle 2 of treatment and I will see her back for cycle 3 of treatment with scans prior.  1 L of IV fluids today and we will also schedule her for tentative  IV fluids after cycle 2 of chemotherapy.  CA 19-9 to be checked with the next set of labs.  Baseline CEA was normal    Visit Diagnosis 1. Primary malignant neuroendocrine neoplasm of esophagus ([place])   2. Iron deficiency anemia, unspecified iron deficiency anemia type      Dr. Randa Evens, MD, MPH [facility] at [facility] [phone] 04/08/2022 3:24 PM

## 2022-04-09 ENCOUNTER — Other Ambulatory Visit: Payer: Self-pay

## 2022-04-09 LAB — CANCER ANTIGEN 19-9: CA 19-9: 5 U/mL (ref 0–35)

## 2022-04-12 ENCOUNTER — Other Ambulatory Visit: Payer: Self-pay | Admitting: Oncology

## 2022-04-12 ENCOUNTER — Encounter: Payer: Self-pay | Admitting: Internal Medicine

## 2022-04-12 ENCOUNTER — Other Ambulatory Visit: Payer: Self-pay | Admitting: Internal Medicine

## 2022-04-12 ENCOUNTER — Other Ambulatory Visit: Payer: Self-pay

## 2022-04-12 MED ORDER — LEVOFLOXACIN 500 MG PO TABS: 500.0000 mg | ORAL_TABLET | Freq: Every day | ORAL | 0 refills | Status: DC

## 2022-04-12 NOTE — Progress Notes (Signed)
I returned a phone call given patient's concern for fever 100.3; chills, body aches joint pains worsening fatigue.  Recommend Levaquin/ called in.   Recommend further evaluation in symptom management tomorrow August 2.   GB

## 2022-04-12 NOTE — Progress Notes (Signed)
See my previous note on this patient.  Called in a prescription for Levaquin; please follow-up in the symptom management clinic tomorrow.  Thanks GB

## 2022-04-13 ENCOUNTER — Inpatient Hospital Stay
Admission: EM | Admit: 2022-04-13 | Discharge: 2022-04-17 | DRG: 810 | Disposition: A | Discharge status: Home / Self Care | Payer: BC Managed Care – PPO | Attending: Internal Medicine | Admitting: Internal Medicine

## 2022-04-13 ENCOUNTER — Encounter: Payer: Self-pay | Admitting: Emergency Medicine

## 2022-04-13 ENCOUNTER — Inpatient Hospital Stay (HOSPITAL_BASED_OUTPATIENT_CLINIC_OR_DEPARTMENT_OTHER): Payer: BC Managed Care – PPO | Admitting: Hospice and Palliative Medicine

## 2022-04-13 ENCOUNTER — Ambulatory Visit
Admission: RE | Admit: 2022-04-13 | Discharge: 2022-04-13 | Disposition: A | Discharge status: Home / Self Care | Payer: BC Managed Care – PPO | Source: Home / Self Care | Attending: Hospice and Palliative Medicine | Admitting: Hospice and Palliative Medicine

## 2022-04-13 ENCOUNTER — Encounter: Payer: Self-pay | Admitting: Hospice and Palliative Medicine

## 2022-04-13 ENCOUNTER — Ambulatory Visit
Admission: RE | Admit: 2022-04-13 | Discharge: 2022-04-13 | Disposition: A | Discharge status: Home / Self Care | Payer: BC Managed Care – PPO | Source: Ambulatory Visit | Attending: Hospice and Palliative Medicine | Admitting: Hospice and Palliative Medicine

## 2022-04-13 ENCOUNTER — Other Ambulatory Visit: Payer: Self-pay | Admitting: *Deleted

## 2022-04-13 ENCOUNTER — Inpatient Hospital Stay: Payer: BC Managed Care – PPO | Attending: Oncology

## 2022-04-13 ENCOUNTER — Other Ambulatory Visit: Payer: Self-pay

## 2022-04-13 ENCOUNTER — Inpatient Hospital Stay: Payer: BC Managed Care – PPO

## 2022-04-13 ENCOUNTER — Emergency Department: Payer: BC Managed Care – PPO

## 2022-04-13 ENCOUNTER — Telehealth: Payer: Self-pay | Admitting: *Deleted

## 2022-04-13 VITALS — Resp 18

## 2022-04-13 VITALS — BP 110/59 | HR 96 | Temp 100.4°F

## 2022-04-13 DIAGNOSIS — I4891 Unspecified atrial fibrillation: Secondary | ICD-10-CM | POA: Diagnosis present

## 2022-04-13 DIAGNOSIS — Z8249 Family history of ischemic heart disease and other diseases of the circulatory system: Secondary | ICD-10-CM

## 2022-04-13 DIAGNOSIS — R6883 Chills (without fever): Secondary | ICD-10-CM

## 2022-04-13 DIAGNOSIS — D709 Neutropenia, unspecified: Secondary | ICD-10-CM

## 2022-04-13 DIAGNOSIS — D6481 Anemia due to antineoplastic chemotherapy: Secondary | ICD-10-CM | POA: Diagnosis not present

## 2022-04-13 DIAGNOSIS — I959 Hypotension, unspecified: Secondary | ICD-10-CM | POA: Diagnosis present

## 2022-04-13 DIAGNOSIS — I10 Essential (primary) hypertension: Secondary | ICD-10-CM | POA: Diagnosis present

## 2022-04-13 DIAGNOSIS — Z79899 Other long term (current) drug therapy: Secondary | ICD-10-CM | POA: Insufficient documentation

## 2022-04-13 DIAGNOSIS — E785 Hyperlipidemia, unspecified: Secondary | ICD-10-CM | POA: Diagnosis present

## 2022-04-13 DIAGNOSIS — R5081 Fever presenting with conditions classified elsewhere: Secondary | ICD-10-CM | POA: Insufficient documentation

## 2022-04-13 DIAGNOSIS — D509 Iron deficiency anemia, unspecified: Secondary | ICD-10-CM | POA: Insufficient documentation

## 2022-04-13 DIAGNOSIS — E860 Dehydration: Secondary | ICD-10-CM

## 2022-04-13 DIAGNOSIS — D649 Anemia, unspecified: Secondary | ICD-10-CM | POA: Diagnosis present

## 2022-04-13 DIAGNOSIS — C159 Malignant neoplasm of esophagus, unspecified: Secondary | ICD-10-CM

## 2022-04-13 DIAGNOSIS — C7A8 Other malignant neuroendocrine tumors: Secondary | ICD-10-CM | POA: Insufficient documentation

## 2022-04-13 DIAGNOSIS — D61811 Other drug-induced pancytopenia: Secondary | ICD-10-CM | POA: Diagnosis present

## 2022-04-13 DIAGNOSIS — D3A8 Other benign neuroendocrine tumors: Secondary | ICD-10-CM | POA: Diagnosis present

## 2022-04-13 DIAGNOSIS — E538 Deficiency of other specified B group vitamins: Secondary | ICD-10-CM | POA: Diagnosis present

## 2022-04-13 DIAGNOSIS — Z882 Allergy status to sulfonamides status: Secondary | ICD-10-CM | POA: Diagnosis not present

## 2022-04-13 DIAGNOSIS — I480 Paroxysmal atrial fibrillation: Secondary | ICD-10-CM | POA: Diagnosis present

## 2022-04-13 DIAGNOSIS — Z20822 Contact with and (suspected) exposure to covid-19: Secondary | ICD-10-CM | POA: Diagnosis present

## 2022-04-13 DIAGNOSIS — Z83438 Family history of other disorder of lipoprotein metabolism and other lipidemia: Secondary | ICD-10-CM | POA: Diagnosis not present

## 2022-04-13 DIAGNOSIS — Z5112 Encounter for antineoplastic immunotherapy: Secondary | ICD-10-CM | POA: Insufficient documentation

## 2022-04-13 DIAGNOSIS — E039 Hypothyroidism, unspecified: Secondary | ICD-10-CM | POA: Diagnosis present

## 2022-04-13 DIAGNOSIS — Z90722 Acquired absence of ovaries, bilateral: Secondary | ICD-10-CM

## 2022-04-13 DIAGNOSIS — Z5111 Encounter for antineoplastic chemotherapy: Secondary | ICD-10-CM | POA: Diagnosis present

## 2022-04-13 DIAGNOSIS — Z95828 Presence of other vascular implants and grafts: Secondary | ICD-10-CM

## 2022-04-13 DIAGNOSIS — D508 Other iron deficiency anemias: Secondary | ICD-10-CM

## 2022-04-13 LAB — CBC WITH DIFFERENTIAL/PLATELET
Abs Immature Granulocytes: 0 10*3/uL (ref 0.00–0.07)
Abs Immature Granulocytes: 0 10*3/uL (ref 0.00–0.07)
Basophils Absolute: 0 10*3/uL (ref 0.0–0.1)
Basophils Absolute: 0 10*3/uL (ref 0.0–0.1)
Basophils Relative: 1 %
Basophils Relative: 0 %
Eosinophils Absolute: 0 10*3/uL (ref 0.0–0.5)
Eosinophils Absolute: 0 10*3/uL (ref 0.0–0.5)
Eosinophils Relative: 4 %
Eosinophils Relative: 3 %
HCT: 23.2 % — ABNORMAL LOW (ref 36.0–46.0)
HCT: 22.4 % — ABNORMAL LOW (ref 36.0–46.0)
Hemoglobin: 7.5 g/dL — ABNORMAL LOW (ref 12.0–15.0)
Hemoglobin: 7.1 g/dL — ABNORMAL LOW (ref 12.0–15.0)
Immature Granulocytes: 0 %
Immature Granulocytes: 0 %
Lymphocytes Relative: 80 %
Lymphocytes Relative: 78 %
Lymphs Abs: 0.9 10*3/uL (ref 0.7–4.0)
Lymphs Abs: 0.9 10*3/uL (ref 0.7–4.0)
MCH: 29.3 pg (ref 26.0–34.0)
MCH: 29 pg (ref 26.0–34.0)
MCHC: 32.3 g/dL (ref 30.0–36.0)
MCHC: 31.7 g/dL (ref 30.0–36.0)
MCV: 90.6 fL (ref 80.0–100.0)
MCV: 91.4 fL (ref 80.0–100.0)
Monocytes Absolute: 0.1 10*3/uL (ref 0.1–1.0)
Monocytes Absolute: 0.2 10*3/uL (ref 0.1–1.0)
Monocytes Relative: 11 %
Monocytes Relative: 14 %
Neutro Abs: 0 10*3/uL — CL (ref 1.7–7.7)
Neutro Abs: 0.1 10*3/uL — CL (ref 1.7–7.7)
Neutrophils Relative %: 4 %
Neutrophils Relative %: 5 %
Platelets: 191 10*3/uL (ref 150–400)
Platelets: 171 10*3/uL (ref 150–400)
RBC: 2.56 MIL/uL — ABNORMAL LOW (ref 3.87–5.11)
RBC: 2.45 MIL/uL — ABNORMAL LOW (ref 3.87–5.11)
RDW: 15.9 % — ABNORMAL HIGH (ref 11.5–15.5)
RDW: 15.9 % — ABNORMAL HIGH (ref 11.5–15.5)
Smear Review: NORMAL
Smear Review: NORMAL
WBC: 1.1 10*3/uL — CL (ref 4.0–10.5)
WBC: 1.1 10*3/uL — CL (ref 4.0–10.5)
nRBC: 0 % (ref 0.0–0.2)
nRBC: 0 % (ref 0.0–0.2)

## 2022-04-13 LAB — COMPREHENSIVE METABOLIC PANEL
ALT: 11 U/L (ref 0–44)
ALT: 10 U/L (ref 0–44)
AST: 11 U/L — ABNORMAL LOW (ref 15–41)
AST: 11 U/L — ABNORMAL LOW (ref 15–41)
Albumin: 3.5 g/dL (ref 3.5–5.0)
Albumin: 3.4 g/dL — ABNORMAL LOW (ref 3.5–5.0)
Alkaline Phosphatase: 64 U/L (ref 38–126)
Alkaline Phosphatase: 65 U/L (ref 38–126)
Anion gap: 7 (ref 5–15)
Anion gap: 6 (ref 5–15)
BUN: 13 mg/dL (ref 8–23)
BUN: 14 mg/dL (ref 8–23)
CO2: 24 mmol/L (ref 22–32)
CO2: 24 mmol/L (ref 22–32)
Calcium: 8.4 mg/dL — ABNORMAL LOW (ref 8.9–10.3)
Calcium: 8 mg/dL — ABNORMAL LOW (ref 8.9–10.3)
Chloride: 103 mmol/L (ref 98–111)
Chloride: 104 mmol/L (ref 98–111)
Creatinine, Ser: 0.83 mg/dL (ref 0.44–1.00)
Creatinine, Ser: 0.87 mg/dL (ref 0.44–1.00)
GFR, Estimated: >60 mL/min (ref >60)
GFR, Estimated: >60 mL/min (ref >60)
Glucose, Bld: 99 mg/dL (ref 70–99)
Glucose, Bld: 115 mg/dL — ABNORMAL HIGH (ref 70–99)
Potassium: 3.8 mmol/L (ref 3.5–5.1)
Potassium: 3.9 mmol/L (ref 3.5–5.1)
Sodium: 134 mmol/L — ABNORMAL LOW (ref 135–145)
Sodium: 134 mmol/L — ABNORMAL LOW (ref 135–145)
Total Bilirubin: 0.6 mg/dL (ref 0.3–1.2)
Total Bilirubin: 0.5 mg/dL (ref 0.3–1.2)
Total Protein: 6.7 g/dL (ref 6.5–8.1)
Total Protein: 6.4 g/dL — ABNORMAL LOW (ref 6.5–8.1)

## 2022-04-13 LAB — RESP PANEL BY RT-PCR (FLU A&B, COVID) ARPGX2
Influenza A by PCR: NEGATIVE
Influenza B by PCR: NEGATIVE
SARS Coronavirus 2 by RT PCR: NEGATIVE

## 2022-04-13 LAB — URINALYSIS, COMPLETE (UACMP) WITH MICROSCOPIC
Bacteria, UA: NONE SEEN
Bilirubin Urine: NEGATIVE
Bilirubin Urine: NEGATIVE
Glucose, UA: NEGATIVE mg/dL
Glucose, UA: NEGATIVE mg/dL
Hgb urine dipstick: NEGATIVE
Hgb urine dipstick: NEGATIVE
Ketones, ur: NEGATIVE mg/dL
Ketones, ur: NEGATIVE mg/dL
Leukocytes,Ua: NEGATIVE
Leukocytes,Ua: NEGATIVE
Nitrite: NEGATIVE
Nitrite: NEGATIVE
Protein, ur: NEGATIVE mg/dL
Protein, ur: NEGATIVE mg/dL
Specific Gravity, Urine: 1.017 (ref 1.005–1.030)
Specific Gravity, Urine: 1.005 (ref 1.005–1.030)
Squamous Epithelial / HPF: NONE SEEN (ref 0–5)
pH: 5 (ref 5.0–8.0)
pH: 7 (ref 5.0–8.0)

## 2022-04-13 LAB — LACTIC ACID, PLASMA
Lactic Acid, Venous: 0.7 mmol/L (ref 0.5–1.9)
Lactic Acid, Venous: 1 mmol/L (ref 0.5–1.9)

## 2022-04-13 LAB — SAMPLE TO BLOOD BANK

## 2022-04-13 LAB — MAGNESIUM: Magnesium: 2.3 mg/dL (ref 1.7–2.4)

## 2022-04-13 LAB — PROCALCITONIN: Procalcitonin: <0.1 ng/mL

## 2022-04-13 MED ORDER — VANCOMYCIN HCL 1250 MG/250ML IV SOLN
1250.0000 mg | INTRAVENOUS | Status: DC
Start: 2022-04-14 — End: 2022-04-17
  Administered 2022-04-14 – 2022-04-16 (×3): 1250 mg via INTRAVENOUS
  Filled 2022-04-13 (×3): qty 250

## 2022-04-13 MED ORDER — LEVOFLOXACIN 500 MG PO TABS: 500.0000 mg | ORAL_TABLET | Freq: Every day | ORAL | 0 refills | Status: DC

## 2022-04-13 MED ORDER — ASPIRIN 325 MG PO TABS: 325.0000 mg | ORAL_TABLET | Freq: Every day | ORAL | Status: DC

## 2022-04-13 MED ORDER — IOHEXOL 350 MG/ML SOLN
75.0000 mL | Freq: Once | INTRAVENOUS | Status: AC | PRN
  Administered 2022-04-13: 75 mL via INTRAVENOUS

## 2022-04-13 MED ORDER — SODIUM CHLORIDE 0.9 % IV BOLUS
500.0000 mL | Freq: Once | INTRAVENOUS | Status: AC
Start: 2022-04-13 — End: 2022-04-14
  Administered 2022-04-13: 500 mL via INTRAVENOUS

## 2022-04-13 MED ORDER — CEFEPIME HCL 2 G IV SOLR
2.0000 g | Freq: Three times a day (TID) | INTRAVENOUS | Status: DC
  Administered 2022-04-14 – 2022-04-17 (×10): 2 g via INTRAVENOUS
  Filled 2022-04-13 (×3): qty 12.5
  Filled 2022-04-13: qty 2
  Filled 2022-04-13: qty 12.5
  Filled 2022-04-13: qty 2
  Filled 2022-04-13 (×2): qty 12.5
  Filled 2022-04-13: qty 2
  Filled 2022-04-13 (×3): qty 12.5

## 2022-04-13 MED ORDER — DILTIAZEM HCL ER BEADS 180 MG PO CP24: 180.0000 mg | ORAL_CAPSULE | Freq: Every day | ORAL | Status: DC

## 2022-04-13 MED ORDER — SODIUM CHLORIDE 0.9 % IV BOLUS (SEPSIS)
1000.0000 mL | Freq: Once | INTRAVENOUS | Status: AC
  Administered 2022-04-13: 1000 mL via INTRAVENOUS

## 2022-04-13 MED ORDER — SODIUM CHLORIDE 0.9 % IV SOLN
2.0000 g | Freq: Once | INTRAVENOUS | Status: AC
  Administered 2022-04-13: 2 g via INTRAVENOUS
  Filled 2022-04-13: qty 12.5

## 2022-04-13 MED ORDER — HEPARIN SOD (PORK) LOCK FLUSH 100 UNIT/ML IV SOLN
500.0000 [IU] | Freq: Once | INTRAVENOUS | Status: AC
  Administered 2022-04-13: 500 [IU] via INTRAVENOUS
  Filled 2022-04-13: qty 5

## 2022-04-13 MED ORDER — VANCOMYCIN HCL 1500 MG/300ML IV SOLN
1500.0000 mg | Freq: Once | INTRAVENOUS | Status: AC
  Administered 2022-04-13: 1500 mg via INTRAVENOUS
  Filled 2022-04-13: qty 300

## 2022-04-13 MED ORDER — DEXAMETHASONE 0.5 MG PO TABS
1.0000 mg | ORAL_TABLET | Freq: Every day | ORAL | Status: DC
  Administered 2022-04-14 – 2022-04-17 (×4): 1 mg via ORAL
  Filled 2022-04-13 (×4): qty 2

## 2022-04-13 MED ORDER — PANTOPRAZOLE SODIUM 40 MG IV SOLR
40.0000 mg | Freq: Two times a day (BID) | INTRAVENOUS | Status: DC
  Administered 2022-04-13 – 2022-04-15 (×4): 40 mg via INTRAVENOUS
  Filled 2022-04-13 (×4): qty 10

## 2022-04-13 MED ORDER — SODIUM CHLORIDE 0.9 % IV SOLN: 2.0000 g | Freq: Once | INTRAVENOUS | Status: DC

## 2022-04-13 MED ORDER — LACTATED RINGERS IV SOLN: INTRAVENOUS | Status: AC

## 2022-04-13 MED ORDER — IBUPROFEN 600 MG PO TABS
600.0000 mg | ORAL_TABLET | Freq: Once | ORAL | Status: AC
  Administered 2022-04-13: 600 mg via ORAL
  Filled 2022-04-13: qty 1

## 2022-04-13 MED ORDER — SODIUM CHLORIDE 0.9 % IV SOLN
Freq: Once | INTRAVENOUS | Status: AC
  Filled 2022-04-13: qty 250

## 2022-04-13 MED ORDER — SODIUM CHLORIDE 0.9% FLUSH
10.0000 mL | Freq: Once | INTRAVENOUS | Status: AC
  Administered 2022-04-13: 10 mL via INTRAVENOUS
  Filled 2022-04-13: qty 10

## 2022-04-13 MED ORDER — ACETAMINOPHEN 500 MG PO TABS
1000.0000 mg | ORAL_TABLET | Freq: Once | ORAL | Status: AC
  Administered 2022-04-13: 1000 mg via ORAL
  Filled 2022-04-13: qty 2

## 2022-04-13 NOTE — ED Provider Notes (Signed)
[facility] Provider Note    Event Date/Time   First MD Initiated Contact with Patient 04/13/22 2034     (approximate)  History   Chief Complaint: Fever  HPI  RYLEE HUESTIS is a 61 y.o. female with a past medical history of neuroendocrine gastric tumor currently on chemotherapy presents to the emergency department for a fever and generalized weakness.  According to the patient since yesterday she has had a low-grade fever at home.  She called her oncologist and saw them this morning had an x-ray that was negative per review, was given IV Levaquin per Dr. Rogue Bussing and discharged home.  After going home patient then spiked a fever to 102 with chills and weakness and was referred to the emergency department.  Here the patient continues to be febrile to 102, pulse rate of 100 and borderline low BP of 102/59.  Patient denies any cough or congestion denies any nausea vomiting or diarrhea no abdominal pain or chest pain.  Physical Exam   Triage Vital Signs: ED Triage Vitals [04/13/22 1905]  Enc Vitals Group     BP (!) 102/59     Pulse Rate 100     Resp 20     Temp (!) 102 F (38.9 C)     Temp Source Oral     SpO2 99 %     Weight      Height      Head Circumference      Peak Flow      Pain Score      Pain Loc      Pain Edu?      Excl. in [place]?     Most recent vital signs: Vitals:   04/13/22 1905  BP: (!) 102/59  Pulse: 100  Resp: 20  Temp: (!) 102 F (38.9 C)  SpO2: 99%    General: Awake, no distress.  CV:  Good peripheral perfusion.  Regular rate and rhythm  Resp:  Normal effort.  Equal breath sounds bilaterally.  Abd:  No distention.  Soft, nontender.  No rebound or guarding.  ED Results / Procedures / Treatments   RADIOLOGY  Chest x-ray performed earlier today read as negative.   MEDICATIONS ORDERED IN ED: Medications  vancomycin (VANCOREADY) IVPB 1500 mg/300 mL (has no administration in time range)  lactated ringers infusion (has no  administration in time range)  sodium chloride 0.9 % bolus 1,000 mL (has no administration in time range)  ceFEPIme (MAXIPIME) 2 g in sodium chloride 0.9 % 100 mL IVPB (has no administration in time range)  acetaminophen (TYLENOL) tablet 1,000 mg (1,000 mg Oral Given 04/13/22 1949)  ceFEPIme (MAXIPIME) 2 g in sodium chloride 0.9 % 100 mL IVPB (0 g Intravenous Stopped 04/13/22 2019)     IMPRESSION / MDM / ASSESSMENT AND PLAN / ED COURSE  I reviewed the triage vital signs and the nursing notes.  Patient's presentation is most consistent with acute presentation with potential threat to life or bodily function.  Patient presents emergency department for a fever currently on chemotherapy for neuroendocrine tumor.  Patient is febrile to 102 in the emergency department.  Patient's lab work shows he is neutropenic with an absolute white blood cell count of 1.1 and neutrophil count of 0.1.  We will check labs, cultures, COVID swab and urinalysis.  We will start the patient on cefepime and vancomycin.  I spoke to Dr. Rogue Bussing of oncology who agrees with the plan of care.  We  will admit to the hospitalist once the patient's emergency department work-up has been completed.  Patient's labs show CBC with a white blood cell count of 1.1 and absolute neutrophil count of 0.1, chemistry overall reassuring.  We will IV hydrate while awaiting further results.  Lactate reassuringly 0.7.  Patient's COVID test is negative.  Urinalysis shows no concerning finding.  No obvious source for the patient's fever.  However given her neutropenic status we will admit to the hospital service for continued IV antibiotics.  FINAL CLINICAL IMPRESSION(S) / ED DIAGNOSES   Neutropenic fever   Note:  This document was prepared using Dragon voice recognition software and may include unintentional dictation errors.   Harvest Dark, MD 04/13/22 2207

## 2022-04-13 NOTE — Consult Note (Signed)
CODE SEPSIS - PHARMACY COMMUNICATION  **Broad Spectrum Antibiotics should be administered within 1 hour of Sepsis diagnosis**  Time Code Sepsis Called/Page Received: 2038  Antibiotics Ordered: Vancomycin, Cefepime  Time of 1st antibiotic administration: 1949  Additional action taken by pharmacy: none  If necessary, Name of Provider/Nurse Contacted: n/a    Pearla Dubonnet ,PharmD Clinical Pharmacist  04/13/2022  8:32 PM

## 2022-04-13 NOTE — Progress Notes (Signed)
Pt notified/scheduled apt in smc- see RN's phone note today 04/13/22

## 2022-04-13 NOTE — Consult Note (Signed)
PHARMACY -  BRIEF ANTIBIOTIC NOTE   Pharmacy has received consult(s) for Vancomycin and Cefepime from an ED provider.  The patient's profile has been reviewed for ht/wt/allergies/indication/available labs.    One time order(s) placed for Vancomycin '1500mg'$  IV and Cefepime 2g IV x 1 dose each.  Further antibiotics/pharmacy consults should be ordered by admitting physician if indicated.                       Thank you, Pearla Dubonnet 04/13/2022  7:37 PM

## 2022-04-13 NOTE — H&P (Signed)
History and Physical    Ariana Wilson TWS:[id number] DOB: Jun 19, 1961 DOA: 04/13/2022  PCP: Rusty Aus, MD    Patient coming from:  Home    Chief Complaint:  Fever.    HPI:  Ariana Wilson is a 61 y.o. female seen in ed with complaints of fever referred by oncologist for [facility] of 0.  Patient initially called his oncologist when he had a fever of 100.3 along with myalgias and joint pain Levaquin was called and with follow-up appointment scheduled.  Patient went to his doctor evaluation and was given Levaquin which  he took but when his fever next day rose to 102 with chills patient was advised to come to the emergency room for evaluation therapy and management. Patient does not report any headaches blurred vision speech or gait issue chest pain palpitation abdominal pain nausea vomiting diarrhea bleeding or bruising or seizure history. Patient also does not report any bowel or bladder symptoms.  Patient denies any chest pain or any symptoms of shortness of breath. No cough and otherwise no symptoms.  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  Patient being followed by oncology team [facility] with Dr. Janese Banks.  Patient has received cisplatin etoposide chemotherapy  ED Course:   Vitals:   04/14/22 0030 04/14/22 0100 04/14/22 0117 04/14/22 0130  BP: (!) 87/41 (!) 73/33 (!) 96/42 95/74  Pulse: 75 77 80 86  Resp: '10 12 15 14  '$ Temp:      TempSrc:      SpO2: 98% 97% 100% 100%  Weight:      Height:      In the emergency room patient remains hypotensive. Asymptomatic. Afebrile, O2 sats of 96% on SpO2: 100 % on RA.  In the emergency room patient started on neutropenic precautions is hypotensive needing severe sepsis/septic shock criteria. CMP shows sodium 134 glucose 115 calcium 8.0, normal LFTs and normal kidney function. Lactic of 0.7 CBC shows a white count of 4.4 hemoglobin of 7.1 RDW 15.7 platelet counts of 171 and  neutrophil percentage of 0.1. In the emergency room case was discussed with on-call oncologist Dr. Rogue Bussing ED provider, advised to admit to medicine service and oncology consult- Dr. Rogue Bussing.  Patient in the emergency room received Vanco and cefepime for broad-spectrum antibiotic coverage.   Review of Systems:  Review of Systems  All other systems reviewed and are negative.   Past Medical History:  Diagnosis Date   Anemia    Arthritis    Atrial fibrillation ([place])    B12 deficiency    Dysrhythmia    Hyperlipidemia    Hypothyroidism    Menopause    Palpitations    Hx of, 25 years ago   Vertigo     Past Surgical History:  Procedure Laterality Date   BILATERAL SALPINGOOPHORECTOMY  2008   DILATION AND CURETTAGE OF UTERUS  08/24/2006   ESOPHAGOGASTRODUODENOSCOPY (EGD) WITH PROPOFOL N/A 03/01/2022   Procedure: ESOPHAGOGASTRODUODENOSCOPY (EGD) WITH PROPOFOL;  Surgeon: Lesly Rubenstein, MD;  Location: [facility] ENDOSCOPY;  Service: Endoscopy;  Laterality: N/A;   incision tendon sheath for trigger finger Right    ring finger   LAPAROSCOPIC SUPRACERVICAL HYSTERECTOMY  2008   Dr. Ammie Dalton; with BSO   NOSE SURGERY  03/2008   PORTA CATH INSERTION N/A 03/21/2022   Procedure: PORTA CATH INSERTION;  Surgeon: Algernon Huxley, MD;  Location: [place] [facility];  Service: Cardiovascular;  Laterality: N/A;  reports that she has never smoked. She has never used smokeless tobacco. She reports that she does not drink alcohol and does not use drugs.  Allergies  Allergen Reactions   Sulfa Antibiotics Hives    Says her tongue swelled a bit   Sulfonamide Derivatives Swelling    Tongue swelling    Family History  Problem Relation Age of Onset   Hyperlipidemia Mother    Hypertension Mother    Stroke Mother    Heart disease Father    Hyperlipidemia Father    Atrial fibrillation Father    Melanoma Father    Arrhythmia Sister        Atrial fibrillation   Heart disease Sister     Atrial fibrillation Sister    Heart attack Brother    Breast cancer Neg Hx     Prior to Admission medications   Medication Sig Start Date End Date Taking? Authorizing Provider  aspirin 325 MG tablet Take 325 mg by mouth daily.   Yes [provider]  Calcium Carbonate-Vitamin D 600-200 MG-UNIT TABS Take 1 tablet by mouth daily.     Yes [provider]  cetirizine (ZYRTEC) 10 MG tablet Take 10 mg by mouth daily.   Yes [provider]  Cholecalciferol 25 MCG (1000 UT) tablet Take 1,000 Units by mouth daily.   Yes [provider]  dexamethasone (DECADRON) 4 MG tablet Pt has 3 day chemo regimen and will need to take 2 tablets ( '8mg'$ ) on day 4 for each cycle of chemo. Make sure to eat food in the am and then take the med 03/30/22  Yes Sindy Guadeloupe, MD  DHEA 25 MG tablet Take 25 mg by mouth daily.   Yes [provider]  diclofenac Sodium (VOLTAREN) 1 % GEL Apply 2 g topically 4 (four) times daily. 12/19/19  Yes [provider]  diltiazem (TIAZAC) 180 MG 24 hr capsule Take 180 mg by mouth daily. 07/13/20  Yes [provider]  flecainide (TAMBOCOR) 50 MG tablet Take 1 tablet by mouth 2 (two) times daily. 07/09/20  Yes [provider]  levofloxacin (LEVAQUIN) 500 MG tablet Take 1 tablet (500 mg total) by mouth daily. 04/13/22  Yes Borders, Kirt Boys, NP  metoprolol tartrate (LOPRESSOR) 25 MG tablet TAKE 1 TABLET (25 MG TOTAL) BY MOUTH 2 (TWO) TIMES DAILY AS NEEDED. 09/27/18 04/13/22 Yes Gollan, Kathlene November, MD  mometasone (NASONEX) 50 MCG/ACT nasal spray Place 2 sprays into the nose daily.   Yes [provider]  pantoprazole (PROTONIX) 40 MG tablet Take 40 mg by mouth daily.   Yes [provider]  potassium chloride SA (KLOR-CON M) 20 MEQ tablet Take 1 tablet (20 mEq total) by mouth 2 (two) times daily. 04/04/22  Yes Jacquelin Hawking, NP  Saccharomyces boulardii (PROBIOTIC) 250 MG CAPS Take 1 capsule by mouth daily.   Yes  [provider]  simvastatin (ZOCOR) 20 MG tablet TAKE 1 TABLET BY MOUTH EVERY EVENING. 12/25/17  Yes Gollan, Kathlene November, MD  vitamin B-12 (CYANOCOBALAMIN) 1000 MCG tablet Take 1,000 mcg by mouth daily.   Yes [provider]  Zinc Citrate-Phytase (ZYTAZE) 25-500 MG CAPS Take 1 Dose by mouth daily.   Yes [provider]  amoxicillin-clavulanate (AUGMENTIN) 875-125 MG tablet Take 1 tablet by mouth 2 (two) times daily. Patient not taking: Reported on 04/13/2022 04/02/22   Sindy Guadeloupe, MD  estradiol (ESTRACE) 0.1 MG/GM vaginal cream Place 1 Applicatorful vaginally at bedtime. Patient not taking:  Reported on 03/04/2022    [provider]  L-Lysine 1000 MG TABS Take 1,000 mg by mouth daily. Patient not taking: Reported on 03/29/2022    [provider]  lidocaine-prilocaine (EMLA) cream Apply to affected area once 03/25/22   Sindy Guadeloupe, MD  LORazepam (ATIVAN) 0.5 MG tablet Take 1 tablet (0.5 mg total) by mouth every 6 (six) hours as needed (Nausea or vomiting). 03/29/22   Sindy Guadeloupe, MD  meclizine (ANTIVERT) 25 MG tablet Take 1 tablet (25 mg total) by mouth 3 (three) times daily as needed for dizziness. Patient not taking: Reported on 03/04/2022 19/62/22   Copland, Deirdre Evener, PA-C  Omega-3 Fatty Acids (FISH OIL) 1200 MG CAPS Take by mouth daily.  Patient not taking: Reported on 03/21/2022    [provider]  omeprazole (PRILOSEC) 20 MG capsule  09/23/18   [provider]  ondansetron (ZOFRAN) 8 MG tablet Take 1 tablet (8 mg total) by mouth 2 (two) times daily as needed. Start on the third day after cisplatin chemotherapy. Patient not taking: Reported on 03/29/2022 03/25/22   Sindy Guadeloupe, MD  prochlorperazine (COMPAZINE) 10 MG tablet Take 1 tablet (10 mg total) by mouth every 6 (six) hours as needed (Nausea or vomiting). Patient not taking: Reported on 03/29/2022 03/25/22   Sindy Guadeloupe, MD  traZODone (DESYREL) 50 MG tablet Take 50 mg by  mouth as needed.  Patient not taking: Reported on 03/29/2022 02/09/14   [provider]    Physical Exam: Vitals:   04/14/22 0030 04/14/22 0100 04/14/22 0117 04/14/22 0130  BP: (!) 87/41 (!) 73/33 (!) 96/42 95/74  Pulse: 75 77 80 86  Resp: '10 12 15 14  '$ Temp:      TempSrc:      SpO2: 98% 97% 100% 100%  Weight:      Height:       Physical Exam Vitals and nursing note reviewed.  Constitutional:      General: She is not in acute distress.    Appearance: Normal appearance. She is not ill-appearing, toxic-appearing or diaphoretic.  HENT:     Head: Normocephalic and atraumatic.     Right Ear: Hearing and external ear normal.     Left Ear: Hearing and external ear normal.     Nose: Nose normal. No nasal deformity.     Mouth/Throat:     Lips: Pink.     Mouth: Mucous membranes are moist.     Tongue: No lesions.     Pharynx: Oropharynx is clear.  Eyes:     Extraocular Movements: Extraocular movements intact.     Pupils: Pupils are equal, round, and reactive to light.  Neck:     Vascular: No carotid bruit.  Cardiovascular:     Rate and Rhythm: Normal rate and regular rhythm.     Pulses: Normal pulses.     Heart sounds: Normal heart sounds.  Pulmonary:     Effort: Pulmonary effort is normal.     Breath sounds: Examination of the right-middle field reveals rales. Examination of the left-middle field reveals rales. Examination of the right-lower field reveals rales. Examination of the left-lower field reveals rales. Rales present. No wheezing or rhonchi.  Abdominal:     General: Bowel sounds are normal. There is no distension.     Palpations: Abdomen is soft. There is no mass.     Tenderness: There is no abdominal tenderness. There is no guarding.     Hernia: No hernia is  present.  Musculoskeletal:     Right lower leg: No edema.     Left lower leg: No edema.  Skin:    General: Skin is warm.  Neurological:     General: No focal deficit present.     Mental Status: She is  alert and oriented to person, place, and time.     Cranial Nerves: Cranial nerves 2-12 are intact.     Motor: Motor function is intact.  Psychiatric:        Attention and Perception: Attention normal.        Mood and Affect: Mood normal.        Speech: Speech normal.        Behavior: Behavior normal. Behavior is cooperative.        Cognition and Memory: Cognition normal.      Labs on Admission: I have personally reviewed following labs and imaging studies BMET Recent Labs  Lab 04/08/22 0911 04/13/22 1119 04/13/22 1919  NA 135 134* 134*  K 4.5 3.8 3.9  CL 105 103 104  CO2 '25 24 24  '$ BUN '11 13 14  '$ CREATININE 0.70 0.83 0.87  GLUCOSE 98 99 115*   Electrolytes Recent Labs  Lab 04/08/22 0911 04/13/22 1119 04/13/22 1919  CALCIUM 8.6* 8.4* 8.0*  MG  --  2.3  --    Sepsis Markers Recent Labs  Lab 04/13/22 1919 04/13/22 2109  LATICACIDVEN 0.7 1.0  PROCALCITON <0.10  --    ABG No results for input(s): "PHART", "PCO2ART", "PO2ART" in the last 168 hours. Liver Enzymes Recent Labs  Lab 04/13/22 1119 04/13/22 1919  AST 11* 11*  ALT 11 10  ALKPHOS 64 65  BILITOT 0.6 0.5  ALBUMIN 3.5 3.4*   Cardiac Enzymes No results for input(s): "TROPONINI", "PROBNP" in the last 168 hours. No results found for: "DDIMER" Coag's No results for input(s): "APTT", "INR" in the last 168 hours.  Recent Results (from the past 240 hour(s))  Resp Panel by RT-PCR (Flu A&B, Covid) Anterior Nasal Swab     Status: None   Collection Time: 04/13/22  8:52 PM   Specimen: Anterior Nasal Swab  Result Value Ref Range Status   SARS Coronavirus 2 by RT PCR NEGATIVE NEGATIVE Final    Comment: (NOTE) SARS-CoV-2 target nucleic acids are NOT DETECTED.  The SARS-CoV-2 RNA is generally detectable in upper respiratory specimens during the acute phase of infection. The lowest concentration of SARS-CoV-2 viral copies this assay can detect is 138 copies/mL. A negative result does not preclude  SARS-Cov-2 infection and should not be used as the sole basis for treatment or other patient management decisions. A negative result may occur with  improper specimen collection/handling, submission of specimen other than nasopharyngeal swab, presence of viral mutation(s) within the areas targeted by this assay, and inadequate number of viral copies(<138 copies/mL). A negative result must be combined with clinical observations, patient history, and epidemiological information. The expected result is Negative.  Fact Sheet for Patients:  EntrepreneurPulse.com.au  Fact Sheet for Healthcare Providers:  IncredibleEmployment.be  This test is no t yet approved or cleared by the [place] FDA and  has been authorized for detection and/or diagnosis of SARS-CoV-2 by FDA under an Emergency Use Authorization (EUA). This EUA will remain  in effect (meaning this test can be used) for the duration of the COVID-19 declaration under Section 564(b)(1) of the Act, 21 U.S.C.section 360bbb-3(b)(1), unless the authorization is terminated  or revoked sooner.       Influenza  A by PCR NEGATIVE NEGATIVE Final   Influenza B by PCR NEGATIVE NEGATIVE Final    Comment: (NOTE) The Xpert Xpress SARS-CoV-2/FLU/RSV plus assay is intended as an aid in the diagnosis of influenza from Nasopharyngeal swab specimens and should not be used as a sole basis for treatment. Nasal washings and aspirates are unacceptable for Xpert Xpress SARS-CoV-2/FLU/RSV testing.  Fact Sheet for Patients: EntrepreneurPulse.com.au  Fact Sheet for Healthcare Providers: IncredibleEmployment.be  This test is not yet approved or cleared by the [place] FDA and has been authorized for detection and/or diagnosis of SARS-CoV-2 by FDA under an Emergency Use Authorization (EUA). This EUA will remain in effect (meaning this test can be used) for the duration of  the COVID-19 declaration under Section 564(b)(1) of the Act, 21 U.S.C. section 360bbb-3(b)(1), unless the authorization is terminated or revoked.  Performed at [facility], [address] [place],  [address]      Current Facility-Administered Medications:    ceFEPIme (MAXIPIME) 2 g in sodium chloride 0.9 % 100 mL IVPB, 2 g, Intravenous, Q8H, Paduchowski, Lennette Bihari, MD   Chlorhexidine Gluconate Cloth 2 % PADS 6 each, 6 each, Topical, Q0600, Para Skeans, MD   dexamethasone (DECADRON) tablet 1 mg, 1 mg, Oral, Daily, Para Skeans, MD   lactated ringers infusion, , Intravenous, Continuous, Harvest Dark, MD, Last Rate: 150 mL/hr at 04/13/22 2051, New Bag at 04/13/22 2051   pantoprazole (PROTONIX) injection 40 mg, 40 mg, Intravenous, Q12H, Florina Ou V, MD, 40 mg at 04/13/22 2346   vancomycin (VANCOREADY) IVPB 1250 mg/250 mL, 1,250 mg, Intravenous, Q24H, Harvest Dark, MD  COVID-19 Labs No results for input(s): "DDIMER", "FERRITIN", "LDH", "CRP" in the last 72 hours. Lab Results  Component Value Date   [place] NEGATIVE 04/13/2022    Radiological Exams on Admission: CT Angio Chest Pulmonary Embolism (PE) W or WO Contrast  Result Date: 04/13/2022 CLINICAL DATA:  Pulmonary embolism (PE) suspected, high prob rales on exam bl. Neutropenic fever. History of esophageal cancer, active chemotherapy. EXAM: CT ANGIOGRAPHY CHEST WITH CONTRAST TECHNIQUE: Multidetector CT imaging of the chest was performed using the standard protocol during bolus administration of intravenous contrast. Multiplanar CT image reconstructions and MIPs were obtained to evaluate the vascular anatomy. RADIATION DOSE REDUCTION: This exam was performed according to the departmental dose-optimization program which includes automated exposure control, adjustment of the mA and/or kV according to patient size and/or use of iterative reconstruction technique. CONTRAST:  83m OMNIPAQUE IOHEXOL 350 MG/ML  SOLN COMPARISON:  Chest radiograph earlier today. CT 03/17/2022. PET CT 03/22/2022 FINDINGS: Cardiovascular: There are no filling defects within the pulmonary arteries to suggest pulmonary embolus. Mild aortic atherosclerosis, no aneurysm. There are coronary artery calcifications. Heart is normal in size. No pericardial effusion. Mediastinum/Nodes: There is wall thickening of the distal esophagus extending to the gastroesophageal junction consistent with known malignancy. Mild adjacent paraesophageal fat stranding. No paraesophageal collection or pneumomediastinum. Upper esophagus is minimally patulous. The paraesophageal lymph node on prior exam is difficult to delineate from the esophageal wall thickening currently. Left upper paratracheal no new the thoracic inlet measures 14 mm, series 5, image 28, this previously measured 10 mm. 14 mm left upper paratracheal node series 5, image 52, previously 8 mm. There is a 13 mm right supraclavicular node, series 5, image 46, previously 11 mm. There are increased number of right upper paratracheal nodes from prior exam, that are subcentimeter. Lungs/Pleura: Mild breathing motion artifact limits detailed parenchymal assessment. Scattered areas of bandlike atelectasis. Heterogeneous  pulmonary parenchyma. No confluent consolidation. No visible pulmonary nodule. No pleural effusion. Upper Abdomen: No acute findings. Musculoskeletal: There are no acute or suspicious osseous abnormalities. No chest wall soft tissue abnormalities. Review of the MIP images confirms the above findings. IMPRESSION: 1. No pulmonary embolus. 2. Heterogeneous pulmonary parenchyma may represent small airways disease. Scattered areas of bandlike atelectasis. No evidence of pneumonia. 3. Distal esophageal wall thickening extending to the gastroesophageal junction consistent with known malignancy. There is mild adjacent paraesophageal fat stranding. 4. Mediastinal adenopathy with slight increase size of  multiple lymph nodes from prior exam. Attention on subsequent staging exams recommended. Aortic Atherosclerosis (ICD10-I70.0). Electronically Signed   By: Keith Rake M.D.   On: 04/13/2022 23:25   DG Chest 2 View  Result Date: 04/13/2022 CLINICAL DATA:  Neutropenic fever. EXAM: CHEST - 2 VIEW COMPARISON:  10/30/2013 FINDINGS: Porta catheter on the right with tip at the SVC. Normal heart size and mediastinal contours. There is no edema, consolidation, effusion, or pneumothorax. IMPRESSION: Negative for pneumonia. Electronically Signed   By: Jorje Guild M.D.   On: 04/13/2022 11:00    EKG: Independently reviewed.  EKG shows sinus rhythm at 88 with PACs and no ST-T wave changes.   Assessment and Plan: * Febrile neutropenia (HCC)    Latest Ref Rng & Units 04/13/2022    7:19 PM 04/13/2022   11:19 AM 04/08/2022    9:11 AM  CBC  WBC 4.0 - 10.5 K/uL 1.1  1.1  2.8   Hemoglobin 12.0 - 15.0 g/dL 7.1  7.5  7.3   Hematocrit 36.0 - 46.0 % 22.4  23.2  22.7   Platelets 150 - 400 K/uL 171  191  143   Patient is asymptomatic. Meeting severe sepsis criteria. Oncology consult requested by a.m. message. We will defer to oncology for? GSF and additional recommendations. Lactic acid negative, procalcitonin negative. We will continue on broad-spectrum IV antibiotic regimen with pharmacy consult.    Hypotension Vitals:   04/13/22 2200 04/13/22 2215 04/13/22 2230 04/13/22 2245  BP: (!) 87/42 (!) 98/45 (!) 89/43 (!) 81/41   04/13/22 2300 04/13/22 2330 04/13/22 2348 04/14/22 0000  BP: (!) 91/42 (!) 84/46 (!) 85/41 (!) 101/38   04/14/22 0030 04/14/22 0100 04/14/22 0117 04/14/22 0130  BP: (!) 87/41 (!) 73/33 (!) 96/42 95/74    Intake/Output Summary (Last 24 hours) at 04/14/2022 0152 Last data filed at 04/13/2022 2354 Gross per 24 hour  Intake 1358 ml  Output --  Net 1358 ml  Blood pressure 95/74, pulse 86, temperature 97.9 F (36.6 C), temperature source Oral, resp. rate 14, height '5\' 1"'$  (1.549 m),  weight 73.3 kg, SpO2 100 %. we will continue to monitor.  Home bp meds held.  We will repeat additional LR bolus if bp drops again.      Primary malignant neuroendocrine neoplasm of esophagus [facility]) Oncology consulted. We will continue patient on vancomycin and cefepime.  Essential hypertension Vitals:   04/13/22 1905 04/13/22 2054 04/13/22 2100 04/13/22 2115  BP: (!) 102/59 (!) 85/46 (!) 90/41 (!) 88/34   04/13/22 2130 04/13/22 2145 04/13/22 2200 04/13/22 2215  BP: (!) 96/42 (!) 100/46 (!) 87/42 (!) 98/45   04/13/22 2230 04/13/22 2245 04/13/22 2300  BP: (!) 89/43 (!) 81/41 (!) 91/42  Blood pressures have been consistently under 102 at the most. We will hold patient's home regimen of blood pressure meds. Admit patient to stepdown. Bedrest fall precautions. Continue with IV fluids. Low threshold for Levophed as needed.  Anemia Hold aspirin. Protonix IV.   ATRIAL FIBRILLATION Currently patient is in sinus rhythm. Secondary to hypotension however I will hold patient's diltiazem today. Home regimen with flecainide and metoprolol also held. Aspirin 325 we will hold secondary to anemia. Although I do not suspect hemorrhagic shock.     DVT prophylaxis:  Scd;s   Code Status:  Full code    Family Communication:  Deboard,Tim (Spouse)  [phone] (Mobile   Disposition Plan:  Home    Consults called:  Oncology.  Admission status: Inpatient.     Para Skeans MD [facility]  6 PM- 2 AM. Please contact me via secure Chat 6 PM-2 AM. [phone] ( Pager ) To contact the [facility] Attending or Consulting provider [place] or covering provider during after hours [place], for this patient.   Check the care team in [facility] and look for a) attending/consulting [facility] provider listed and b) the [facility] team listed Log into [email or web address] and use [place]'s universal password to access. If you do not have the password, please contact the hospital operator. Locate the [facility]  provider you are looking for under [facility] and page to a number that you can be directly reached. If you still have difficulty reaching the provider, please page the [facility] (Director on Call) for the Hospitalists listed on amion for assistance. [email or web address] 04/14/2022, 1:53 AM

## 2022-04-13 NOTE — Progress Notes (Signed)
Symptom Management and [place] at [facility] [phone] [phone]  Patient Care Team: Rusty Aus, MD as PCP - General (Internal Medicine) Clent Jacks, RN as Oncology Nurse Navigator   NAME OF PATIENT: Ariana Wilson  [id number]  02-Sep-1961   DATE OF VISIT: 04/13/22  REASON FOR CONSULT: BRAYLA PAT is a 61 y.o. 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  She was diagnosed with cancer in June 2023.  Patient is on treatment with cisplatin etoposide chemotherapy.  INTERVAL HISTORY: Patient received day 1, cycle 1 cisplatin/etoposide on 03/30/2022 and completed day 3 etoposide on 04/01/2022.  She presents to clinic today for evaluation of intermittent fevers.  She called in overnight and spoke with on-call oncologist and was started on Levaquin.  Patient took first dose of Levaquin this morning.  Home COVID test was negative.  Other than intermittent fevers, patient denies any other symptomatic complaints or concerns.  She has no urinary symptoms, GI symptoms, or respiratory symptoms.  Denies any neurologic complaints. Denies any easy bleeding or bruising. Reports good appetite and denies weight loss. Denies chest pain. Denies any nausea, vomiting, constipation, or diarrhea. Denies urinary complaints. Patient offers no further specific complaints today.  SOCIAL HISTORY:     reports that she has never smoked. She has never used smokeless tobacco. She reports that she does not drink alcohol and does not use drugs.  Patient is married and lives at home with her husband.  She is retired Pharmacist, hospital.  ADVANCE DIRECTIVES:    CODE STATUS:    PAST MEDICAL HISTORY: Past Medical History:  Diagnosis Date   Anemia    Arthritis    Atrial fibrillation ([place])    B12 deficiency    Dysrhythmia    Hyperlipidemia    Hypothyroidism    Menopause     Palpitations    Hx of, 25 years ago   Vertigo     PAST SURGICAL HISTORY:  Past Surgical History:  Procedure Laterality Date   BILATERAL SALPINGOOPHORECTOMY  2008   DILATION AND CURETTAGE OF UTERUS  08/24/2006   ESOPHAGOGASTRODUODENOSCOPY (EGD) WITH PROPOFOL N/A 03/01/2022   Procedure: ESOPHAGOGASTRODUODENOSCOPY (EGD) WITH PROPOFOL;  Surgeon: Lesly Rubenstein, MD;  Location: [facility] ENDOSCOPY;  Service: Endoscopy;  Laterality: N/A;   incision tendon sheath for trigger finger Right    ring finger   LAPAROSCOPIC SUPRACERVICAL HYSTERECTOMY  2008   Dr. Ammie Dalton; with BSO   NOSE SURGERY  03/2008   PORTA CATH INSERTION N/A 03/21/2022   Procedure: PORTA CATH INSERTION;  Surgeon: Algernon Huxley, MD;  Location: [facility];  Service: Cardiovascular;  Laterality: N/A;    HEMATOLOGY/ONCOLOGY HISTORY:  Oncology History  Primary malignant neuroendocrine neoplasm of esophagus ([place])  03/20/2022 Initial Diagnosis   Primary malignant neuroendocrine neoplasm of esophagus ([place])   03/20/2022 Cancer Staging   Staging form: Esophagus - Other Histologies, AJCC 8th Edition - Clinical stage from 03/20/2022: cTX, cN1, cM1, G3 - Signed by Sindy Guadeloupe, MD on 03/20/2022 Histopathologic type: Large cell neuroendocrine carcinoma Histologic grading system: 3 grade system   03/30/2022 -  Chemotherapy   Patient is on Treatment Plan : NEUROENDOCRINE GE junctionCisplatin/Tecentriq D1 + Etoposide D1-3 q21d       ALLERGIES:  is allergic to sulfa antibiotics and sulfonamide derivatives.  MEDICATIONS:  Current Outpatient Medications  Medication Sig Dispense Refill   aspirin 325 MG tablet Take 325 mg  by mouth once.     Calcium Carbonate-Vitamin D 600-200 MG-UNIT TABS Take 1 tablet by mouth daily.       cetirizine (ZYRTEC) 10 MG tablet Take 10 mg by mouth daily.     Cholecalciferol 25 MCG (1000 UT) tablet Take by mouth.     DHEA 25 MG tablet Take 25 mg by mouth daily.     diclofenac Sodium (VOLTAREN) 1 % GEL  Apply 2 g topically 4 (four) times daily.     diltiazem (TIAZAC) 180 MG 24 hr capsule Take 180 mg by mouth daily.     flecainide (TAMBOCOR) 50 MG tablet Take 1 tablet by mouth 2 (two) times daily.     levofloxacin (LEVAQUIN) 500 MG tablet Take 1 tablet (500 mg total) by mouth daily. 5 tablet 0   lidocaine-prilocaine (EMLA) cream Apply to affected area once 30 g 3   LORazepam (ATIVAN) 0.5 MG tablet Take 1 tablet (0.5 mg total) by mouth every 6 (six) hours as needed (Nausea or vomiting). 30 tablet 0   metoprolol tartrate (LOPRESSOR) 25 MG tablet TAKE 1 TABLET (25 MG TOTAL) BY MOUTH 2 (TWO) TIMES DAILY AS NEEDED. 60 tablet 5   mometasone (NASONEX) 50 MCG/ACT nasal spray Place 2 sprays into the nose daily.     pantoprazole (PROTONIX) 40 MG tablet Take 40 mg by mouth daily.     potassium chloride SA (KLOR-CON M) 20 MEQ tablet Take 1 tablet (20 mEq total) by mouth 2 (two) times daily. 14 tablet 0   Saccharomyces boulardii (PROBIOTIC) 250 MG CAPS Take by mouth.     simvastatin (ZOCOR) 20 MG tablet TAKE 1 TABLET BY MOUTH EVERY EVENING. 30 tablet 0   vitamin B-12 (CYANOCOBALAMIN) 1000 MCG tablet Take 1,000 mcg by mouth daily.     Zinc Citrate-Phytase (ZYTAZE) 25-500 MG CAPS Take 1 Dose by mouth daily.     amoxicillin-clavulanate (AUGMENTIN) 875-125 MG tablet Take 1 tablet by mouth 2 (two) times daily. 14 tablet 0   dexamethasone (DECADRON) 4 MG tablet Pt has 3 day chemo regimen and will need to take 2 tablets ( '8mg'$ ) on day 4 for each cycle of chemo. Make sure to eat food in the am and then take the med 30 tablet 1   estradiol (ESTRACE) 0.1 MG/GM vaginal cream Place 1 Applicatorful vaginally at bedtime. (Patient not taking: Reported on 03/04/2022)     L-Lysine 1000 MG TABS Take 1,000 mg by mouth daily. (Patient not taking: Reported on 03/29/2022)     meclizine (ANTIVERT) 25 MG tablet Take 1 tablet (25 mg total) by mouth 3 (three) times daily as needed for dizziness. (Patient not taking: Reported on 03/04/2022)  30 tablet 0   Omega-3 Fatty Acids (FISH OIL) 1200 MG CAPS Take by mouth daily.  (Patient not taking: Reported on 03/21/2022)     omeprazole (PRILOSEC) 20 MG capsule  (Patient not taking: Reported on 03/29/2022)     ondansetron (ZOFRAN) 8 MG tablet Take 1 tablet (8 mg total) by mouth 2 (two) times daily as needed. Start on the third day after cisplatin chemotherapy. (Patient not taking: Reported on 03/29/2022) 30 tablet 1   prochlorperazine (COMPAZINE) 10 MG tablet Take 1 tablet (10 mg total) by mouth every 6 (six) hours as needed (Nausea or vomiting). (Patient not taking: Reported on 03/29/2022) 30 tablet 1   traZODone (DESYREL) 50 MG tablet Take 50 mg by mouth as needed.  (Patient not taking: Reported on 03/29/2022)     No current facility-administered  medications for this visit.   Facility-Administered Medications Ordered in Other Visits  Medication Dose Route Frequency Provider Last Rate Last Admin   sodium chloride flush (NS) 0.9 % injection 10 mL  10 mL Intravenous Once Sindy Guadeloupe, MD        VITAL SIGNS: Resp 18  There were no vitals filed for this visit.  Estimated body mass index is 30.02 kg/m as calculated from the following:   Height as of 03/21/22: 5' (1.524 m).   Weight as of 04/08/22: 153 lb 11.2 oz (69.7 kg).  LABS: CBC:    Component Value Date/Time   WBC 1.1 (LL) 04/13/2022 1119   HGB 7.5 (L) 04/13/2022 1119   HCT 23.2 (L) 04/13/2022 1119   PLT 191 04/13/2022 1119   MCV 90.6 04/13/2022 1119   NEUTROABS PENDING 04/13/2022 1119   LYMPHSABS PENDING 04/13/2022 1119   MONOABS PENDING 04/13/2022 1119   EOSABS PENDING 04/13/2022 1119   BASOSABS PENDING 04/13/2022 1119   Comprehensive Metabolic Panel:    Component Value Date/Time   NA 135 04/08/2022 0911   K 4.5 04/08/2022 0911   CL 105 04/08/2022 0911   CO2 25 04/08/2022 0911   BUN 11 04/08/2022 0911   CREATININE 0.70 04/08/2022 0911   GLUCOSE 98 04/08/2022 0911   CALCIUM 8.6 (L) 04/08/2022 0911   AST 16 04/04/2022  1101   ALT 14 04/04/2022 1101   ALKPHOS 46 04/04/2022 1101   BILITOT 0.8 04/04/2022 1101   BILITOT 0.6 03/06/2015 0910   PROT 6.2 (L) 04/04/2022 1101   PROT 6.6 03/06/2015 0910   ALBUMIN 3.4 (L) 04/04/2022 1101   ALBUMIN 4.6 03/06/2015 0910    RADIOGRAPHIC STUDIES: DG Chest 2 View  Result Date: 04/13/2022 CLINICAL DATA:  Neutropenic fever. EXAM: CHEST - 2 VIEW COMPARISON:  10/30/2013 FINDINGS: Porta catheter on the right with tip at the SVC. Normal heart size and mediastinal contours. There is no edema, consolidation, effusion, or pneumothorax. IMPRESSION: Negative for pneumonia. Electronically Signed   By: Jorje Guild M.D.   On: 04/13/2022 11:00   MR Brain W Wo Contrast  Result Date: 04/03/2022 CLINICAL DATA:  Esophageal cancer, intracranial staging EXAM: MRI HEAD WITHOUT AND WITH CONTRAST TECHNIQUE: Multiplanar, multiecho pulse sequences of the brain and surrounding structures were obtained without and with intravenous contrast. CONTRAST:  7.23m GADAVIST GADOBUTROL 1 MMOL/ML IV SOLN COMPARISON:  03/22/2022 PET-CT FINDINGS: Brain: No enhancement or swelling to suggest metastatic disease. Irregularly-shaped area of T2 hyperintensity in the right cerebral white matter with underlying developmental venous anomaly. No acute hemorrhage or swelling superimposed. No acute infarct, hydrocephalus, or collection. Vascular: Major flow voids and vascular enhancements are preserved Skull and upper cervical spine: Negative for marrow lesion. Sinuses/Orbits: Negative IMPRESSION: 1. Negative for metastatic disease. 2. 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 Electronically Signed   By: JJorje GuildM.D.   On: 04/03/2022 08:24   NM PET Image Initial (PI) Skull Base To Thigh  Result Date: 03/22/2022 CLINICAL DATA:  Initial treatment strategy for esophageal carcinoma. EXAM: NUCLEAR MEDICINE PET SKULL  BASE TO THIGH TECHNIQUE: 8.6 mCi F-18 FDG was injected intravenously. Full-ring PET imaging was performed from the skull base to thigh after the radiotracer. CT data was obtained and used for attenuation correction and anatomic localization. Fasting blood glucose: 109 mg/dl COMPARISON:  CT on 03/17/2022 FINDINGS: Mediastinal blood-pool activity (background): SUV max = 2.2 Liver activity (reference): SUV max =  N/A NECK: 1.1 cm left level 4 lower jugular lymph node on image 50/0 is hypermetabolic, with SUV max of 5.3. 1.1 cm right supraclavicular lymph node on image 93/8 is hypermetabolic, with SUV max of 5.1. Incidental CT findings:  None. CHEST: Concentric hypermetabolic wall thickening is seen involving the distal thoracic esophagus and proximal stomach,, with SUV max of 11.0, consistent with known esophageal carcinoma. A 1 cm left paraesophageal lymph node is seen on image 111/2, which is hypermetabolic with SUV max of 3.9. No other hypermetabolic lymphadenopathy seen within the thorax. No suspicious pulmonary nodules seen on CT images. Incidental CT findings:  None. ABDOMEN/PELVIS: No abnormal hypermetabolic activity within the liver, pancreas, adrenal glands, or spleen. Mild hypermetabolic lymphadenopathy seen in the gastrohepatic ligament, which shows SUV max of 6.8. 11 mm lymph node in porta hepatis is also hypermetabolic, with SUV max of 5.9. 1.8 cm left paraaortic lymph node is hypermetabolic, with SUV max of 7.9. No hypermetabolic lymph nodes seen within the pelvis. Incidental CT findings:  None. SKELETON: No focal hypermetabolic bone lesions to suggest skeletal metastasis. Incidental CT findings:  None. IMPRESSION: Hypermetabolic wall thickening involving the distal thoracic esophagus and proximal stomach, consistent with known esophageal carcinoma. Hypermetabolic lymphadenopathy in the neck, chest, and abdomen as described above, consistent with metastatic disease. Electronically Signed   By: Marlaine Hind  M.D.   On: 03/22/2022 13:50   PERIPHERAL VASCULAR CATHETERIZATION  Result Date: 03/21/2022 See surgical note for result.  CT CHEST ABDOMEN PELVIS W CONTRAST  Result Date: 03/17/2022 CLINICAL DATA:  Esophageal cancer, staging. EXAM: CT CHEST, ABDOMEN, AND PELVIS WITH CONTRAST TECHNIQUE: Multidetector CT imaging of the chest, abdomen and pelvis was performed following the standard protocol during bolus administration of intravenous contrast. RADIATION DOSE REDUCTION: This exam was performed according to the departmental dose-optimization program which includes automated exposure control, adjustment of the mA and/or kV according to patient size and/or use of iterative reconstruction technique. CONTRAST:  148m OMNIPAQUE IOHEXOL 300 MG/ML  SOLN COMPARISON:  CT November 12, 2010 FINDINGS: CT CHEST FINDINGS Cardiovascular: Aortic atherosclerosis without thoracic aortic aneurysm. No central pulmonary embolus on this nondedicated study. Coronary artery calcifications. No significant pericardial effusion/thickening. Normal size heart. Mediastinum/Nodes: No suspicious thyroid nodule. Prominent/enlarged paraesophageal and paratracheal mediastinal lymph nodes. For reference: -lobular left upper paratracheal lymph node near the thoracic inlet measures 11 mm in short axis on image 9/2. -left lower paraesophageal lymph node near the hiatus measures 11 mm in short axis on image 42/2. Asymmetric thickening of the distal esophagus measures proximally 5.0 cm in length on coronal image 81/4 and 3.2 x 2.4 cm in maximum axial dimension on image 44/2 with the asymmetric thickening extending along the lesser curvature of the stomach for instance on axial image 51/2. No pathologically enlarged hilar or axillary lymph nodes. Lungs/Pleura: No suspicious pulmonary nodules or masses. No focal airspace consolidation. No pleural effusion. No pneumothorax. Musculoskeletal: No chest wall mass or suspicious bone lesions identified. Thoracic  spondylosis. CT ABDOMEN PELVIS FINDINGS Hepatobiliary: No suspicious hepatic lesion. Gallbladder is unremarkable. No biliary ductal dilation. Pancreas: No pancreatic ductal dilation or evidence of acute inflammation. Spleen: No splenomegaly or focal splenic lesion. Adrenals/Urinary Tract: Bilateral adrenal glands appear normal. No hydronephrosis. No suspicious renal mass. Urinary bladder is unremarkable for degree of degree of distension. Stomach/Bowel: Radiopaque enteric contrast material traverses distal loops of small bowel. Stomach is distended with ingested material, contrast and gas with some asymmetric wall thickening extending from the distal esophageal mass along the cardia/fundus  along the lesser curvature of the stomach. No pathologic dilation of small or large bowel. Moderate volume of formed stool throughout the colon suggestive of constipation. Vascular/Lymphatic: Gastrohepatic and retroperitoneal adenopathy to the level of the renal veins. For reference: -gastrohepatic lymph node measures 13 mm in short axis on image 52/2 -lobular left periaortic lymph node at the level of the left renal hilum measures 17 mm in short axis on image 66/2. No pathologically enlarged pelvic lymph nodes. Aortic atherosclerosis without aneurysmal dilation. Reproductive: Status post hysterectomy. No adnexal masses. Other: No significant abdominopelvic free fluid. No discrete peritoneal or omental nodularity. Musculoskeletal: No aggressive lytic or blastic lesion of bone. IMPRESSION: 1. Asymmetric distal esophageal wall thickening, consistent with patient's known esophageal neoplasm. With the thickening appearing to extend along the lesser curvature of the stomach through the gastric cardia and proximal fundus with at least a portion of the thickening related to underdistention of the stomach. 2. Paratracheal/paraesophageal, gastrohepatic and retroperitoneal adenopathy, is most consistent with nodal disease involvement. 3. No  evidence of solid organ or osseous metastatic disease in the chest, abdomen or pelvis. 4.  Aortic Atherosclerosis (ICD10-I70.0). Electronically Signed   By: Dahlia Bailiff M.D.   On: 03/17/2022 17:02    PERFORMANCE STATUS (ECOG) : 1 - Symptomatic but completely ambulatory  Review of Systems Unless otherwise noted, a complete review of systems is negative.  Physical Exam General: NAD Cardiovascular: regular rate and rhythm Pulmonary: clear ant fields Abdomen: soft, nontender, + bowel sounds GU: no suprapubic tenderness Extremities: no edema, no joint deformities Skin: no rashes Neurological: Grossly nonfocal  IMPRESSION: Patient presents for evaluation of intermittent fevers.  Tmax 100.4.  She is found to be neutropenic with ANC of 0.  She is already started p.o. Levaquin this morning.  Vitals are stable and exam is benign.  Chest x-ray clear.  UA not definitive for infection.  Blood cultures pending.  Discussed with Dr. Janese Banks and will proceed with outpatient antibiotics in addition to close clinic monitoring.  ED triggers and neutropenic precautions reviewed in detail with patient/husband.  They understand and agreed to utilize the ED for any changes in symptoms or clinical decline.  Levaquin prescribed through 8/5 but will extend for 10 days.    PLAN: -IV fluids today -Continue p.o. Levaquin (10 days) -Patient utilize ED for any change in symptoms, worsening fever, or clinical decline -RTC on Friday for labs/fluids  Case and plan discussed with Dr. Janese Banks   Patient expressed understanding and was in agreement with this plan. She also understands that She can call clinic at any time with any questions, concerns, or complaints.   Thank you for allowing me to participate in the care of this very pleasant patient.   Time Total: 20 minutes  Visit consisted of counseling and education dealing with the complex and emotionally intense issues of symptom management in the setting of  serious illness.Greater than 50%  of this time was spent counseling and coordinating care related to the above assessment and plan.  Signed by: Altha Harm, PhD, NP-C

## 2022-04-13 NOTE — Assessment & Plan Note (Addendum)
Oncology consulted. We will continue patient on vancomycin and cefepime.

## 2022-04-13 NOTE — Sepsis Progress Note (Signed)
Following for sepsis monitoring ?

## 2022-04-13 NOTE — Progress Notes (Signed)
Pharmacy Antibiotic Note  Ariana Wilson is a 61 y.o. female admitted on 04/13/2022 with  febrile neutropenia .  Pharmacy has been consulted for Vanc, Cefepime dosing.  Plan: Cefepime 2 gm IV X 1 given in ED on 8/2 @ 1949. Cefepime 2 gm IV Q8H ordered to start on 8/3 @ 0400.   Vancomycin 1500 mg IV X 1 given in ED on 8/2 @ 2154. Vancomycin 1250 mg IV Q24H ordered to start on 8/3 @ 2200.  AUC = 555 Vanc trough = 13.8      Temp (24hrs), Avg:101.1 F (38.4 C), Min:100.4 F (38 C), Max:102 F (38.9 C)  Recent Labs  Lab 04/08/22 0911 04/13/22 1119 04/13/22 1919  WBC 2.8* 1.1* 1.1*  CREATININE 0.70 0.83 0.87  LATICACIDVEN  --   --  0.7    Estimated Creatinine Clearance: 59.2 mL/min (by C-G formula based on SCr of 0.87 mg/dL).    Allergies  Allergen Reactions   Sulfa Antibiotics Hives    Says her tongue swelled a bit   Sulfonamide Derivatives Swelling    Tongue swelling    Antimicrobials this admission:   >>    >>   Dose adjustments this admission:   Microbiology results:  BCx:   UCx:    Sputum:    MRSA PCR:   Thank you for allowing pharmacy to be a part of this patient's care.  Rudi Bunyard D 04/13/2022 10:08 PM

## 2022-04-13 NOTE — ED Triage Notes (Signed)
Pt presents via POV with complaints of fever. Pt is on treatment with cisplatin etoposide chemotherapy and was prescribed Levaquin for neutropenic fever that she was dx with yesterday. Denies SOB or CP.   Pt discussed with EDP Ellender Hose) see orders for intervention.

## 2022-04-13 NOTE — Assessment & Plan Note (Signed)
Hold aspirin. Protonix IV.

## 2022-04-13 NOTE — Patient Instructions (Addendum)
Take Levaquin 500 mg - Take 1 tablet (500 mg total) by mouth daily- Extend previous prescription for 10 days

## 2022-04-13 NOTE — Telephone Encounter (Signed)
I spoke with patient. Pt called Dr. B last evening with c/o of fevers. Patient was not able to get the Levaquin script last evening. She stated that it was late when the script was sent into the pharmacy.  She never received a notification that script was available and stated that Dr. B was having computer issues when he was attempting to e-scribe the med.  She will go get the prescription this morning. If [facility] didn't get the script, then she will call me back. She stated that she has taken ibuprofen for her fevers. Her covid-19 home test was negative. Apt provided for 11 am today for port lab/ smc/ possible fluids (if needed)

## 2022-04-13 NOTE — ED Notes (Signed)
Both sets of Blood cultures are in the lab

## 2022-04-13 NOTE — Progress Notes (Signed)
Patient called regarding fever of 102 @ home. Patient start Levaquin this morning. Patient [place] zero in the clinic today. Recommend patient go to the emergency room for the evaluation. Patient in agreement.   GB

## 2022-04-13 NOTE — Assessment & Plan Note (Signed)
Vitals:   04/13/22 1905 04/13/22 2054 04/13/22 2100 04/13/22 2115  BP: (!) 102/59 (!) 85/46 (!) 90/41 (!) 88/34   04/13/22 2130 04/13/22 2145 04/13/22 2200 04/13/22 2215  BP: (!) 96/42 (!) 100/46 (!) 87/42 (!) 98/45   04/13/22 2230 04/13/22 2245 04/13/22 2300  BP: (!) 89/43 (!) 81/41 (!) 91/42  Blood pressures have been consistently under 102 at the most. We will hold patient's home regimen of blood pressure meds. Admit patient to stepdown. Bedrest fall precautions. Continue with IV fluids. Low threshold for Levophed as needed.

## 2022-04-13 NOTE — Assessment & Plan Note (Signed)
Currently patient is in sinus rhythm. Secondary to hypotension however I will hold patient's diltiazem today. Home regimen with flecainide and metoprolol also held. Aspirin 325 we will hold secondary to anemia. Although I do not suspect hemorrhagic shock.

## 2022-04-13 NOTE — Telephone Encounter (Signed)
10 am - I called patient again- she will go for cxr prior to apt

## 2022-04-13 NOTE — Assessment & Plan Note (Addendum)
    Latest Ref Rng & Units 04/13/2022    7:19 PM 04/13/2022   11:19 AM 04/08/2022    9:11 AM  CBC  WBC 4.0 - 10.5 K/uL 1.1  1.1  2.8   Hemoglobin 12.0 - 15.0 g/dL 7.1  7.5  7.3   Hematocrit 36.0 - 46.0 % 22.4  23.2  22.7   Platelets 150 - 400 K/uL 171  191  143   Patient is asymptomatic. Meeting severe sepsis criteria. Oncology consult requested by a.m. message. We will defer to oncology for? GSF and additional recommendations. Lactic acid negative, procalcitonin negative. We will continue on broad-spectrum IV antibiotic regimen with pharmacy consult.

## 2022-04-13 NOTE — ED Provider Triage Note (Signed)
Emergency Medicine Provider Triage Evaluation Note  Ariana Wilson, a 61 y.o. female  was evaluated in triage.  Pt complains of neutropenic fever.  Patient presents to the ED at the advice of her provider at the cancer center, after persistent fevers.  Patient was evaluated today in the cancer center with a fluid bolus and a work-up that included basic labs and blood cultures.  She was discharged with Levaquin which she had a single dose of today.  She returns to the ED with persistent fevers, but denies any nausea, vomiting, chest pain, or shortness of breath.  Review of Systems  Positive: fever Negative: CP, SOB  Physical Exam  BP (!) 102/59   Pulse 100   Temp (!) 102 F (38.9 C) (Oral)   Resp 20   SpO2 99%  Gen:   Awake, no distress  NAD Resp:  Normal effort CTA MSK:   Moves extremities without difficulty  Other:    Medical Decision Making  Medically screening exam initiated at 7:21 PM.  Appropriate orders placed.  Ariana Wilson was informed that the remainder of the evaluation will be completed by another provider, this initial triage assessment does not replace that evaluation, and the importance of remaining in the ED until their evaluation is complete.  Cancer patient on current chemotherapy treatment, presents to the ED for treatment of neutropenic fevers.   Ariana Wilson, [place] 04/13/22 1923

## 2022-04-14 ENCOUNTER — Other Ambulatory Visit: Payer: Self-pay | Admitting: Oncology

## 2022-04-14 ENCOUNTER — Telehealth: Payer: Self-pay | Admitting: *Deleted

## 2022-04-14 DIAGNOSIS — D709 Neutropenia, unspecified: Secondary | ICD-10-CM | POA: Diagnosis not present

## 2022-04-14 DIAGNOSIS — R5081 Fever presenting with conditions classified elsewhere: Secondary | ICD-10-CM | POA: Diagnosis not present

## 2022-04-14 DIAGNOSIS — I959 Hypotension, unspecified: Secondary | ICD-10-CM

## 2022-04-14 LAB — BRAIN NATRIURETIC PEPTIDE: B Natriuretic Peptide: 164.4 pg/mL — ABNORMAL HIGH (ref 0.0–100.0)

## 2022-04-14 LAB — CBC WITH DIFFERENTIAL/PLATELET
Abs Immature Granulocytes: 0.01 10*3/uL (ref 0.00–0.07)
Basophils Absolute: 0 10*3/uL (ref 0.0–0.1)
Basophils Relative: 0 %
Eosinophils Absolute: 0 10*3/uL (ref 0.0–0.5)
Eosinophils Relative: 3 %
HCT: 21 % — ABNORMAL LOW (ref 36.0–46.0)
Hemoglobin: 6.6 g/dL — ABNORMAL LOW (ref 12.0–15.0)
Immature Granulocytes: 1 %
Lymphocytes Relative: 73 %
Lymphs Abs: 0.8 10*3/uL (ref 0.7–4.0)
MCH: 28.8 pg (ref 26.0–34.0)
MCHC: 31.4 g/dL (ref 30.0–36.0)
MCV: 91.7 fL (ref 80.0–100.0)
Monocytes Absolute: 0.2 10*3/uL (ref 0.1–1.0)
Monocytes Relative: 20 %
Neutro Abs: 0 10*3/uL — CL (ref 1.7–7.7)
Neutrophils Relative %: 3 %
Platelets: 168 10*3/uL (ref 150–400)
RBC: 2.29 MIL/uL — ABNORMAL LOW (ref 3.87–5.11)
RDW: 16 % — ABNORMAL HIGH (ref 11.5–15.5)
Smear Review: NORMAL
WBC: 1.1 10*3/uL — CL (ref 4.0–10.5)
nRBC: 0 % (ref 0.0–0.2)

## 2022-04-14 LAB — ABO/RH: ABO/RH(D): B POS

## 2022-04-14 LAB — URINE CULTURE

## 2022-04-14 LAB — PATHOLOGIST SMEAR REVIEW

## 2022-04-14 LAB — TROPONIN I (HIGH SENSITIVITY): Troponin I (High Sensitivity): 3 ng/L (ref <18)

## 2022-04-14 LAB — HEMOGLOBIN AND HEMATOCRIT, BLOOD
HCT: 22.9 % — ABNORMAL LOW (ref 36.0–46.0)
Hemoglobin: 7.5 g/dL — ABNORMAL LOW (ref 12.0–15.0)

## 2022-04-14 LAB — PREPARE RBC (CROSSMATCH)

## 2022-04-14 LAB — PROCALCITONIN: Procalcitonin: <0.1 ng/mL

## 2022-04-14 LAB — MRSA NEXT GEN BY PCR, NASAL: MRSA by PCR Next Gen: NOT DETECTED

## 2022-04-14 LAB — CORTISOL-AM, BLOOD: Cortisol - AM: 16.2 ug/dL (ref 6.7–22.6)

## 2022-04-14 LAB — GLUCOSE, CAPILLARY: Glucose-Capillary: 124 mg/dL — ABNORMAL HIGH (ref 70–99)

## 2022-04-14 LAB — HIV ANTIBODY (ROUTINE TESTING W REFLEX): HIV Screen 4th Generation wRfx: NONREACTIVE

## 2022-04-14 MED ORDER — ORAL CARE MOUTH RINSE: 15.0000 mL | OROMUCOSAL | Status: DC | PRN

## 2022-04-14 MED ORDER — SODIUM CHLORIDE 0.9 % IV BOLUS
1000.0000 mL | Freq: Once | INTRAVENOUS | Status: AC
  Administered 2022-04-14: 1000 mL via INTRAVENOUS

## 2022-04-14 MED ORDER — CHLORHEXIDINE GLUCONATE CLOTH 2 % EX PADS
6.0000 | MEDICATED_PAD | Freq: Every day | CUTANEOUS | Status: DC
  Administered 2022-04-14 – 2022-04-16 (×3): 6 via TOPICAL

## 2022-04-14 MED ORDER — ONDANSETRON HCL 4 MG/2ML IJ SOLN
4.0000 mg | Freq: Three times a day (TID) | INTRAMUSCULAR | Status: DC | PRN
Start: 2022-04-14 — End: 2022-04-17
  Administered 2022-04-14 – 2022-04-16 (×6): 4 mg via INTRAVENOUS
  Filled 2022-04-14 (×6): qty 2

## 2022-04-14 MED ORDER — SODIUM CHLORIDE 0.9% IV SOLUTION
Freq: Once | INTRAVENOUS | Status: AC
Start: 2022-04-14 — End: 2022-04-14

## 2022-04-14 MED ORDER — SENNOSIDES-DOCUSATE SODIUM 8.6-50 MG PO TABS
2.0000 | ORAL_TABLET | Freq: Every evening | ORAL | Status: DC | PRN
  Administered 2022-04-14 – 2022-04-17 (×3): 2 via ORAL
  Filled 2022-04-14 (×2): qty 2

## 2022-04-14 MED ORDER — ACETAMINOPHEN 325 MG PO TABS
650.0000 mg | ORAL_TABLET | Freq: Once | ORAL | Status: AC
  Administered 2022-04-14: 650 mg via ORAL
  Filled 2022-04-14: qty 2

## 2022-04-14 MED ORDER — TBO-FILGRASTIM 300 MCG/0.5ML ~~LOC~~ SOSY
300.0000 ug | PREFILLED_SYRINGE | Freq: Every day | SUBCUTANEOUS | Status: DC
  Administered 2022-04-14 – 2022-04-16 (×3): 300 ug via SUBCUTANEOUS
  Filled 2022-04-14 (×4): qty 0.5

## 2022-04-14 NOTE — Progress Notes (Signed)
..  Patient is receiving Replacement Medication. Medication: Tecentriq Manufacture: [employer] Approval Dates: Approved from 04/14/2022 until indefinitely. ID: [id number] Reason: Insurance Denial First DOS: 04/20/2022

## 2022-04-14 NOTE — Progress Notes (Signed)
Patient had small episode of 25 mls clear emesis. Requesting nausea medication. MD made aware. Order placed for PRN Zofran.

## 2022-04-14 NOTE — Assessment & Plan Note (Signed)
Vitals:   04/13/22 2200 04/13/22 2215 04/13/22 2230 04/13/22 2245  BP: (!) 87/42 (!) 98/45 (!) 89/43 (!) 81/41   04/13/22 2300 04/13/22 2330 04/13/22 2348 04/14/22 0000  BP: (!) 91/42 (!) 84/46 (!) 85/41 (!) 101/38   04/14/22 0030 04/14/22 0100 04/14/22 0117 04/14/22 0130  BP: (!) 87/41 (!) 73/33 (!) 96/42 95/74    Intake/Output Summary (Last 24 hours) at 04/14/2022 0152 Last data filed at 04/13/2022 2354 Gross per 24 hour  Intake 1358 ml  Output --  Net 1358 ml  Blood pressure 95/74, pulse 86, temperature 97.9 F (36.6 C), temperature source Oral, resp. rate 14, height '5\' 1"'$  (1.549 m), weight 73.3 kg, SpO2 100 %. we will continue to monitor.  Home bp meds held.  We will repeat additional LR bolus if bp drops again.

## 2022-04-14 NOTE — Consult Note (Signed)
Hematology/Oncology Consult note [facility] Telephone:([phone] [phone]  Patient Care Team: Rusty Aus, MD as PCP - General (Internal Medicine) Clent Jacks, RN as Oncology Nurse Navigator   Name of the patient: Ariana Wilson  [id number]  21-Jul-1961    Reason for consult: Neutropenic fever   Requesting physician: Dr. 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  She is s/p 1 cycle of cisplatin etoposide chemotherapy that was given on 03/30/2022.She is currently admitted for fever.  She reported a fever of 102 at home.  She was started on Levaquin and was also seen in symptom management clinic but she continued to have fever and therefore admitted to the hospital.  Presently patient is hemodynamically stable and has remained afebrile since admission.  Blood pressure which was in the 80s on admission has improved to the 100s to 120s.Blood cultures show no growth so far and procalcitonin levels remain less than 0.1 making severe sepsis less likely.She is currently on cefepime and vancomycin for neutropenic fever  She is sitting up in her chair and eating.  Reports swallowing has actually improved after 1 cycle of chemotherapy.  Still reports ongoing fatigue.  ECOG PS- 1  Pain scale- 0   Review of systems- Review of Systems  Constitutional:  Positive for malaise/fatigue. Negative for chills, fever and weight loss.  HENT:  Negative for congestion, ear discharge and nosebleeds.   Eyes:  Negative for blurred vision.  Respiratory:  Negative for cough, hemoptysis, sputum production, shortness of breath and wheezing.   Cardiovascular:  Negative for chest pain, palpitations, orthopnea and claudication.  Gastrointestinal:  Negative for abdominal pain, blood in stool, constipation, diarrhea, heartburn, melena, nausea  and vomiting.  Genitourinary:  Negative for dysuria, flank pain, frequency, hematuria and urgency.  Musculoskeletal:  Negative for back pain, joint pain and myalgias.  Skin:  Negative for rash.  Neurological:  Negative for dizziness, tingling, focal weakness, seizures, weakness and headaches.  Endo/Heme/Allergies:  Does not bruise/bleed easily.  Psychiatric/Behavioral:  Negative for depression and suicidal ideas. The patient does not have insomnia.     Allergies  Allergen Reactions   Sulfa Antibiotics Hives    Says her tongue swelled a bit   Sulfonamide Derivatives Swelling    Tongue swelling    Patient Active Problem List   Diagnosis Date Noted   Hypotension 04/14/2022   Febrile neutropenia ([place]) 04/13/2022   Iron deficiency anemia 03/31/2022   Malignant tumor of lower third of esophagus ([place]) 03/24/2022   Primary malignant neuroendocrine neoplasm of esophagus ([place]) 03/20/2022   Bursitis of hip 07/15/2020   History of 2019 novel coronavirus disease (COVID-19) 06/15/2020   Chronic vaginitis 01/06/2020   Acute cystitis 12/07/2018   Vertigo 12/07/2018   Developmental venous anomaly 12/06/2018   Encounter for anticoagulation discussion and counseling 01/29/2018   Essential hypertension 01/27/2018   B12 deficiency 07/04/2017   Tubular adenoma 07/04/2017   Morbid obesity ([place]) 10/04/2016   Primary osteoarthritis of hand 03/08/2016   Family history of coronary artery disease 02/29/2016   Anemia    Menopause    Hyperlipemia 03/07/2011   ATRIAL FIBRILLATION 02/14/2009   Palpitations 02/14/2009     Past Medical History:  Diagnosis Date   Anemia    Arthritis    Atrial fibrillation ([place])    B12 deficiency    Dysrhythmia    Hyperlipidemia  Hypothyroidism    Menopause    Palpitations    Hx of, 25 years ago   Vertigo      Past Surgical History:  Procedure Laterality Date   BILATERAL SALPINGOOPHORECTOMY  2008   DILATION AND CURETTAGE OF UTERUS  08/24/2006    ESOPHAGOGASTRODUODENOSCOPY (EGD) WITH PROPOFOL N/A 03/01/2022   Procedure: ESOPHAGOGASTRODUODENOSCOPY (EGD) WITH PROPOFOL;  Surgeon: Lesly Rubenstein, MD;  Location: [facility];  Service: Endoscopy;  Laterality: N/A;   incision tendon sheath for trigger finger Right    ring finger   LAPAROSCOPIC SUPRACERVICAL HYSTERECTOMY  2008   Dr. Ammie Dalton; with BSO   NOSE SURGERY  03/2008   PORTA CATH INSERTION N/A 03/21/2022   Procedure: PORTA CATH INSERTION;  Surgeon: Algernon Huxley, MD;  Location: [facility];  Service: Cardiovascular;  Laterality: N/A;    Social History   Socioeconomic History   Marital status: Married    Spouse name: Not on file   Number of children: Not on file   Years of education: Not on file   Highest education level: Not on file  Occupational History   Occupation: Full time  Tobacco Use   Smoking status: Never   Smokeless tobacco: Never  Vaping Use   Vaping Use: Never used  Substance and Sexual Activity   Alcohol use: Never   Drug use: Never   Sexual activity: Not Currently    Birth control/protection: Surgical    Comment: Hysterectomy  Other Topics Concern   Not on file  Social History Narrative   Married with 2 children   Gets regular exercise   Social Determinants of Health   Financial Resource Strain: Not on file  Food Insecurity: Not on file  Transportation Needs: Not on file  Physical Activity: Not on file  Stress: Not on file  Social Connections: Not on file  Intimate Partner Violence: Not on file     Family History  Problem Relation Age of Onset   Hyperlipidemia Mother    Hypertension Mother    Stroke Mother    Heart disease Father    Hyperlipidemia Father    Atrial fibrillation Father    Melanoma Father    Arrhythmia Sister        Atrial fibrillation   Heart disease Sister    Atrial fibrillation Sister    Heart attack Brother    Breast cancer Neg Hx      Current Facility-Administered Medications:    ceFEPIme  (MAXIPIME) 2 g in sodium chloride 0.9 % 100 mL IVPB, 2 g, Intravenous, Q8H, Harvest Dark, MD, Stopped at 04/14/22 1315   Chlorhexidine Gluconate Cloth 2 % PADS 6 each, 6 each, Topical, Q0600, Para Skeans, MD, 6 each at 04/14/22 0000   dexamethasone (DECADRON) tablet 1 mg, 1 mg, Oral, Daily, Florina Ou V, MD, 1 mg at 04/14/22 1104   ondansetron (ZOFRAN) injection 4 mg, 4 mg, Intravenous, Q8H PRN, Kurtis Bushman, Sahar, MD, 4 mg at 04/14/22 1103   Oral care mouth rinse, 15 mL, Mouth Rinse, PRN, Sharion Settler, NP   pantoprazole (PROTONIX) injection 40 mg, 40 mg, Intravenous, Q12H, Florina Ou V, MD, 40 mg at 04/14/22 1103   Tbo-Filgrastim (GRANIX) injection 300 mcg, 300 mcg, Subcutaneous, Daily, Sindy Guadeloupe, MD, 300 mcg at 04/14/22 1158   vancomycin (VANCOREADY) IVPB 1250 mg/250 mL, 1,250 mg, Intravenous, Q24H, Harvest Dark, MD   Physical exam:  Vitals:   04/14/22 1630 04/14/22 1700 04/14/22 1725 04/14/22 1735  BP: (!) 104/51 [name])  111/41 (!) 110/53 (!) 112/48  Pulse: 84 80 90 94  Resp: 15 (!) '4 18 13  '$ Temp:      TempSrc:      SpO2: 100% 100% 100% 100%  Weight:      Height:       Physical Exam Constitutional:      General: She is not in acute distress. HENT:     Mouth/Throat:     Mouth: Mucous membranes are moist.     Pharynx: Oropharynx is clear.  Cardiovascular:     Rate and Rhythm: Normal rate and regular rhythm.     Heart sounds: Normal heart sounds.  Pulmonary:     Effort: Pulmonary effort is normal.     Breath sounds: Normal breath sounds.  Abdominal:     General: Bowel sounds are normal.     Palpations: Abdomen is soft.  Skin:    General: Skin is warm and dry.  Neurological:     Mental Status: She is alert and oriented to person, place, and time.           Latest Ref Rng & Units 04/13/2022    7:19 PM  CMP  Glucose 70 - 99 mg/dL 115   BUN 8 - 23 mg/dL 14   Creatinine 0.44 - 1.00 mg/dL 0.87   Sodium 135 - 145 mmol/L 134   Potassium 3.5 - 5.1 mmol/L 3.9    Chloride 98 - 111 mmol/L 104   CO2 22 - 32 mmol/L 24   Calcium 8.9 - 10.3 mg/dL 8.0   Total Protein 6.5 - 8.1 g/dL 6.4   Total Bilirubin 0.3 - 1.2 mg/dL 0.5   Alkaline Phos 38 - 126 U/L 65   AST 15 - 41 U/L 11   ALT 0 - 44 U/L 10       Latest Ref Rng & Units 04/14/2022    6:17 AM  CBC  WBC 4.0 - 10.5 K/uL 1.1   Hemoglobin 12.0 - 15.0 g/dL 6.6   Hematocrit 36.0 - 46.0 % 21.0   Platelets 150 - 400 K/uL 168     '@IMAGES'$ @  CT Angio Chest Pulmonary Embolism (PE) W or WO Contrast  Result Date: 04/13/2022 CLINICAL DATA:  Pulmonary embolism (PE) suspected, high prob rales on exam bl. Neutropenic fever. History of esophageal cancer, active chemotherapy. EXAM: CT ANGIOGRAPHY CHEST WITH CONTRAST TECHNIQUE: Multidetector CT imaging of the chest was performed using the standard protocol during bolus administration of intravenous contrast. Multiplanar CT image reconstructions and MIPs were obtained to evaluate the vascular anatomy. RADIATION DOSE REDUCTION: This exam was performed according to the departmental dose-optimization program which includes automated exposure control, adjustment of the mA and/or kV according to patient size and/or use of iterative reconstruction technique. CONTRAST:  72m OMNIPAQUE IOHEXOL 350 MG/ML SOLN COMPARISON:  Chest radiograph earlier today. CT 03/17/2022. PET CT 03/22/2022 FINDINGS: Cardiovascular: There are no filling defects within the pulmonary arteries to suggest pulmonary embolus. Mild aortic atherosclerosis, no aneurysm. There are coronary artery calcifications. Heart is normal in size. No pericardial effusion. Mediastinum/Nodes: There is wall thickening of the distal esophagus extending to the gastroesophageal junction consistent with known malignancy. Mild adjacent paraesophageal fat stranding. No paraesophageal collection or pneumomediastinum. Upper esophagus is minimally patulous. The paraesophageal lymph node on prior exam is difficult to delineate from the  esophageal wall thickening currently. Left upper paratracheal no new the thoracic inlet measures 14 mm, series 5, image 28, this previously measured 10 mm. 14 mm left upper paratracheal  node series 5, image 52, previously 8 mm. There is a 13 mm right supraclavicular node, series 5, image 46, previously 11 mm. There are increased number of right upper paratracheal nodes from prior exam, that are subcentimeter. Lungs/Pleura: Mild breathing motion artifact limits detailed parenchymal assessment. Scattered areas of bandlike atelectasis. Heterogeneous pulmonary parenchyma. No confluent consolidation. No visible pulmonary nodule. No pleural effusion. Upper Abdomen: No acute findings. Musculoskeletal: There are no acute or suspicious osseous abnormalities. No chest wall soft tissue abnormalities. Review of the MIP images confirms the above findings. IMPRESSION: 1. No pulmonary embolus. 2. Heterogeneous pulmonary parenchyma may represent small airways disease. Scattered areas of bandlike atelectasis. No evidence of pneumonia. 3. Distal esophageal wall thickening extending to the gastroesophageal junction consistent with known malignancy. There is mild adjacent paraesophageal fat stranding. 4. Mediastinal adenopathy with slight increase size of multiple lymph nodes from prior exam. Attention on subsequent staging exams recommended. Aortic Atherosclerosis (ICD10-I70.0). Electronically Signed   By: Keith Rake M.D.   On: 04/13/2022 23:25   DG Chest 2 View  Result Date: 04/13/2022 CLINICAL DATA:  Neutropenic fever. EXAM: CHEST - 2 VIEW COMPARISON:  10/30/2013 FINDINGS: Porta catheter on the right with tip at the SVC. Normal heart size and mediastinal contours. There is no edema, consolidation, effusion, or pneumothorax. IMPRESSION: Negative for pneumonia. Electronically Signed   By: Jorje Guild M.D.   On: 04/13/2022 11:00   MR Brain W Wo Contrast  Result Date: 04/03/2022 CLINICAL DATA:  Esophageal cancer,  intracranial staging EXAM: MRI HEAD WITHOUT AND WITH CONTRAST TECHNIQUE: Multiplanar, multiecho pulse sequences of the brain and surrounding structures were obtained without and with intravenous contrast. CONTRAST:  7.53m GADAVIST GADOBUTROL 1 MMOL/ML IV SOLN COMPARISON:  03/22/2022 PET-CT FINDINGS: Brain: No enhancement or swelling to suggest metastatic disease. Irregularly-shaped area of T2 hyperintensity in the right cerebral white matter with underlying developmental venous anomaly. No acute hemorrhage or swelling superimposed. No acute infarct, hydrocephalus, or collection. Vascular: Major flow voids and vascular enhancements are preserved Skull and upper cervical spine: Negative for marrow lesion. Sinuses/Orbits: Negative IMPRESSION: 1. Negative for metastatic disease. 2. 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 Electronically Signed   By: JJorje GuildM.D.   On: 04/03/2022 08:24   NM PET Image Initial (PI) Skull Base To Thigh  Result Date: 03/22/2022 CLINICAL DATA:  Initial treatment strategy for esophageal carcinoma. EXAM: NUCLEAR MEDICINE PET SKULL BASE TO THIGH TECHNIQUE: 8.6 mCi F-18 FDG was injected intravenously. Full-ring PET imaging was performed from the skull base to thigh after the radiotracer. CT data was obtained and used for attenuation correction and anatomic localization. Fasting blood glucose: 109 mg/dl COMPARISON:  CT on 03/17/2022 FINDINGS: Mediastinal blood-pool activity (background): SUV max = 2.2 Liver activity (reference): SUV max = N/A NECK: 1.1 cm left level 4 lower jugular lymph node on image 577/8is hypermetabolic, with SUV max of 5.3. 1.1 cm right supraclavicular lymph node on image 624/2is hypermetabolic, with SUV max of 5.1. Incidental CT findings:  None. CHEST: Concentric hypermetabolic wall thickening is seen involving the distal thoracic esophagus and proximal  stomach,, with SUV max of 11.0, consistent with known esophageal carcinoma. A 1 cm left paraesophageal lymph node is seen on image 111/2, which is hypermetabolic with SUV max of 3.9. No other hypermetabolic lymphadenopathy seen within the thorax. No suspicious pulmonary nodules seen on CT images. Incidental CT findings:  None. ABDOMEN/PELVIS: No abnormal hypermetabolic  activity within the liver, pancreas, adrenal glands, or spleen. Mild hypermetabolic lymphadenopathy seen in the gastrohepatic ligament, which shows SUV max of 6.8. 11 mm lymph node in porta hepatis is also hypermetabolic, with SUV max of 5.9. 1.8 cm left paraaortic lymph node is hypermetabolic, with SUV max of 7.9. No hypermetabolic lymph nodes seen within the pelvis. Incidental CT findings:  None. SKELETON: No focal hypermetabolic bone lesions to suggest skeletal metastasis. Incidental CT findings:  None. IMPRESSION: Hypermetabolic wall thickening involving the distal thoracic esophagus and proximal stomach, consistent with known esophageal carcinoma. Hypermetabolic lymphadenopathy in the neck, chest, and abdomen as described above, consistent with metastatic disease. Electronically Signed   By: Marlaine Hind M.D.   On: 03/22/2022 13:50   PERIPHERAL VASCULAR CATHETERIZATION  Result Date: 03/21/2022 See surgical note for result.  CT CHEST ABDOMEN PELVIS W CONTRAST  Result Date: 03/17/2022 CLINICAL DATA:  Esophageal cancer, staging. EXAM: CT CHEST, ABDOMEN, AND PELVIS WITH CONTRAST TECHNIQUE: Multidetector CT imaging of the chest, abdomen and pelvis was performed following the standard protocol during bolus administration of intravenous contrast. RADIATION DOSE REDUCTION: This exam was performed according to the departmental dose-optimization program which includes automated exposure control, adjustment of the mA and/or kV according to patient size and/or use of iterative reconstruction technique. CONTRAST:  149m OMNIPAQUE IOHEXOL 300 MG/ML   SOLN COMPARISON:  CT November 12, 2010 FINDINGS: CT CHEST FINDINGS Cardiovascular: Aortic atherosclerosis without thoracic aortic aneurysm. No central pulmonary embolus on this nondedicated study. Coronary artery calcifications. No significant pericardial effusion/thickening. Normal size heart. Mediastinum/Nodes: No suspicious thyroid nodule. Prominent/enlarged paraesophageal and paratracheal mediastinal lymph nodes. For reference: -lobular left upper paratracheal lymph node near the thoracic inlet measures 11 mm in short axis on image 9/2. -left lower paraesophageal lymph node near the hiatus measures 11 mm in short axis on image 42/2. Asymmetric thickening of the distal esophagus measures proximally 5.0 cm in length on coronal image 81/4 and 3.2 x 2.4 cm in maximum axial dimension on image 44/2 with the asymmetric thickening extending along the lesser curvature of the stomach for instance on axial image 51/2. No pathologically enlarged hilar or axillary lymph nodes. Lungs/Pleura: No suspicious pulmonary nodules or masses. No focal airspace consolidation. No pleural effusion. No pneumothorax. Musculoskeletal: No chest wall mass or suspicious bone lesions identified. Thoracic spondylosis. CT ABDOMEN PELVIS FINDINGS Hepatobiliary: No suspicious hepatic lesion. Gallbladder is unremarkable. No biliary ductal dilation. Pancreas: No pancreatic ductal dilation or evidence of acute inflammation. Spleen: No splenomegaly or focal splenic lesion. Adrenals/Urinary Tract: Bilateral adrenal glands appear normal. No hydronephrosis. No suspicious renal mass. Urinary bladder is unremarkable for degree of degree of distension. Stomach/Bowel: Radiopaque enteric contrast material traverses distal loops of small bowel. Stomach is distended with ingested material, contrast and gas with some asymmetric wall thickening extending from the distal esophageal mass along the cardia/fundus along the lesser curvature of the stomach. No pathologic  dilation of small or large bowel. Moderate volume of formed stool throughout the colon suggestive of constipation. Vascular/Lymphatic: Gastrohepatic and retroperitoneal adenopathy to the level of the renal veins. For reference: -gastrohepatic lymph node measures 13 mm in short axis on image 52/2 -lobular left periaortic lymph node at the level of the left renal hilum measures 17 mm in short axis on image 66/2. No pathologically enlarged pelvic lymph nodes. Aortic atherosclerosis without aneurysmal dilation. Reproductive: Status post hysterectomy. No adnexal masses. Other: No significant abdominopelvic free fluid. No discrete peritoneal or omental nodularity. Musculoskeletal: No aggressive lytic or blastic lesion of  bone. IMPRESSION: 1. Asymmetric distal esophageal wall thickening, consistent with patient's known esophageal neoplasm. With the thickening appearing to extend along the lesser curvature of the stomach through the gastric cardia and proximal fundus with at least a portion of the thickening related to underdistention of the stomach. 2. Paratracheal/paraesophageal, gastrohepatic and retroperitoneal adenopathy, is most consistent with nodal disease involvement. 3. No evidence of solid organ or osseous metastatic disease in the chest, abdomen or pelvis. 4.  Aortic Atherosclerosis (ICD10-I70.0). Electronically Signed   By: Dahlia Bailiff M.D.   On: 03/17/2022 17:02    Assessment and plan- Patient is a 61 y.o. 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 CT angio chest was negative for PE small airway disease noted on CT but no evidence of pneumonia.  Would recommend continuing IV antibiotics at this time and if she remains afebrile for 48 hours she could be discharged on oral  antibiotics like Levaquin.  She has been started on Neupogen today and will continue while she is in the hospital.  If she gets discharged and [place] remains less than 1.5 we will continue Neupogen as an outpatient.  However this cannot be done over the weekend.  I do expect her counts to improve over the next 2 to 3 days.  Platelet counts are normal.  She had baseline anemia even prior to starting chemotherapy.  Her hemoglobin is down to 6.6 today and it would be okay to transfuse her will PRBCs to keep hemoglobin more than 7.  Irradiated blood products only.  She is scheduled to receive chemotherapy next week but that will be on hold until acute issues resolve and white cell count improves.  She will be receiving growth factor support with subsequent chemo treatments given neutropenic fever with cycle 1     Visit Diagnosis 1. Neutropenic fever ([place])   2. Febrile neutropenia ([facility])     Dr. Randa Evens, MD, MPH [facility] at [facility] [phone] 04/14/2022

## 2022-04-14 NOTE — Progress Notes (Signed)
PROGRESS NOTE    Ariana Wilson  HYQ:[id number] DOB: [birth year] DOA: 04/13/2022 PCP: Rusty Aus, MD    Brief Narrative:  Ariana Wilson is a 61 y.o. female seen in ed with complaints of fever referred by oncologist for [facility] of 0.  Patient initially called his oncologist when he had a fever of 100.3 along with myalgias and joint pain Levaquin was called and with follow-up appointment scheduled.  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  Patient being followed by oncology team [facility] with Dr. Janese Banks.  Patient has received cisplatin etoposide chemotherapy  8/3 hemoglobin 6.6 discussed with Dr. Janese Banks about transfusion recommended irradiated PRBC.  Patient is agreeable to transfusion.  She reports  had transfusion in the past without any issues.  Consultants:  Hematology  Procedures:   Antimicrobials:  Cefepime Vancomycin   Subjective: Feels better.  No sob or cp  Objective: Vitals:   04/14/22 1155 04/14/22 1200 04/14/22 1300 04/14/22 1413  BP:  (!) 107/58    Pulse: (!) 106 (!) 104 97 89  Resp: '16 18  14  '$ Temp: 99.7 F (37.6 C)   99.3 F (37.4 C)  TempSrc: Oral   Oral  SpO2: 100% 99% 99% 99%  Weight:      Height:        Intake/Output Summary (Last 24 hours) at 04/14/2022 1513 Last data filed at 04/14/2022 1200 Gross per 24 hour  Intake 3420.51 ml  Output 25 ml  Net 3395.51 ml   Filed Weights   04/14/22 0024  Weight: 73.3 kg    Examination: Calm, NAD, eating breakfast, pale Cta no w/r Reg s1/s2 no gallop Soft benign +bs No edema Aaoxox3  Mood and affect appropriate in current setting    Data Reviewed: I have personally reviewed following labs and imaging studies  CBC: Recent Labs  Lab 04/08/22 0911 04/13/22 1119  04/13/22 1919 04/14/22 0617  WBC 2.8* 1.1* 1.1* 1.1*  NEUTROABS 1.1* 0.0* 0.1* 0.0*  HGB 7.3* 7.5* 7.1* 6.6*  HCT 22.7* 23.2* 22.4* 21.0*  MCV 87.6 90.6 91.4 91.7  PLT 143* 191 171 952   Basic Metabolic Panel: Recent Labs  Lab 04/08/22 0911 04/13/22 1119 04/13/22 1919  NA 135 134* 134*  K 4.5 3.8 3.9  CL 105 103 104  CO2 '25 24 24  '$ GLUCOSE 98 99 115*  BUN '11 13 14  '$ CREATININE 0.70 0.83 0.87  CALCIUM 8.6* 8.4* 8.0*  MG  --  2.3  --    GFR: Estimated Creatinine Clearance: 62.2 mL/min (by C-G formula based on SCr of 0.87 mg/dL). Liver Function Tests: Recent Labs  Lab 04/13/22 1119 04/13/22 1919  AST 11* 11*  ALT 11 10  ALKPHOS 64 65  BILITOT 0.6 0.5  PROT 6.7 6.4*  ALBUMIN 3.5 3.4*   No results for input(s): "LIPASE", "AMYLASE" in the last 168 hours. No results for input(s): "AMMONIA" in the last 168 hours. Coagulation Profile: No results for input(s): "INR", "PROTIME" in the last 168 hours. Cardiac Enzymes: No results for input(s): "CKTOTAL", "CKMB", "CKMBINDEX", "TROPONINI" in the last 168 hours. BNP (last 3 results) No results for input(s): "PROBNP" in the last 8760 hours. HbA1C: No results for input(s): "HGBA1C" in the last 72  hours. CBG: Recent Labs  Lab 04/14/22 0023  GLUCAP 124*   Lipid Profile: No results for input(s): "CHOL", "HDL", "LDLCALC", "TRIG", "CHOLHDL", "LDLDIRECT" in the last 72 hours. Thyroid Function Tests: No results for input(s): "TSH", "T4TOTAL", "FREET4", "T3FREE", "THYROIDAB" in the last 72 hours. Anemia Panel: No results for input(s): "VITAMINB12", "FOLATE", "FERRITIN", "TIBC", "IRON", "RETICCTPCT" in the last 72 hours. Sepsis Labs: Recent Labs  Lab 04/13/22 1919 04/13/22 2109 04/14/22 0311  PROCALCITON <0.10  --  <0.10  LATICACIDVEN 0.7 1.0  --     Recent Results (from the past 240 hour(s))  Urine culture     Status: Abnormal   Collection Time: 04/13/22 11:33 AM   Specimen: Urine, Random  Result Value Ref Range Status    Specimen Description   Final    URINE, RANDOM Performed at [facility], [address] [place], [place] [address]    Special Requests   Final    NONE Performed at [facility], [address] [place], [place] [address]    Culture MULTIPLE SPECIES PRESENT, SUGGEST RECOLLECTION (A)  Final   Report Status 04/14/2022 FINAL  Final  Blood culture (routine x 2)     Status: None (Preliminary result)   Collection Time: 04/13/22  7:18 PM   Specimen: Right Antecubital; Blood  Result Value Ref Range Status   Specimen Description RIGHT ANTECUBITAL  Final   Special Requests   Final    BOTTLES DRAWN AEROBIC AND ANAEROBIC Blood Culture results may not be optimal due to an excessive volume of blood received in culture bottles   Culture   Final    NO GROWTH < 12 HOURS Performed at [facility], [address], [place], [place] [address]    Report Status PENDING  Incomplete  Blood culture (routine x 2)     Status: None (Preliminary result)   Collection Time: 04/13/22  7:28 PM   Specimen: BLOOD  Result Value Ref Range Status   Specimen Description BLOOD LEFT ANTECUBITAL  Final   Special Requests   Final    BOTTLES DRAWN AEROBIC AND ANAEROBIC Blood Culture adequate volume   Culture   Final    NO GROWTH < 12 HOURS Performed at [facility], [address], [place], [place] [address]    Report Status PENDING  Incomplete  Resp Panel by RT-PCR (Flu A&B, Covid) Anterior Nasal Swab     Status: None   Collection Time: 04/13/22  8:52 PM   Specimen: Anterior Nasal Swab  Result Value Ref Range Status   SARS Coronavirus 2 by RT PCR NEGATIVE NEGATIVE Final    Comment: (NOTE) SARS-CoV-2 target nucleic acids are NOT DETECTED.  The SARS-CoV-2 RNA is generally detectable in upper respiratory specimens during the acute phase of infection. The lowest concentration of SARS-CoV-2 viral copies this assay can detect is 138 copies/mL. A negative result does not preclude  SARS-Cov-2 infection and should not be used as the sole basis for treatment or other patient management decisions. A negative result may occur with  improper specimen collection/handling, submission of specimen other than nasopharyngeal swab, presence of viral mutation(s) within the areas targeted by this assay, and inadequate number of viral copies(<138 copies/mL). A negative result must be combined with clinical observations, patient history, and epidemiological information. The expected result is Negative.  Fact Sheet for Patients:  EntrepreneurPulse.com.au  Fact Sheet for Healthcare Providers:  IncredibleEmployment.be  This test is no t yet approved or cleared by the [place] and  has been authorized for detection and/or diagnosis of SARS-CoV-2 by FDA under an Emergency Use Authorization (EUA). This EUA will remain  in effect (meaning this test can be used) for the duration of the COVID-19 declaration under Section 564(b)(1) of the Act, 21 U.S.C.section 360bbb-3(b)(1), unless the authorization is terminated  or revoked sooner.       Influenza A by PCR NEGATIVE NEGATIVE Final   Influenza B by PCR NEGATIVE NEGATIVE Final    Comment: (NOTE) The Xpert Xpress SARS-CoV-2/FLU/RSV plus assay is intended as an aid in the diagnosis of influenza from Nasopharyngeal swab specimens and should not be used as a sole basis for treatment. Nasal washings and aspirates are unacceptable for Xpert Xpress SARS-CoV-2/FLU/RSV testing.  Fact Sheet for Patients: EntrepreneurPulse.com.au  Fact Sheet for Healthcare Providers: IncredibleEmployment.be  This test is not yet approved or cleared by the [place] FDA and has been authorized for detection and/or diagnosis of SARS-CoV-2 by FDA under an Emergency Use Authorization (EUA). This EUA will remain in effect (meaning this test can be used) for the duration of  the COVID-19 declaration under Section 564(b)(1) of the Act, 21 U.S.C. section 360bbb-3(b)(1), unless the authorization is terminated or revoked.  Performed at [facility], [address] [place], [place] [address]   MRSA Next Gen by PCR, Nasal     Status: None   Collection Time: 04/14/22 12:22 AM   Specimen: Nasal Mucosa; Nasal Swab  Result Value Ref Range Status   MRSA by PCR Next Gen NOT DETECTED NOT DETECTED Final    Comment: (NOTE) The GeneXpert MRSA Assay (FDA approved for NASAL specimens only), is one component of a comprehensive MRSA colonization surveillance program. It is not intended to diagnose MRSA infection nor to guide or monitor treatment for MRSA infections. Test performance is not FDA approved in patients less than 16 years old. Performed at [facility], [address], [place], [place] [address]          Radiology Studies: CT Angio Chest Pulmonary Embolism (PE) W or WO Contrast  Result Date: 04/13/2022 CLINICAL DATA:  Pulmonary embolism (PE) suspected, high prob rales on exam bl. Neutropenic fever. History of esophageal cancer, active chemotherapy. EXAM: CT ANGIOGRAPHY CHEST WITH CONTRAST TECHNIQUE: Multidetector CT imaging of the chest was performed using the standard protocol during bolus administration of intravenous contrast. Multiplanar CT image reconstructions and MIPs were obtained to evaluate the vascular anatomy. RADIATION DOSE REDUCTION: This exam was performed according to the departmental dose-optimization program which includes automated exposure control, adjustment of the mA and/or kV according to patient size and/or use of iterative reconstruction technique. CONTRAST:  83m OMNIPAQUE IOHEXOL 350 MG/ML SOLN COMPARISON:  Chest radiograph earlier today. CT 03/17/2022. PET CT 03/22/2022 FINDINGS: Cardiovascular: There are no filling defects within the pulmonary arteries to suggest pulmonary embolus. Mild aortic atherosclerosis, no  aneurysm. There are coronary artery calcifications. Heart is normal in size. No pericardial effusion. Mediastinum/Nodes: There is wall thickening of the distal esophagus extending to the gastroesophageal junction consistent with known malignancy. Mild adjacent paraesophageal fat stranding. No paraesophageal collection or pneumomediastinum. Upper esophagus is minimally patulous. The paraesophageal lymph node on prior exam is difficult to delineate from the esophageal wall thickening currently. Left upper paratracheal no new the thoracic inlet measures 14 mm, series 5, image 28, this previously measured 10 mm. 14 mm left upper paratracheal node series 5, image 52, previously 8 mm. There is a 13 mm right supraclavicular node, series 5, image 46, previously 11  mm. There are increased number of right upper paratracheal nodes from prior exam, that are subcentimeter. Lungs/Pleura: Mild breathing motion artifact limits detailed parenchymal assessment. Scattered areas of bandlike atelectasis. Heterogeneous pulmonary parenchyma. No confluent consolidation. No visible pulmonary nodule. No pleural effusion. Upper Abdomen: No acute findings. Musculoskeletal: There are no acute or suspicious osseous abnormalities. No chest wall soft tissue abnormalities. Review of the MIP images confirms the above findings. IMPRESSION: 1. No pulmonary embolus. 2. Heterogeneous pulmonary parenchyma may represent small airways disease. Scattered areas of bandlike atelectasis. No evidence of pneumonia. 3. Distal esophageal wall thickening extending to the gastroesophageal junction consistent with known malignancy. There is mild adjacent paraesophageal fat stranding. 4. Mediastinal adenopathy with slight increase size of multiple lymph nodes from prior exam. Attention on subsequent staging exams recommended. Aortic Atherosclerosis (ICD10-I70.0). Electronically Signed   By: Keith Rake M.D.   On: 04/13/2022 23:25   DG Chest 2 View  Result  Date: 04/13/2022 CLINICAL DATA:  Neutropenic fever. EXAM: CHEST - 2 VIEW COMPARISON:  10/30/2013 FINDINGS: Porta catheter on the right with tip at the SVC. Normal heart size and mediastinal contours. There is no edema, consolidation, effusion, or pneumothorax. IMPRESSION: Negative for pneumonia. Electronically Signed   By: Jorje Guild M.D.   On: 04/13/2022 11:00        Scheduled Meds:  sodium chloride   Intravenous Once   Chlorhexidine Gluconate Cloth  6 each Topical Q0600   dexamethasone  1 mg Oral Daily   pantoprazole (PROTONIX) IV  40 mg Intravenous Q12H   Tbo-Filgrastim  300 mcg Subcutaneous Daily   Continuous Infusions:  ceFEPime (MAXIPIME) IV 2 g (04/14/22 1245)   lactated ringers 150 mL/hr at 04/14/22 1202   vancomycin      Assessment & Plan:   Principal Problem:   Febrile neutropenia (HCC) Active Problems:   ATRIAL FIBRILLATION   Anemia   Essential hypertension   Primary malignant neuroendocrine neoplasm of esophagus (HCC)   Hypotension   Neutropenic fever Chest x-ray negative Urine culture with multiple species Blood cultures pending We will continue empiric IV antibiotics   Hypotension On IV fluids Hold BP meds Receiving 1 unit of blood  Anemia Neutropenia Oncology following Started on Neupogen Transfusing 1 unit PRBC irradiated No reports of bleed   PAF Currently in sinus rhythm Holding BP meds Holding aspirin due to anemia  DVT prophylaxis: scd Code Status:full Family Communication: Husband at bedside  Disposition Plan:  Status is: Inpatient Remains inpatient appropriate because: IV treatment.  Getting transfusion.        LOS: 1 day   Time spent: 35 minutes    Nolberto Hanlon, MD [facility] Pager [phone]  If 7PM-7AM, please contact night-coverage 04/14/2022, 3:13 PM

## 2022-04-14 NOTE — Telephone Encounter (Signed)
Reviewed chart s/p symptom mgmt apt. Pt was admitted for neutropenic fever yesterday due to increase in fevers. Sent msg to oncology team.

## 2022-04-15 ENCOUNTER — Inpatient Hospital Stay: Payer: BC Managed Care – PPO

## 2022-04-15 ENCOUNTER — Inpatient Hospital Stay: Payer: BC Managed Care – PPO | Admitting: Hospice and Palliative Medicine

## 2022-04-15 DIAGNOSIS — D709 Neutropenia, unspecified: Secondary | ICD-10-CM | POA: Diagnosis not present

## 2022-04-15 DIAGNOSIS — R5081 Fever presenting with conditions classified elsewhere: Secondary | ICD-10-CM | POA: Diagnosis not present

## 2022-04-15 LAB — CBC WITH DIFFERENTIAL/PLATELET
Abs Immature Granulocytes: 0.13 10*3/uL — ABNORMAL HIGH (ref 0.00–0.07)
Basophils Absolute: 0 10*3/uL (ref 0.0–0.1)
Basophils Relative: 1 %
Eosinophils Absolute: 0 10*3/uL (ref 0.0–0.5)
Eosinophils Relative: 1 %
HCT: 23.3 % — ABNORMAL LOW (ref 36.0–46.0)
Hemoglobin: 7.7 g/dL — ABNORMAL LOW (ref 12.0–15.0)
Immature Granulocytes: 5 %
Lymphocytes Relative: 56 %
Lymphs Abs: 1.6 10*3/uL (ref 0.7–4.0)
MCH: 28.5 pg (ref 26.0–34.0)
MCHC: 33 g/dL (ref 30.0–36.0)
MCV: 86.3 fL (ref 80.0–100.0)
Monocytes Absolute: 0.5 10*3/uL (ref 0.1–1.0)
Monocytes Relative: 18 %
Neutro Abs: 0.5 10*3/uL — ABNORMAL LOW (ref 1.7–7.7)
Neutrophils Relative %: 19 %
Platelets: 231 10*3/uL (ref 150–400)
RBC: 2.7 MIL/uL — ABNORMAL LOW (ref 3.87–5.11)
RDW: 15.6 % — ABNORMAL HIGH (ref 11.5–15.5)
Smear Review: ADEQUATE
WBC: 2.8 10*3/uL — ABNORMAL LOW (ref 4.0–10.5)
nRBC: 6.5 % — ABNORMAL HIGH (ref 0.0–0.2)

## 2022-04-15 LAB — COMPREHENSIVE METABOLIC PANEL
ALT: 11 U/L (ref 0–44)
AST: 15 U/L (ref 15–41)
Albumin: 2.6 g/dL — ABNORMAL LOW (ref 3.5–5.0)
Alkaline Phosphatase: 54 U/L (ref 38–126)
Anion gap: 8 (ref 5–15)
BUN: 11 mg/dL (ref 8–23)
CO2: 22 mmol/L (ref 22–32)
Calcium: 8.2 mg/dL — ABNORMAL LOW (ref 8.9–10.3)
Chloride: 107 mmol/L (ref 98–111)
Creatinine, Ser: 0.61 mg/dL (ref 0.44–1.00)
GFR, Estimated: >60 mL/min (ref >60)
Glucose, Bld: 96 mg/dL (ref 70–99)
Potassium: 3.2 mmol/L — ABNORMAL LOW (ref 3.5–5.1)
Sodium: 137 mmol/L (ref 135–145)
Total Bilirubin: 1.7 mg/dL — ABNORMAL HIGH (ref 0.3–1.2)
Total Protein: 5.8 g/dL — ABNORMAL LOW (ref 6.5–8.1)

## 2022-04-15 LAB — BPAM RBC
Blood Product Expiration Date: 202308092359
ISSUE DATE / TIME: 202308031603
Unit Type and Rh: 1700

## 2022-04-15 LAB — TYPE AND SCREEN
ABO/RH(D): B POS
Antibody Screen: NEGATIVE
Unit division: 0

## 2022-04-15 LAB — MAGNESIUM: Magnesium: 1.7 mg/dL (ref 1.7–2.4)

## 2022-04-15 LAB — PHOSPHORUS: Phosphorus: 2.7 mg/dL (ref 2.5–4.6)

## 2022-04-15 LAB — PROCALCITONIN: Procalcitonin: <0.1 ng/mL

## 2022-04-15 MED ORDER — METOPROLOL TARTRATE 25 MG PO TABS
12.5000 mg | ORAL_TABLET | Freq: Once | ORAL | Status: AC
  Administered 2022-04-15: 12.5 mg via ORAL
  Filled 2022-04-15: qty 1

## 2022-04-15 MED ORDER — FLECAINIDE ACETATE 50 MG PO TABS
50.0000 mg | ORAL_TABLET | Freq: Two times a day (BID) | ORAL | Status: DC
  Administered 2022-04-15 – 2022-04-17 (×5): 50 mg via ORAL
  Filled 2022-04-15 (×6): qty 1

## 2022-04-15 MED ORDER — MIDODRINE HCL 5 MG PO TABS
5.0000 mg | ORAL_TABLET | Freq: Three times a day (TID) | ORAL | Status: DC
  Administered 2022-04-15 – 2022-04-16 (×2): 5 mg via ORAL
  Filled 2022-04-15 (×2): qty 1

## 2022-04-15 MED ORDER — METOPROLOL TARTRATE 25 MG PO TABS
25.0000 mg | ORAL_TABLET | Freq: Four times a day (QID) | ORAL | Status: DC
  Administered 2022-04-15: 25 mg via ORAL
  Filled 2022-04-15 (×4): qty 1

## 2022-04-15 MED ORDER — MELATONIN 5 MG PO TABS
5.0000 mg | ORAL_TABLET | Freq: Every day | ORAL | Status: DC
  Administered 2022-04-15 – 2022-04-16 (×2): 5 mg via ORAL
  Filled 2022-04-15 (×2): qty 1

## 2022-04-15 MED ORDER — POLYETHYLENE GLYCOL 3350 17 G PO PACK
17.0000 g | PACK | Freq: Every day | ORAL | Status: DC | PRN
  Administered 2022-04-16 – 2022-04-17 (×2): 17 g via ORAL
  Filled 2022-04-15 (×2): qty 1

## 2022-04-15 MED ORDER — KETOROLAC TROMETHAMINE 15 MG/ML IJ SOLN
15.0000 mg | Freq: Once | INTRAMUSCULAR | Status: AC
  Administered 2022-04-15: 15 mg via INTRAVENOUS

## 2022-04-15 MED ORDER — ACETAMINOPHEN 325 MG PO TABS
650.0000 mg | ORAL_TABLET | Freq: Four times a day (QID) | ORAL | Status: DC | PRN
  Administered 2022-04-15: 650 mg via ORAL

## 2022-04-15 MED ORDER — ENSURE ENLIVE PO LIQD
237.0000 mL | Freq: Two times a day (BID) | ORAL | Status: DC
  Administered 2022-04-15 – 2022-04-17 (×3): 237 mL via ORAL

## 2022-04-15 MED ORDER — METOPROLOL TARTRATE 25 MG PO TABS
12.5000 mg | ORAL_TABLET | Freq: Four times a day (QID) | ORAL | Status: DC
  Administered 2022-04-15: 12.5 mg via ORAL
  Filled 2022-04-15: qty 1

## 2022-04-15 MED ORDER — PANTOPRAZOLE SODIUM 40 MG PO TBEC
40.0000 mg | DELAYED_RELEASE_TABLET | Freq: Two times a day (BID) | ORAL | Status: DC
  Administered 2022-04-15 – 2022-04-17 (×4): 40 mg via ORAL
  Filled 2022-04-15 (×4): qty 1

## 2022-04-15 MED ORDER — TRAZODONE HCL 50 MG PO TABS
50.0000 mg | ORAL_TABLET | Freq: Every evening | ORAL | Status: DC | PRN
Start: 2022-04-15 — End: 2022-04-17

## 2022-04-15 MED ORDER — POTASSIUM CHLORIDE CRYS ER 20 MEQ PO TBCR
40.0000 meq | EXTENDED_RELEASE_TABLET | Freq: Once | ORAL | Status: AC
  Administered 2022-04-15: 40 meq via ORAL
  Filled 2022-04-15: qty 2

## 2022-04-15 NOTE — Progress Notes (Signed)
PHARMACIST - PHYSICIAN COMMUNICATION  CONCERNING: IV to Oral Route Change Policy  RECOMMENDATION: This patient is receiving pantoprazole by the intravenous route.  Based on criteria approved by the Pharmacy and Therapeutics Committee, the intravenous medication(s) is/are being converted to the equivalent oral dose form(s).   DESCRIPTION: These criteria include: The patient is eating (either orally or via tube) and/or has been taking other orally administered medications for a least 24 hours The patient has no evidence of active gastrointestinal bleeding or impaired GI absorption (gastrectomy, short bowel, patient on TNA or NPO).  If you have questions about this conversion, please contact the [place], [facility] 04/15/2022 12:16 PM

## 2022-04-15 NOTE — Consult Note (Signed)
Pharmacy Antibiotic Note  Ariana Wilson is a 61 y.o. 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 Patient is currently receiving broad spectrum antibiotics and Granix. Cultures have yielded no growth to date. Pharmacy has been consulted for cefepime and vancomycin dosing.  Plan:  Cefepime 2 g IV q8h  Vancomycin 1.25 g IV q24h --Calculated AUC: 467, Cmin 10.8 --Daily Scr per protocol --Levels at steady state or as clinically indicated  Height: '5\' 1"'$  (154.9 cm) Weight: 73.3 kg (161 lb 9.6 oz) IBW/kg (Calculated) : 47.8  Temp (24hrs), Avg:98.6 F (37 C), Min:98.1 F (36.7 C), Max:99.6 F (37.6 C)  Recent Labs  Lab 04/13/22 1119 04/13/22 1919 04/13/22 2109 04/14/22 0617 04/15/22 0621  WBC 1.1* 1.1*  --  1.1* 2.8*  CREATININE 0.83 0.87  --   --  0.61  LATICACIDVEN  --  0.7 1.0  --   --     Estimated Creatinine Clearance: 67.6 mL/min (by C-G formula based on SCr of 0.61 mg/dL).    Allergies  Allergen Reactions   Sulfa Antibiotics Hives    Says her tongue swelled a bit   Sulfonamide Derivatives Swelling    Tongue swelling    Antimicrobials this admission: Cefepime 8/2 >>  Vancomycin 8/2 >>   Dose adjustments this admission: N/A  Microbiology results: 8/2 BCx: NGTD 8/2 UCx: Low CFU Staphylococcus haemolyticus  8/3 MRSA PCR: (-)  Thank you for allowing pharmacy to be a part of this patient's care.  Benita Gutter 04/15/2022 12:25 PM

## 2022-04-15 NOTE — Progress Notes (Signed)
PROGRESS NOTE    Ariana Wilson  OMV:[id number] DOB: 08-19-1961 DOA: 04/13/2022 PCP: Rusty Aus, MD    Brief Narrative:  Ariana Wilson is a 61 y.o. female seen in ed with complaints of fever referred by oncologist for [facility].  Patient initially called his oncologist when he had a fever of 100.3 along with myalgias and joint pain Levaquin was called and with follow-up appointment scheduled.  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  Patient being followed by oncology team [facility] with Dr. Janese Banks.  Patient has received cisplatin etoposide chemotherapy  8/3 hemoglobin 6.6 discussed with Dr. Janese Banks about transfusion recommended irradiated PRBC.  Patient is agreeable to transfusion.  She reports  had transfusion in the past without any issues.  8/4 afib rvr .   Consultants:  Hematology  Procedures:   Antimicrobials:  Cefepime Vancomycin   Subjective Little sob with tachycardia. No cp    Objective: Vitals:   04/15/22 1019 04/15/22 1102 04/15/22 1105 04/15/22 1300  BP: (!) 143/107  (!) 95/58 (!) 87/43  Pulse: (!) 147 90 87 84  Resp: (!) 23 17 (!) 24 10  Temp:      TempSrc:      SpO2: 100% 98% 98% 96%  Weight:      Height:        Intake/Output Summary (Last 24 hours) at 04/15/2022 1418 Last data filed at 04/15/2022 0900 Gross per 24 hour  Intake 1048.67 ml  Output 100 ml  Net 948.67 ml   Filed Weights   04/14/22 0024  Weight: 73.3 kg    Examination: Calm, NAD Decrease bs Tachy, irreg s1/s2 no gallop Soft benign +bs No edema Aaoxox3  Mood and affect appropriate in current setting    Data Reviewed: I have personally reviewed following labs and imaging studies  CBC: Recent Labs  Lab 04/13/22 1119 04/13/22 1919  04/14/22 0617 04/14/22 2023 04/15/22 0621  WBC 1.1* 1.1* 1.1*  --  2.8*  NEUTROABS 0.0* 0.1* 0.0*  --  0.5*  HGB 7.5* 7.1* 6.6* 7.5* 7.7*  HCT 23.2* 22.4* 21.0* 22.9* 23.3*  MCV 90.6 91.4 91.7  --  86.3  PLT 191 171 168  --  628   Basic Metabolic Panel: Recent Labs  Lab 04/13/22 1119 04/13/22 1919 04/15/22 0621  NA 134* 134* 137  K 3.8 3.9 3.2*  CL 103 104 107  CO2 '24 24 22  '$ GLUCOSE 99 115* 96  BUN '13 14 11  '$ CREATININE 0.83 0.87 0.61  CALCIUM 8.4* 8.0* 8.2*  MG 2.3  --  1.7  PHOS  --   --  2.7   GFR: Estimated Creatinine Clearance: 67.6 mL/min (by C-G formula based on SCr of 0.61 mg/dL). Liver Function Tests: Recent Labs  Lab 04/13/22 1119 04/13/22 1919 04/15/22 0621  AST 11* 11* 15  ALT '11 10 11  '$ ALKPHOS 64 65 54  BILITOT 0.6 0.5 1.7*  PROT 6.7 6.4* 5.8*  ALBUMIN 3.5 3.4* 2.6*   No results for input(s): "LIPASE", "AMYLASE" in the last 168 hours. No results for input(s): "AMMONIA" in the last 168 hours. Coagulation Profile: No results for input(s): "INR", "PROTIME" in the last 168 hours. Cardiac Enzymes: No results for input(s): "CKTOTAL", "  CKMB", "CKMBINDEX", "TROPONINI" in the last 168 hours. BNP (last 3 results) No results for input(s): "PROBNP" in the last 8760 hours. HbA1C: No results for input(s): "HGBA1C" in the last 72 hours. CBG: Recent Labs  Lab 04/14/22 0023  GLUCAP 124*   Lipid Profile: No results for input(s): "CHOL", "HDL", "LDLCALC", "TRIG", "CHOLHDL", "LDLDIRECT" in the last 72 hours. Thyroid Function Tests: No results for input(s): "TSH", "T4TOTAL", "FREET4", "T3FREE", "THYROIDAB" in the last 72 hours. Anemia Panel: No results for input(s): "VITAMINB12", "FOLATE", "FERRITIN", "TIBC", "IRON", "RETICCTPCT" in the last 72 hours. Sepsis Labs: Recent Labs  Lab 04/13/22 1919 04/13/22 2109 04/14/22 0311 04/15/22 0621  PROCALCITON <0.10  --  <0.10 <0.10  LATICACIDVEN 0.7 1.0  --   --     Recent Results (from the past 240 hour(s))   Urine culture     Status: Abnormal   Collection Time: 04/13/22 11:33 AM   Specimen: Urine, Random  Result Value Ref Range Status   Specimen Description   Final    URINE, RANDOM Performed at [facility], [address] [place], [place] [address]    Special Requests   Final    NONE Performed at [facility], [address], [place], [place] [address]    Culture MULTIPLE SPECIES PRESENT, SUGGEST RECOLLECTION (A)  Final   Report Status 04/14/2022 FINAL  Final  Blood culture (routine x 2)     Status: None (Preliminary result)   Collection Time: 04/13/22  7:18 PM   Specimen: Right Antecubital; Blood  Result Value Ref Range Status   Specimen Description RIGHT ANTECUBITAL  Final   Special Requests   Final    BOTTLES DRAWN AEROBIC AND ANAEROBIC Blood Culture results may not be optimal due to an excessive volume of blood received in culture bottles   Culture   Final    NO GROWTH 2 DAYS Performed at [facility], [address] [place], [place] [address]    Report Status PENDING  Incomplete  Blood culture (routine x 2)     Status: None (Preliminary result)   Collection Time: 04/13/22  7:28 PM   Specimen: BLOOD  Result Value Ref Range Status   Specimen Description BLOOD LEFT ANTECUBITAL  Final   Special Requests   Final    BOTTLES DRAWN AEROBIC AND ANAEROBIC Blood Culture adequate volume   Culture   Final    NO GROWTH 2 DAYS Performed at [facility], [address] [place], [place] [address]    Report Status PENDING  Incomplete  Resp Panel by RT-PCR (Flu A&B, Covid) Anterior Nasal Swab     Status: None   Collection Time: 04/13/22  8:52 PM   Specimen: Anterior Nasal Swab  Result Value Ref Range Status   SARS Coronavirus 2 by RT PCR NEGATIVE NEGATIVE Final    Comment: (NOTE) SARS-CoV-2 target nucleic acids are NOT DETECTED.  The SARS-CoV-2 RNA is generally detectable in upper respiratory specimens during the acute phase of infection. The  lowest concentration of SARS-CoV-2 viral copies this assay can detect is 138 copies/mL. A negative result does not preclude SARS-Cov-2 infection and should not be used as the sole basis for treatment or other patient management decisions. A negative result may occur with  improper specimen collection/handling, submission of specimen other than nasopharyngeal swab, presence of viral mutation(s) within the areas targeted by this assay, and inadequate number of viral copies(<138 copies/mL). A negative result must be combined with clinical observations, patient history, and epidemiological information.  The expected result is Negative.  Fact Sheet for Patients:  EntrepreneurPulse.com.au  Fact Sheet for Healthcare Providers:  IncredibleEmployment.be  This test is no t yet approved or cleared by the [place] FDA and  has been authorized for detection and/or diagnosis of SARS-CoV-2 by FDA under an Emergency Use Authorization (EUA). This EUA will remain  in effect (meaning this test can be used) for the duration of the COVID-19 declaration under Section 564(b)(1) of the Act, 21 U.S.C.section 360bbb-3(b)(1), unless the authorization is terminated  or revoked sooner.       Influenza A by PCR NEGATIVE NEGATIVE Final   Influenza B by PCR NEGATIVE NEGATIVE Final    Comment: (NOTE) The Xpert Xpress SARS-CoV-2/FLU/RSV plus assay is intended as an aid in the diagnosis of influenza from Nasopharyngeal swab specimens and should not be used as a sole basis for treatment. Nasal washings and aspirates are unacceptable for Xpert Xpress SARS-CoV-2/FLU/RSV testing.  Fact Sheet for Patients: EntrepreneurPulse.com.au  Fact Sheet for Healthcare Providers: IncredibleEmployment.be  This test is not yet approved or cleared by the [place] FDA and has been authorized for detection and/or diagnosis of SARS-CoV-2 by FDA under  an Emergency Use Authorization (EUA). This EUA will remain in effect (meaning this test can be used) for the duration of the COVID-19 declaration under Section 564(b)(1) of the Act, 21 U.S.C. section 360bbb-3(b)(1), unless the authorization is terminated or revoked.  Performed at [facility], [address], [place], [place] [address]   Urine Culture     Status: Abnormal (Preliminary result)   Collection Time: 04/13/22  8:52 PM   Specimen: In/Out Cath Urine  Result Value Ref Range Status   Specimen Description   Final    IN/OUT CATH URINE Performed at [facility], [address] [place], [place] [address]    Special Requests   Final    NONE Performed at [facility], [address] [place], [place] [address]    Culture (A)  Final    4,000 COLONIES/mL STAPHYLOCOCCUS HAEMOLYTICUS SUSCEPTIBILITIES TO FOLLOW Performed at [facility], [place] [address] [place], [place] [address]    Report Status PENDING  Incomplete  MRSA Next Gen by PCR, Nasal     Status: None   Collection Time: 04/14/22 12:22 AM   Specimen: Nasal Mucosa; Nasal Swab  Result Value Ref Range Status   MRSA by PCR Next Gen NOT DETECTED NOT DETECTED Final    Comment: (NOTE) The GeneXpert MRSA Assay (FDA approved for NASAL specimens only), is one component of a comprehensive MRSA colonization surveillance program. It is not intended to diagnose MRSA infection nor to guide or monitor treatment for MRSA infections. Test performance is not FDA approved in patients less than 33 years old. Performed at [facility], [address], [place],  [address]          Radiology Studies: CT Angio Chest Pulmonary Embolism (PE) W or WO Contrast  Result Date: 04/13/2022 CLINICAL DATA:  Pulmonary embolism (PE) suspected, high prob rales on exam bl. Neutropenic fever. History of esophageal cancer, active chemotherapy. EXAM: CT ANGIOGRAPHY CHEST WITH CONTRAST TECHNIQUE:  Multidetector CT imaging of the chest was performed using the standard protocol during bolus administration of intravenous contrast. Multiplanar CT image reconstructions and MIPs were obtained to evaluate the vascular anatomy. RADIATION DOSE REDUCTION: This exam was performed according to the departmental dose-optimization program which includes automated exposure control, adjustment of the mA and/or kV according to patient size  and/or use of iterative reconstruction technique. CONTRAST:  44m OMNIPAQUE IOHEXOL 350 MG/ML SOLN COMPARISON:  Chest radiograph earlier today. CT 03/17/2022. PET CT 03/22/2022 FINDINGS: Cardiovascular: There are no filling defects within the pulmonary arteries to suggest pulmonary embolus. Mild aortic atherosclerosis, no aneurysm. There are coronary artery calcifications. Heart is normal in size. No pericardial effusion. Mediastinum/Nodes: There is wall thickening of the distal esophagus extending to the gastroesophageal junction consistent with known malignancy. Mild adjacent paraesophageal fat stranding. No paraesophageal collection or pneumomediastinum. Upper esophagus is minimally patulous. The paraesophageal lymph node on prior exam is difficult to delineate from the esophageal wall thickening currently. Left upper paratracheal no new the thoracic inlet measures 14 mm, series 5, image 28, this previously measured 10 mm. 14 mm left upper paratracheal node series 5, image 52, previously 8 mm. There is a 13 mm right supraclavicular node, series 5, image 46, previously 11 mm. There are increased number of right upper paratracheal nodes from prior exam, that are subcentimeter. Lungs/Pleura: Mild breathing motion artifact limits detailed parenchymal assessment. Scattered areas of bandlike atelectasis. Heterogeneous pulmonary parenchyma. No confluent consolidation. No visible pulmonary nodule. No pleural effusion. Upper Abdomen: No acute findings. Musculoskeletal: There are no acute or  suspicious osseous abnormalities. No chest wall soft tissue abnormalities. Review of the MIP images confirms the above findings. IMPRESSION: 1. No pulmonary embolus. 2. Heterogeneous pulmonary parenchyma may represent small airways disease. Scattered areas of bandlike atelectasis. No evidence of pneumonia. 3. Distal esophageal wall thickening extending to the gastroesophageal junction consistent with known malignancy. There is mild adjacent paraesophageal fat stranding. 4. Mediastinal adenopathy with slight increase size of multiple lymph nodes from prior exam. Attention on subsequent staging exams recommended. Aortic Atherosclerosis (ICD10-I70.0). Electronically Signed   By: MKeith RakeM.D.   On: 04/13/2022 23:25        Scheduled Meds:  Chlorhexidine Gluconate Cloth  6 each Topical Q0600   dexamethasone  1 mg Oral Daily   feeding supplement  237 mL Oral BID BM   flecainide  50 mg Oral BID   metoprolol tartrate  25 mg Oral Q6H   midodrine  5 mg Oral TID WC   pantoprazole  40 mg Oral BID   Tbo-Filgrastim  300 mcg Subcutaneous Daily   Continuous Infusions:  ceFEPime (MAXIPIME) IV 2 g (04/15/22 1112)   vancomycin 1,250 mg (04/14/22 2103)    Assessment & Plan:   Principal Problem:   Febrile neutropenia ([place] Active Problems:   ATRIAL FIBRILLATION   Anemia   Essential hypertension   Primary malignant neuroendocrine neoplasm of esophagus (HCC)   Hypotension   Neutropenic fever Chest x-ray negative 8/4 ucx staph haemol.  Continue current iv abx    Hypotension On IV fluids 8/4 s/p prbc 1 unit on 8/3, improved now     Anemia Neutropenia Oncology following 8/4 s/p 1 unit prbc irradiated on 8/3 Hg stable On neupogen, wbc improving. No reports of bleed     PAF Was in SR, now afib rvr, since bp better will resume beta blk and flecainide. Asa on hold due to anemia, requiring transfusion.   DVT prophylaxis: scd Code Status:full Family Communication: Husband at  bedside  Disposition Plan:  Status is: Inpatient Remains inpatient appropriate because: IV treatment.  Afib rvr       LOS: 2 days   Time spent: 35 minutes    SNolberto Hanlon MD [facility] Pager [phone]  If 7PM-7AM, please contact night-coverage 04/15/2022, 2:18 PM

## 2022-04-16 ENCOUNTER — Other Ambulatory Visit: Payer: Self-pay | Admitting: Internal Medicine

## 2022-04-16 DIAGNOSIS — R5081 Fever presenting with conditions classified elsewhere: Secondary | ICD-10-CM | POA: Diagnosis not present

## 2022-04-16 DIAGNOSIS — D709 Neutropenia, unspecified: Secondary | ICD-10-CM | POA: Diagnosis not present

## 2022-04-16 LAB — URINE CULTURE: Culture: 4000 — AB

## 2022-04-16 LAB — HEMOGLOBIN AND HEMATOCRIT, BLOOD
HCT: 24.2 % — ABNORMAL LOW (ref 36.0–46.0)
Hemoglobin: 7.8 g/dL — ABNORMAL LOW (ref 12.0–15.0)

## 2022-04-16 MED ORDER — MIDODRINE HCL 5 MG PO TABS
10.0000 mg | ORAL_TABLET | Freq: Three times a day (TID) | ORAL | Status: DC
Start: 2022-04-16 — End: 2022-04-17
  Administered 2022-04-16 – 2022-04-17 (×3): 10 mg via ORAL
  Filled 2022-04-16 (×3): qty 2

## 2022-04-16 MED ORDER — METOPROLOL TARTRATE 25 MG PO TABS
12.5000 mg | ORAL_TABLET | Freq: Four times a day (QID) | ORAL | Status: DC
  Administered 2022-04-16: 12.5 mg via ORAL
  Filled 2022-04-16 (×2): qty 1

## 2022-04-16 NOTE — Progress Notes (Signed)
Ariana Wilson   DOB:[birth year]   FO#:[id number]    Subjective: Patient denies any fevers or chills.  Continues to complain of generalized weakness.  No worsening cough.  She is accompanied by husband.  Nontoxic.  Objective:  Vitals:   04/16/22 1626 04/16/22 1957  BP: (!) 99/51 (!) 112/55  Pulse: 67 63  Resp:  18  Temp:  98.1 F (36.7 C)  SpO2: 98% 96%     Intake/Output Summary (Last 24 hours) at 04/16/2022 2242 Last data filed at 04/16/2022 1400 Gross per 24 hour  Intake 550 ml  Output --  Net 550 ml    Physical Exam Vitals and nursing note reviewed.  HENT:     Head: Normocephalic and atraumatic.     Mouth/Throat:     Pharynx: Oropharynx is clear.  Eyes:     Extraocular Movements: Extraocular movements intact.     Pupils: Pupils are equal, round, and reactive to light.  Cardiovascular:     Rate and Rhythm: Normal rate and regular rhythm.  Pulmonary:     Comments: Decreased breath sounds bilaterally.  Abdominal:     Palpations: Abdomen is soft.  Musculoskeletal:        General: Normal range of motion.     Cervical back: Normal range of motion.  Skin:    General: Skin is warm.  Neurological:     General: No focal deficit present.     Mental Status: She is alert and oriented to person, place, and time.  Psychiatric:        Behavior: Behavior normal.        Judgment: Judgment normal.      Labs:  Lab Results  Component Value Date   WBC 2.8 (L) 04/15/2022   HGB 7.8 (L) 04/16/2022   HCT 24.2 (L) 04/16/2022   MCV 86.3 04/15/2022   PLT 231 04/15/2022   NEUTROABS 0.5 (L) 04/15/2022    Lab Results  Component Value Date   NA 137 04/15/2022   K 3.2 (L) 04/15/2022   CL 107 04/15/2022   CO2 22 04/15/2022   Studies:  No results found.  #61 year old female patient with a history of esophagus cancer currently on chemotherapy-immunotherapy is admitted to hospital for neutropenic fever/sepsis  #Neutropenic fever/sepsis/shock-on broad-spectrum antibiotics; overall  clinically improving.  Continue Granix ANC greater than 1.5.  If needed patient will be continued outpatient growth factor support next week in the clinic.  #Anemia-secondary chemotherapy; s/p PRBC transfusion hemoglobin today 7.8.  I think it is reasonable to hold off blood transfusion as hemoglobin is trending up.  #A-fib with RVR-currently on flecainide/sinus rhythm.  #Discussed that we will have the patient follow-up in the clinic next week as a follow-up .  Patient could be discharged if clinically stable tomorrow.  Discussed with Dr.Amery.   Cammie Sickle, MD 04/16/2022  10:42 PM

## 2022-04-16 NOTE — Progress Notes (Signed)
Ordered Zarxio- in case pt needs post discharge.  GB

## 2022-04-16 NOTE — Consult Note (Signed)
Pharmacy Antibiotic Note  Ariana Wilson is a 61 y.o. 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 Patient is currently receiving broad spectrum antibiotics and Granix. Cultures have yielded no growth to date. Pharmacy has been consulted for cefepime and vancomycin dosing.  -8/2 Bcx NGx3days,   -8/2 Ucx: 4000  c/ml staph haemolyticus -MRSA PCR neg -WBC 2.8  ANC 0.5  afebrile  Plan:  Cefepime 2 g IV q8h  Vancomycin 1.25 g IV q24h --Calculated AUC: 467, Cmin 10.8 --Daily Scr per protocol --Levels at steady state or as clinically indicated    Height: '5\' 1"'$  (154.9 cm) Weight: 73.3 kg (161 lb 9.6 oz) IBW/kg (Calculated) : 47.8  Temp (24hrs), Avg:98.6 F (37 C), Min:98.3 F (36.8 C), Max:98.8 F (37.1 C)  Recent Labs  Lab 04/13/22 1119 04/13/22 1919 04/13/22 2109 04/14/22 0617 04/15/22 0621  WBC 1.1* 1.1*  --  1.1* 2.8*  CREATININE 0.83 0.87  --   --  0.61  LATICACIDVEN  --  0.7 1.0  --   --      Estimated Creatinine Clearance: 67.6 mL/min (by C-G formula based on SCr of 0.61 mg/dL).    Allergies  Allergen Reactions   Sulfa Antibiotics Hives    Says her tongue swelled a bit   Sulfonamide Derivatives Swelling    Tongue swelling    Antimicrobials this admission: Cefepime 8/2 (evening)>>  Vancomycin 8/2 (evening)>>   Dose adjustments this admission: N/A  Microbiology results: 8/2 BCx: NGTD 8/2 UCx: Low CFU Staphylococcus haemolyticus  8/3 MRSA PCR: (-)  Thank you for allowing pharmacy to be a part of this patient's care.  Gargi Berch A 04/16/2022 12:45 PM

## 2022-04-16 NOTE — Progress Notes (Signed)
PROGRESS NOTE    Ariana Wilson  FXT:[id number] DOB: [birth year] DOA: 04/13/2022 PCP: Ariana Aus, MD    Brief Narrative:  Ariana Wilson is a 61 y.o. female seen in ed with complaints of fever referred by oncologist for [facility] of 0.  Patient initially called his oncologist when he had a fever of 100.3 along with myalgias and joint pain Levaquin was called and with follow-up appointment scheduled.  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  Patient being followed by oncology team Ariana Wilson with Dr. Janese Banks.  Patient has received cisplatin etoposide chemotherapy  8/3 hemoglobin 6.6 discussed with Dr. Janese Banks about transfusion recommended irradiated PRBC.  Patient is agreeable to transfusion.  She reports  had transfusion in the past without any issues.  8/4 afib rvr .  8/5 remains in sinus rhythm.  Hemoglobin 7.8, spoke to hematology they did not recommend transfusion as her hemoglobin increasing.  Consultants:  Hematology  Procedures:   Antimicrobials:  Cefepime Vancomycin   Subjective Feels better.  Ambulated no dizziness or shortness of breath, no chest pain    Objective: Vitals:   04/16/22 0600 04/16/22 0625 04/16/22 0800 04/16/22 1000  BP: (!) 81/40 (!) 95/46  (!) 91/47  Pulse: 71 66  73  Resp: (!) '21 20  19  '$ Temp:   98.3 F (36.8 C)   TempSrc:   Oral   SpO2: 97% 97%  96%  Weight:      Height:        Intake/Output Summary (Last 24 hours) at 04/16/2022 1447 Last data filed at 04/16/2022 1400 Gross per 24 hour  Intake 1350.53 ml  Output --  Net 1350.53 ml   Filed Weights   04/14/22 0024  Weight: 73.3 kg    Examination: Calm, NAD Cta no w/r Reg s1/s2 no gallop Soft benign +bs No edema Aaoxox3  Mood and affect appropriate in  current setting    Data Reviewed: I have personally reviewed following labs and imaging studies  CBC: Recent Labs  Lab 04/13/22 1119 04/13/22 1919 04/14/22 0617 04/14/22 2023 04/15/22 0621 04/16/22 0933  WBC 1.1* 1.1* 1.1*  --  2.8*  --   NEUTROABS 0.0* 0.1* 0.0*  --  0.5*  --   HGB 7.5* 7.1* 6.6* 7.5* 7.7* 7.8*  HCT 23.2* 22.4* 21.0* 22.9* 23.3* 24.2*  MCV 90.6 91.4 91.7  --  86.3  --   PLT 191 171 168  --  231  --    Basic Metabolic Panel: Recent Labs  Lab 04/13/22 1119 04/13/22 1919 04/15/22 0621  NA 134* 134* 137  K 3.8 3.9 3.2*  CL 103 104 107  CO2 '24 24 22  '$ GLUCOSE 99 115* 96  BUN '13 14 11  '$ CREATININE 0.83 0.87 0.61  CALCIUM 8.4* 8.0* 8.2*  MG 2.3  --  1.7  PHOS  --   --  2.7   GFR: Estimated Creatinine Clearance: 67.6 mL/min (by C-G formula based on SCr of 0.61 mg/dL). Liver Function Tests: Recent Labs  Lab 04/13/22 1119 04/13/22 1919 04/15/22 0621  AST 11* 11* 15  ALT '11 10 11  '$ ALKPHOS 64 65 54  BILITOT 0.6 0.5 1.7*  PROT 6.7 6.4* 5.8*  ALBUMIN 3.5 3.4* 2.6*  No results for input(s): "LIPASE", "AMYLASE" in the last 168 hours. No results for input(s): "AMMONIA" in the last 168 hours. Coagulation Profile: No results for input(s): "INR", "PROTIME" in the last 168 hours. Cardiac Enzymes: No results for input(s): "CKTOTAL", "CKMB", "CKMBINDEX", "TROPONINI" in the last 168 hours. BNP (last 3 results) No results for input(s): "PROBNP" in the last 8760 hours. HbA1C: No results for input(s): "HGBA1C" in the last 72 hours. CBG: Recent Labs  Lab 04/14/22 0023  GLUCAP 124*   Lipid Profile: No results for input(s): "CHOL", "HDL", "LDLCALC", "TRIG", "CHOLHDL", "LDLDIRECT" in the last 72 hours. Thyroid Function Tests: No results for input(s): "TSH", "T4TOTAL", "FREET4", "T3FREE", "THYROIDAB" in the last 72 hours. Anemia Panel: No results for input(s): "VITAMINB12", "FOLATE", "FERRITIN", "TIBC", "IRON", "RETICCTPCT" in the last 72 hours. Sepsis  Labs: Recent Labs  Lab 04/13/22 1919 04/13/22 2109 04/14/22 0311 04/15/22 0621  PROCALCITON <0.10  --  <0.10 <0.10  LATICACIDVEN 0.7 1.0  --   --     Recent Results (from the past 240 hour(s))  Urine culture     Status: Abnormal   Collection Time: 04/13/22 11:33 AM   Specimen: Urine, Random  Result Value Ref Range Status   Specimen Description   Final    URINE, RANDOM Performed at [facility], [address], [place], [place] [address]    Special Requests   Final    NONE Performed at [facility], [address] [place], [place] [address]    Culture MULTIPLE SPECIES PRESENT, SUGGEST RECOLLECTION (A)  Final   Report Status 04/14/2022 FINAL  Final  Blood culture (routine x 2)     Status: None (Preliminary result)   Collection Time: 04/13/22  7:18 PM   Specimen: Right Antecubital; Blood  Result Value Ref Range Status   Specimen Description RIGHT ANTECUBITAL  Final   Special Requests   Final    BOTTLES DRAWN AEROBIC AND ANAEROBIC Blood Culture results may not be optimal due to an excessive volume of blood received in culture bottles   Culture   Final    NO GROWTH 3 DAYS Performed at [facility], [address] [place], [place] [address]    Report Status PENDING  Incomplete  Blood culture (routine x 2)     Status: None (Preliminary result)   Collection Time: 04/13/22  7:28 PM   Specimen: BLOOD  Result Value Ref Range Status   Specimen Description BLOOD LEFT ANTECUBITAL  Final   Special Requests   Final    BOTTLES DRAWN AEROBIC AND ANAEROBIC Blood Culture adequate volume   Culture   Final    NO GROWTH 3 DAYS Performed at [facility], [address], [place],  [address]    Report Status PENDING  Incomplete  Resp Panel by RT-PCR (Flu A&B, Covid) Anterior Nasal Swab     Status: None   Collection Time: 04/13/22  8:52 PM   Specimen: Anterior Nasal Swab  Result Value Ref Range Status   SARS Coronavirus 2 by RT PCR NEGATIVE  NEGATIVE Final    Comment: (NOTE) SARS-CoV-2 target nucleic acids are NOT DETECTED.  The SARS-CoV-2 RNA is generally detectable in upper respiratory specimens during the acute phase of infection. The lowest concentration of SARS-CoV-2 viral copies this assay can detect is 138 copies/mL. A negative result does not preclude SARS-Cov-2 infection and should not be used as the sole basis for treatment or other patient management decisions. A negative result may occur with  improper  specimen collection/handling, submission of specimen other than nasopharyngeal swab, presence of viral mutation(s) within the areas targeted by this assay, and inadequate number of viral copies(<138 copies/mL). A negative result must be combined with clinical observations, patient history, and epidemiological information. The expected result is Negative.  Fact Sheet for Patients:  EntrepreneurPulse.com.au  Fact Sheet for Healthcare Providers:  IncredibleEmployment.be  This test is no t yet approved or cleared by the [place] FDA and  has been authorized for detection and/or diagnosis of SARS-CoV-2 by FDA under an Emergency Use Authorization (EUA). This EUA will remain  in effect (meaning this test can be used) for the duration of the COVID-19 declaration under Section 564(b)(1) of the Act, 21 U.S.C.section 360bbb-3(b)(1), unless the authorization is terminated  or revoked sooner.       Influenza A by PCR NEGATIVE NEGATIVE Final   Influenza B by PCR NEGATIVE NEGATIVE Final    Comment: (NOTE) The Xpert Xpress SARS-CoV-2/FLU/RSV plus assay is intended as an aid in the diagnosis of influenza from Nasopharyngeal swab specimens and should not be used as a sole basis for treatment. Nasal washings and aspirates are unacceptable for Xpert Xpress SARS-CoV-2/FLU/RSV testing.  Fact Sheet for Patients: EntrepreneurPulse.com.au  Fact Sheet for Healthcare  Providers: IncredibleEmployment.be  This test is not yet approved or cleared by the [place] FDA and has been authorized for detection and/or diagnosis of SARS-CoV-2 by FDA under an Emergency Use Authorization (EUA). This EUA will remain in effect (meaning this test can be used) for the duration of the COVID-19 declaration under Section 564(b)(1) of the Act, 21 U.S.C. section 360bbb-3(b)(1), unless the authorization is terminated or revoked.  Performed at [facility], [address] [place], [place] [address]   Urine Culture     Status: Abnormal   Collection Time: 04/13/22  8:52 PM   Specimen: In/Out Cath Urine  Result Value Ref Range Status   Specimen Description   Final    IN/OUT CATH URINE Performed at [facility], [address] [place], [place] [address]    Special Requests   Final    NONE Performed at [facility], [place], [place] [address]    Culture 4,000 COLONIES/mL STAPHYLOCOCCUS HAEMOLYTICUS (A)  Final   Report Status 04/16/2022 FINAL  Final   Organism ID, Bacteria STAPHYLOCOCCUS HAEMOLYTICUS (A)  Final      Susceptibility   Staphylococcus haemolyticus - MIC*    CIPROFLOXACIN >=8 RESISTANT Resistant     GENTAMICIN <=0.5 SENSITIVE Sensitive     NITROFURANTOIN <=16 SENSITIVE Sensitive     OXACILLIN >=4 RESISTANT Resistant     TETRACYCLINE <=1 SENSITIVE Sensitive     VANCOMYCIN <=0.5 SENSITIVE Sensitive     TRIMETH/SULFA <=10 SENSITIVE Sensitive     CLINDAMYCIN >=8 RESISTANT Resistant     RIFAMPIN <=0.5 SENSITIVE Sensitive     Inducible Clindamycin NEGATIVE Sensitive     * 4,000 COLONIES/mL STAPHYLOCOCCUS HAEMOLYTICUS  MRSA Next Gen by PCR, Nasal     Status: None   Collection Time: 04/14/22 12:22 AM   Specimen: Nasal Mucosa; Nasal Swab  Result Value Ref Range Status   MRSA by PCR Next Gen NOT DETECTED NOT DETECTED Final    Comment: (NOTE) The GeneXpert MRSA Assay (FDA approved for NASAL  specimens only), is one component of a comprehensive MRSA colonization surveillance program. It is not intended to diagnose MRSA infection nor to guide or monitor treatment for MRSA infections. Test performance is not FDA approved in  patients less than 24 years old. Performed at [facility], [address] [place], [place] [address]          Radiology Studies: No results found.      Scheduled Meds:  Chlorhexidine Gluconate Cloth  6 each Topical Q0600   dexamethasone  1 mg Oral Daily   feeding supplement  237 mL Oral BID BM   flecainide  50 mg Oral BID   melatonin  5 mg Oral QHS   metoprolol tartrate  12.5 mg Oral Q6H   midodrine  10 mg Oral TID WC   pantoprazole  40 mg Oral BID   Tbo-Filgrastim  300 mcg Subcutaneous Daily   Continuous Infusions:  ceFEPime (MAXIPIME) IV Stopped (04/16/22 1310)   vancomycin Stopped (04/15/22 2312)    Assessment & Plan:   Principal Problem:   Febrile neutropenia ([place]) Active Problems:   ATRIAL FIBRILLATION   Anemia   Essential hypertension   Primary malignant neuroendocrine neoplasm of esophagus (HCC)   Hypotension   Neutropenic fever Chest x-ray negative 8/4 ucx staph haemol.  8/5 blood cultures negative  Urine Staph hemolyticus  Continue current IV antibiotics       Hypotension 8/4 s/p prbc 1 unit on 8/3, improved now 8/5 BP improving, asymptomatic Increase midodrine to 10 mg 3 times daily     Anemia Neutropenia Oncology following 8/4 s/p 1 unit prbc irradiated on 8/3 On neupogen, wbc improving. No reports of bleed 8/5 hemoglobin improving no need for transfusion     PAF Was in SR, now afib rvr, since bp better will resume beta blk and flecainide. 8/5 now in SR aspirin on hold due to anemia requiring transfusion  Decrease beta blk due to hypotension     DVT prophylaxis: scd Code Status:full Family Communication: Husband at bedside  Disposition Plan:  Status is: Inpatient Remains  inpatient appropriate because: IV treatment.      LOS: 3 days   Time spent: 35 minutes    Nolberto Hanlon, MD [facility] Pager [phone]  If 7PM-7AM, please contact night-coverage 04/16/2022, 2:47 PM

## 2022-04-17 ENCOUNTER — Telehealth: Payer: Self-pay | Admitting: Internal Medicine

## 2022-04-17 DIAGNOSIS — R5081 Fever presenting with conditions classified elsewhere: Secondary | ICD-10-CM | POA: Diagnosis not present

## 2022-04-17 DIAGNOSIS — D709 Neutropenia, unspecified: Secondary | ICD-10-CM | POA: Diagnosis not present

## 2022-04-17 LAB — CBC WITH DIFFERENTIAL/PLATELET
Abs Immature Granulocytes: 5.32 10*3/uL — ABNORMAL HIGH (ref 0.00–0.07)
Basophils Absolute: 0.1 10*3/uL (ref 0.0–0.1)
Basophils Relative: 0 %
Eosinophils Absolute: 0 10*3/uL (ref 0.0–0.5)
Eosinophils Relative: 0 %
HCT: 23.5 % — ABNORMAL LOW (ref 36.0–46.0)
Hemoglobin: 7.5 g/dL — ABNORMAL LOW (ref 12.0–15.0)
Immature Granulocytes: 18 %
Lymphocytes Relative: 11 %
Lymphs Abs: 3.2 10*3/uL (ref 0.7–4.0)
MCH: 28.7 pg (ref 26.0–34.0)
MCHC: 31.9 g/dL (ref 30.0–36.0)
MCV: 90 fL (ref 80.0–100.0)
Monocytes Absolute: 3.3 10*3/uL — ABNORMAL HIGH (ref 0.1–1.0)
Monocytes Relative: 11 %
Neutro Abs: 17.4 10*3/uL — ABNORMAL HIGH (ref 1.7–7.7)
Neutrophils Relative %: 60 %
Platelets: 287 10*3/uL (ref 150–400)
RBC: 2.61 MIL/uL — ABNORMAL LOW (ref 3.87–5.11)
RDW: 16.3 % — ABNORMAL HIGH (ref 11.5–15.5)
Smear Review: NORMAL
WBC: 29.2 10*3/uL — ABNORMAL HIGH (ref 4.0–10.5)
nRBC: 2.1 % — ABNORMAL HIGH (ref 0.0–0.2)

## 2022-04-17 LAB — CREATININE, SERUM
Creatinine, Ser: 0.92 mg/dL (ref 0.44–1.00)
GFR, Estimated: >60 mL/min (ref >60)

## 2022-04-17 LAB — POTASSIUM: Potassium: 3.4 mmol/L — ABNORMAL LOW (ref 3.5–5.1)

## 2022-04-17 LAB — MAGNESIUM: Magnesium: 2.1 mg/dL (ref 1.7–2.4)

## 2022-04-17 MED ORDER — POTASSIUM CHLORIDE CRYS ER 20 MEQ PO TBCR
40.0000 meq | EXTENDED_RELEASE_TABLET | Freq: Once | ORAL | Status: AC
  Administered 2022-04-17: 40 meq via ORAL
  Filled 2022-04-17: qty 2

## 2022-04-17 MED ORDER — METOPROLOL SUCCINATE ER 25 MG PO TB24: 12.5000 mg | ORAL_TABLET | Freq: Two times a day (BID) | ORAL | 0 refills | Status: DC

## 2022-04-17 MED ORDER — MIDODRINE HCL 10 MG PO TABS: 10.0000 mg | ORAL_TABLET | Freq: Three times a day (TID) | ORAL | 0 refills | Status: AC

## 2022-04-17 MED ORDER — METOPROLOL SUCCINATE ER 25 MG PO TB24: 12.5000 mg | ORAL_TABLET | Freq: Two times a day (BID) | ORAL | Status: DC

## 2022-04-17 NOTE — Discharge Summary (Signed)
Ariana Wilson WNI:[id number] DOB: 26-Dec-1960 DOA: 04/13/2022  PCP: Rusty Aus, MD  Admit date: 04/13/2022 Discharge date: 04/17/2022  Admitted From: Home Disposition: Home  Recommendations for Outpatient Follow-up:  Follow up with PCP in 1 week Please obtain BMP/CBC in one week Please follow up hematology next week     Discharge Condition:Stable CODE STATUS: Full Diet recommendation: Heart Healthy  Brief/Interim Summary: Per [facility]:Ariana Wilson is a 61 y.o. female seen in ed with complaints of fever referred by oncologist for [facility] of 0.  Patient initially called his oncologist when he had a fever of 100.3 along with myalgias and joint pain Levaquin was called and with follow-up appointment scheduled.  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  Patient being followed by oncology team [facility] with Dr. Janese Banks.  Patient has received cisplatin etoposide chemotherapy Patient was found with hemoglobin 6.6 and was transfused with irradiated PRBC.  Hematology was consulted and following.  She did go into A-fib RVR at some point and her home meds were reinstituted with her remaining in sinus rhythm.  Blood cultures had no growth.  1 urine culture with multiple species and another 1 with staph hemolyticus.  She was initially started on IV antibiotics.  Per hematologist recommendation patient will be discharged with Levaquin x7 days (she already received the prescription as outpatient prior to her admission and has the medication at home.  She was cleared for discharge by hematology today.   Neutropenic fever Chest x-ray negative 8/4 ucx staph haemol. x1, blood cultures were negative Was treated with IV antibiotics will be discharged with Levaquin 7 days p.o.            Hypotension  s/p irradiated prbc 1 unit on 8/3 Added midodrine  Asx She does report her bp at home also runs low .     Anemia Neutropenia Oncology was consulted s/p 1 unit prbc irradiated on 8/3 Started on neupogen, with wbc improving. 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[place])   Hypotension    Discharge Instructions  Discharge Instructions     Call MD for:  temperature >100.4   Complete by: As directed    Diet - low sodium heart healthy   Complete by: As directed    Discharge instructions   Complete by: As directed    Monitor blood pressure, once it starts rising, can take you regular toprol xl dose as we discussed   Increase activity slowly   Complete by: As directed       Allergies as of 04/17/2022       Reactions   Sulfa Antibiotics Hives   Says her tongue swelled  a bit   Sulfonamide Derivatives Swelling   Tongue swelling        Medication List     STOP taking these medications    amoxicillin-clavulanate 875-125 MG tablet Commonly known as: AUGMENTIN   aspirin 325 MG tablet   diltiazem 180 MG 24 hr capsule Commonly known as: TIAZAC   estradiol 0.1 MG/GM vaginal cream Commonly known as: ESTRACE   Fish Oil 1200 MG Caps   L-Lysine 1000 MG Tabs   meclizine 25 MG tablet Commonly known as: ANTIVERT   metoprolol tartrate 25 MG tablet Commonly known as: LOPRESSOR   omeprazole 20 MG capsule Commonly known as: PRILOSEC    ondansetron 8 MG tablet Commonly known as: Zofran   potassium chloride SA 20 MEQ tablet Commonly known as: KLOR-CON M   traZODone 50 MG tablet Commonly known as: DESYREL       TAKE these medications    Calcium Carbonate-Vitamin D 600-200 MG-UNIT Tabs Take 1 tablet by mouth daily.   cetirizine 10 MG tablet Commonly known as: ZYRTEC Take 10 mg by mouth daily.   Cholecalciferol 25 MCG (1000 UT) tablet Take 1,000 Units by mouth daily.   cyanocobalamin 1000 MCG tablet Commonly known as: VITAMIN B12 Take 1,000 mcg by mouth daily.   dexamethasone 4 MG tablet Commonly known as: DECADRON Pt has 3 day chemo regimen and will need to take 2 tablets ( '8mg'$ ) on day 4 for each cycle of chemo. Make sure to eat food in the am and then take the med   DHEA 25 MG tablet Take 25 mg by mouth daily.   diclofenac Sodium 1 % Gel Commonly known as: VOLTAREN Apply 2 g topically 4 (four) times daily.   flecainide 50 MG tablet Commonly known as: TAMBOCOR Take 1 tablet by mouth 2 (two) times daily.   levofloxacin 500 MG tablet Commonly known as: Levaquin Take 1 tablet (500 mg total) by mouth daily.   lidocaine-prilocaine cream Commonly known as: EMLA Apply to affected area once   LORazepam 0.5 MG tablet Commonly known as: Ativan Take 1 tablet (0.5 mg total) by mouth every 6 (six) hours as needed (Nausea or vomiting).   metoprolol succinate 25 MG 24 hr tablet Commonly known as: TOPROL-XL Take 0.5 tablets (12.5 mg total) by mouth 2 (two) times daily.   midodrine 10 MG tablet Commonly known as: PROAMATINE Take 1 tablet (10 mg total) by mouth 3 (three) times daily with meals.   mometasone 50 MCG/ACT nasal spray Commonly known as: NASONEX Place 2 sprays into the nose daily.   pantoprazole 40 MG tablet Commonly known as: PROTONIX Take 40 mg by mouth daily.   Probiotic 250 MG Caps Take 1 capsule by mouth daily.   prochlorperazine 10 MG tablet Commonly known as: COMPAZINE Take  1 tablet (10 mg total) by mouth every 6 (six) hours as needed (Nausea or vomiting).   simvastatin 20 MG tablet Commonly known as: ZOCOR TAKE 1 TABLET BY MOUTH EVERY EVENING.   Zytaze 25-500 MG Caps Generic drug: Zinc Citrate-Phytase Take 1 Dose by mouth daily.        Allergies  Allergen Reactions   Sulfa Antibiotics Hives    Says her tongue swelled a bit   Sulfonamide Derivatives Swelling    Tongue swelling    Consultations:    Procedures/Studies: CT Angio Chest Pulmonary Embolism (PE) W or WO Contrast  Result Date: 04/13/2022 CLINICAL DATA:  Pulmonary embolism (PE) suspected, high prob rales on exam bl. Neutropenic fever.  History of esophageal cancer, active chemotherapy. EXAM: CT ANGIOGRAPHY CHEST WITH CONTRAST TECHNIQUE: Multidetector CT imaging of the chest was performed using the standard protocol during bolus administration of intravenous contrast. Multiplanar CT image reconstructions and MIPs were obtained to evaluate the vascular anatomy. RADIATION DOSE REDUCTION: This exam was performed according to the departmental dose-optimization program which includes automated exposure control, adjustment of the mA and/or kV according to patient size and/or use of iterative reconstruction technique. CONTRAST:  81m OMNIPAQUE IOHEXOL 350 MG/ML SOLN COMPARISON:  Chest radiograph earlier today. CT 03/17/2022. PET CT 03/22/2022 FINDINGS: Cardiovascular: There are no filling defects within the pulmonary arteries to suggest pulmonary embolus. Mild aortic atherosclerosis, no aneurysm. There are coronary artery calcifications. Heart is normal in size. No pericardial effusion. Mediastinum/Nodes: There is wall thickening of the distal esophagus extending to the gastroesophageal junction consistent with known malignancy. Mild adjacent paraesophageal fat stranding. No paraesophageal collection or pneumomediastinum. Upper esophagus is minimally patulous. The paraesophageal lymph node on prior exam is  difficult to delineate from the esophageal wall thickening currently. Left upper paratracheal no new the thoracic inlet measures 14 mm, series 5, image 28, this previously measured 10 mm. 14 mm left upper paratracheal node series 5, image 52, previously 8 mm. There is a 13 mm right supraclavicular node, series 5, image 46, previously 11 mm. There are increased number of right upper paratracheal nodes from prior exam, that are subcentimeter. Lungs/Pleura: Mild breathing motion artifact limits detailed parenchymal assessment. Scattered areas of bandlike atelectasis. Heterogeneous pulmonary parenchyma. No confluent consolidation. No visible pulmonary nodule. No pleural effusion. Upper Abdomen: No acute findings. Musculoskeletal: There are no acute or suspicious osseous abnormalities. No chest wall soft tissue abnormalities. Review of the MIP images confirms the above findings. IMPRESSION: 1. No pulmonary embolus. 2. Heterogeneous pulmonary parenchyma may represent small airways disease. Scattered areas of bandlike atelectasis. No evidence of pneumonia. 3. Distal esophageal wall thickening extending to the gastroesophageal junction consistent with known malignancy. There is mild adjacent paraesophageal fat stranding. 4. Mediastinal adenopathy with slight increase size of multiple lymph nodes from prior exam. Attention on subsequent staging exams recommended. Aortic Atherosclerosis (ICD10-I70.0). Electronically Signed   By: MKeith RakeM.D.   On: 04/13/2022 23:25   DG Chest 2 View  Result Date: 04/13/2022 CLINICAL DATA:  Neutropenic fever. EXAM: CHEST - 2 VIEW COMPARISON:  10/30/2013 FINDINGS: Porta catheter on the right with tip at the SVC. Normal heart size and mediastinal contours. There is no edema, consolidation, effusion, or pneumothorax. IMPRESSION: Negative for pneumonia. Electronically Signed   By: JJorje GuildM.D.   On: 04/13/2022 11:00   MR Brain W Wo Contrast  Result Date: 04/03/2022 CLINICAL  DATA:  Esophageal cancer, intracranial staging EXAM: MRI HEAD WITHOUT AND WITH CONTRAST TECHNIQUE: Multiplanar, multiecho pulse sequences of the brain and surrounding structures were obtained without and with intravenous contrast. CONTRAST:  7.580mGADAVIST GADOBUTROL 1 MMOL/ML IV SOLN COMPARISON:  03/22/2022 PET-CT FINDINGS: Brain: No enhancement or swelling to suggest metastatic disease. Irregularly-shaped area of T2 hyperintensity in the right cerebral white matter with underlying developmental venous anomaly. No acute hemorrhage or swelling superimposed. No acute infarct, hydrocephalus, or collection. Vascular: Major flow voids and vascular enhancements are preserved Skull and upper cervical spine: Negative for marrow lesion. Sinuses/Orbits: Negative IMPRESSION: 1. Negative for metastatic disease. 2. 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 Electronically Signed   By: JoJorje Guild.D.   On:  04/03/2022 08:24   NM PET Image Initial (PI) Skull Base To Thigh  Result Date: 03/22/2022 CLINICAL DATA:  Initial treatment strategy for esophageal carcinoma. EXAM: NUCLEAR MEDICINE PET SKULL BASE TO THIGH TECHNIQUE: 8.6 mCi F-18 FDG was injected intravenously. Full-ring PET imaging was performed from the skull base to thigh after the radiotracer. CT data was obtained and used for attenuation correction and anatomic localization. Fasting blood glucose: 109 mg/dl COMPARISON:  CT on 03/17/2022 FINDINGS: Mediastinal blood-pool activity (background): SUV max = 2.2 Liver activity (reference): SUV max = N/A NECK: 1.1 cm left level 4 lower jugular lymph node on image 51/0 is hypermetabolic, with SUV max of 5.3. 1.1 cm right supraclavicular lymph node on image 25/8 is hypermetabolic, with SUV max of 5.1. Incidental CT findings:  None. CHEST: Concentric hypermetabolic wall thickening is seen involving the distal thoracic  esophagus and proximal stomach,, with SUV max of 11.0, consistent with known esophageal carcinoma. A 1 cm left paraesophageal lymph node is seen on image 111/2, which is hypermetabolic with SUV max of 3.9. No other hypermetabolic lymphadenopathy seen within the thorax. No suspicious pulmonary nodules seen on CT images. Incidental CT findings:  None. ABDOMEN/PELVIS: No abnormal hypermetabolic activity within the liver, pancreas, adrenal glands, or spleen. Mild hypermetabolic lymphadenopathy seen in the gastrohepatic ligament, which shows SUV max of 6.8. 11 mm lymph node in porta hepatis is also hypermetabolic, with SUV max of 5.9. 1.8 cm left paraaortic lymph node is hypermetabolic, with SUV max of 7.9. No hypermetabolic lymph nodes seen within the pelvis. Incidental CT findings:  None. SKELETON: No focal hypermetabolic bone lesions to suggest skeletal metastasis. Incidental CT findings:  None. IMPRESSION: Hypermetabolic wall thickening involving the distal thoracic esophagus and proximal stomach, consistent with known esophageal carcinoma. Hypermetabolic lymphadenopathy in the neck, chest, and abdomen as described above, consistent with metastatic disease. Electronically Signed   By: Marlaine Hind M.D.   On: 03/22/2022 13:50   PERIPHERAL VASCULAR CATHETERIZATION  Result Date: 03/21/2022 See surgical note for result.     Subjective: Feels well.  No chest pain, shortness of breath or dizziness  Discharge Exam: Vitals:   04/17/22 0036 04/17/22 0428  BP: (!) 96/46 (!) 102/52  Pulse: 74 72  Resp: 16 16  Temp: 98.7 F (37.1 C) 99.1 F (37.3 C)  SpO2: 92% 95%   Vitals:   04/16/22 1626 04/16/22 1957 04/17/22 0036 04/17/22 0428  BP: (!) 99/51 (!) 112/55 (!) 96/46 (!) 102/52  Pulse: 67 63 74 72  Resp:  '18 16 16  '$ Temp:  98.1 F (36.7 C) 98.7 F (37.1 C) 99.1 F (37.3 C)  TempSrc:  Oral Oral Oral  SpO2: 98% 96% 92% 95%  Weight:      Height:        General: Pt is alert, awake, not in acute  distress Cardiovascular: RRR, S1/S2 +, no rubs, no gallops Respiratory: CTA bilaterally, no wheezing, no rhonchi Abdominal: Soft, NT, ND, bowel sounds + Extremities: no edema, no cyanosis    The results of significant diagnostics from this hospitalization (including imaging, microbiology, ancillary and laboratory) are listed below for reference.     Microbiology: Recent Results (from the past 240 hour(s))  Culture, blood (routine x 2)     Status: None (Preliminary result)   Collection Time: 04/13/22 11:20 AM   Specimen: BLOOD  Result Value Ref Range Status   Specimen Description BLOOD RIGHT [facility] [place] [facility]  Final   Special Requests   Final    BOTTLES DRAWN AEROBIC  AND ANAEROBIC Blood Culture results may not be optimal due to an excessive volume of blood received in culture bottles   Culture   Final    NO GROWTH 4 DAYS Performed at [facility], [address] [place], [place] [address]    Report Status PENDING  Incomplete  Culture, blood (routine x 2)     Status: None (Preliminary result)   Collection Time: 04/13/22 11:28 AM   Specimen: BLOOD  Result Value Ref Range Status   Specimen Description BLOOD LEFT AC  Final   Special Requests   Final    BOTTLES DRAWN AEROBIC AND ANAEROBIC Blood Culture results may not be optimal due to an excessive volume of blood received in culture bottles   Culture   Final    NO GROWTH 4 DAYS Performed at [facility], [address] [place], [place] [address]    Report Status PENDING  Incomplete  Urine culture     Status: Abnormal   Collection Time: 04/13/22 11:33 AM   Specimen: Urine, Random  Result Value Ref Range Status   Specimen Description   Final    URINE, RANDOM Performed at [facility], [address] [place], [place] [address]    Special Requests   Final    NONE Performed at [facility], [address], [place], [place] [address]    Culture MULTIPLE SPECIES PRESENT, SUGGEST RECOLLECTION (A)  Final    Report Status 04/14/2022 FINAL  Final  Blood culture (routine x 2)     Status: None (Preliminary result)   Collection Time: 04/13/22  7:18 PM   Specimen: Right Antecubital; Blood  Result Value Ref Range Status   Specimen Description RIGHT ANTECUBITAL  Final   Special Requests   Final    BOTTLES DRAWN AEROBIC AND ANAEROBIC Blood Culture results may not be optimal due to an excessive volume of blood received in culture bottles   Culture   Final    NO GROWTH 4 DAYS Performed at [facility], [address] [place], [place] [address]    Report Status PENDING  Incomplete  Blood culture (routine x 2)     Status: None (Preliminary result)   Collection Time: 04/13/22  7:28 PM   Specimen: BLOOD  Result Value Ref Range Status   Specimen Description BLOOD LEFT ANTECUBITAL  Final   Special Requests   Final    BOTTLES DRAWN AEROBIC AND ANAEROBIC Blood Culture adequate volume   Culture   Final    NO GROWTH 4 DAYS Performed at [facility], [address] [place], [place] [address]    Report Status PENDING  Incomplete  Resp Panel by RT-PCR (Flu A&B, Covid) Anterior Nasal Swab     Status: None   Collection Time: 04/13/22  8:52 PM   Specimen: Anterior Nasal Swab  Result Value Ref Range Status   SARS Coronavirus 2 by RT PCR NEGATIVE NEGATIVE Final    Comment: (NOTE) SARS-CoV-2 target nucleic acids are NOT DETECTED.  The SARS-CoV-2 RNA is generally detectable in upper respiratory specimens during the acute phase of infection. The lowest concentration of SARS-CoV-2 viral copies this assay can detect is 138 copies/mL. A negative result does not preclude SARS-Cov-2 infection and should not be used as the sole basis for treatment or other patient management decisions. A negative result may occur with  improper specimen collection/handling, submission of specimen other than nasopharyngeal swab, presence of viral mutation(s) within the areas targeted by this assay, and  inadequate number of viral copies(<138 copies/mL). A negative result must be combined with clinical observations, patient history, and epidemiological information. The expected result is Negative.  Fact Sheet for Patients:  EntrepreneurPulse.com.au  Fact Sheet for Healthcare Providers:  IncredibleEmployment.be  This test is no t yet approved or cleared by the [place] FDA and  has been authorized for detection and/or diagnosis of SARS-CoV-2 by FDA under an Emergency Use Authorization (EUA). This EUA will remain  in effect (meaning this test can be used) for the duration of the COVID-19 declaration under Section 564(b)(1) of the Act, 21 U.S.C.section 360bbb-3(b)(1), unless the authorization is terminated  or revoked sooner.       Influenza A by PCR NEGATIVE NEGATIVE Final   Influenza B by PCR NEGATIVE NEGATIVE Final    Comment: (NOTE) The Xpert Xpress SARS-CoV-2/FLU/RSV plus assay is intended as an aid in the diagnosis of influenza from Nasopharyngeal swab specimens and should not be used as a sole basis for treatment. Nasal washings and aspirates are unacceptable for Xpert Xpress SARS-CoV-2/FLU/RSV testing.  Fact Sheet for Patients: EntrepreneurPulse.com.au  Fact Sheet for Healthcare Providers: IncredibleEmployment.be  This test is not yet approved or cleared by the [place] FDA and has been authorized for detection and/or diagnosis of SARS-CoV-2 by FDA under an Emergency Use Authorization (EUA). This EUA will remain in effect (meaning this test can be used) for the duration of the COVID-19 declaration under Section 564(b)(1) of the Act, 21 U.S.C. section 360bbb-3(b)(1), unless the authorization is terminated or revoked.  Performed at [facility], [address] [place], [place] [address]   Urine Culture     Status: Abnormal   Collection Time: 04/13/22  8:52 PM   Specimen:  In/Out Cath Urine  Result Value Ref Range Status   Specimen Description   Final    IN/OUT CATH URINE Performed at [facility], [address] [place], [place] [address]    Special Requests   Final    NONE Performed at [facility], [place], [place] [address]    Culture 4,000 COLONIES/mL STAPHYLOCOCCUS HAEMOLYTICUS (A)  Final   Report Status 04/16/2022 FINAL  Final   Organism ID, Bacteria STAPHYLOCOCCUS HAEMOLYTICUS (A)  Final      Susceptibility   Staphylococcus haemolyticus - MIC*    CIPROFLOXACIN >=8 RESISTANT Resistant     GENTAMICIN <=0.5 SENSITIVE Sensitive     NITROFURANTOIN <=16 SENSITIVE Sensitive     OXACILLIN >=4 RESISTANT Resistant     TETRACYCLINE <=1 SENSITIVE Sensitive     VANCOMYCIN <=0.5 SENSITIVE Sensitive     TRIMETH/SULFA <=10 SENSITIVE Sensitive     CLINDAMYCIN >=8 RESISTANT Resistant     RIFAMPIN <=0.5 SENSITIVE Sensitive     Inducible Clindamycin NEGATIVE Sensitive     * 4,000 COLONIES/mL STAPHYLOCOCCUS HAEMOLYTICUS  MRSA Next Gen by PCR, Nasal     Status: None   Collection Time: 04/14/22 12:22 AM   Specimen: Nasal Mucosa; Nasal Swab  Result Value Ref Range Status   MRSA by PCR Next Gen NOT DETECTED NOT DETECTED Final    Comment: (NOTE) The GeneXpert MRSA Assay (FDA approved for NASAL specimens only), is one component of a comprehensive MRSA colonization surveillance program. It is not intended to diagnose MRSA infection nor to guide or monitor treatment for MRSA infections. Test performance is not FDA approved in patients less than 33 years old. Performed at [facility], [address] [place], [place] [address]  Labs: BNP (last 3 results) Recent Labs    04/13/22 2341  BNP 517.6*   Basic Metabolic Panel: Recent Labs  Lab 04/13/22 1119 04/13/22 1919 04/15/22 0621 04/17/22 0610  NA 134* 134* 137  --   K 3.8 3.9 3.2* 3.4*  CL 103 104 107  --   CO2 '24 24 22  '$ --   GLUCOSE 99 115* 96  --    BUN '13 14 11  '$ --   CREATININE 0.83 0.87 0.61 0.92  CALCIUM 8.4* 8.0* 8.2*  --   MG 2.3  --  1.7 2.1  PHOS  --   --  2.7  --    Liver Function Tests: Recent Labs  Lab 04/13/22 1119 04/13/22 1919 04/15/22 0621  AST 11* 11* 15  ALT '11 10 11  '$ ALKPHOS 64 65 54  BILITOT 0.6 0.5 1.7*  PROT 6.7 6.4* 5.8*  ALBUMIN 3.5 3.4* 2.6*   No results for input(s): "LIPASE", "AMYLASE" in the last 168 hours. No results for input(s): "AMMONIA" in the last 168 hours. CBC: Recent Labs  Lab 04/13/22 1119 04/13/22 1919 04/14/22 0617 04/14/22 2023 04/15/22 0621 04/16/22 0933 04/17/22 0610  WBC 1.1* 1.1* 1.1*  --  2.8*  --  29.2*  NEUTROABS 0.0* 0.1* 0.0*  --  0.5*  --  17.4*  HGB 7.5* 7.1* 6.6* 7.5* 7.7* 7.8* 7.5*  HCT 23.2* 22.4* 21.0* 22.9* 23.3* 24.2* 23.5*  MCV 90.6 91.4 91.7  --  86.3  --  90.0  PLT 191 171 168  --  231  --  287   Cardiac Enzymes: No results for input(s): "CKTOTAL", "CKMB", "CKMBINDEX", "TROPONINI" in the last 168 hours. BNP: Invalid input(s): "POCBNP" CBG: Recent Labs  Lab 04/14/22 0023  GLUCAP 124*   D-Dimer No results for input(s): "DDIMER" in the last 72 hours. Hgb A1c No results for input(s): "HGBA1C" in the last 72 hours. Lipid Profile No results for input(s): "CHOL", "HDL", "LDLCALC", "TRIG", "CHOLHDL", "LDLDIRECT" in the last 72 hours. Thyroid function studies No results for input(s): "TSH", "T4TOTAL", "T3FREE", "THYROIDAB" in the last 72 hours.  Invalid input(s): "FREET3" Anemia work up No results for input(s): "VITAMINB12", "FOLATE", "FERRITIN", "TIBC", "IRON", "RETICCTPCT" in the last 72 hours. Urinalysis    Component Value Date/Time   COLORURINE COLORLESS (A) 04/13/2022 2052   APPEARANCEUR CLEAR (A) 04/13/2022 2052   LABSPEC 1.005 04/13/2022 2052   PHURINE 7.0 04/13/2022 2052   GLUCOSEU NEGATIVE 04/13/2022 2052   HGBUR NEGATIVE 04/13/2022 2052   BILIRUBINUR NEGATIVE 04/13/2022 2052   KETONESUR NEGATIVE 04/13/2022 2052   PROTEINUR  NEGATIVE 04/13/2022 2052   NITRITE NEGATIVE 04/13/2022 2052   LEUKOCYTESUR NEGATIVE 04/13/2022 2052   Sepsis Labs Recent Labs  Lab 04/13/22 1919 04/14/22 0617 04/15/22 0621 04/17/22 0610  WBC 1.1* 1.1* 2.8* 29.2*   Microbiology Recent Results (from the past 240 hour(s))  Culture, blood (routine x 2)     Status: None (Preliminary result)   Collection Time: 04/13/22 11:20 AM   Specimen: BLOOD  Result Value Ref Range Status   Specimen Description BLOOD RIGHT AC  Final   Special Requests   Final    BOTTLES DRAWN AEROBIC AND ANAEROBIC Blood Culture results may not be optimal due to an excessive volume of blood received in culture bottles   Culture   Final    NO GROWTH 4 DAYS Performed at [facility], [address] [place], [place] [address]    Report Status PENDING  Incomplete  Culture, blood (routine x  2)     Status: None (Preliminary result)   Collection Time: 04/13/22 11:28 AM   Specimen: BLOOD  Result Value Ref Range Status   Specimen Description BLOOD LEFT AC  Final   Special Requests   Final    BOTTLES DRAWN AEROBIC AND ANAEROBIC Blood Culture results may not be optimal due to an excessive volume of blood received in culture bottles   Culture   Final    NO GROWTH 4 DAYS Performed at [facility], [address] [place], [place] [address]    Report Status PENDING  Incomplete  Urine culture     Status: Abnormal   Collection Time: 04/13/22 11:33 AM   Specimen: Urine, Random  Result Value Ref Range Status   Specimen Description   Final    URINE, RANDOM Performed at [facility], [address], [place], [place] [address]    Special Requests   Final    NONE Performed at [facility], [address], [place], [place] [address]    Culture MULTIPLE SPECIES PRESENT, SUGGEST RECOLLECTION (A)  Final   Report Status 04/14/2022 FINAL  Final  Blood culture (routine x 2)     Status: None (Preliminary result)   Collection Time: 04/13/22   7:18 PM   Specimen: Right Antecubital; Blood  Result Value Ref Range Status   Specimen Description RIGHT ANTECUBITAL  Final   Special Requests   Final    BOTTLES DRAWN AEROBIC AND ANAEROBIC Blood Culture results may not be optimal due to an excessive volume of blood received in culture bottles   Culture   Final    NO GROWTH 4 DAYS Performed at [facility], [address] [place], [place] [address]    Report Status PENDING  Incomplete  Blood culture (routine x 2)     Status: None (Preliminary result)   Collection Time: 04/13/22  7:28 PM   Specimen: BLOOD  Result Value Ref Range Status   Specimen Description BLOOD LEFT ANTECUBITAL  Final   Special Requests   Final    BOTTLES DRAWN AEROBIC AND ANAEROBIC Blood Culture adequate volume   Culture   Final    NO GROWTH 4 DAYS Performed at [facility], [address] [place], [place] [address]    Report Status PENDING  Incomplete  Resp Panel by RT-PCR (Flu A&B, Covid) Anterior Nasal Swab     Status: None   Collection Time: 04/13/22  8:52 PM   Specimen: Anterior Nasal Swab  Result Value Ref Range Status   SARS Coronavirus 2 by RT PCR NEGATIVE NEGATIVE Final    Comment: (NOTE) SARS-CoV-2 target nucleic acids are NOT DETECTED.  The SARS-CoV-2 RNA is generally detectable in upper respiratory specimens during the acute phase of infection. The lowest concentration of SARS-CoV-2 viral copies this assay can detect is 138 copies/mL. A negative result does not preclude SARS-Cov-2 infection and should not be used as the sole basis for treatment or other patient management decisions. A negative result may occur with  improper specimen collection/handling, submission of specimen other than nasopharyngeal swab, presence of viral mutation(s) within the areas targeted by this assay, and inadequate number of viral copies(<138 copies/mL). A negative result must be combined with clinical observations, patient history, and  epidemiological information. The expected result is Negative.  Fact Sheet for Patients:  EntrepreneurPulse.com.au  Fact Sheet for Healthcare Providers:  IncredibleEmployment.be  This test is no t yet approved or cleared by the [place] and  has been authorized for detection and/or diagnosis of SARS-CoV-2 by FDA under an Emergency Use Authorization (EUA). This EUA will remain  in effect (meaning this test can be used) for the duration of the COVID-19 declaration under Section 564(b)(1) of the Act, 21 U.S.C.section 360bbb-3(b)(1), unless the authorization is terminated  or revoked sooner.       Influenza A by PCR NEGATIVE NEGATIVE Final   Influenza B by PCR NEGATIVE NEGATIVE Final    Comment: (NOTE) The Xpert Xpress SARS-CoV-2/FLU/RSV plus assay is intended as an aid in the diagnosis of influenza from Nasopharyngeal swab specimens and should not be used as a sole basis for treatment. Nasal washings and aspirates are unacceptable for Xpert Xpress SARS-CoV-2/FLU/RSV testing.  Fact Sheet for Patients: EntrepreneurPulse.com.au  Fact Sheet for Healthcare Providers: IncredibleEmployment.be  This test is not yet approved or cleared by the [place] FDA and has been authorized for detection and/or diagnosis of SARS-CoV-2 by FDA under an Emergency Use Authorization (EUA). This EUA will remain in effect (meaning this test can be used) for the duration of the COVID-19 declaration under Section 564(b)(1) of the Act, 21 U.S.C. section 360bbb-3(b)(1), unless the authorization is terminated or revoked.  Performed at [facility], [address] [place], [place] [address]   Urine Culture     Status: Abnormal   Collection Time: 04/13/22  8:52 PM   Specimen: In/Out Cath Urine  Result Value Ref Range Status   Specimen Description   Final    IN/OUT CATH URINE Performed at [facility],  [address] [place], [place] [address]    Special Requests   Final    NONE Performed at [facility], [place], [place] [address]    Culture 4,000 COLONIES/mL STAPHYLOCOCCUS HAEMOLYTICUS (A)  Final   Report Status 04/16/2022 FINAL  Final   Organism ID, Bacteria STAPHYLOCOCCUS HAEMOLYTICUS (A)  Final      Susceptibility   Staphylococcus haemolyticus - MIC*    CIPROFLOXACIN >=8 RESISTANT Resistant     GENTAMICIN <=0.5 SENSITIVE Sensitive     NITROFURANTOIN <=16 SENSITIVE Sensitive     OXACILLIN >=4 RESISTANT Resistant     TETRACYCLINE <=1 SENSITIVE Sensitive     VANCOMYCIN <=0.5 SENSITIVE Sensitive     TRIMETH/SULFA <=10 SENSITIVE Sensitive     CLINDAMYCIN >=8 RESISTANT Resistant     RIFAMPIN <=0.5 SENSITIVE Sensitive     Inducible Clindamycin NEGATIVE Sensitive     * 4,000 COLONIES/mL STAPHYLOCOCCUS HAEMOLYTICUS  MRSA Next Gen by PCR, Nasal     Status: None   Collection Time: 04/14/22 12:22 AM   Specimen: Nasal Mucosa; Nasal Swab  Result Value Ref Range Status   MRSA by PCR Next Gen NOT DETECTED NOT DETECTED Final    Comment: (NOTE) The GeneXpert MRSA Assay (FDA approved for NASAL specimens only), is one component of a comprehensive MRSA colonization surveillance program. It is not intended to diagnose MRSA infection nor to guide or monitor treatment for MRSA infections. Test performance is not FDA approved in patients less than 28 years old. Performed at [facility], [address] [place], [place] [address]      Time coordinating discharge: Over 30 minutes  SIGNED:   Nolberto Hanlon, MD  [facility] 04/17/2022, 2:43 PM Pager   If 7PM-7AM, please contact night-coverage [email or web address] Password [email or web address]

## 2022-04-17 NOTE — Telephone Encounter (Signed)
Patient discharged-admitted for neutropenic fever.   Please have the patient follow-up on 9 th or 10th- APP; labs CBC CMP.   Thanks GB

## 2022-04-17 NOTE — Plan of Care (Signed)

## 2022-04-17 NOTE — Progress Notes (Signed)
Patient is alert and oriented x4. Denied pain. Got nausea med on day shift that worked. Took night meds w/o nausea. Independent. R-chest port not accessed-IV team attempted access previously on another shift but was unable to obtain access. Denied additional needs. Receives Cefipime and Vancomycin IV. Highest temp was 99.1 orally. Bp's have been soft-consulted with provider to hold lopressor for night shift-will pass on to day team. Denies additional needs.

## 2022-04-18 LAB — CULTURE, BLOOD (ROUTINE X 2)
Culture: NO GROWTH
Culture: NO GROWTH
Culture: NO GROWTH
Culture: NO GROWTH
Special Requests: ADEQUATE

## 2022-04-18 NOTE — Telephone Encounter (Signed)
Pamala Hurry - can you call patient and change this apt to an in person apt tomorrow earlier than sch. Apt time instead of a phone call with Josh. She will need a port lab- [facility] and [place] infusion time. Thanks.  (Dr. B wants pt to have labs)

## 2022-04-19 ENCOUNTER — Inpatient Hospital Stay: Payer: BC Managed Care – PPO | Admitting: Hospice and Palliative Medicine

## 2022-04-19 ENCOUNTER — Encounter: Payer: Self-pay | Admitting: Oncology

## 2022-04-19 ENCOUNTER — Other Ambulatory Visit: Payer: Self-pay

## 2022-04-19 ENCOUNTER — Inpatient Hospital Stay: Payer: BC Managed Care – PPO | Admitting: Medical Oncology

## 2022-04-19 ENCOUNTER — Inpatient Hospital Stay: Payer: BC Managed Care – PPO

## 2022-04-19 ENCOUNTER — Encounter: Payer: Self-pay | Admitting: Hospice and Palliative Medicine

## 2022-04-19 ENCOUNTER — Inpatient Hospital Stay (HOSPITAL_BASED_OUTPATIENT_CLINIC_OR_DEPARTMENT_OTHER): Payer: BC Managed Care – PPO | Admitting: Hospice and Palliative Medicine

## 2022-04-19 DIAGNOSIS — D709 Neutropenia, unspecified: Secondary | ICD-10-CM | POA: Diagnosis not present

## 2022-04-19 DIAGNOSIS — Z95828 Presence of other vascular implants and grafts: Secondary | ICD-10-CM

## 2022-04-19 DIAGNOSIS — R5081 Fever presenting with conditions classified elsewhere: Secondary | ICD-10-CM | POA: Diagnosis not present

## 2022-04-19 DIAGNOSIS — C159 Malignant neoplasm of esophagus, unspecified: Secondary | ICD-10-CM

## 2022-04-19 DIAGNOSIS — Z5112 Encounter for antineoplastic immunotherapy: Secondary | ICD-10-CM | POA: Diagnosis not present

## 2022-04-19 LAB — COMPREHENSIVE METABOLIC PANEL
ALT: 14 U/L (ref 0–44)
AST: 20 U/L (ref 15–41)
Albumin: 3.2 g/dL — ABNORMAL LOW (ref 3.5–5.0)
Alkaline Phosphatase: 89 U/L (ref 38–126)
Anion gap: 9 (ref 5–15)
BUN: 26 mg/dL — ABNORMAL HIGH (ref 8–23)
CO2: 27 mmol/L (ref 22–32)
Calcium: 8.7 mg/dL — ABNORMAL LOW (ref 8.9–10.3)
Chloride: 100 mmol/L (ref 98–111)
Creatinine, Ser: 0.69 mg/dL (ref 0.44–1.00)
GFR, Estimated: >60 mL/min (ref >60)
Glucose, Bld: 98 mg/dL (ref 70–99)
Potassium: 3.8 mmol/L (ref 3.5–5.1)
Sodium: 136 mmol/L (ref 135–145)
Total Bilirubin: 0.2 mg/dL — ABNORMAL LOW (ref 0.3–1.2)
Total Protein: 6.4 g/dL — ABNORMAL LOW (ref 6.5–8.1)

## 2022-04-19 LAB — CBC WITH DIFFERENTIAL/PLATELET
Abs Immature Granulocytes: 5.03 10*3/uL — ABNORMAL HIGH (ref 0.00–0.07)
Basophils Absolute: 0.1 10*3/uL (ref 0.0–0.1)
Basophils Relative: 0 %
Eosinophils Absolute: 0 10*3/uL (ref 0.0–0.5)
Eosinophils Relative: 0 %
HCT: 25.6 % — ABNORMAL LOW (ref 36.0–46.0)
Hemoglobin: 8.3 g/dL — ABNORMAL LOW (ref 12.0–15.0)
Immature Granulocytes: 17 %
Lymphocytes Relative: 14 %
Lymphs Abs: 4.1 10*3/uL — ABNORMAL HIGH (ref 0.7–4.0)
MCH: 29.7 pg (ref 26.0–34.0)
MCHC: 32.4 g/dL (ref 30.0–36.0)
MCV: 91.8 fL (ref 80.0–100.0)
Monocytes Absolute: 3.1 10*3/uL — ABNORMAL HIGH (ref 0.1–1.0)
Monocytes Relative: 10 %
Neutro Abs: 17.8 10*3/uL — ABNORMAL HIGH (ref 1.7–7.7)
Neutrophils Relative %: 59 %
Platelets: 382 10*3/uL (ref 150–400)
RBC: 2.79 MIL/uL — ABNORMAL LOW (ref 3.87–5.11)
RDW: 16.8 % — ABNORMAL HIGH (ref 11.5–15.5)
Smear Review: NORMAL
WBC: 30.2 10*3/uL — ABNORMAL HIGH (ref 4.0–10.5)
nRBC: 2.4 % — ABNORMAL HIGH (ref 0.0–0.2)

## 2022-04-19 MED ORDER — LEVOFLOXACIN 500 MG PO TABS: 500.0000 mg | ORAL_TABLET | Freq: Every day | ORAL | 0 refills | Status: DC

## 2022-04-19 MED ORDER — HEPARIN SOD (PORK) LOCK FLUSH 100 UNIT/ML IV SOLN
500.0000 [IU] | Freq: Once | INTRAVENOUS | Status: AC
  Administered 2022-04-19: 500 [IU]
  Filled 2022-04-19: qty 5

## 2022-04-19 MED ORDER — FLUCONAZOLE 150 MG PO TABS
150.0000 mg | ORAL_TABLET | ORAL | 0 refills | Status: DC
Start: 2022-04-19 — End: 2022-05-05

## 2022-04-19 MED ORDER — SODIUM CHLORIDE 0.9% FLUSH
10.0000 mL | Freq: Once | INTRAVENOUS | Status: AC
  Administered 2022-04-19: 10 mL via INTRAVENOUS
  Filled 2022-04-19: qty 10

## 2022-04-19 NOTE — Progress Notes (Signed)
Patient inquiring about the staying on levaquin and midodrine

## 2022-04-19 NOTE — Patient Instructions (Signed)
PLAN: -Continue p.o. Levaquin (7 days) -Fluconazole 150 mg every 72 hours x 2 doses (hold simvastatin) -RTC next week as previously scheduled

## 2022-04-19 NOTE — Progress Notes (Signed)
Symptom Management and [place] at [facility] [phone] [phone]) [phone]  Patient Care Team: Rusty Aus, MD as PCP - General (Internal Medicine) Clent Jacks, RN as Oncology Nurse Navigator   NAME OF PATIENT: Ariana Wilson  [id number]  December 11, 1960   DATE OF VISIT: 04/19/22  REASON FOR CONSULT: Ariana Wilson is a 61 y.o. 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  She was diagnosed with cancer in June 2023.  Patient is on treatment with cisplatin etoposide chemotherapy.  Patient received day 1, cycle 1 cisplatin/etoposide on 03/30/2022 and completed day 3 etoposide on 04/01/2022.  She was hospitalized 04/13/2022 to 04/17/2022 with neutropenic fever.  Patient was found to have hemoglobin of 6.6 and was transfused with irradiated PRBC.  This hospitalization was complicated by A-fib with RVR and persistent hypotension.  Patient was started on midodrine.  Urine culture positive for staph hemolyticus.  INTERVAL HISTORY: Patient presents to clinic for post hospital follow-up.  Patient reports that she is doing well.  She denies any significant changes or concerns since discharging her from the hospital.  No symptomatic complaints at present other than she thinks she is developing a vaginal yeast infection.  She has had many of those in the past.  No fever or chills.  No nausea or vomiting.  Appetite is stable.  Patient is monitoring home blood pressure and asks about continued midodrine dosing.   SOCIAL HISTORY:     reports that she has never smoked. She has never used smokeless tobacco. She reports that she does not drink alcohol and does not use drugs.  Patient is married and lives at home with her husband.  She is retired Pharmacist, hospital.  ADVANCE DIRECTIVES:    CODE STATUS:    PAST MEDICAL HISTORY: Past Medical History:  Diagnosis Date   Anemia    Arthritis     Atrial fibrillation ([place])    B12 deficiency    Dysrhythmia    Hyperlipidemia    Hypothyroidism    Menopause    Palpitations    Hx of, 25 years ago   Vertigo     PAST SURGICAL HISTORY:  Past Surgical History:  Procedure Laterality Date   BILATERAL SALPINGOOPHORECTOMY  2008   DILATION AND CURETTAGE OF UTERUS  08/24/2006   ESOPHAGOGASTRODUODENOSCOPY (EGD) WITH PROPOFOL N/A 03/01/2022   Procedure: ESOPHAGOGASTRODUODENOSCOPY (EGD) WITH PROPOFOL;  Surgeon: Lesly Rubenstein, MD;  Location: [facility] ENDOSCOPY;  Service: Endoscopy;  Laterality: N/A;   incision tendon sheath for trigger finger Right    ring finger   LAPAROSCOPIC SUPRACERVICAL HYSTERECTOMY  2008   Dr. 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  MEDICATIONS:  Current Outpatient Medications  Medication Sig Dispense Refill   Calcium Carbonate-Vitamin D 600-200 MG-UNIT TABS Take 1 tablet by mouth daily.       cetirizine (ZYRTEC) 10 MG tablet Take 10 mg by mouth daily.     Cholecalciferol 25 MCG (1000 UT) tablet Take 1,000 Units by mouth daily.     DHEA 25  MG tablet Take 25 mg by mouth daily.     diclofenac Sodium (VOLTAREN) 1 % GEL Apply 2 g topically 4 (four) times daily.     flecainide (TAMBOCOR) 50 MG tablet Take 1 tablet by mouth 2 (two) times daily.     levofloxacin (LEVAQUIN) 500 MG tablet Take 1 tablet (500 mg total) by mouth daily. 5 tablet 0   lidocaine-prilocaine (EMLA) cream Apply to affected area once 30 g 3   LORazepam (ATIVAN) 0.5 MG tablet Take 1 tablet (0.5 mg total) by mouth every 6 (six) hours as needed (Nausea or vomiting). 30 tablet 0   midodrine (PROAMATINE) 10 MG tablet Take 1 tablet (10 mg total) by mouth 3 (three) times daily with meals. 90 tablet 0   mometasone (NASONEX) 50 MCG/ACT nasal spray Place 2 sprays into the nose daily.     pantoprazole (PROTONIX) 40 MG tablet Take 40 mg by mouth daily.     Saccharomyces boulardii (PROBIOTIC) 250 MG CAPS Take 1 capsule by mouth daily.     simvastatin (ZOCOR) 20 MG tablet TAKE 1 TABLET BY MOUTH EVERY EVENING. 30 tablet 0   vitamin B-12 (CYANOCOBALAMIN) 1000 MCG tablet Take 1,000 mcg by mouth daily.     Zinc Citrate-Phytase (ZYTAZE) 25-500 MG CAPS Take 1 Dose by mouth daily.     dexamethasone (DECADRON) 4 MG tablet Pt has 3 day chemo regimen and will need to take 2 tablets ( '8mg'$ ) on day 4 for each cycle of chemo. Make sure to eat food in the am and then take the med (Patient not taking: Reported on 04/19/2022) 30 tablet 1   metoprolol succinate (TOPROL-XL) 25 MG 24 hr tablet Take 0.5 tablets (12.5 mg total) by mouth 2 (two) times daily. (Patient not taking: Reported on 04/19/2022) 30 tablet 0   prochlorperazine (COMPAZINE) 10 MG tablet Take 1 tablet (10 mg total) by mouth every 6 (six) hours as needed (Nausea or vomiting). (Patient not taking: Reported on 03/29/2022) 30 tablet 1   No current facility-administered medications for this visit.    VITAL SIGNS: BP (!) 120/59   Pulse 70   Temp (!) 96.4 F (35.8 C) (Tympanic)   Ht '5\' 1"'$  (1.549 m)   Wt 159 lb 6.4 oz (72.3 kg)   BMI 30.12  kg/m  Filed Weights   04/19/22 0904  Weight: 159 lb 6.4 oz (72.3 kg)    Estimated body mass index is 30.12 kg/m as calculated from the following:   Height as of this encounter: '5\' 1"'$  (1.549 m).   Weight as of this encounter: 159 lb 6.4 oz (72.3 kg).  LABS: CBC:    Component Value Date/Time   WBC 29.2 (H) 04/17/2022 0610   HGB 7.5 (L) 04/17/2022 0610   HCT 23.5 (L) 04/17/2022 0610   PLT 287 04/17/2022 0610   MCV 90.0 04/17/2022 0610   NEUTROABS 17.4 (H) 04/17/2022 0610   LYMPHSABS 3.2 04/17/2022 0610   MONOABS 3.3 (H) 04/17/2022 0610   EOSABS 0.0 04/17/2022 0610   BASOSABS 0.1 04/17/2022 0610   Comprehensive  Metabolic Panel:    Component Value Date/Time   NA 137 04/15/2022 0621   K 3.4 (L) 04/17/2022 0610   CL 107 04/15/2022 0621   CO2 22 04/15/2022 0621   BUN 11 04/15/2022 0621   CREATININE 0.92 04/17/2022 0610   GLUCOSE 96 04/15/2022 0621   CALCIUM 8.2 (L) 04/15/2022 0621   AST 15 04/15/2022 0621   ALT 11 04/15/2022 0621   ALKPHOS 54 04/15/2022 0621   BILITOT 1.7 (H) 04/15/2022 0621   BILITOT 0.6 03/06/2015 0910   PROT 5.8 (L) 04/15/2022 0621   PROT 6.6 03/06/2015 0910   ALBUMIN 2.6 (L) 04/15/2022 0621   ALBUMIN 4.6 03/06/2015 0910    RADIOGRAPHIC STUDIES: CT Angio Chest Pulmonary Embolism (PE) W or WO Contrast  Result Date: 04/13/2022 CLINICAL DATA:  Pulmonary embolism (PE) suspected, high prob rales on exam bl. Neutropenic fever. History of esophageal cancer, active chemotherapy. EXAM: CT ANGIOGRAPHY CHEST WITH CONTRAST TECHNIQUE: Multidetector CT imaging of the chest was performed using the standard protocol during bolus administration of intravenous contrast. Multiplanar CT image reconstructions and MIPs were obtained to evaluate the vascular anatomy. RADIATION DOSE REDUCTION: This exam was performed according to the departmental dose-optimization program which includes automated exposure control, adjustment of the mA and/or kV according to patient size and/or  use of iterative reconstruction technique. CONTRAST:  17m OMNIPAQUE IOHEXOL 350 MG/ML SOLN COMPARISON:  Chest radiograph earlier today. CT 03/17/2022. PET CT 03/22/2022 FINDINGS: Cardiovascular: There are no filling defects within the pulmonary arteries to suggest pulmonary embolus. Mild aortic atherosclerosis, no aneurysm. There are coronary artery calcifications. Heart is normal in size. No pericardial effusion. Mediastinum/Nodes: There is wall thickening of the distal esophagus extending to the gastroesophageal junction consistent with known malignancy. Mild adjacent paraesophageal fat stranding. No paraesophageal collection or pneumomediastinum. Upper esophagus is minimally patulous. The paraesophageal lymph node on prior exam is difficult to delineate from the esophageal wall thickening currently. Left upper paratracheal no new the thoracic inlet measures 14 mm, series 5, image 28, this previously measured 10 mm. 14 mm left upper paratracheal node series 5, image 52, previously 8 mm. There is a 13 mm right supraclavicular node, series 5, image 46, previously 11 mm. There are increased number of right upper paratracheal nodes from prior exam, that are subcentimeter. Lungs/Pleura: Mild breathing motion artifact limits detailed parenchymal assessment. Scattered areas of bandlike atelectasis. Heterogeneous pulmonary parenchyma. No confluent consolidation. No visible pulmonary nodule. No pleural effusion. Upper Abdomen: No acute findings. Musculoskeletal: There are no acute or suspicious osseous abnormalities. No chest wall soft tissue abnormalities. Review of the MIP images confirms the above findings. IMPRESSION: 1. No pulmonary embolus. 2. Heterogeneous pulmonary parenchyma may represent small airways disease. Scattered areas of bandlike atelectasis. No evidence of pneumonia. 3. Distal esophageal wall thickening extending to the gastroesophageal junction consistent with known malignancy. There is mild adjacent  paraesophageal fat stranding. 4. Mediastinal adenopathy with slight increase size of multiple lymph nodes from prior exam. Attention on subsequent staging exams recommended. Aortic Atherosclerosis (ICD10-I70.0). Electronically Signed   By: MKeith RakeM.D.   On: 04/13/2022 23:25   DG Chest 2 View  Result Date: 04/13/2022 CLINICAL DATA:  Neutropenic fever. EXAM: CHEST - 2 VIEW COMPARISON:  10/30/2013 FINDINGS: Porta catheter on the right with tip at the SVC. Normal heart size and mediastinal contours. There is no edema, consolidation, effusion, or pneumothorax. IMPRESSION: Negative for pneumonia. Electronically Signed   By: JJorje GuildM.D.   On: 04/13/2022 11:00   MR  Brain W Wo Contrast  Result Date: 04/03/2022 CLINICAL DATA:  Esophageal cancer, intracranial staging EXAM: MRI HEAD WITHOUT AND WITH CONTRAST TECHNIQUE: Multiplanar, multiecho pulse sequences of the brain and surrounding structures were obtained without and with intravenous contrast. CONTRAST:  7.33m GADAVIST GADOBUTROL 1 MMOL/ML IV SOLN COMPARISON:  03/22/2022 PET-CT FINDINGS: Brain: No enhancement or swelling to suggest metastatic disease. Irregularly-shaped area of T2 hyperintensity in the right cerebral white matter with underlying developmental venous anomaly. No acute hemorrhage or swelling superimposed. No acute infarct, hydrocephalus, or collection. Vascular: Major flow voids and vascular enhancements are preserved Skull and upper cervical spine: Negative for marrow lesion. Sinuses/Orbits: Negative IMPRESSION: 1. Negative for metastatic disease. 2. 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 Electronically Signed   By: JJorje GuildM.D.   On: 04/03/2022 08:24   NM PET Image Initial (PI) Skull Base To Thigh  Result Date: 03/22/2022 CLINICAL DATA:  Initial treatment strategy for esophageal carcinoma. EXAM: NUCLEAR MEDICINE  PET SKULL BASE TO THIGH TECHNIQUE: 8.6 mCi F-18 FDG was injected intravenously. Full-ring PET imaging was performed from the skull base to thigh after the radiotracer. CT data was obtained and used for attenuation correction and anatomic localization. Fasting blood glucose: 109 mg/dl COMPARISON:  CT on 03/17/2022 FINDINGS: Mediastinal blood-pool activity (background): SUV max = 2.2 Liver activity (reference): SUV max = N/A NECK: 1.1 cm left level 4 lower jugular lymph node on image 563/1is hypermetabolic, with SUV max of 5.3. 1.1 cm right supraclavicular lymph node on image 649/7is hypermetabolic, with SUV max of 5.1. Incidental CT findings:  None. CHEST: Concentric hypermetabolic wall thickening is seen involving the distal thoracic esophagus and proximal stomach,, with SUV max of 11.0, consistent with known esophageal carcinoma. A 1 cm left paraesophageal lymph node is seen on image 111/2, which is hypermetabolic with SUV max of 3.9. No other hypermetabolic lymphadenopathy seen within the thorax. No suspicious pulmonary nodules seen on CT images. Incidental CT findings:  None. ABDOMEN/PELVIS: No abnormal hypermetabolic activity within the liver, pancreas, adrenal glands, or spleen. Mild hypermetabolic lymphadenopathy seen in the gastrohepatic ligament, which shows SUV max of 6.8. 11 mm lymph node in porta hepatis is also hypermetabolic, with SUV max of 5.9. 1.8 cm left paraaortic lymph node is hypermetabolic, with SUV max of 7.9. No hypermetabolic lymph nodes seen within the pelvis. Incidental CT findings:  None. SKELETON: No focal hypermetabolic bone lesions to suggest skeletal metastasis. Incidental CT findings:  None. IMPRESSION: Hypermetabolic wall thickening involving the distal thoracic esophagus and proximal stomach, consistent with known esophageal carcinoma. Hypermetabolic lymphadenopathy in the neck, chest, and abdomen as described above, consistent with metastatic disease. Electronically Signed   By:  JMarlaine HindM.D.   On: 03/22/2022 13:50   PERIPHERAL VASCULAR CATHETERIZATION  Result Date: 03/21/2022 See surgical note for result.   PERFORMANCE STATUS (ECOG) : 1 - Symptomatic but completely ambulatory  Review of Systems Unless otherwise noted, a complete review of systems is negative.  Physical Exam General: NAD Cardiovascular: regular rate and rhythm Pulmonary: clear ant fields Abdomen: soft, nontender, + bowel sounds GU: no suprapubic tenderness Extremities: no edema, no joint deformities Skin: no rashes Neurological: Grossly nonfocal  IMPRESSION: Labs stable.  Exam stable.  Patient reports early development of vaginal yeast.  We will start empiric treatment.  Hospital discharge summary reviewed and it appears that patient was supposed to continue Levaquin for 7 days but she does not  have enough supply at home.  We will refill.  Patient to monitor home BP.  I would like for her to follow-up with Dr. Sabra Heck regarding dosing of cardiac meds.  PLAN: -Continue p.o. Levaquin (7 days) -Fluconazole 150 mg every 72 hours x 2 doses (hold simvastatin) -RTC next week as previously scheduled   Patient expressed understanding and was in agreement with this plan. She also understands that She can call clinic at any time with any questions, concerns, or complaints.   Thank you for allowing me to participate in the care of this very pleasant patient.   Time Total: 20 minutes  Visit consisted of counseling and education dealing with the complex and emotionally intense issues of symptom management in the setting of serious illness.Greater than 50%  of this time was spent counseling and coordinating care related to the above assessment and plan.  Signed by: Altha Harm, PhD, NP-C

## 2022-04-20 ENCOUNTER — Inpatient Hospital Stay: Payer: BC Managed Care – PPO

## 2022-04-20 ENCOUNTER — Inpatient Hospital Stay: Payer: BC Managed Care – PPO | Admitting: Licensed Clinical Social Worker

## 2022-04-20 DIAGNOSIS — C155 Malignant neoplasm of lower third of esophagus: Secondary | ICD-10-CM

## 2022-04-20 NOTE — Progress Notes (Signed)
[place] [facility]  Initial Assessment   Ariana Wilson is a 61 y.o. year old female contacted by phone. Clinical Social Work was referred by medical provider for assessment of psychosocial needs.   SDOH (Social Determinants of Health) assessments performed: Yes SDOH Interventions    Flowsheet Row Most Recent Value  SDOH Interventions   Food Insecurity Interventions Intervention Not Indicated  Financial Strain Interventions Intervention Not Indicated  Housing Interventions Intervention Not Indicated  Physical Activity Interventions Intervention Not Indicated  Stress Interventions Intervention Not Indicated  Social Connections Interventions Intervention Not Indicated  Transportation Interventions Patient Resources (Friends/Family), Intervention Not Indicated       SDOH Screenings   Alcohol Screen: Low Risk  (04/20/2022)   Alcohol Screen    Last Alcohol Screening Score (AUDIT): 0  Depression (PHQ2-9): Low Risk  (04/20/2022)   Depression (PHQ2-9)    PHQ-2 Score: 0  Financial Resource Strain: Low Risk  (04/20/2022)   Overall Financial Resource Strain (CARDIA)    Difficulty of Paying Living Expenses: Not very hard  Food Insecurity: No Food Insecurity (04/20/2022)   Hunger Vital Sign    Worried About Running Out of Food in the Last Year: Never true    Ran Out of Food in the Last Year: Never true  Housing: Low Risk  (04/20/2022)   Housing    Last Housing Risk Score: 0  Physical Activity: Inactive (04/20/2022)   Exercise Vital Sign    Days of Exercise per Week: 0 days    Minutes of Exercise per Session: 0 min  Social Connections: Socially Integrated (04/20/2022)   Social Connection and Isolation Panel [NHANES]    Frequency of Communication with Friends and Family: More than three times a week    Frequency of Social Gatherings with Friends and Family: More than three times a week    Attends Religious Services: More than 4 times per year    Active Member of [employer] or Organizations: Yes     Attends Archivist Meetings: More than 4 times per year    Marital Status: Married  Stress: No Stress Concern Present (04/20/2022)   [place]    Feeling of Stress : Only a little  Tobacco Use: Low Risk  (04/19/2022)   Patient History    Smoking Tobacco Use: Never    Smokeless Tobacco Use: Never    Passive Exposure: Not on file  Transportation Needs: No Transportation Needs (04/20/2022)   PRAPARE - Transportation    Lack of Transportation (Medical): No    Lack of Transportation (Non-Medical): No     Distress Screen completed: No     No data to display            Family/Social Information:  Housing Arrangement: patient lives with spouse,  Ariana Wilson, Ariana Wilson (Spouse)  [phone] (Mobile) Family members/support persons in your life? 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  Income source: Supported by [facility] and Friends Financial concerns:  Patient has some financial concerns, but no pressing concerns Type of concern: Medical bills Food access concerns: no Religious or spiritual practice: Yes-patient active in church Services Currently in place:  [employer] PPO  Coping/ Adjustment to diagnosis: Patient understands treatment plan and what happens next? yes Concerns about diagnosis and/or treatment: I'm not especially worried about anything Patient  reported stressors:  Patient did not report stressors Hopes and/or priorities: N/A Patient enjoys time with family/ friends and active in church Current coping skills/ strengths: Ability for insight , Active sense of humor , Average or above average intelligence , Capable of independent living , Scientist, research (life sciences) , General fund of knowledge , Motivation for treatment/growth , Religious  Affiliation , Special hobby/interest , and Supportive family/friends     SUMMARY: Current SDOH Barriers:  No social work barriers  Clinical Social Work Clinical Goal(s):  No clinical social work goals at this time  Interventions: Discussed common feeling and emotions when being diagnosed with cancer, and the importance of support during treatment Informed patient of the support team roles and support services at [facility] Provided [place] contact information and encouraged patient to call with any questions or concerns Referred patient to [employer] for disability, Designer, jewellery and Provided patient with information about CSW role in patient care and other available resources.   Follow Up Plan: Patient will contact CSW with any support or resource needs Patient verbalizes understanding of plan: Yes    Ariana Wich, LCSW

## 2022-04-21 ENCOUNTER — Inpatient Hospital Stay: Payer: BC Managed Care – PPO

## 2022-04-22 ENCOUNTER — Inpatient Hospital Stay: Payer: BC Managed Care – PPO

## 2022-04-25 ENCOUNTER — Inpatient Hospital Stay (HOSPITAL_BASED_OUTPATIENT_CLINIC_OR_DEPARTMENT_OTHER): Payer: BC Managed Care – PPO | Admitting: Medical Oncology

## 2022-04-25 ENCOUNTER — Encounter: Payer: Self-pay | Admitting: Medical Oncology

## 2022-04-25 ENCOUNTER — Inpatient Hospital Stay: Payer: BC Managed Care – PPO

## 2022-04-25 VITALS — BP 104/52 | HR 80 | Temp 98.7°F | Resp 20 | Wt 149.4 lb

## 2022-04-25 DIAGNOSIS — D649 Anemia, unspecified: Secondary | ICD-10-CM | POA: Diagnosis not present

## 2022-04-25 DIAGNOSIS — Z5112 Encounter for antineoplastic immunotherapy: Secondary | ICD-10-CM | POA: Diagnosis not present

## 2022-04-25 DIAGNOSIS — Z5111 Encounter for antineoplastic chemotherapy: Secondary | ICD-10-CM

## 2022-04-25 DIAGNOSIS — C7A8 Other malignant neuroendocrine tumors: Secondary | ICD-10-CM | POA: Diagnosis not present

## 2022-04-25 LAB — COMPREHENSIVE METABOLIC PANEL
ALT: 9 U/L (ref 0–44)
AST: 13 U/L — ABNORMAL LOW (ref 15–41)
Albumin: 3.7 g/dL (ref 3.5–5.0)
Alkaline Phosphatase: 58 U/L (ref 38–126)
Anion gap: 10 (ref 5–15)
BUN: 36 mg/dL — ABNORMAL HIGH (ref 8–23)
CO2: 24 mmol/L (ref 22–32)
Calcium: 8.8 mg/dL — ABNORMAL LOW (ref 8.9–10.3)
Chloride: 100 mmol/L (ref 98–111)
Creatinine, Ser: 0.71 mg/dL (ref 0.44–1.00)
GFR, Estimated: >60 mL/min (ref >60)
Glucose, Bld: 122 mg/dL — ABNORMAL HIGH (ref 70–99)
Potassium: 3.9 mmol/L (ref 3.5–5.1)
Sodium: 134 mmol/L — ABNORMAL LOW (ref 135–145)
Total Bilirubin: 0.2 mg/dL — ABNORMAL LOW (ref 0.3–1.2)
Total Protein: 6.9 g/dL (ref 6.5–8.1)

## 2022-04-25 LAB — CBC WITH DIFFERENTIAL/PLATELET
Abs Immature Granulocytes: 0.13 10*3/uL — ABNORMAL HIGH (ref 0.00–0.07)
Basophils Absolute: 0.1 10*3/uL (ref 0.0–0.1)
Basophils Relative: 1 %
Eosinophils Absolute: 0 10*3/uL (ref 0.0–0.5)
Eosinophils Relative: 0 %
HCT: 24.9 % — ABNORMAL LOW (ref 36.0–46.0)
Hemoglobin: 7.9 g/dL — ABNORMAL LOW (ref 12.0–15.0)
Immature Granulocytes: 2 %
Lymphocytes Relative: 26 %
Lymphs Abs: 2 10*3/uL (ref 0.7–4.0)
MCH: 30.2 pg (ref 26.0–34.0)
MCHC: 31.7 g/dL (ref 30.0–36.0)
MCV: 95 fL (ref 80.0–100.0)
Monocytes Absolute: 1 10*3/uL (ref 0.1–1.0)
Monocytes Relative: 13 %
Neutro Abs: 4.4 10*3/uL (ref 1.7–7.7)
Neutrophils Relative %: 58 %
Platelets: 286 10*3/uL (ref 150–400)
RBC: 2.62 MIL/uL — ABNORMAL LOW (ref 3.87–5.11)
RDW: 21.3 % — ABNORMAL HIGH (ref 11.5–15.5)
WBC: 7.7 10*3/uL (ref 4.0–10.5)
nRBC: 4.1 % — ABNORMAL HIGH (ref 0.0–0.2)

## 2022-04-25 LAB — MAGNESIUM: Magnesium: 2.4 mg/dL (ref 1.7–2.4)

## 2022-04-25 MED FILL — Dexamethasone Sodium Phosphate Inj 100 MG/10ML: INTRAMUSCULAR | Qty: 1 | Status: AC

## 2022-04-25 MED FILL — Fosaprepitant Dimeglumine For IV Infusion 150 MG (Base Eq): INTRAVENOUS | Qty: 5 | Status: AC

## 2022-04-25 NOTE — Progress Notes (Signed)
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 Haig Prophet on 03/01/2022 EGD showed a medium-sized ulcerating mass with no bleeding or stigmata of recent bleeding at the GE junction..  Mass was partially obstructing.  There is also mention of another ulcerated noncircumferential mass with oozing bleeding found at the GE junction with extension into the cardia.  Both these masses were biopsied.  Pathology was pending at the time of my visit with the patient and was reported to be a poorly differentiated carcinoma.  Patient is currently able to swallow soft foods without much difficulty.  However more solid foods occasionally feel stuck.    PET was denied by insurance.  CT chest abdomen and pelvis with contrast showed asymmetric distal esophageal wall thickening with thickening extending around the lesser curvature of the stomach through the gastric cardia and proximal fundus.  Paratracheal/paraesophageal, gastrohepatic and PET retroperitoneal adenopathy.  Left periaortic lymph node measuring 17 mm at the level of left renal hilum.   Final pathology showed there were 2 specimens collected.  Specimen a was poorly differentiated carcinoma with ulceration and necrosis.v verys weakly positive for synaptophysin 50% of the cells.  Cells negative for CK7 and CK23 for the BF1 CDX2 100 Melan-A HMB45 and CD20.  Specimen B    Showed large cell  neuroendocrine carcinoma with neoplastic cells positive for CD56 with diffuse strong staining and majority of the neoplastic cells were also positive for synaptophysin with weak staining Ki-67 95%  Interval history- Patient presents with her husband. They report that she is doing really well. Tolerating chemotherapy well. Able to eat some solid foods which she had not been able to do since May. Had a hospitalization for neutropenic fever on 04/13/2022. She feels back to baseline. No recent fevers, bleeding episodes.   ECOG PS- 1 Pain scale- 0   Review of systems- Review of Systems  Constitutional:  Positive for malaise/fatigue. Negative for chills, fever and weight loss.  HENT:  Negative for congestion, ear discharge and nosebleeds.   Eyes:  Negative for blurred vision.  Respiratory:  Negative for cough, hemoptysis, sputum production, shortness of breath and wheezing.   Cardiovascular:  Negative for chest pain, palpitations, orthopnea and claudication.  Gastrointestinal:  Negative for abdominal pain, blood in stool, constipation, diarrhea, heartburn, melena, nausea and vomiting.  Genitourinary:  Negative for dysuria, flank pain, frequency, hematuria and urgency.  Musculoskeletal:  Negative for back pain, joint pain and myalgias.  Skin:  Negative for rash.  Neurological:  Negative for dizziness, tingling, focal weakness, seizures, weakness and headaches.  Endo/Heme/Allergies:  Does not bruise/bleed easily.  Psychiatric/Behavioral:  Negative for depression and suicidal ideas. The patient does not have insomnia.       Allergies  Allergen Reactions   Sulfa Antibiotics Hives    Says her tongue swelled a bit   Sulfonamide Derivatives Swelling    Tongue swelling     Past Medical History:  Diagnosis Date  Anemia    Arthritis    Atrial fibrillation ([place])    B12 deficiency    Dysrhythmia    Hyperlipidemia    Hypothyroidism    Menopause    Palpitations    Hx of, 25 years ago    Vertigo      Past Surgical History:  Procedure Laterality Date   BILATERAL SALPINGOOPHORECTOMY  2008   DILATION AND CURETTAGE OF UTERUS  08/24/2006   ESOPHAGOGASTRODUODENOSCOPY (EGD) WITH PROPOFOL N/A 03/01/2022   Procedure: ESOPHAGOGASTRODUODENOSCOPY (EGD) WITH PROPOFOL;  Surgeon: Lesly Rubenstein, MD;  Location: [facility] ENDOSCOPY;  Service: Endoscopy;  Laterality: N/A;   incision tendon sheath for trigger finger Right    ring finger   LAPAROSCOPIC SUPRACERVICAL HYSTERECTOMY  2008   Dr. Ammie Dalton; with BSO   NOSE SURGERY  03/2008   PORTA CATH INSERTION N/A 03/21/2022   Procedure: PORTA CATH INSERTION;  Surgeon: Algernon Huxley, MD;  Location: [place] [facility];  Service: Cardiovascular;  Laterality: N/A;    Social History   Socioeconomic History   Marital status: Married    Spouse name: Not on file   Number of children: Not on file   Years of education: Not on file   Highest education level: Not on file  Occupational History   Occupation: Full time  Tobacco Use   Smoking status: Never   Smokeless tobacco: Never  Vaping Use   Vaping Use: Never used  Substance and Sexual Activity   Alcohol use: Never   Drug use: Never   Sexual activity: Not Currently    Birth control/protection: Surgical    Comment: Hysterectomy  Other Topics Concern   Not on file  Social History Narrative   Married with 2 children   Gets regular exercise   Social Determinants of Health   Financial Resource Strain: Low Risk  (04/20/2022)   Overall Financial Resource Strain (CARDIA)    Difficulty of Paying Living Expenses: Not very hard  Food Insecurity: No Food Insecurity (04/20/2022)   Hunger Vital Sign    Worried About Running Out of Food in the Last Year: Never true    Ran Out of Food in the Last Year: Never true  Transportation Needs: No Transportation Needs (04/20/2022)   PRAPARE - Hydrologist (Medical): No    Lack of Transportation (Non-Medical): No  Physical  Activity: Inactive (04/20/2022)   Exercise Vital Sign    Days of Exercise per Week: 0 days    Minutes of Exercise per Session: 0 min  Stress: No Stress Concern Present (04/20/2022)   [place]    Feeling of Stress : Only a little  Social Connections: Socially Integrated (04/20/2022)   Social Connection and Isolation Panel [NHANES]    Frequency of Communication with Friends and Family: More than three times a week    Frequency of Social Gatherings with Friends and Family: More than three times a week    Attends Religious Services: More than 4 times per year    Active Member of [employer] or Organizations: Yes    Attends Music therapist: More than 4 times per year    Marital Status: Married  Human resources officer Violence: Not on file    Family History  Problem Relation Age of Onset   Hyperlipidemia Mother    Hypertension Mother    Stroke Mother    Heart disease Father    Hyperlipidemia Father    Atrial fibrillation  Father    Melanoma Father    Arrhythmia Sister        Atrial fibrillation   Heart disease Sister    Atrial fibrillation Sister    Heart attack Brother    Breast cancer Neg Hx      Current Outpatient Medications:    Calcium Carbonate-Vitamin D 600-200 MG-UNIT TABS, Take 1 tablet by mouth daily.  , Disp: , Rfl:    cetirizine (ZYRTEC) 10 MG tablet, Take 10 mg by mouth daily., Disp: , Rfl:    Cholecalciferol 25 MCG (1000 UT) tablet, Take 1,000 Units by mouth daily., Disp: , Rfl:    dexamethasone (DECADRON) 4 MG tablet, Pt has 3 day chemo regimen and will need to take 2 tablets ( 31m) on day 4 for each cycle of chemo. Make sure to eat food in the am and then take the med, Disp: 30 tablet, Rfl: 1   DHEA 25 MG tablet, Take 25 mg by mouth daily., Disp: , Rfl:    diclofenac Sodium (VOLTAREN) 1 % GEL, Apply 2 g topically 4 (four) times daily., Disp: , Rfl:    flecainide (TAMBOCOR) 50 MG tablet, Take 1 tablet  by mouth 2 (two) times daily., Disp: , Rfl:    fluconazole (DIFLUCAN) 150 MG tablet, Take 1 tablet (150 mg total) by mouth every 3 (three) days., Disp: 2 tablet, Rfl: 0   levofloxacin (LEVAQUIN) 500 MG tablet, Take 1 tablet (500 mg total) by mouth daily., Disp: 5 tablet, Rfl: 0   lidocaine-prilocaine (EMLA) cream, Apply to affected area once, Disp: 30 g, Rfl: 3   LORazepam (ATIVAN) 0.5 MG tablet, Take 1 tablet (0.5 mg total) by mouth every 6 (six) hours as needed (Nausea or vomiting)., Disp: 30 tablet, Rfl: 0   midodrine (PROAMATINE) 10 MG tablet, Take 1 tablet (10 mg total) by mouth 3 (three) times daily with meals., Disp: 90 tablet, Rfl: 0   mometasone (NASONEX) 50 MCG/ACT nasal spray, Place 2 sprays into the nose daily., Disp: , Rfl:    pantoprazole (PROTONIX) 40 MG tablet, Take 40 mg by mouth daily., Disp: , Rfl:    Saccharomyces boulardii (PROBIOTIC) 250 MG CAPS, Take 1 capsule by mouth daily., Disp: , Rfl:    simvastatin (ZOCOR) 20 MG tablet, TAKE 1 TABLET BY MOUTH EVERY EVENING., Disp: 30 tablet, Rfl: 0   vitamin B-12 (CYANOCOBALAMIN) 1000 MCG tablet, Take 1,000 mcg by mouth daily., Disp: , Rfl:    Zinc Citrate-Phytase (ZYTAZE) 25-500 MG CAPS, Take 1 Dose by mouth daily., Disp: , Rfl:    metoprolol succinate (TOPROL-XL) 25 MG 24 hr tablet, Take 0.5 tablets (12.5 mg total) by mouth 2 (two) times daily. (Patient not taking: Reported on 04/19/2022), Disp: 30 tablet, Rfl: 0   prochlorperazine (COMPAZINE) 10 MG tablet, Take 1 tablet (10 mg total) by mouth every 6 (six) hours as needed (Nausea or vomiting). (Patient not taking: Reported on 03/29/2022), Disp: 30 tablet, Rfl: 1  Physical exam:  Vitals:   04/25/22 0938  BP: (!) 104/52  Pulse: 80  Resp: 20  Temp: 98.7 F (37.1 C)  SpO2: 100%  Weight: 149 lb 6.4 oz (67.8 kg)   Physical Exam Constitutional:      General: She is not in acute distress. Cardiovascular:     Rate and Rhythm: Normal rate and regular rhythm.     Heart sounds: Normal  heart sounds.  Pulmonary:     Effort: Pulmonary effort is normal.     Breath sounds: Normal breath  sounds.  Skin:    General: Skin is warm and dry.  Neurological:     Mental Status: She is alert and oriented to person, place, and time.         Latest Ref Rng & Units 04/25/2022    9:18 AM  CMP  Glucose 70 - 99 mg/dL 122   BUN 8 - 23 mg/dL 36   Creatinine 0.44 - 1.00 mg/dL 0.71   Sodium 135 - 145 mmol/L 134   Potassium 3.5 - 5.1 mmol/L 3.9   Chloride 98 - 111 mmol/L 100   CO2 22 - 32 mmol/L 24   Calcium 8.9 - 10.3 mg/dL 8.8   Total Protein 6.5 - 8.1 g/dL 6.9   Total Bilirubin 0.3 - 1.2 mg/dL 0.2   Alkaline Phos 38 - 126 U/L 58   AST 15 - 41 U/L 13   ALT 0 - 44 U/L 9       Latest Ref Rng & Units 04/25/2022    9:18 AM  CBC  WBC 4.0 - 10.5 K/uL 7.7   Hemoglobin 12.0 - 15.0 g/dL 7.9   Hematocrit 36.0 - 46.0 % 24.9   Platelets 150 - 400 K/uL 286     No images are attached to the encounter.  CT Angio Chest Pulmonary Embolism (PE) W or WO Contrast  Result Date: 04/13/2022 CLINICAL DATA:  Pulmonary embolism (PE) suspected, high prob rales on exam bl. Neutropenic fever. History of esophageal cancer, active chemotherapy. EXAM: CT ANGIOGRAPHY CHEST WITH CONTRAST TECHNIQUE: Multidetector CT imaging of the chest was performed using the standard protocol during bolus administration of intravenous contrast. Multiplanar CT image reconstructions and MIPs were obtained to evaluate the vascular anatomy. RADIATION DOSE REDUCTION: This exam was performed according to the departmental dose-optimization program which includes automated exposure control, adjustment of the mA and/or kV according to patient size and/or use of iterative reconstruction technique. CONTRAST:  70m OMNIPAQUE IOHEXOL 350 MG/ML SOLN COMPARISON:  Chest radiograph earlier today. CT 03/17/2022. PET CT 03/22/2022 FINDINGS: Cardiovascular: There are no filling defects within the pulmonary arteries to suggest pulmonary embolus. Mild  aortic atherosclerosis, no aneurysm. There are coronary artery calcifications. Heart is normal in size. No pericardial effusion. Mediastinum/Nodes: There is wall thickening of the distal esophagus extending to the gastroesophageal junction consistent with known malignancy. Mild adjacent paraesophageal fat stranding. No paraesophageal collection or pneumomediastinum. Upper esophagus is minimally patulous. The paraesophageal lymph node on prior exam is difficult to delineate from the esophageal wall thickening currently. Left upper paratracheal no new the thoracic inlet measures 14 mm, series 5, image 28, this previously measured 10 mm. 14 mm left upper paratracheal node series 5, image 52, previously 8 mm. There is a 13 mm right supraclavicular node, series 5, image 46, previously 11 mm. There are increased number of right upper paratracheal nodes from prior exam, that are subcentimeter. Lungs/Pleura: Mild breathing motion artifact limits detailed parenchymal assessment. Scattered areas of bandlike atelectasis. Heterogeneous pulmonary parenchyma. No confluent consolidation. No visible pulmonary nodule. No pleural effusion. Upper Abdomen: No acute findings. Musculoskeletal: There are no acute or suspicious osseous abnormalities. No chest wall soft tissue abnormalities. Review of the MIP images confirms the above findings. IMPRESSION: 1. No pulmonary embolus. 2. Heterogeneous pulmonary parenchyma may represent small airways disease. Scattered areas of bandlike atelectasis. No evidence of pneumonia. 3. Distal esophageal wall thickening extending to the gastroesophageal junction consistent with known malignancy. There is mild adjacent paraesophageal fat stranding. 4. Mediastinal adenopathy with slight increase size of  multiple lymph nodes from prior exam. Attention on subsequent staging exams recommended. Aortic Atherosclerosis (ICD10-I70.0). Electronically Signed   By: Keith Rake M.D.   On: 04/13/2022 23:25   DG  Chest 2 View  Result Date: 04/13/2022 CLINICAL DATA:  Neutropenic fever. EXAM: CHEST - 2 VIEW COMPARISON:  10/30/2013 FINDINGS: Porta catheter on the right with tip at the SVC. Normal heart size and mediastinal contours. There is no edema, consolidation, effusion, or pneumothorax. IMPRESSION: Negative for pneumonia. Electronically Signed   By: Jorje Guild M.D.   On: 04/13/2022 11:00   MR Brain W Wo Contrast  Result Date: 04/03/2022 CLINICAL DATA:  Esophageal cancer, intracranial staging EXAM: MRI HEAD WITHOUT AND WITH CONTRAST TECHNIQUE: Multiplanar, multiecho pulse sequences of the brain and surrounding structures were obtained without and with intravenous contrast. CONTRAST:  7.73m GADAVIST GADOBUTROL 1 MMOL/ML IV SOLN COMPARISON:  03/22/2022 PET-CT FINDINGS: Brain: No enhancement or swelling to suggest metastatic disease. Irregularly-shaped area of T2 hyperintensity in the right cerebral white matter with underlying developmental venous anomaly. No acute hemorrhage or swelling superimposed. No acute infarct, hydrocephalus, or collection. Vascular: Major flow voids and vascular enhancements are preserved Skull and upper cervical spine: Negative for marrow lesion. Sinuses/Orbits: Negative IMPRESSION: 1. Negative for metastatic disease. 2. 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 Electronically Signed   By: JJorje GuildM.D.   On: 04/03/2022 08:24     Assessment and plan- Patient is a 61y.o. 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  She is here for consideration of cisplatin etoposide chemotherapy plus Tacentriq   Per Dr. RElroy Channellast note: "[facility] rather than a large cell neuroendocrine tumor.  I have discussed these findings [facility].  Plan is  to proceed with 2 cycles of cisplatin etoposide chemotherapy at this time followed by repeat scans.  If she is continuing to respond we will continue with cisplatin etoposide regimen.  If she does not then we will consider switching her to FOLFOX or CarboTaxol regimen."  Reviewed labs. Acceptable for treatment- anemia secondary to malignancy and chemotherapy- will continue Venofer and we will plan for a blood transfusion this week. Also adding Udenyca to help prevent neutropenic fever.   Tecentriq finally approved by insurance- to start tomorrow.   To see Dr. RJanese Banksprior to cycle 3 of treatment with scans prior- pushing date back slightly given delay in cycle 2 due to her hospitalization. She will have these prior to her next visit with Dr. RJanese Banks     Visit Diagnosis 1. Primary malignant neuroendocrine neoplasm of esophagus ([place]   2. Symptomatic anemia   3. Encounter for antineoplastic chemotherapy   4. Encounter for antineoplastic immunotherapy      SMinna AntisCOakland Mercy Hospitalat [facility]8/14/2023 11:35 AM

## 2022-04-26 ENCOUNTER — Inpatient Hospital Stay: Payer: BC Managed Care – PPO

## 2022-04-26 VITALS — BP 113/56 | HR 90 | Temp 97.9°F | Resp 18

## 2022-04-26 DIAGNOSIS — C7A8 Other malignant neuroendocrine tumors: Secondary | ICD-10-CM

## 2022-04-26 DIAGNOSIS — Z5112 Encounter for antineoplastic immunotherapy: Secondary | ICD-10-CM | POA: Diagnosis not present

## 2022-04-26 MED ORDER — MAGNESIUM SULFATE 2 GM/50ML IV SOLN
2.0000 g | Freq: Once | INTRAVENOUS | Status: AC
  Administered 2022-04-26: 2 g via INTRAVENOUS

## 2022-04-26 MED ORDER — SODIUM CHLORIDE 0.9 % IV SOLN
150.0000 mg | Freq: Once | INTRAVENOUS | Status: AC
  Administered 2022-04-26: 150 mg via INTRAVENOUS
  Filled 2022-04-26: qty 150

## 2022-04-26 MED ORDER — SODIUM CHLORIDE 0.9 % IV SOLN
1200.0000 mg | Freq: Once | INTRAVENOUS | Status: AC
  Administered 2022-04-26: 1200 mg via INTRAVENOUS
  Filled 2022-04-26: qty 20

## 2022-04-26 MED ORDER — PALONOSETRON HCL INJECTION 0.25 MG/5ML
0.2500 mg | Freq: Once | INTRAVENOUS | Status: AC
  Administered 2022-04-26: 0.25 mg via INTRAVENOUS

## 2022-04-26 MED ORDER — SODIUM CHLORIDE 0.9 % IV SOLN
75.0000 mg/m2 | Freq: Once | INTRAVENOUS | Status: AC
  Administered 2022-04-26: 128 mg via INTRAVENOUS
  Filled 2022-04-26: qty 128

## 2022-04-26 MED ORDER — SODIUM CHLORIDE 0.9 % IV SOLN
Freq: Once | INTRAVENOUS | Status: AC
  Filled 2022-04-26: qty 250

## 2022-04-26 MED ORDER — SODIUM CHLORIDE 0.9 % IV SOLN
10.0000 mg | Freq: Once | INTRAVENOUS | Status: AC
  Administered 2022-04-26: 10 mg via INTRAVENOUS
  Filled 2022-04-26: qty 10

## 2022-04-26 MED ORDER — POTASSIUM CHLORIDE IN NACL 20-0.9 MEQ/L-% IV SOLN
Freq: Once | INTRAVENOUS | Status: AC
  Filled 2022-04-26: qty 1000

## 2022-04-26 MED ORDER — HEPARIN SOD (PORK) LOCK FLUSH 100 UNIT/ML IV SOLN
500.0000 [IU] | Freq: Once | INTRAVENOUS | Status: AC | PRN
  Administered 2022-04-26: 500 [IU]
  Filled 2022-04-26: qty 5

## 2022-04-26 MED ORDER — SODIUM CHLORIDE 0.9 % IV SOLN
100.0000 mg/m2 | Freq: Once | INTRAVENOUS | Status: AC
  Administered 2022-04-26: 170 mg via INTRAVENOUS
  Filled 2022-04-26: qty 8.5

## 2022-04-26 MED FILL — Dexamethasone Sodium Phosphate Inj 100 MG/10ML: INTRAMUSCULAR | Qty: 1 | Status: AC

## 2022-04-26 NOTE — Progress Notes (Signed)
Nutrition Follow-up:  Patient with large neuroendocrine tumor of GE junction, stage IV.  Patient receiving neoadjuvant chemotherapy.  No radiation.   Met with patient and husband during infusion.  Patient reports that she is continued to be able to swallow more foods after the first round of treatments.  Bread has been a little tough to swallow but able to tolerate meats and other foods.  Has been trying to cut out sugar.  Noted hospitalization 8/2-8/6 for neutropenic fever.  Feeling better.  Medications: reviewed  Labs: reviewed  Anthropometrics:   149 lb 6.4 oz on 8/14  153 lb 11.2 oz on 7/28 156 lb 10.2 oz on 7/19 167 lb on 06/29/21   NUTRITION DIAGNOSIS: Inadequate oral intake continues with weight loss    INTERVENTION:  Encouraged patient to liberalize diet with weight loss.   Discussed ways to add calories to diet (half and half and cheese in scrambled eggs)     MONITORING, EVALUATION, GOAL: weight trends, intake   NEXT VISIT: to be scheduled with next treatment  Ariana Wilson, [place], [place] Registered Dietitian [phone]

## 2022-04-26 NOTE — Patient Instructions (Signed)
[facility]  Discharge Instructions: Thank you for choosing [facility] to provide your oncology and hematology care.   If you have a lab appointment with the Cancer Center, please go directly to the Cancer Center and check in at the registration area.   Wear comfortable clothing and clothing appropriate for easy access to any Portacath or PICC line.   We strive to give you quality time with your provider. You may need to reschedule your appointment if you arrive late (15 or more minutes).  Arriving late affects you and other patients whose appointments are after yours.  Also, if you miss three or more appointments without notifying the office, you may be dismissed from the clinic at the provider's discretion.      For prescription refill requests, have your pharmacy contact our office and allow 72 hours for refills to be completed.    Today you received the following chemotherapy and/or immunotherapy agents       To help prevent nausea and vomiting after your treatment, we encourage you to take your nausea medication as directed.  BELOW ARE SYMPTOMS THAT SHOULD BE REPORTED IMMEDIATELY: *FEVER GREATER THAN 100.4 F (38 C) OR HIGHER *CHILLS OR SWEATING *NAUSEA AND VOMITING THAT IS NOT CONTROLLED WITH YOUR NAUSEA MEDICATION *UNUSUAL SHORTNESS OF BREATH *UNUSUAL BRUISING OR BLEEDING *URINARY PROBLEMS (pain or burning when urinating, or frequent urination) *BOWEL PROBLEMS (unusual diarrhea, constipation, pain near the anus) TENDERNESS IN MOUTH AND THROAT WITH OR WITHOUT PRESENCE OF ULCERS (sore throat, sores in mouth, or a toothache) UNUSUAL RASH, SWELLING OR PAIN  UNUSUAL VAGINAL DISCHARGE OR ITCHING   Items with * indicate a potential emergency and should be followed up as soon as possible or go to the Emergency Department if any problems should occur.  Please show the CHEMOTHERAPY ALERT CARD or IMMUNOTHERAPY ALERT CARD at check-in to the  Emergency Department and triage nurse.  Should you have questions after your visit or need to cancel or reschedule your appointment, please contact [facility]  Dept: [phone]  and follow the prompts.  Office hours are 8:00 a.m. to 4:30 p.m. Monday - Friday. Please note that voicemails left after 4:00 p.m. may not be returned until the following business day.  We are closed weekends and major holidays. You have access to a nurse at all times for urgent questions. Please call the main number to the clinic Dept: [phone] and follow the prompts.   For any non-urgent questions, you may also contact your provider using MyChart. We now offer e-Visits for anyone 18 and older to request care online for non-urgent symptoms. For details visit [email or web address].   Also download the MyChart app! Go to the app store, search "MyChart", open the app, select [facility], and log in with your MyChart username and password.  Masks are optional in the cancer centers. If you would like for your care team to wear a mask while they are taking care of you, please let them know. You may have one support person who is at least 61 years old accompany you for your appointments. 

## 2022-04-27 ENCOUNTER — Other Ambulatory Visit: Payer: BC Managed Care – PPO

## 2022-04-27 ENCOUNTER — Ambulatory Visit: Payer: BC Managed Care – PPO | Admitting: Medical Oncology

## 2022-04-27 ENCOUNTER — Inpatient Hospital Stay: Payer: BC Managed Care – PPO

## 2022-04-27 ENCOUNTER — Other Ambulatory Visit: Payer: Self-pay | Admitting: *Deleted

## 2022-04-27 ENCOUNTER — Ambulatory Visit: Payer: BC Managed Care – PPO

## 2022-04-27 VITALS — BP 94/48 | Temp 97.0°F | Resp 16

## 2022-04-27 DIAGNOSIS — D649 Anemia, unspecified: Secondary | ICD-10-CM

## 2022-04-27 DIAGNOSIS — C7A8 Other malignant neuroendocrine tumors: Secondary | ICD-10-CM

## 2022-04-27 DIAGNOSIS — Z5112 Encounter for antineoplastic immunotherapy: Secondary | ICD-10-CM | POA: Diagnosis not present

## 2022-04-27 DIAGNOSIS — D508 Other iron deficiency anemias: Secondary | ICD-10-CM

## 2022-04-27 LAB — PREPARE RBC (CROSSMATCH)

## 2022-04-27 MED ORDER — SODIUM CHLORIDE 0.9 % IV SOLN
100.0000 mg/m2 | Freq: Once | INTRAVENOUS | Status: AC
  Administered 2022-04-27: 170 mg via INTRAVENOUS
  Filled 2022-04-27: qty 8.5

## 2022-04-27 MED ORDER — SODIUM CHLORIDE 0.9 % IV SOLN
10.0000 mg | Freq: Once | INTRAVENOUS | Status: AC
  Administered 2022-04-27: 10 mg via INTRAVENOUS
  Filled 2022-04-27: qty 10

## 2022-04-27 MED ORDER — SODIUM CHLORIDE 0.9 % IV SOLN
200.0000 mg | INTRAVENOUS | Status: DC
  Administered 2022-04-27: 200 mg via INTRAVENOUS
  Filled 2022-04-27: qty 10

## 2022-04-27 MED ORDER — HEPARIN SOD (PORK) LOCK FLUSH 100 UNIT/ML IV SOLN
500.0000 [IU] | Freq: Once | INTRAVENOUS | Status: AC | PRN
  Administered 2022-04-27: 500 [IU]
  Filled 2022-04-27: qty 5

## 2022-04-27 MED ORDER — SODIUM CHLORIDE 0.9 % IV SOLN
Freq: Once | INTRAVENOUS | Status: AC
  Filled 2022-04-27: qty 250

## 2022-04-27 MED FILL — Dexamethasone Sodium Phosphate Inj 100 MG/10ML: INTRAMUSCULAR | Qty: 1 | Status: AC

## 2022-04-27 NOTE — Progress Notes (Signed)
Error

## 2022-04-28 ENCOUNTER — Inpatient Hospital Stay: Payer: BC Managed Care – PPO

## 2022-04-28 ENCOUNTER — Ambulatory Visit: Payer: BC Managed Care – PPO

## 2022-04-28 VITALS — BP 102/58 | HR 72 | Temp 98.5°F | Resp 16

## 2022-04-28 DIAGNOSIS — Z5112 Encounter for antineoplastic immunotherapy: Secondary | ICD-10-CM | POA: Diagnosis not present

## 2022-04-28 DIAGNOSIS — D649 Anemia, unspecified: Secondary | ICD-10-CM

## 2022-04-28 DIAGNOSIS — C7A8 Other malignant neuroendocrine tumors: Secondary | ICD-10-CM

## 2022-04-28 MED ORDER — HEPARIN SOD (PORK) LOCK FLUSH 100 UNIT/ML IV SOLN
500.0000 [IU] | Freq: Every day | INTRAVENOUS | Status: AC | PRN
  Administered 2022-04-28: 500 [IU]
  Filled 2022-04-28: qty 5

## 2022-04-28 MED ORDER — SODIUM CHLORIDE 0.9% FLUSH
10.0000 mL | INTRAVENOUS | Status: AC | PRN
  Administered 2022-04-28: 10 mL
  Filled 2022-04-28: qty 10

## 2022-04-28 MED ORDER — ACETAMINOPHEN 325 MG PO TABS
650.0000 mg | ORAL_TABLET | Freq: Once | ORAL | Status: AC
  Administered 2022-04-28: 650 mg via ORAL
  Filled 2022-04-28: qty 2

## 2022-04-28 MED ORDER — SODIUM CHLORIDE 0.9 % IV SOLN
Freq: Once | INTRAVENOUS | Status: AC
  Filled 2022-04-28: qty 250

## 2022-04-28 MED ORDER — SODIUM CHLORIDE 0.9 % IV SOLN
10.0000 mg | Freq: Once | INTRAVENOUS | Status: AC
  Administered 2022-04-28: 10 mg via INTRAVENOUS
  Filled 2022-04-28: qty 10

## 2022-04-28 MED ORDER — SODIUM CHLORIDE 0.9% IV SOLUTION
250.0000 mL | Freq: Once | INTRAVENOUS | Status: AC
  Administered 2022-04-28: 250 mL via INTRAVENOUS
  Filled 2022-04-28: qty 250

## 2022-04-28 MED ORDER — FUROSEMIDE 10 MG/ML IJ SOLN
20.0000 mg | Freq: Once | INTRAMUSCULAR | Status: AC
  Administered 2022-04-28: 20 mg via INTRAVENOUS
  Filled 2022-04-28: qty 2

## 2022-04-28 MED ORDER — SODIUM CHLORIDE 0.9 % IV SOLN
100.0000 mg/m2 | Freq: Once | INTRAVENOUS | Status: AC
  Administered 2022-04-28: 170 mg via INTRAVENOUS
  Filled 2022-04-28: qty 8.5

## 2022-04-28 MED ORDER — DIPHENHYDRAMINE HCL 50 MG/ML IJ SOLN
25.0000 mg | Freq: Once | INTRAMUSCULAR | Status: AC
  Administered 2022-04-28: 25 mg via INTRAVENOUS
  Filled 2022-04-28: qty 1

## 2022-04-28 MED FILL — Iron Sucrose Inj 20 MG/ML (Fe Equiv): INTRAVENOUS | Qty: 10 | Status: AC

## 2022-04-29 ENCOUNTER — Other Ambulatory Visit: Payer: BC Managed Care – PPO

## 2022-04-29 ENCOUNTER — Inpatient Hospital Stay: Payer: BC Managed Care – PPO

## 2022-04-29 DIAGNOSIS — Z5112 Encounter for antineoplastic immunotherapy: Secondary | ICD-10-CM | POA: Diagnosis not present

## 2022-04-29 DIAGNOSIS — C7A8 Other malignant neuroendocrine tumors: Secondary | ICD-10-CM

## 2022-04-29 LAB — BPAM RBC
Blood Product Expiration Date: 202308232359
ISSUE DATE / TIME: 202308171145
Unit Type and Rh: 9500

## 2022-04-29 LAB — TYPE AND SCREEN
ABO/RH(D): B POS
Antibody Screen: NEGATIVE
Unit division: 0

## 2022-04-29 MED ORDER — PEGFILGRASTIM-CBQV 6 MG/0.6ML ~~LOC~~ SOSY
6.0000 mg | PREFILLED_SYRINGE | Freq: Once | SUBCUTANEOUS | Status: AC
  Administered 2022-04-29: 6 mg via SUBCUTANEOUS
  Filled 2022-04-29: qty 0.6

## 2022-05-01 ENCOUNTER — Emergency Department
Admission: EM | Admit: 2022-05-01 | Discharge: 2022-05-02 | Disposition: A | Discharge status: Home / Self Care | Payer: BC Managed Care – PPO | Source: Home / Self Care | Attending: Emergency Medicine | Admitting: Emergency Medicine

## 2022-05-01 ENCOUNTER — Other Ambulatory Visit: Payer: Self-pay

## 2022-05-01 ENCOUNTER — Emergency Department: Payer: BC Managed Care – PPO

## 2022-05-01 DIAGNOSIS — T451X5A Adverse effect of antineoplastic and immunosuppressive drugs, initial encounter: Secondary | ICD-10-CM | POA: Insufficient documentation

## 2022-05-01 DIAGNOSIS — K922 Gastrointestinal hemorrhage, unspecified: Secondary | ICD-10-CM | POA: Diagnosis not present

## 2022-05-01 DIAGNOSIS — D649 Anemia, unspecified: Secondary | ICD-10-CM | POA: Insufficient documentation

## 2022-05-01 DIAGNOSIS — R531 Weakness: Secondary | ICD-10-CM

## 2022-05-01 DIAGNOSIS — C7A8 Other malignant neuroendocrine tumors: Secondary | ICD-10-CM | POA: Diagnosis not present

## 2022-05-01 LAB — COMPREHENSIVE METABOLIC PANEL
ALT: 11 U/L (ref 0–44)
AST: 18 U/L (ref 15–41)
Albumin: 3.1 g/dL — ABNORMAL LOW (ref 3.5–5.0)
Alkaline Phosphatase: 58 U/L (ref 38–126)
Anion gap: 10 (ref 5–15)
BUN: 30 mg/dL — ABNORMAL HIGH (ref 8–23)
CO2: 23 mmol/L (ref 22–32)
Calcium: 8.2 mg/dL — ABNORMAL LOW (ref 8.9–10.3)
Chloride: 102 mmol/L (ref 98–111)
Creatinine, Ser: 0.68 mg/dL (ref 0.44–1.00)
GFR, Estimated: >60 mL/min (ref >60)
Glucose, Bld: 101 mg/dL — ABNORMAL HIGH (ref 70–99)
Potassium: 3.4 mmol/L — ABNORMAL LOW (ref 3.5–5.1)
Sodium: 135 mmol/L (ref 135–145)
Total Bilirubin: 0.9 mg/dL (ref 0.3–1.2)
Total Protein: 5.6 g/dL — ABNORMAL LOW (ref 6.5–8.1)

## 2022-05-01 LAB — CBC WITH DIFFERENTIAL/PLATELET
Abs Immature Granulocytes: 4.63 10*3/uL — ABNORMAL HIGH (ref 0.00–0.07)
Basophils Absolute: 0.2 10*3/uL — ABNORMAL HIGH (ref 0.0–0.1)
Basophils Relative: 0 %
Eosinophils Absolute: 0 10*3/uL (ref 0.0–0.5)
Eosinophils Relative: 0 %
HCT: 18.5 % — ABNORMAL LOW (ref 36.0–46.0)
Hemoglobin: 6.1 g/dL — ABNORMAL LOW (ref 12.0–15.0)
Immature Granulocytes: 12 %
Lymphocytes Relative: 7 %
Lymphs Abs: 2.7 10*3/uL (ref 0.7–4.0)
MCH: 30.3 pg (ref 26.0–34.0)
MCHC: 33 g/dL (ref 30.0–36.0)
MCV: 92 fL (ref 80.0–100.0)
Monocytes Absolute: 0.1 10*3/uL (ref 0.1–1.0)
Monocytes Relative: 0 %
Neutro Abs: 31.3 10*3/uL — ABNORMAL HIGH (ref 1.7–7.7)
Neutrophils Relative %: 81 %
Platelets: 321 10*3/uL (ref 150–400)
RBC: 2.01 MIL/uL — ABNORMAL LOW (ref 3.87–5.11)
RDW: 20 % — ABNORMAL HIGH (ref 11.5–15.5)
Smear Review: NORMAL
WBC: 39 10*3/uL — ABNORMAL HIGH (ref 4.0–10.5)
nRBC: 0 % (ref 0.0–0.2)

## 2022-05-01 LAB — PREPARE RBC (CROSSMATCH)

## 2022-05-01 LAB — URINALYSIS, ROUTINE W REFLEX MICROSCOPIC
Bilirubin Urine: NEGATIVE
Glucose, UA: NEGATIVE mg/dL
Hgb urine dipstick: NEGATIVE
Ketones, ur: NEGATIVE mg/dL
Nitrite: NEGATIVE
Protein, ur: NEGATIVE mg/dL
Specific Gravity, Urine: 1.008 (ref 1.005–1.030)
Squamous Epithelial / HPF: NONE SEEN (ref 0–5)
pH: 8 (ref 5.0–8.0)

## 2022-05-01 LAB — APTT: aPTT: 22 seconds — ABNORMAL LOW (ref 24–36)

## 2022-05-01 LAB — PROTIME-INR
INR: 1.1 (ref 0.8–1.2)
Prothrombin Time: 13.6 seconds (ref 11.4–15.2)

## 2022-05-01 LAB — LIPASE, BLOOD: Lipase: 34 U/L (ref 11–51)

## 2022-05-01 LAB — MAGNESIUM: Magnesium: 2.1 mg/dL (ref 1.7–2.4)

## 2022-05-01 MED ORDER — SODIUM CHLORIDE 0.9 % IV SOLN
10.0000 mL/h | Freq: Once | INTRAVENOUS | Status: AC
  Administered 2022-05-01: 10 mL/h via INTRAVENOUS

## 2022-05-01 MED ORDER — LACTATED RINGERS IV BOLUS
1000.0000 mL | Freq: Once | INTRAVENOUS | Status: AC
  Administered 2022-05-01: 1000 mL via INTRAVENOUS

## 2022-05-01 MED ORDER — METOCLOPRAMIDE HCL 5 MG/ML IJ SOLN
10.0000 mg | Freq: Once | INTRAMUSCULAR | Status: AC
  Administered 2022-05-01: 10 mg via INTRAVENOUS
  Filled 2022-05-01: qty 2

## 2022-05-01 NOTE — ED Notes (Signed)
Pt actively vomiting at this time.

## 2022-05-01 NOTE — ED Notes (Signed)
Blood consent signed in paper format d/t signature pad failure.

## 2022-05-01 NOTE — ED Provider Notes (Signed)
[facility] Provider Note    Event Date/Time   First MD Initiated Contact with Patient 05/01/22 1938     (approximate)   History   Weakness   HPI  Ariana Wilson is a 61 y.o. female who presents to the ED for evaluation of Weakness   I reviewed oncology clinic visit from 8/14.  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 Undergoing chemo with cisplatin and etoposide.  10 of Decadron with infusion performed 3 days ago.  Patient presents to the ED with her husband for evaluation of malaise, orthostasis, presyncopal dizziness and generalized weakness.  Had 1 episode of emesis yesterday, but this has not recurred.  Denies any abdominal pain, fever, chest pain, cough, shortness of breath, dysuria.   They report that they have felt similar symptoms in the past in the setting of her chemotherapy and symptoms resolved with IV fluids in the past.  Denies any bleeding symptoms such as melena, hematochezia, hematemesis, bleeding gums or nose or hematuria.   Physical Exam   Triage Vital Signs: ED Triage Vitals [05/01/22 1932]  Enc Vitals Group     BP (!) 74/40     Pulse Rate 92     Resp 20     Temp 98.3 F (36.8 C)     Temp src      SpO2 100 %     Weight 149 lb (67.6 kg)     Height '5\' 1"'$  (1.549 m)     Head Circumference      Peak Flow      Pain Score      Pain Loc      Pain Edu?      Excl. in [place]?     Most recent vital signs: Vitals:   05/01/22 2219 05/01/22 2243  BP: (!) 108/56 100/77  Pulse: 95 100  Resp: 12 20  Temp: 98.4 F (36.9 C) 98.4 F (36.9 C)  SpO2: 98% 100%    General: Awake, no distress.  Well-appearing and conversational. CV:  Good peripheral perfusion.  Resp:  Normal effort.  Abd:  No distention.  Soft and benign throughout MSK:  No deformity noted.  Neuro:  No focal deficits appreciated. Cranial nerves II through XII intact 5/5 strength and sensation in all 4 extremities Other:     ED Results /  Procedures / Treatments   Labs (all labs ordered are listed, but only abnormal results are displayed) Labs Reviewed  COMPREHENSIVE METABOLIC PANEL - Abnormal; Notable for the following components:      Result Value   Potassium 3.4 (*)    Glucose, Bld 101 (*)    BUN 30 (*)    Calcium 8.2 (*)    Total Protein 5.6 (*)    Albumin 3.1 (*)    All other components within normal limits  CBC WITH DIFFERENTIAL/PLATELET - Abnormal; Notable for the following components:   WBC 39.0 (*)    RBC 2.01 (*)    Hemoglobin 6.1 (*)    HCT 18.5 (*)    RDW 20.0 (*)    Neutro Abs 31.3 (*)    Basophils Absolute 0.2 (*)    Abs Immature Granulocytes 4.63 (*)    All other components within normal limits  URINALYSIS, ROUTINE W REFLEX MICROSCOPIC - Abnormal; Notable for the following components:   Color, Urine STRAW (*)    APPearance CLEAR (*)    Leukocytes,Ua TRACE (*)    Bacteria, UA RARE (*)  All other components within normal limits  APTT - Abnormal; Notable for the following components:   aPTT 22 (*)    All other components within normal limits  MAGNESIUM  LIPASE, BLOOD  PROTIME-INR  TYPE AND SCREEN  PREPARE RBC (CROSSMATCH)    EKG   RADIOLOGY CXR interpreted by me without evidence of acute cardiopulmonary pathology.  Official radiology report(s): DG Chest Portable 1 View  Result Date: 05/01/2022 CLINICAL DATA:  Hypotension, weakness, history of esophageal carcinoma EXAM: PORTABLE CHEST 1 VIEW COMPARISON:  04/13/2022 FINDINGS: The heart size and mediastinal contours are within normal limits. Both lungs are clear. The visualized skeletal structures are unremarkable. Tip of right IJ chest port is seen in superior vena cava. IMPRESSION: No active disease. Electronically Signed   By: Elmer Picker M.D.   On: 05/01/2022 20:24    PROCEDURES and INTERVENTIONS:  .1-3 Lead EKG Interpretation  Performed by: Vladimir Crofts, MD Authorized by: Vladimir Crofts, MD     Interpretation: normal      ECG rate:  90   ECG rate assessment: normal     Rhythm: sinus rhythm     Ectopy: none     Conduction: normal   .Critical Care  Performed by: Vladimir Crofts, MD Authorized by: Vladimir Crofts, MD   Critical care provider statement:    Critical care time (minutes):  30   Critical care time was exclusive of:  Separately billable procedures and treating other patients   Critical care was necessary to treat or prevent imminent or life-threatening deterioration of the following conditions:  Circulatory failure   Critical care was time spent personally by me on the following activities:  Development of treatment plan with patient or surrogate, discussions with consultants, evaluation of patient's response to treatment, examination of patient, ordering and review of laboratory studies, ordering and review of radiographic studies, ordering and performing treatments and interventions, pulse oximetry, re-evaluation of patient's condition and review of old charts   Medications  0.9 %  sodium chloride infusion (has no administration in time range)  lactated ringers bolus 1,000 mL (0 mLs Intravenous Stopped 05/01/22 2220)  metoCLOPramide (REGLAN) injection 10 mg (10 mg Intravenous Given 05/01/22 2206)     IMPRESSION / MDM / [place] / ED COURSE  I reviewed the triage vital signs and the nursing notes.  Differential diagnosis includes, but is not limited to, symptomatic anemia, dehydration, AKI, sepsis  {Patient presents with symptoms of an acute illness or injury that is potentially life-threatening.  Pleasant 61 year old woman undergoing chemotherapy for esophageal cancer presents to the ED with generalized weakness and presyncope, likely symptomatic anemia requiring blood transfusions and fluid resuscitation.  First BP in triage was noted to be hypotensive that does not recur after fluids are initiated.  Blood work with normocytic anemia, likely due to her chemotherapy regimen.  Significant  leukocytosis is noted, similar to previous readings in the past in the setting of her Decadron associated with chemotherapy.  Doubt sepsis or infectious etiology of her symptoms as she has no focal symptoms and has a reassuring work-up otherwise.  Urine is clear and CXR is clear.  Metabolic panel with marginal hypokalemia, for which she received LR.  Normal lipase and magnesium.  Due to her concurrent fluid resuscitation, anticipate her hemoglobin will drop below 6 with this and after discussing with her we decided upon transfusing 2 units of PRBCs, reassessing with the possibility of outpatient management after this as long as this is well-tolerated.  She is  signed out to oncoming provider to follow-up on her clinical status.      FINAL CLINICAL IMPRESSION(S) / ED DIAGNOSES   Final diagnoses:  Symptomatic anemia  Generalized weakness  Adverse effect of chemotherapy, initial encounter     Rx / DC Orders   ED Discharge Orders     None        Note:  This document was prepared using Dragon voice recognition software and may include unintentional dictation errors.   Vladimir Crofts, MD 05/01/22 [phone]

## 2022-05-01 NOTE — ED Triage Notes (Signed)
Pt is receiving chemo for cancer, last treatment on Thursday. Pt is hypotensive. Pt states she has been vomiting, feels weak, lightheaded. Pt appears pale and ill.

## 2022-05-02 ENCOUNTER — Other Ambulatory Visit: Payer: Self-pay | Admitting: *Deleted

## 2022-05-02 ENCOUNTER — Telehealth: Payer: Self-pay | Admitting: *Deleted

## 2022-05-02 ENCOUNTER — Telehealth: Payer: Self-pay | Admitting: Nurse Practitioner

## 2022-05-02 DIAGNOSIS — C7A8 Other malignant neuroendocrine tumors: Secondary | ICD-10-CM

## 2022-05-02 DIAGNOSIS — D649 Anemia, unspecified: Secondary | ICD-10-CM

## 2022-05-02 MED ORDER — OLANZAPINE 10 MG PO TABS: 10.0000 mg | ORAL_TABLET | Freq: Every day | ORAL | 1 refills | Status: DC

## 2022-05-02 MED ORDER — ONDANSETRON 4 MG PO TBDP: 4.0000 mg | ORAL_TABLET | Freq: Three times a day (TID) | ORAL | 0 refills | Status: DC | PRN

## 2022-05-02 NOTE — ED Provider Notes (Signed)
Pt signed out to me pending prbc transfusion and dc. Pt completed 2 units in the ED and would still like to go home. No infectious symptoms. Advised that she f/u with her Oncologist either today or tomorrow and return to ED for fever or any new or worsening symptoms.    Rada Hay, MD 05/02/22 [phone]

## 2022-05-02 NOTE — Telephone Encounter (Signed)
Received call from patient reporting near syncope & weakness. She has already called 911 and is waiting for pickup. I agreed with evaluation in ER. Will notify Dr. Janese Banks.

## 2022-05-02 NOTE — Telephone Encounter (Signed)
Pt was contacted by S. Venable and added to smc schedule for 8/22.

## 2022-05-02 NOTE — Telephone Encounter (Signed)
Patient called reporting she is having uncontrolled severe nausea an would like to know what to do for it. States she was in ER last evening also

## 2022-05-03 ENCOUNTER — Emergency Department: Payer: BC Managed Care – PPO

## 2022-05-03 ENCOUNTER — Other Ambulatory Visit: Payer: Self-pay

## 2022-05-03 ENCOUNTER — Inpatient Hospital Stay: Payer: BC Managed Care – PPO

## 2022-05-03 ENCOUNTER — Inpatient Hospital Stay
Admission: EM | Admit: 2022-05-03 | Discharge: 2022-05-06 | DRG: 843 | Disposition: A | Discharge status: Home / Self Care | Payer: BC Managed Care – PPO | Source: Ambulatory Visit | Attending: Internal Medicine | Admitting: Internal Medicine

## 2022-05-03 ENCOUNTER — Encounter: Payer: Self-pay | Admitting: Family Medicine

## 2022-05-03 ENCOUNTER — Inpatient Hospital Stay (HOSPITAL_BASED_OUTPATIENT_CLINIC_OR_DEPARTMENT_OTHER): Payer: BC Managed Care – PPO | Admitting: Hospice and Palliative Medicine

## 2022-05-03 VITALS — BP 89/44 | HR 67 | Resp 22

## 2022-05-03 DIAGNOSIS — D849 Immunodeficiency, unspecified: Secondary | ICD-10-CM | POA: Diagnosis present

## 2022-05-03 DIAGNOSIS — E785 Hyperlipidemia, unspecified: Secondary | ICD-10-CM | POA: Diagnosis present

## 2022-05-03 DIAGNOSIS — Z90711 Acquired absence of uterus with remaining cervical stump: Secondary | ICD-10-CM | POA: Diagnosis not present

## 2022-05-03 DIAGNOSIS — R571 Hypovolemic shock: Secondary | ICD-10-CM | POA: Diagnosis present

## 2022-05-03 DIAGNOSIS — E860 Dehydration: Secondary | ICD-10-CM

## 2022-05-03 DIAGNOSIS — D62 Acute posthemorrhagic anemia: Secondary | ICD-10-CM | POA: Diagnosis present

## 2022-05-03 DIAGNOSIS — Z8616 Personal history of COVID-19: Secondary | ICD-10-CM | POA: Diagnosis not present

## 2022-05-03 DIAGNOSIS — Z83438 Family history of other disorder of lipoprotein metabolism and other lipidemia: Secondary | ICD-10-CM | POA: Diagnosis not present

## 2022-05-03 DIAGNOSIS — E039 Hypothyroidism, unspecified: Secondary | ICD-10-CM | POA: Diagnosis present

## 2022-05-03 DIAGNOSIS — K921 Melena: Secondary | ICD-10-CM | POA: Diagnosis present

## 2022-05-03 DIAGNOSIS — E876 Hypokalemia: Secondary | ICD-10-CM | POA: Diagnosis present

## 2022-05-03 DIAGNOSIS — R578 Other shock: Secondary | ICD-10-CM | POA: Diagnosis present

## 2022-05-03 DIAGNOSIS — C7A8 Other malignant neuroendocrine tumors: Secondary | ICD-10-CM

## 2022-05-03 DIAGNOSIS — K219 Gastro-esophageal reflux disease without esophagitis: Secondary | ICD-10-CM | POA: Diagnosis present

## 2022-05-03 DIAGNOSIS — D509 Iron deficiency anemia, unspecified: Secondary | ICD-10-CM | POA: Diagnosis present

## 2022-05-03 DIAGNOSIS — R531 Weakness: Secondary | ICD-10-CM

## 2022-05-03 DIAGNOSIS — I4891 Unspecified atrial fibrillation: Secondary | ICD-10-CM

## 2022-05-03 DIAGNOSIS — L658 Other specified nonscarring hair loss: Secondary | ICD-10-CM | POA: Diagnosis present

## 2022-05-03 DIAGNOSIS — T451X5A Adverse effect of antineoplastic and immunosuppressive drugs, initial encounter: Secondary | ICD-10-CM | POA: Diagnosis present

## 2022-05-03 DIAGNOSIS — K922 Gastrointestinal hemorrhage, unspecified: Secondary | ICD-10-CM | POA: Diagnosis present

## 2022-05-03 DIAGNOSIS — Z823 Family history of stroke: Secondary | ICD-10-CM

## 2022-05-03 DIAGNOSIS — Z79899 Other long term (current) drug therapy: Secondary | ICD-10-CM | POA: Diagnosis not present

## 2022-05-03 DIAGNOSIS — E538 Deficiency of other specified B group vitamins: Secondary | ICD-10-CM | POA: Diagnosis present

## 2022-05-03 DIAGNOSIS — Z8249 Family history of ischemic heart disease and other diseases of the circulatory system: Secondary | ICD-10-CM

## 2022-05-03 DIAGNOSIS — Z808 Family history of malignant neoplasm of other organs or systems: Secondary | ICD-10-CM

## 2022-05-03 DIAGNOSIS — D649 Anemia, unspecified: Principal | ICD-10-CM

## 2022-05-03 DIAGNOSIS — E871 Hypo-osmolality and hyponatremia: Secondary | ICD-10-CM | POA: Diagnosis present

## 2022-05-03 LAB — CBC WITH DIFFERENTIAL/PLATELET
Abs Immature Granulocytes: 0.11 10*3/uL — ABNORMAL HIGH (ref 0.00–0.07)
Basophils Absolute: 0.1 10*3/uL (ref 0.0–0.1)
Basophils Relative: 2 %
Eosinophils Absolute: 0 10*3/uL (ref 0.0–0.5)
Eosinophils Relative: 0 %
HCT: 19.1 % — ABNORMAL LOW (ref 36.0–46.0)
Hemoglobin: 6.6 g/dL — ABNORMAL LOW (ref 12.0–15.0)
Immature Granulocytes: 2 %
Lymphocytes Relative: 16 %
Lymphs Abs: 1.1 10*3/uL (ref 0.7–4.0)
MCH: 29.9 pg (ref 26.0–34.0)
MCHC: 34.6 g/dL (ref 30.0–36.0)
MCV: 86.4 fL (ref 80.0–100.0)
Monocytes Absolute: 0.2 10*3/uL (ref 0.1–1.0)
Monocytes Relative: 4 %
Neutro Abs: 5.2 10*3/uL (ref 1.7–7.7)
Neutrophils Relative %: 76 %
Platelets: 139 10*3/uL — ABNORMAL LOW (ref 150–400)
RBC: 2.21 MIL/uL — ABNORMAL LOW (ref 3.87–5.11)
RDW: 17.2 % — ABNORMAL HIGH (ref 11.5–15.5)
Smear Review: NORMAL
WBC: 6.8 10*3/uL (ref 4.0–10.5)
nRBC: 0 % (ref 0.0–0.2)

## 2022-05-03 LAB — BPAM RBC
Blood Product Expiration Date: 202309052359
Blood Product Expiration Date: 202309082359
ISSUE DATE / TIME: 202308202211
ISSUE DATE / TIME: 202308210055
Unit Type and Rh: 5100
Unit Type and Rh: 9500

## 2022-05-03 LAB — COMPREHENSIVE METABOLIC PANEL
ALT: 10 U/L (ref 0–44)
AST: 15 U/L (ref 15–41)
Albumin: 2.8 g/dL — ABNORMAL LOW (ref 3.5–5.0)
Alkaline Phosphatase: 53 U/L (ref 38–126)
Anion gap: 7 (ref 5–15)
BUN: 36 mg/dL — ABNORMAL HIGH (ref 8–23)
CO2: 24 mmol/L (ref 22–32)
Calcium: 7.6 mg/dL — ABNORMAL LOW (ref 8.9–10.3)
Chloride: 101 mmol/L (ref 98–111)
Creatinine, Ser: 0.62 mg/dL (ref 0.44–1.00)
GFR, Estimated: >60 mL/min (ref >60)
Glucose, Bld: 121 mg/dL — ABNORMAL HIGH (ref 70–99)
Potassium: 3.1 mmol/L — ABNORMAL LOW (ref 3.5–5.1)
Sodium: 132 mmol/L — ABNORMAL LOW (ref 135–145)
Total Bilirubin: 0.9 mg/dL (ref 0.3–1.2)
Total Protein: 5.1 g/dL — ABNORMAL LOW (ref 6.5–8.1)

## 2022-05-03 LAB — PROCALCITONIN: Procalcitonin: 0.13 ng/mL

## 2022-05-03 LAB — PREPARE RBC (CROSSMATCH)

## 2022-05-03 LAB — URINALYSIS, ROUTINE W REFLEX MICROSCOPIC
Bilirubin Urine: NEGATIVE
Glucose, UA: NEGATIVE mg/dL
Hgb urine dipstick: NEGATIVE
Ketones, ur: NEGATIVE mg/dL
Nitrite: NEGATIVE
Protein, ur: NEGATIVE mg/dL
Specific Gravity, Urine: 1.008 (ref 1.005–1.030)
pH: 6 (ref 5.0–8.0)

## 2022-05-03 LAB — TYPE AND SCREEN
ABO/RH(D): B POS
Antibody Screen: NEGATIVE
Unit division: 0
Unit division: 0

## 2022-05-03 LAB — GLUCOSE, CAPILLARY: Glucose-Capillary: 85 mg/dL (ref 70–99)

## 2022-05-03 LAB — LACTIC ACID, PLASMA
Lactic Acid, Venous: 1.3 mmol/L (ref 0.5–1.9)
Lactic Acid, Venous: 1 mmol/L (ref 0.5–1.9)

## 2022-05-03 LAB — MRSA NEXT GEN BY PCR, NASAL: MRSA by PCR Next Gen: NOT DETECTED

## 2022-05-03 LAB — HEMOGLOBIN AND HEMATOCRIT, BLOOD
HCT: 29.3 % — ABNORMAL LOW (ref 36.0–46.0)
Hemoglobin: 9.8 g/dL — ABNORMAL LOW (ref 12.0–15.0)

## 2022-05-03 MED ORDER — PIPERACILLIN-TAZOBACTAM 3.375 G IVPB
3.3750 g | Freq: Three times a day (TID) | INTRAVENOUS | Status: DC
  Administered 2022-05-03 – 2022-05-05 (×6): 3.375 g via INTRAVENOUS
  Filled 2022-05-03 (×6): qty 50

## 2022-05-03 MED ORDER — PANTOPRAZOLE SODIUM 40 MG IV SOLR
40.0000 mg | Freq: Two times a day (BID) | INTRAVENOUS | Status: DC
  Administered 2022-05-03 – 2022-05-05 (×5): 40 mg via INTRAVENOUS
  Filled 2022-05-03 (×5): qty 10

## 2022-05-03 MED ORDER — ONDANSETRON HCL 4 MG/2ML IJ SOLN
4.0000 mg | Freq: Once | INTRAMUSCULAR | Status: DC
  Filled 2022-05-03: qty 2

## 2022-05-03 MED ORDER — SODIUM CHLORIDE 0.9 % IV SOLN
50.0000 ug/h | INTRAVENOUS | Status: DC
  Administered 2022-05-03 – 2022-05-05 (×4): 50 ug/h via INTRAVENOUS
  Filled 2022-05-03 (×7): qty 1

## 2022-05-03 MED ORDER — OCTREOTIDE LOAD VIA INFUSION
50.0000 ug | Freq: Once | INTRAVENOUS | Status: AC
  Administered 2022-05-03: 50 ug via INTRAVENOUS
  Filled 2022-05-03: qty 25

## 2022-05-03 MED ORDER — POTASSIUM CHLORIDE CRYS ER 20 MEQ PO TBCR
40.0000 meq | EXTENDED_RELEASE_TABLET | Freq: Once | ORAL | Status: AC
  Administered 2022-05-03: 40 meq via ORAL
  Filled 2022-05-03: qty 2

## 2022-05-03 MED ORDER — NOREPINEPHRINE 4 MG/250ML-% IV SOLN
0.0000 ug/min | INTRAVENOUS | Status: DC
  Administered 2022-05-03: 2 ug/min via INTRAVENOUS
  Administered 2022-05-04: 4 ug/min via INTRAVENOUS
  Filled 2022-05-03 (×2): qty 250

## 2022-05-03 MED ORDER — SODIUM CHLORIDE 0.9% IV SOLUTION: Freq: Once | INTRAVENOUS | Status: AC

## 2022-05-03 MED ORDER — POLYETHYLENE GLYCOL 3350 17 G PO PACK: 17.0000 g | PACK | Freq: Every day | ORAL | Status: DC | PRN

## 2022-05-03 MED ORDER — SODIUM CHLORIDE 0.9 % IV BOLUS
1000.0000 mL | Freq: Once | INTRAVENOUS | Status: AC
  Administered 2022-05-03: 1000 mL via INTRAVENOUS

## 2022-05-03 MED ORDER — DOCUSATE SODIUM 100 MG PO CAPS
100.0000 mg | ORAL_CAPSULE | Freq: Two times a day (BID) | ORAL | Status: DC | PRN
Start: 2022-05-03 — End: 2022-05-06

## 2022-05-03 MED ORDER — IOHEXOL 350 MG/ML SOLN
80.0000 mL | Freq: Once | INTRAVENOUS | Status: AC | PRN
Start: 2022-05-03 — End: 2022-05-03
  Administered 2022-05-03: 80 mL via INTRAVENOUS

## 2022-05-03 NOTE — ED Notes (Signed)
Continue to be at pt's bedside, Map approx 55-65 MD has been to bedside several times as well and is aware. Pt tolerating blood transfusion.

## 2022-05-03 NOTE — ED Notes (Signed)
Spoke with pharmacy verified med bolus over 15 min verified pump with RN Deneise Lever. Levo titrated per MAR. Pt continues to feel "well". Family at bedside.

## 2022-05-03 NOTE — Progress Notes (Signed)
V/o from Ariana Pew, NP- patient needs an EKG today for afib

## 2022-05-03 NOTE — Consult Note (Signed)
Pharmacy Antibiotic Note  Ariana Wilson is a 61 y.o. 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 Pharmacy has been consulted for Zosyn dosing.  Plan: Zosyn 3.375g IV q8h (4 hour infusion).  Temp (24hrs), Avg:97.8 F (36.6 C), Min:97.7 F (36.5 C), Max:98.2 F (36.8 C)  Recent Labs  Lab 05/01/22 1940 05/03/22 1128  WBC 39.0* 6.8  CREATININE 0.68 0.62  LATICACIDVEN  --  1.3    Estimated Creatinine Clearance: 64.9 mL/min (by C-G formula based on SCr of 0.62 mg/dL).    Allergies  Allergen Reactions   Sulfa Antibiotics Hives    Says her tongue swelled a bit   Sulfonamide Derivatives Swelling    Tongue swelling    Antimicrobials this admission: Zosyn 8/22 >>   Dose adjustments this admission: N/A  Microbiology results: N/A  Thank you for allowing pharmacy to be a part of this patient's care.  Benita Gutter 05/03/2022 4:58 PM

## 2022-05-03 NOTE — ED Notes (Signed)
Report given to ICU RN pt with rn transport to unit.

## 2022-05-03 NOTE — ED Provider Notes (Addendum)
[facility] Provider Note    Event Date/Time   First MD Initiated Contact with Patient 05/03/22 1105     (approximate)   History   Hypotension   HPI  Ariana Wilson is a 61 y.o. female   Past medical history of neuroendocrine cancer GE junction on chemo, AF on flecanide, no anticoagulation Presents with generalized weakness and n/v.  Was in the emergency department 2 days ago and was found to be anemic thought to be due to her chemotherapy and received 2 units of packed red blood cells.  That time, she has felt progressively weaker, fatigued, and has had nausea and vomiting that she attributes to her chemotherapy which she had her last session last week.  Denies fever or chills.  No chest pain or shortness of breath.  She denies blood in the stool.  No dysuria.  No diarrhea.  Nausea and vomiting without blood in the emesis.  History was obtained via patient and her partner who was at bedside.  Also review of external notes for past medical history      Physical Exam   Triage Vital Signs: ED Triage Vitals  Enc Vitals Group     BP 05/03/22 1111 (!) 89/57     Pulse Rate 05/03/22 1111 77     Resp 05/03/22 1111 13     Temp 05/03/22 1117 98.2 F (36.8 C)     Temp src --      SpO2 05/03/22 1111 98 %     Weight --      Height --      Head Circumference --      Peak Flow --      Pain Score 05/03/22 1112 0     Pain Loc --      Pain Edu? --      Excl. in [place]? --     Most recent vital signs: Vitals:   05/03/22 1453 05/03/22 1508  BP: (!) 77/44 (!) 83/44  Pulse: 65 61  Resp: 14 14  Temp: 97.7 F (36.5 C) 97.8 F (36.6 C)  SpO2: 100% 100%    General: Awake, no distress.  Pleasant and cooperative CV:  Hypotensive 89/57, normal rate 77, skin appears slightly pale.  Mucous membranes are dry. Resp:  Normal effort.  Clear to auscultation bilateral without wheeze or focalities Abd:  No distention.  No rigidity or guarding Other:  Rectal exam with  formed dark stools which are guaiac positive.   ED Results / Procedures / Treatments   Labs (all labs ordered are listed, but only abnormal results are displayed) Labs Reviewed  COMPREHENSIVE METABOLIC PANEL - Abnormal; Notable for the following components:      Result Value   Sodium 132 (*)    Potassium 3.1 (*)    Glucose, Bld 121 (*)    BUN 36 (*)    Calcium 7.6 (*)    Total Protein 5.1 (*)    Albumin 2.8 (*)    All other components within normal limits  CBC WITH DIFFERENTIAL/PLATELET - Abnormal; Notable for the following components:   RBC 2.21 (*)    Hemoglobin 6.6 (*)    HCT 19.1 (*)    RDW 17.2 (*)    Platelets 139 (*)    Abs Immature Granulocytes 0.11 (*)    All other components within normal limits  URINALYSIS, ROUTINE W REFLEX MICROSCOPIC - Abnormal; Notable for the following components:   Color, Urine STRAW (*)  APPearance CLEAR (*)    Leukocytes,Ua TRACE (*)    Bacteria, UA RARE (*)    All other components within normal limits  LACTIC ACID, PLASMA  LACTIC ACID, PLASMA  TYPE AND SCREEN  PREPARE RBC (CROSSMATCH)     I reviewed labs and they are notable for anemia in the 6s, which is unchanged from 2 days ago despite packed red blood cell transfusion.  EKG  ED ECG REPORT I, Lucillie Garfinkel, the attending physician, personally viewed and interpreted this ECG.   Date: 05/03/2022  EKG Time: 1111   Rate: 70  Rhythm: normal EKG, normal sinus rhythm, unchanged from previous tracings  Axis: normal  Intervals:none  ST&T Change: non ischemia    RADIOLOGY I dependently reviewed and interpreted no overt consolidations or pneumothorax   PROCEDURES:  Critical Care performed: Yes, see critical care procedure note(s)  .Critical Care  Performed by: Lucillie Garfinkel, MD Authorized by: Lucillie Garfinkel, MD   Critical care provider statement:    Critical care time (minutes):  30   Critical care was necessary to treat or prevent imminent or life-threatening deterioration  of the following conditions:  Circulatory failure and dehydration   Critical care was time spent personally by me on the following activities:  Ordering and performing treatments and interventions, development of treatment plan with patient or surrogate, discussions with consultants, evaluation of patient's response to treatment, examination of patient, obtaining history from patient or surrogate, review of old charts, re-evaluation of patient's condition, ordering and review of radiographic studies and ordering and review of laboratory studies   Care discussed with: admitting provider      MEDICATIONS ORDERED IN ED: Medications  ondansetron (ZOFRAN) injection 4 mg (0 mg Intravenous Hold 05/03/22 1304)  0.9 %  sodium chloride infusion (Manually program via Guardrails IV Fluids) (has no administration in time range)  pantoprazole (PROTONIX) injection 40 mg (40 mg Intravenous Given 05/03/22 1309)  norepinephrine (LEVOPHED) '4mg'$  in 293m (0.016 mg/mL) premix infusion (2 mcg/min Intravenous New Bag/Given 05/03/22 1552)  octreotide (SANDOSTATIN) 2 mcg/mL load via infusion 50 mcg (has no administration in time range)    And  octreotide (SANDOSTATIN) 500 mcg in sodium chloride 0.9 % 250 mL (2 mcg/mL) infusion (has no administration in time range)  sodium chloride 0.9 % bolus 1,000 mL (0 mLs Intravenous Stopped 05/03/22 1240)  potassium chloride SA (KLOR-CON M) CR tablet 40 mEq (40 mEq Oral Given 05/03/22 1309)  iohexol (OMNIPAQUE) 350 MG/ML injection 80 mL (80 mLs Intravenous Contrast Given 05/03/22 1409)    Consultants:  I spoke with specialist regarding admission and regarding care plan for this patient.   IMPRESSION / MDM / ASSESSMENT AND PLAN / ED COURSE  I reviewed the triage vital signs and the nursing notes.                              Differential diagnosis includes, but is not limited to, anemia, infection, GI bleeding, conditioning, chemotherapy side effect.   The patient is on the  cardiac monitor to evaluate for evidence of arrhythmia and/or significant heart rate changes.  MDM: This is a patient who is immunosuppressed and known to be anemic who presents with generalized weakness concern for infectious etiology though no localizing symptoms.  We will do infectious work-up, give fluids for likely dehydration in the setting of nausea and vomiting postchemotherapy.  She has been anemic but received 2 units of packed red blood cells will  assess for posttransfusion labs.'s  She is persistently anemic despite blood transfusion 2 days ago.  I suspect GI bleeding, occult.  Despite reports of no rectal bleeding, my rectal exam showed formed stools which are guaiac positive.  She has a GE junction tumor which may be the culprit bleed.  CT angiogram to assess.  Pressure is low but stable and she appears stable on my reassessment.  Transfusion ordered.  CT angiogram as above.  Protonix ordered.  Admission.  4:06 PM Spoke with hospitalist regarding admission.  However now she is status post 2 L crystalloid in the emergency department and continuing to be hypotensive. MICU needs to eval.  She is starting to get her blood now.  Maps in the 50s at times, start pressors, octreotide.  Consulted to GI at this time spoke with them, agree with pressors and octreotide and will get will scope during admission.  MICU consulted given pressor requirement now.   Patient's presentation is most consistent with acute presentation with potential threat to life or bodily function.       FINAL CLINICAL IMPRESSION(S) / ED DIAGNOSES   Final diagnoses:  Symptomatic anemia  Melena  Generalized weakness     Rx / DC Orders   ED Discharge Orders     None        Note:  This document was prepared using Dragon voice recognition software and may include unintentional dictation errors.    Lucillie Garfinkel, MD 05/03/22 1252    Lucillie Garfinkel, MD 05/03/22 [phone]

## 2022-05-03 NOTE — ED Triage Notes (Signed)
Pt arrives from chemo treatment center, was too hypotensive for chemo today. Pt weak, given approx 400 ML NS via accessed port right upper chest. Pt alert and oriented in NAD breathing e/u on room air. Will monitor.

## 2022-05-03 NOTE — ED Notes (Signed)
Pt states has had N/V this last weekend and seen in ED for blood transfusion. Per MD Jacelyn Grip at bedside ok to use accessed port for lab work.

## 2022-05-03 NOTE — Consult Note (Signed)
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 Patient ordered blood transfusion for anemia. Requiring vasopressors. Pharmacy consulted to assist with electrolyte monitoring and replacement as indicated.  Goal of Therapy:  Electrolytes within normal limits  Plan:  --K 3.1, KCl 40 mEq PO x 1 dose --Follow-up electrolytes with AM labs tomorrow  Benita Gutter 05/03/2022 4:24 PM

## 2022-05-03 NOTE — H&P (Signed)
NAME:  Ariana Wilson, MRN:  [id number], DOB:  Dec 23, 1960, LOS: 0 ADMISSION DATE:  05/03/2022, CONSULTATION DATE:  05/03/2022 REFERRING MD:  Dr. Jacelyn Grip , CHIEF COMPLAINT:  Generalized weakness, N/V, Hypotension   Brief Pt Description / Synopsis:  61 y.o. female admitted with Hypovolemic/Hemorrhagic shock due to suspected Acute GI Bleed from GE junction neuroendocrine tumor along with dehydration from nausea and vomiting.  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  She was seen in the emergency department 2 days ago and found to be anemic with hemoglobin of 6.1, felt to be attributed to her chemotherapy which she was given 2 units of packed red blood cells.  Since discharge from the ED she has felt progressively weaker, fatigued, along with nausea and vomiting (non-bloody) which she attributed to chemotherapy.  She denied chest pain, shortness of breath, cough, fever, chills, abdominal pain, hematemesis, hematochezia,  melena, dysuria, or diarrhea.  Of note, she was recently admitted at [facility] from 04/13/22 through 04/17/22 for Neutropenic fever (urine cx with staph haemolyticus), hypotension and symptomatic anemia requiring 1 unit pRBC's, and paroxsymal atrial fibrillation. She was discharged home on 7 day course of Levaquin.  ED Course: Initial Vital Signs: Temperature 98.2 F orally, pulse 77, respiratory rate 13, blood pressure 89/57, SPO2 98% Significant Labs: Sodium 132, potassium 3.1, glucose 121, BUN 36, creatinine 0.62, lactic acid 1.3, WBC 6.8, hemoglobin 6.6, hematocrit 19.1, MCV 86.4, MCHC 34.6, platelets 139, smear review with normal platelets and mild left shift Urinalysis negative for UTI Imaging Chest  X-ray>>IMPRESSION: No active cardiopulmonary disease. CT angio of abdomen and pelvis>>IMPRESSION: 1. No evidence of active GI bleed, sensitivity somewhat decreased by residual oral contrast material in the stomach and small bowel. 2. Some interval progression of mesenteric and retroperitoneal adenopathy since PET-CT 03/22/2022. 3. Mass at the GE junction as before. Medications Administered: 2 L normal saline bolus, 40 mg IV Protonix, octreotide, starting 1 unit PRBCs  Follow up labs showed that she remains persistently anemic despite having received 2 unit pRBC's, suspicion for occult GI bleeding.  ED provider performed rectal exam, which revealed dark stools which were guaiac positive.  Despite receiving IV fluids in the ED, she remained hypotensive of which Levophed was started in the ED.  PCCM is asked to admit the patient to ICU for further work-up and treatment.  Gastroenterology and oncology have been consulted.  Please see "significant events section" below for full detailed hospital course.    Pertinent  Medical History   Past Medical History:  Diagnosis Date   Anemia    Arthritis    Atrial fibrillation ([place])    B12 deficiency    Dysrhythmia    Hyperlipidemia    Hypothyroidism    Menopause    Palpitations    Hx of, 25 years ago   Vertigo     Micro Data:  N/A  Antimicrobials:  Zosyn 8/22>>  Significant Hospital Events: Including procedures, antibiotic start and stop dates in addition to other pertinent events   8/22: PCCM asked to admit due to need for vasopressors. GI and Oncology consulted.  Interim History / Subjective:  -Patient seen and examined in ICU -Patient is awake and alert, denies all complaints -Currently on 5 mics of levo with systolic blood pressure in  the 130s ~plan to wean -Has received 1 unit of blood, have ordered for 1 additional unit of irradiated PRBCs  Objective   Blood pressure (!) 104/58, pulse 64, temperature 97.8 F (36.6 C),  temperature source Oral, resp. rate 11, SpO2 100 %.       No intake or output data in the 24 hours ending 05/03/22 1627 There were no vitals filed for this visit.  Examination: General: Acute on chronically ill-appearing female, laying in bed, on room air, no acute distress HENT: Atraumatic, normocephalic, neck supple, no JVD Lungs: Clear breath sounds bilaterally, even, nonlabored Cardiovascular: Regular rate and rhythm, S1-S2, no murmurs, rubs, gallops Abdomen: Soft, nontender, nondistended, no guarding or rebound tenderness, bowel sounds positive x4 Extremities: Normal bulk and tone, no deformities, no edema Neuro: Awake and alert, oriented x4, moves all extremities to command, no focal deficits, pupils PERRL GU: Deferred  Resolved Hospital Problem list     Assessment & Plan:   #Hypovolemic/Hemorrhagic Shock due to suspected Acute GI Bleed & dehydration with N/V PMHx: Atrial fibrillation on flecainide (not on AC) -Continuous cardiac monitoring -Maintain MAP >65 -IV fluids -Transfusions as indicated -Vasopressors as needed to maintain MAP goal -Trend lactic acid until normalized  #Acute Blood Loss Anemia superimposed on Chronic Anemia #Suspected acute GI bleed in setting of GE junction Neuroendocrine tumor -Monitor for S/Sx of bleeding -Trend CBC (H&H q6h) -SCD's for VTE Prophylaxis (chemical prophylaxis contraindicated) -Transfuse for Hgb <8 -Protonix and Octreotide -CT Angiogram Abdomen is negative for active bleeding -GI consulted, appreciate input ~ tentative plan for EGD on 8/23 -Oncology consulted, appreciate input  #Mild Hypokalemia #Mild Hyponatremia, suspect Hypotonic Hypovolemic in setting of N/V -Monitor I&O's / urinary output -Follow BMP -Ensure adequate renal perfusion -Avoid nephrotoxic agents as able -Replace electrolytes as indicated -Pharmacy following for assistance with electrolyte replacement -IV fluids  Recent History of Neutropenic fever due  to Staph Haemolyticus UTI PMHx: immunosuppression due to chemo -Monitor fever curve -Trend WBC's & Procalcitonin -Follow cultures as above -CXR and urinalysis negative in ED -Discussed with Dr. Mortimer Fries, will empirically start Zosyn to cover potential intraabdominal process given shock state     Best Practice (right click and "Reselect all SmartList Selections" daily)   Diet/type: NPO DVT prophylaxis: SCD GI prophylaxis: PPI Lines: N/A Foley:  N/A Code Status:  full code Last date of multidisciplinary goals of care discussion [N/A]  Pt and her husband updated at bedside 8/22.  Labs   CBC: Recent Labs  Lab 05/01/22 1940 05/03/22 1128  WBC 39.0* 6.8  NEUTROABS 31.3* 5.2  HGB 6.1* 6.6*  HCT 18.5* 19.1*  MCV 92.0 86.4  PLT 321 139*    Basic Metabolic Panel: Recent Labs  Lab 05/01/22 1940 05/03/22 1128  NA 135 132*  K 3.4* 3.1*  CL 102 101  CO2 23 24  GLUCOSE 101* 121*  BUN 30* 36*  CREATININE 0.68 0.62  CALCIUM 8.2* 7.6*  MG 2.1  --    GFR: Estimated Creatinine Clearance: 64.9 mL/min (by C-G formula based on SCr of 0.62 mg/dL). Recent Labs  Lab 05/01/22 1940 05/03/22 1128  WBC 39.0* 6.8  LATICACIDVEN  --  1.3    Liver Function Tests: Recent Labs  Lab 05/01/22 1940 05/03/22 1128  AST 18 15  ALT 11 10  ALKPHOS 58 53  BILITOT 0.9 0.9  PROT 5.6* 5.1*  ALBUMIN 3.1* 2.8*   Recent Labs  Lab 05/01/22 1940  LIPASE 34   No results for input(s): "AMMONIA" in the  last 168 hours.  ABG No results found for: "PHART", "PCO2ART", "PO2ART", "HCO3", "TCO2", "ACIDBASEDEF", "O2SAT"   Coagulation Profile: Recent Labs  Lab 05/01/22 2044  INR 1.1    Cardiac Enzymes: No results for input(s): "CKTOTAL", "CKMB", "CKMBINDEX", "TROPONINI" in the last 168 hours.  HbA1C: No results found for: "HGBA1C"  CBG: No results for input(s): "GLUCAP" in the last 168 hours.  Review of Systems:   Positives in BOLD: Gen: Denies fever, chills, weight change, fatigue,  night sweats HEENT: Denies blurred vision, double vision, hearing loss, tinnitus, sinus congestion, rhinorrhea, sore throat, neck stiffness, dysphagia PULM: Denies shortness of breath, cough, sputum production, hemoptysis, wheezing CV: Denies chest pain, edema, orthopnea, paroxysmal nocturnal dyspnea, palpitations GI: Denies abdominal pain, nausea, vomiting, diarrhea, hematochezia, melena, constipation, change in bowel habits GU: Denies dysuria, hematuria, polyuria, oliguria, urethral discharge Endocrine: Denies hot or cold intolerance, polyuria, polyphagia or appetite change Derm: Denies rash, dry skin, scaling or peeling skin change Heme: Denies easy bruising, bleeding, bleeding gums Neuro: Denies headache, numbness, weakness, slurred speech, loss of memory or consciousness   Past Medical History:  She,  has a past medical history of Anemia, Arthritis, Atrial fibrillation (HCC), B12 deficiency, Dysrhythmia, Hyperlipidemia, Hypothyroidism, Menopause, Palpitations, and Vertigo.   Surgical History:   Past Surgical History:  Procedure Laterality Date   BILATERAL SALPINGOOPHORECTOMY  2008   DILATION AND CURETTAGE OF UTERUS  08/24/2006   ESOPHAGOGASTRODUODENOSCOPY (EGD) WITH PROPOFOL N/A 03/01/2022   Procedure: ESOPHAGOGASTRODUODENOSCOPY (EGD) WITH PROPOFOL;  Surgeon: Lesly Rubenstein, MD;  Location: [facility];  Service: Endoscopy;  Laterality: N/A;   incision tendon sheath for trigger finger Right    ring finger   LAPAROSCOPIC SUPRACERVICAL HYSTERECTOMY  2008   Dr. Ammie Dalton; with BSO   NOSE SURGERY  03/2008   PORTA CATH INSERTION N/A 03/21/2022   Procedure: PORTA CATH INSERTION;  Surgeon: Algernon Huxley, MD;  Location: [place] [facility];  Service: Cardiovascular;  Laterality: N/A;     Social History:   reports that she has never smoked. She has never used smokeless tobacco. She reports that she does not drink alcohol and does not use drugs.   Family History:  Her family  history includes Arrhythmia in her sister; Atrial fibrillation in her father and sister; Heart attack in her brother; Heart disease in her father and sister; Hyperlipidemia in her father and mother; Hypertension in her mother; Melanoma in her father; Stroke in her mother. There is no history of Breast cancer.   Allergies Allergies  Allergen Reactions   Sulfa Antibiotics Hives    Says her tongue swelled a bit   Sulfonamide Derivatives Swelling    Tongue swelling     Home Medications  Prior to Admission medications   Medication Sig Start Date End Date Taking? Authorizing Provider  Calcium Carbonate-Vitamin D 600-200 MG-UNIT TABS Take 1 tablet by mouth daily.     Yes [provider]  cetirizine (ZYRTEC) 10 MG tablet Take 10 mg by mouth daily.   Yes [provider]  Cholecalciferol 25 MCG (1000 UT) tablet Take 1,000 Units by mouth daily.   Yes [provider]  dexamethasone (DECADRON) 4 MG tablet Pt has 3 day chemo regimen and will need to take 2 tablets ( '8mg'$ ) on day 4 for each cycle of chemo. Make sure to eat food in the am and then take the med 03/30/22  Yes Sindy Guadeloupe, MD  diltiazem (CARDIZEM CD) 180 MG 24 hr capsule Take 180 mg by  mouth daily. 02/26/22  Yes [provider]  flecainide (TAMBOCOR) 50 MG tablet Take 1 tablet by mouth 2 (two) times daily. 07/09/20  Yes [provider]  metoprolol succinate (TOPROL-XL) 25 MG 24 hr tablet Take 0.5 tablets (12.5 mg total) by mouth 2 (two) times daily. 04/17/22 05/17/22 Yes Nolberto Hanlon, MD  midodrine (PROAMATINE) 10 MG tablet Take 1 tablet (10 mg total) by mouth 3 (three) times daily with meals. 04/17/22 05/17/22 Yes Amery, Gwynneth Albright, MD  mometasone (NASONEX) 50 MCG/ACT nasal spray Place 2 sprays into the nose daily.   Yes [provider]  OLANZapine (ZYPREXA) 10 MG tablet Take 1 tablet (10 mg total) by mouth at bedtime. 05/02/22  Yes Sindy Guadeloupe, MD  ondansetron (ZOFRAN-ODT) 4 MG disintegrating tablet  Take 1 tablet (4 mg total) by mouth every 8 (eight) hours as needed for nausea or vomiting. 05/02/22  Yes Borders, Kirt Boys, NP  pantoprazole (PROTONIX) 40 MG tablet Take 40 mg by mouth daily.   Yes [provider]  simvastatin (ZOCOR) 20 MG tablet TAKE 1 TABLET BY MOUTH EVERY EVENING. 12/25/17  Yes Gollan, Kathlene November, MD  Zinc Citrate-Phytase (ZYTAZE) 25-500 MG CAPS Take 1 Dose by mouth daily.   Yes [provider]  DHEA 25 MG tablet Take 25 mg by mouth daily. Patient not taking: Reported on 05/03/2022    [provider]  diclofenac Sodium (VOLTAREN) 1 % GEL Apply 2 g topically 4 (four) times daily. Patient not taking: Reported on 05/03/2022 12/19/19   [provider]  fluconazole (DIFLUCAN) 150 MG tablet Take 1 tablet (150 mg total) by mouth every 3 (three) days. Patient not taking: Reported on 05/03/2022 04/19/22   Borders, Kirt Boys, NP  levofloxacin (LEVAQUIN) 500 MG tablet Take 1 tablet (500 mg total) by mouth daily. Patient not taking: Reported on 05/03/2022 04/19/22   Borders, Kirt Boys, NP  lidocaine-prilocaine (EMLA) cream Apply to affected area once Patient not taking: Reported on 05/03/2022 03/25/22   Sindy Guadeloupe, MD  LORazepam (ATIVAN) 0.5 MG tablet Take 1 tablet (0.5 mg total) by mouth every 6 (six) hours as needed (Nausea or vomiting). 03/29/22   Sindy Guadeloupe, MD  prochlorperazine (COMPAZINE) 10 MG tablet Take 1 tablet (10 mg total) by mouth every 6 (six) hours as needed (Nausea or vomiting). Patient not taking: Reported on 03/29/2022 03/25/22   Sindy Guadeloupe, MD  Saccharomyces boulardii (PROBIOTIC) 250 MG CAPS Take 1 capsule by mouth daily.    [provider]  vitamin B-12 (CYANOCOBALAMIN) 1000 MCG tablet Take 1,000 mcg by mouth daily. Patient not taking: Reported on 05/03/2022    [provider]     Critical care time: 45 minutes     Darel Hong, AGACNP-BC [facility] epic messenger for cross cover  needs If after hours, please call E-link

## 2022-05-03 NOTE — Progress Notes (Signed)
Called ED to get report. Nurse will call back.

## 2022-05-03 NOTE — Progress Notes (Signed)
Virtual Visit via Telephone Note  I connected with [name] on 05/03/22 at  9:00 AM EDT by telephone and verified that I am speaking with the correct person using two identifiers.  Location: Patient: Home Provider: Home   I discussed the limitations, risks, security and privacy concerns of performing an evaluation and management service by telephone and the availability of in person appointments. I also discussed with the patient that there may be a patient responsible charge related to this service. The patient expressed understanding and agreed to proceed.   History of Present Illness: Ariana Wilson is a 61 y.o. 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  She was diagnosed with cancer in June 2023.   Patient is on treatment with cisplatin etoposide chemotherapy.  Observations/Objective: Patient has had nausea with intermittent vomiting since last chemotherapy.  She presented to the ED on 05/01/2022 with presyncopal symptoms and found to have symptomatic anemia with hemoglobin of 6.1.  She received 2 units of PRBC.  Nausea persisted but patient was started yesterday on olanzapine by Dr. Janese Banks.  Today, patient reports that her nausea is improved and she is tolerating p.o. fluids.  She still feels weak but denies fever or chills or other symptomatic complaints.  She does report that her Apple Watch indicated that she was in A-fib yesterday but now shows a return to regular sinus rhythm.  Assessment and Plan: Nausea -this seems symptomatically improved on olanzapine.  We will bring patient in to clinic today for fluids and recheck labs.  History of A-fib -can recheck EKG as needed.  She had EKG in ED earlier this week which showed sinus rhythm. I have requested the patient schedule follow-up visit with her cardiologist, Dr. Nehemiah Massed.  Follow Up Instructions: Labs/fluids today   I discussed the assessment and treatment plan  with the patient. The patient was provided an opportunity to ask questions and all were answered. The patient agreed with the plan and demonstrated an understanding of the instructions.   The patient was advised to call back or seek an in-person evaluation if the symptoms worsen or if the condition fails to improve as anticipated.  I provided 5 minutes of non-face-to-face time during this encounter.   Irean Hong, NP

## 2022-05-04 ENCOUNTER — Inpatient Hospital Stay: Payer: BC Managed Care – PPO | Admitting: Anesthesiology

## 2022-05-04 ENCOUNTER — Telehealth: Payer: Self-pay | Admitting: *Deleted

## 2022-05-04 ENCOUNTER — Encounter
Admission: EM | Disposition: A | Discharge status: Home / Self Care | Payer: Self-pay | Source: Home / Self Care | Attending: Internal Medicine

## 2022-05-04 DIAGNOSIS — K921 Melena: Secondary | ICD-10-CM | POA: Diagnosis not present

## 2022-05-04 DIAGNOSIS — R571 Hypovolemic shock: Secondary | ICD-10-CM | POA: Diagnosis not present

## 2022-05-04 HISTORY — PX: ESOPHAGOGASTRODUODENOSCOPY (EGD) WITH PROPOFOL: SHX5813

## 2022-05-04 LAB — URINALYSIS, COMPLETE (UACMP) WITH MICROSCOPIC
Bacteria, UA: NONE SEEN
Bilirubin Urine: NEGATIVE
Glucose, UA: NEGATIVE mg/dL
Ketones, ur: NEGATIVE mg/dL
Nitrite: NEGATIVE
Protein, ur: NEGATIVE mg/dL
Specific Gravity, Urine: 1.012 (ref 1.005–1.030)
WBC, UA: >50 WBC/hpf — ABNORMAL HIGH (ref 0–5)
pH: 7 (ref 5.0–8.0)

## 2022-05-04 LAB — BPAM RBC
Blood Product Expiration Date: 202308312359
Blood Product Expiration Date: 202309182359
Blood Product Expiration Date: 202309182359
Blood Product Expiration Date: 202309182359
ISSUE DATE / TIME: 202308221448
ISSUE DATE / TIME: 202308221810
Unit Type and Rh: 5100
Unit Type and Rh: 5100
Unit Type and Rh: 5100
Unit Type and Rh: 9500

## 2022-05-04 LAB — MAGNESIUM: Magnesium: 2.2 mg/dL (ref 1.7–2.4)

## 2022-05-04 LAB — TYPE AND SCREEN
ABO/RH(D): B POS
Antibody Screen: NEGATIVE
Unit division: 0
Unit division: 0
Unit division: 0
Unit division: 0

## 2022-05-04 LAB — HEMOGLOBIN AND HEMATOCRIT, BLOOD
HCT: 29.3 % — ABNORMAL LOW (ref 36.0–46.0)
Hemoglobin: 10 g/dL — ABNORMAL LOW (ref 12.0–15.0)

## 2022-05-04 LAB — CBC
HCT: 28 % — ABNORMAL LOW (ref 36.0–46.0)
Hemoglobin: 9.7 g/dL — ABNORMAL LOW (ref 12.0–15.0)
MCH: 28.5 pg (ref 26.0–34.0)
MCHC: 34.6 g/dL (ref 30.0–36.0)
MCV: 82.4 fL (ref 80.0–100.0)
Platelets: 111 10*3/uL — ABNORMAL LOW (ref 150–400)
RBC: 3.4 MIL/uL — ABNORMAL LOW (ref 3.87–5.11)
RDW: 15.8 % — ABNORMAL HIGH (ref 11.5–15.5)
WBC: 5.4 10*3/uL (ref 4.0–10.5)
nRBC: 0 % (ref 0.0–0.2)

## 2022-05-04 LAB — BASIC METABOLIC PANEL
Anion gap: 5 (ref 5–15)
BUN: 22 mg/dL (ref 8–23)
CO2: 24 mmol/L (ref 22–32)
Calcium: 7.9 mg/dL — ABNORMAL LOW (ref 8.9–10.3)
Chloride: 108 mmol/L (ref 98–111)
Creatinine, Ser: 0.7 mg/dL (ref 0.44–1.00)
GFR, Estimated: >60 mL/min (ref >60)
Glucose, Bld: 172 mg/dL — ABNORMAL HIGH (ref 70–99)
Potassium: 3.8 mmol/L (ref 3.5–5.1)
Sodium: 137 mmol/L (ref 135–145)

## 2022-05-04 LAB — PHOSPHORUS: Phosphorus: 3 mg/dL (ref 2.5–4.6)

## 2022-05-04 LAB — PREPARE RBC (CROSSMATCH)

## 2022-05-04 LAB — PROCALCITONIN: Procalcitonin: 0.1 ng/mL

## 2022-05-04 SURGERY — ESOPHAGOGASTRODUODENOSCOPY (EGD) WITH PROPOFOL
Anesthesia: General

## 2022-05-04 MED ORDER — GLYCOPYRROLATE 0.2 MG/ML IJ SOLN
INTRAMUSCULAR | Status: DC | PRN
  Administered 2022-05-04: .2 mg via INTRAVENOUS

## 2022-05-04 MED ORDER — LIDOCAINE HCL (CARDIAC) PF 100 MG/5ML IV SOSY
PREFILLED_SYRINGE | INTRAVENOUS | Status: DC | PRN
  Administered 2022-05-04: 100 mg via INTRAVENOUS

## 2022-05-04 MED ORDER — SODIUM CHLORIDE 0.9 % IV SOLN: INTRAVENOUS | Status: DC

## 2022-05-04 MED ORDER — PROPOFOL 500 MG/50ML IV EMUL
INTRAVENOUS | Status: DC | PRN
  Administered 2022-05-04: 165 ug/kg/min via INTRAVENOUS

## 2022-05-04 MED ORDER — CHLORHEXIDINE GLUCONATE CLOTH 2 % EX PADS
6.0000 | MEDICATED_PAD | Freq: Every day | CUTANEOUS | Status: DC
  Administered 2022-05-04 – 2022-05-06 (×3): 6 via TOPICAL

## 2022-05-04 MED ORDER — LACTATED RINGERS IV SOLN
INTRAVENOUS | Status: DC
Start: 2022-05-04 — End: 2022-05-05

## 2022-05-04 MED ORDER — PROPOFOL 10 MG/ML IV BOLUS
INTRAVENOUS | Status: DC | PRN
  Administered 2022-05-04: 60 mg via INTRAVENOUS
  Administered 2022-05-04: 30 mg via INTRAVENOUS
  Administered 2022-05-04 (×2): 20 mg via INTRAVENOUS
  Administered 2022-05-04: 30 mg via INTRAVENOUS

## 2022-05-04 NOTE — Consult Note (Signed)
Hematology/Oncology Consult note [facility] [phone] [phone]  Patient Care Team: Rusty Aus, MD as PCP - General (Internal Medicine) Clent Jacks, RN as Oncology Nurse Navigator   Name of the patient: Ariana Wilson  [id number]  [birth year]    Reason for referral-melena hypotension in the setting of known esophageal cancer   Requesting physician: Dr. 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  After cycle 1 of chemotherapy patient was admitted to the hospital as well for neutropenic fever.  Patient noted to have iron deficiency anemia even prior to starting chemotherapy with a hemoglobin which at baseline runs around 7.  She came to the ER when she was found hypotensive at the cancer center and almost passed out.  Patient was seen by GI and underwent EGD today which showed gastric tumor at the GE junction which was not appearing to be bleeding or no stigmata of recent bleeding.  Hemospray was applied.  CT angio abdomen and pelvis also did not show any evidence of active GI bleed.  There was some concern for increase in the size of intra-abdominal adenopathy  ECOG PS- 2  Pain scale- 0   Review of systems- Review of Systems  Constitutional:  Positive for malaise/fatigue. Negative for chills, fever and weight loss.  HENT:  Negative for congestion, ear discharge and nosebleeds.   Eyes:  Negative for blurred vision.  Respiratory:  Negative for cough, hemoptysis, sputum production, shortness of breath and wheezing.   Cardiovascular:  Negative for chest pain, palpitations, orthopnea and claudication.  Gastrointestinal:  Negative for abdominal pain, blood in stool, constipation, diarrhea,  heartburn, melena, nausea and vomiting.  Genitourinary:  Negative for dysuria, flank pain, frequency, hematuria and urgency.  Musculoskeletal:  Negative for back pain, joint pain and myalgias.  Skin:  Negative for rash.  Neurological:  Negative for dizziness, tingling, focal weakness, seizures, weakness and headaches.  Endo/Heme/Allergies:  Does not bruise/bleed easily.  Psychiatric/Behavioral:  Negative for depression and suicidal ideas. The patient does not have insomnia.     Allergies  Allergen Reactions   Sulfa Antibiotics Hives    Says her tongue swelled a bit   Sulfonamide Derivatives Swelling    Tongue swelling    Patient Active Problem List   Diagnosis Date Noted   GI bleeding 05/03/2022   Hypovolemic shock ([place]) 05/03/2022   Hypotension 04/14/2022   Febrile neutropenia ([place]) 04/13/2022   Iron deficiency anemia 03/31/2022   Malignant tumor of lower third of esophagus ([place]) 03/24/2022   Primary malignant neuroendocrine neoplasm of esophagus ([place]) 03/20/2022   Bursitis of hip 07/15/2020   History of 2019 novel coronavirus disease (COVID-19) 06/15/2020   Chronic vaginitis 01/06/2020   Acute cystitis 12/07/2018   Vertigo 12/07/2018   Developmental venous anomaly 12/06/2018   Encounter for anticoagulation discussion and counseling 01/29/2018   Essential hypertension 01/27/2018   B12 deficiency 07/04/2017   Tubular adenoma 07/04/2017   Morbid obesity ([place]) 10/04/2016   Primary osteoarthritis of hand 03/08/2016   Family history of coronary artery disease 02/29/2016   Anemia    Menopause    Hyperlipemia 03/07/2011   ATRIAL FIBRILLATION 02/14/2009   Palpitations 02/14/2009     Past Medical History:  Diagnosis Date   Anemia    Arthritis  Atrial fibrillation ([place])    B12 deficiency    Dysrhythmia    Hyperlipidemia    Hypothyroidism    Menopause    Palpitations    Hx of, 25 years ago   Vertigo      Past Surgical History:  Procedure Laterality Date    BILATERAL SALPINGOOPHORECTOMY  2008   DILATION AND CURETTAGE OF UTERUS  08/24/2006   ESOPHAGOGASTRODUODENOSCOPY (EGD) WITH PROPOFOL N/A 03/01/2022   Procedure: ESOPHAGOGASTRODUODENOSCOPY (EGD) WITH PROPOFOL;  Surgeon: Lesly Rubenstein, MD;  Location: [facility] ENDOSCOPY;  Service: Endoscopy;  Laterality: N/A;   incision tendon sheath for trigger finger Right    ring finger   LAPAROSCOPIC SUPRACERVICAL HYSTERECTOMY  2008   Dr. Ammie Dalton; with BSO   NOSE SURGERY  03/2008   PORTA CATH INSERTION N/A 03/21/2022   Procedure: PORTA CATH INSERTION;  Surgeon: Algernon Huxley, MD;  Location: [place] [facility];  Service: Cardiovascular;  Laterality: N/A;    Social History   Socioeconomic History   Marital status: Married    Spouse name: Not on file   Number of children: Not on file   Years of education: Not on file   Highest education level: Not on file  Occupational History   Occupation: Full time  Tobacco Use   Smoking status: Never   Smokeless tobacco: Never  Vaping Use   Vaping Use: Never used  Substance and Sexual Activity   Alcohol use: Never   Drug use: Never   Sexual activity: Not Currently    Birth control/protection: Surgical    Comment: Hysterectomy  Other Topics Concern   Not on file  Social History Narrative   Married with 2 children   Gets regular exercise   Social Determinants of Health   Financial Resource Strain: Low Risk  (04/20/2022)   Overall Financial Resource Strain (CARDIA)    Difficulty of Paying Living Expenses: Not very hard  Food Insecurity: No Food Insecurity (04/20/2022)   Hunger Vital Sign    Worried About Running Out of Food in the Last Year: Never true    Ran Out of Food in the Last Year: Never true  Transportation Needs: No Transportation Needs (04/20/2022)   PRAPARE - Hydrologist (Medical): No    Lack of Transportation (Non-Medical): No  Physical Activity: Inactive (04/20/2022)   Exercise Vital Sign    Days of Exercise per  Week: 0 days    Minutes of Exercise per Session: 0 min  Stress: No Stress Concern Present (04/20/2022)   [place]    Feeling of Stress : Only a little  Social Connections: Socially Integrated (04/20/2022)   Social Connection and Isolation Panel [NHANES]    Frequency of Communication with Friends and Family: More than three times a week    Frequency of Social Gatherings with Friends and Family: More than three times a week    Attends Religious Services: More than 4 times per year    Active Member of [employer] or Organizations: Yes    Attends Music therapist: More than 4 times per year    Marital Status: Married  Human resources officer Violence: Not on file     Family History  Problem Relation Age of Onset   Hyperlipidemia Mother    Hypertension Mother    Stroke Mother    Heart disease Father    Hyperlipidemia Father    Atrial fibrillation Father    Melanoma Father  Arrhythmia Sister        Atrial fibrillation   Heart disease Sister    Atrial fibrillation Sister    Heart attack Brother    Breast cancer Neg Hx      Current Facility-Administered Medications:    Chlorhexidine Gluconate Cloth 2 % PADS 6 each, 6 each, Topical, Daily, Kasa, Kurian, MD   docusate sodium (COLACE) capsule 100 mg, 100 mg, Oral, BID PRN, Flora Lipps, MD   lactated ringers infusion, , Intravenous, Continuous, Teressa Lower, NP, Last Rate: 75 mL/hr at 05/04/22 1020, Restarted at 05/04/22 1020   norepinephrine (LEVOPHED) '4mg'$  in 263m (0.016 mg/mL) premix infusion, 0-40 mcg/min, Intravenous, Titrated, WLucillie Garfinkel MD, Last Rate: 15 mL/hr at 05/04/22 1235, 4 mcg/min at 05/04/22 1235   [COMPLETED] octreotide (SANDOSTATIN) 2 mcg/mL load via infusion 50 mcg, 50 mcg, Intravenous, Once, 50 mcg at 05/03/22 1715 **AND** octreotide (SANDOSTATIN) 500 mcg in sodium chloride 0.9 % 250 mL (2 mcg/mL) infusion, 50 mcg/hr, Intravenous, Continuous, WLucillie Garfinkel MD, Last Rate: 25 mL/hr at 05/04/22 1234, 50 mcg/hr at 05/04/22 1234   ondansetron (ZOFRAN) injection 4 mg, 4 mg, Intravenous, Once, WLucillie Garfinkel MD   pantoprazole (PROTONIX) injection 40 mg, 40 mg, Intravenous, Q12H, WLucillie Garfinkel MD, 40 mg at 05/04/22 0825   piperacillin-tazobactam (ZOSYN) IVPB 3.375 g, 3.375 g, Intravenous, Q8H, CBenita Gutter RPH, Stopped at 05/04/22 1232   polyethylene glycol (MIRALAX / GLYCOLAX) packet 17 g, 17 g, Oral, Daily PRN, KFlora Lipps MD   Physical exam:  Vitals:   05/04/22 1358 05/04/22 1400 05/04/22 1500 05/04/22 1600  BP: 130/61 130/61 (!) 92/48 (!) 104/46  Pulse:  75 66 65  Resp:  '11 10 17  '$ Temp:      TempSrc:      SpO2:  97% 97% 98%  Weight:      Height:       Physical Exam Constitutional:      Comments: Appears pale and fatigued  Cardiovascular:     Rate and Rhythm: Normal rate and regular rhythm.     Heart sounds: Normal heart sounds.  Pulmonary:     Effort: Pulmonary effort is normal.     Breath sounds: Normal breath sounds.  Abdominal:     General: Bowel sounds are normal.     Palpations: Abdomen is soft.  Musculoskeletal:     Cervical back: Normal range of motion.  Skin:    General: Skin is warm and dry.  Neurological:     Mental Status: She is alert and oriented to person, place, and time.           Latest Ref Rng & Units 05/04/2022    1:52 AM  CMP  Glucose 70 - 99 mg/dL 172   BUN 8 - 23 mg/dL 22   Creatinine 0.44 - 1.00 mg/dL 0.70   Sodium 135 - 145 mmol/L 137   Potassium 3.5 - 5.1 mmol/L 3.8   Chloride 98 - 111 mmol/L 108   CO2 22 - 32 mmol/L 24   Calcium 8.9 - 10.3 mg/dL 7.9       Latest Ref Rng & Units 05/04/2022   10:15 AM  CBC  Hemoglobin 12.0 - 15.0 g/dL 10.0   Hematocrit 36.0 - 46.0 % 29.3     '@IMAGES'$ @  CT Angio Abd/Pel W and/or Wo Contrast  Result Date: 05/03/2022 CLINICAL DATA:  Esophageal carcinoma, lymphadenopathy, GI bleed, melena EXAM: CTA ABDOMEN AND PELVIS WITHOUT AND WITH CONTRAST  TECHNIQUE: Multidetector CT imaging of  the abdomen and pelvis was performed using the standard protocol during bolus administration of intravenous contrast. Multiplanar reconstructed images and MIPs were obtained and reviewed to evaluate the vascular anatomy. RADIATION DOSE REDUCTION: This exam was performed according to the departmental dose-optimization program which includes automated exposure control, adjustment of the mA and/or kV according to patient size and/or use of iterative reconstruction technique. CONTRAST:  45m OMNIPAQUE IOHEXOL 350 MG/ML SOLN COMPARISON:  PET-CT 03/22/2022 and previous FINDINGS: VASCULAR Aorta: Mild calcified atheromatous plaque. No aneurysm, dissection, or stenosis. Celiac: Patent without evidence of aneurysm, dissection, vasculitis or significant stenosis. SMA: Patent without evidence of aneurysm, dissection, vasculitis or significant stenosis. Renals: Single left, widely patent. Duplicated right, superior dominant, both patent. IMA: Patent without evidence of aneurysm, dissection, vasculitis or significant stenosis. Inflow: Minimal calcified plaque. No aneurysm, dissection, or stenosis. Proximal Outflow: Bilateral common femoral and visualized portions of the superficial and profunda femoral arteries are patent without evidence of aneurysm, dissection, vasculitis or significant stenosis. Veins: Patent hepatic veins, portal vein, SM V, splenic vein, bilateral renal veins. Incomplete opacification of the iliac venous system and IVC, which appear grossly unremarkable. No venous pathology is evident. Review of the MIP images confirms the above findings. NON-VASCULAR Lower chest: Catheter tip in the right atrium. No pleural or pericardial effusion. Visualized lung bases clear. Hepatobiliary: No focal liver abnormality is seen. No gallstones, gallbladder wall thickening, or biliary dilatation. Pancreas: Unremarkable. No pancreatic ductal dilatation or surrounding inflammatory changes.  Spleen: Normal in size without focal abnormality. Adrenals/Urinary Tract: No adrenal mass. Symmetric renal enhancement without focal lesion or hydronephrosis. Urinary bladder is physiologically distended. Stomach/Bowel: Eccentric soft tissue mass at the GE junction with intraluminal extension. The stomach is incompletely distended. Small bowel decompressed. Scattered oral contrast material in the stomach and small bowel limiting sensitivity for acute GI bleed. The colon is incompletely distended, without evidence of active bleed. Lymphatic: 2.9 cm left para-aortic mass, previously 2.5 cm. 1.5 cm enlarged aortocaval lymph node. Enhancing periportal and gastrohepatic ligament adenopathy as before. Reproductive: Status post hysterectomy. No adnexal masses. Other: Bilateral pelvic phleboliths.  No ascites.  No free air. Musculoskeletal: No acute or significant osseous findings. IMPRESSION: 1. No evidence of active GI bleed, sensitivity somewhat decreased by residual oral contrast material in the stomach and small bowel. 2. Some interval progression of mesenteric and retroperitoneal adenopathy since PET-CT 03/22/2022. 3. Mass at the GE junction as before. Electronically Signed   By: DLucrezia EuropeM.D.   On: 05/03/2022 15:43   DG Chest 2 View  Result Date: 05/03/2022 CLINICAL DATA:  Weakness, hypotension EXAM: CHEST - 2 VIEW COMPARISON:  05/01/2022 FINDINGS: Right IJ approach chest port remains in place. The heart size and mediastinal contours are within normal limits. Both lungs are clear. The visualized skeletal structures are unremarkable. IMPRESSION: No active cardiopulmonary disease. Electronically Signed   By: NDavina PokeD.O.   On: 05/03/2022 12:49   DG Chest Portable 1 View  Result Date: 05/01/2022 CLINICAL DATA:  Hypotension, weakness, history of esophageal carcinoma EXAM: PORTABLE CHEST 1 VIEW COMPARISON:  04/13/2022 FINDINGS: The heart size and mediastinal contours are within normal limits. Both lungs  are clear. The visualized skeletal structures are unremarkable. Tip of right IJ chest port is seen in superior vena cava. IMPRESSION: No active disease. Electronically Signed   By: PElmer PickerM.D.   On: 05/01/2022 20:24   CT Angio Chest Pulmonary Embolism (PE) W or WO Contrast  Result Date: 04/13/2022 CLINICAL DATA:  Pulmonary embolism (  PE) suspected, high prob rales on exam bl. Neutropenic fever. History of esophageal cancer, active chemotherapy. EXAM: CT ANGIOGRAPHY CHEST WITH CONTRAST TECHNIQUE: Multidetector CT imaging of the chest was performed using the standard protocol during bolus administration of intravenous contrast. Multiplanar CT image reconstructions and MIPs were obtained to evaluate the vascular anatomy. RADIATION DOSE REDUCTION: This exam was performed according to the departmental dose-optimization program which includes automated exposure control, adjustment of the mA and/or kV according to patient size and/or use of iterative reconstruction technique. CONTRAST:  31m OMNIPAQUE IOHEXOL 350 MG/ML SOLN COMPARISON:  Chest radiograph earlier today. CT 03/17/2022. PET CT 03/22/2022 FINDINGS: Cardiovascular: There are no filling defects within the pulmonary arteries to suggest pulmonary embolus. Mild aortic atherosclerosis, no aneurysm. There are coronary artery calcifications. Heart is normal in size. No pericardial effusion. Mediastinum/Nodes: There is wall thickening of the distal esophagus extending to the gastroesophageal junction consistent with known malignancy. Mild adjacent paraesophageal fat stranding. No paraesophageal collection or pneumomediastinum. Upper esophagus is minimally patulous. The paraesophageal lymph node on prior exam is difficult to delineate from the esophageal wall thickening currently. Left upper paratracheal no new the thoracic inlet measures 14 mm, series 5, image 28, this previously measured 10 mm. 14 mm left upper paratracheal node series 5, image 52,  previously 8 mm. There is a 13 mm right supraclavicular node, series 5, image 46, previously 11 mm. There are increased number of right upper paratracheal nodes from prior exam, that are subcentimeter. Lungs/Pleura: Mild breathing motion artifact limits detailed parenchymal assessment. Scattered areas of bandlike atelectasis. Heterogeneous pulmonary parenchyma. No confluent consolidation. No visible pulmonary nodule. No pleural effusion. Upper Abdomen: No acute findings. Musculoskeletal: There are no acute or suspicious osseous abnormalities. No chest wall soft tissue abnormalities. Review of the MIP images confirms the above findings. IMPRESSION: 1. No pulmonary embolus. 2. Heterogeneous pulmonary parenchyma may represent small airways disease. Scattered areas of bandlike atelectasis. No evidence of pneumonia. 3. Distal esophageal wall thickening extending to the gastroesophageal junction consistent with known malignancy. There is mild adjacent paraesophageal fat stranding. 4. Mediastinal adenopathy with slight increase size of multiple lymph nodes from prior exam. Attention on subsequent staging exams recommended. Aortic Atherosclerosis (ICD10-I70.0). Electronically Signed   By: MKeith RakeM.D.   On: 04/13/2022 23:25   DG Chest 2 View  Result Date: 04/13/2022 CLINICAL DATA:  Neutropenic fever. EXAM: CHEST - 2 VIEW COMPARISON:  10/30/2013 FINDINGS: Porta catheter on the right with tip at the SVC. Normal heart size and mediastinal contours. There is no edema, consolidation, effusion, or pneumothorax. IMPRESSION: Negative for pneumonia. Electronically Signed   By: JJorje GuildM.D.   On: 04/13/2022 11:00    Assessment and plan- Patient is a 61y.o. 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  She has  required 2 units of blood transfusion so far and her hemoglobin has come up from 6.6-9.8.  There was no active bleeding noted at the time of EGD and Hemospray has been applied.  Anemia multifactorial likely secondary to intermittent bleeding from the tumor as well as chemotherapy.  Mild thrombocytopenia likely secondary to chemotherapy.  White cell count remains normal and she did receive urine needs with second cycle of chemotherapy.  Discussed with the patient that her CT angio abdomen did show concern for possible increasing intra-abdominal adenopathy.  For example left para-aortic mass which was previously 2.5 cm was now 2.9 cm.  I will attempt to get a repeat PET CT scan as an outpatient to compare it with prior PET scan.  If she has not responded to this regimen I will consider switching her to FOLFOX chemotherapy and also get in touch with [facility] medical oncology about this   Patient is otherwise hemodynamically stable and can be hopefully discharged soon once off pressors and IVF   Visit Diagnosis 1. Symptomatic anemia   2. Melena   3. Generalized weakness     Dr. Randa Evens, MD, MPH [facility] [phone] 05/04/2022

## 2022-05-04 NOTE — Op Note (Signed)
[facility] Gastroenterology Patient Name: Ariana Wilson Procedure Date: 05/04/2022 1:15 PM MRN: [id number] Account #: [id number] Date of Birth: 1961-08-28 Admit Type: Outpatient Age: 61 Room: [facility] ENDO ROOM 3 Gender: Female Note Status: Finalized Instrument Name: Upper Endoscope [id number] Procedure:             Upper GI endoscopy Indications:           Melena Providers:             [name] MD, MD Referring MD:          Rusty Aus, MD (Referring MD) Medicines:             Monitored Anesthesia Care Complications:         No immediate complications. Procedure:             Pre-Anesthesia Assessment:                        - Prior to the procedure, a History and Physical was                         performed, and patient medications, allergies and                         sensitivities were reviewed. The patient's tolerance                         of previous anesthesia was reviewed.                        - The risks and benefits of the procedure and the                         sedation options and risks were discussed with the                         patient. All questions were answered and informed                         consent was obtained.                        - ASA Grade Assessment: III - A patient with severe                         systemic disease.                        After obtaining informed consent, the endoscope was                         passed under direct vision. Throughout the procedure,                         the patient's blood pressure, pulse, and oxygen                         saturations were monitored continuously. The Endoscope                         was introduced  through the mouth, and advanced to the                         third part of duodenum. The upper GI endoscopy was                         accomplished with ease. The patient tolerated the                         procedure well. Findings:      The examined duodenum was  normal.      A large, polypoid, partially circumferential (involving one-half of the       lumen circumference) mass with no bleeding and stigmata of recent       bleeding was found at the gastroesophageal junction. For hemostasis,       hemostatic spray was deployed. Multiple sprays were applied. There was       no bleeding at the end of the procedure. Impression:            - Normal examined duodenum.                        - Gastric tumor at the gastroesophageal junction.                         Hemostatic spray applied.                        - No specimens collected. Recommendation:        - Return patient to hospital ward for ongoing care.                        - Clear liquid diet for 2 hours.                        - Continue present medications. Procedure Code(s):     --- Professional ---                        [id number], Esophagogastroduodenoscopy, flexible,                         transoral; with control of bleeding, any method Diagnosis Code(s):     --- Professional ---                        D49.0, Neoplasm of unspecified behavior of digestive                         system                        K92.1, Melena (includes Hematochezia) CPT copyright 2019 American Medical Association. All rights reserved. The codes documented in this report are preliminary and upon coder review may  be revised to meet current compliance requirements. [name], MD [name] MD, MD 05/04/2022 1:35:15 PM This report has been signed electronically. Number of Addenda: 0 Note Initiated On: 05/04/2022 1:15 PM Estimated Blood Loss:  Estimated blood loss: none.      [facility]

## 2022-05-04 NOTE — Anesthesia Postprocedure Evaluation (Signed)
Anesthesia Post Note  Patient: Ariana Wilson  Procedure(s) Performed: ESOPHAGOGASTRODUODENOSCOPY (EGD) WITH PROPOFOL  Patient location during evaluation: Endoscopy Anesthesia Type: General Level of consciousness: awake and alert Pain management: pain level controlled Vital Signs Assessment: post-procedure vital signs reviewed and stable Respiratory status: spontaneous breathing, nonlabored ventilation, respiratory function stable and patient connected to nasal cannula oxygen Cardiovascular status: blood pressure returned to baseline and stable Postop Assessment: no apparent nausea or vomiting Anesthetic complications: no   No notable events documented.   Last Vitals:  Vitals:   05/04/22 1358 05/04/22 1400  BP: 130/61 130/61  Pulse:  75  Resp:  11  Temp:    SpO2:  97%    Last Pain:  Vitals:   05/04/22 1400  TempSrc:   PainSc: 0-No pain                 Arita Miss

## 2022-05-04 NOTE — Progress Notes (Addendum)
NAME:  Ariana Wilson, MRN:  [id number], DOB:  22-Aug-1961, LOS: 1 ADMISSION DATE:  05/03/2022, CONSULTATION DATE:  05/03/2022 REFERRING MD:  Dr. Jacelyn Grip , CHIEF COMPLAINT:  Generalized weakness, N/V, Hypotension   Brief Pt Description / Synopsis:  61 y.o. female admitted with Hypovolemic/Hemorrhagic shock due to suspected Acute GI Bleed from GE junction neuroendocrine tumor along with dehydration from nausea and vomiting.  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  She was seen in the emergency department 2 days ago and found to be anemic with hemoglobin of 6.1, felt to be attributed to her chemotherapy which she was given 2 units of packed red blood cells.  Since discharge from the ED she has felt progressively weaker, fatigued, along with nausea and vomiting (non-bloody) which she attributed to chemotherapy.  She denied chest pain, shortness of breath, cough, fever, chills, abdominal pain, hematemesis, hematochezia,  melena, dysuria, or diarrhea.  Of note, she was recently admitted at [facility] from 04/13/22 through 04/17/22 for Neutropenic fever (urine cx with staph haemolyticus), hypotension and symptomatic anemia requiring 1 unit pRBC's, and paroxsymal atrial fibrillation. She was discharged home on 7 day course of Levaquin.  ED Course: Initial Vital Signs: Temperature 98.2 F orally, pulse 77, respiratory rate 13, blood pressure 89/57, SPO2 98% Significant Labs: Sodium 132, potassium 3.1, glucose 121, BUN 36, creatinine 0.62, lactic acid 1.3, WBC 6.8, hemoglobin 6.6, hematocrit 19.1, MCV 86.4, MCHC 34.6, platelets 139, smear review with normal platelets and mild left shift Urinalysis negative for UTI Imaging Chest  X-ray>>IMPRESSION: No active cardiopulmonary disease. CT angio of abdomen and pelvis>>IMPRESSION: 1. No evidence of active GI bleed, sensitivity somewhat decreased by residual oral contrast material in the stomach and small bowel. 2. Some interval progression of mesenteric and retroperitoneal adenopathy since PET-CT 03/22/2022. 3. Mass at the GE junction as before. Medications Administered: 2 L normal saline bolus, 40 mg IV Protonix, octreotide, starting 1 unit PRBCs  Follow up labs showed that she remains persistently anemic despite having received 2 unit pRBC's, suspicion for occult GI bleeding.  ED provider performed rectal exam, which revealed dark stools which were guaiac positive.  Despite receiving IV fluids in the ED, she remained hypotensive of which Levophed was started in the ED.  PCCM is asked to admit the patient to ICU for further work-up and treatment.  Gastroenterology and oncology have been consulted.  Please see "significant events section" below for full detailed hospital course.    Pertinent  Medical History   Past Medical History:  Diagnosis Date   Anemia    Arthritis    Atrial fibrillation ([place])    B12 deficiency    Dysrhythmia    Hyperlipidemia    Hypothyroidism    Menopause    Palpitations    Hx of, 25 years ago   Vertigo     Micro Data:  N/A  Antimicrobials:  Zosyn 8/22>>  Significant Hospital Events: Including procedures, antibiotic start and stop dates in addition to other pertinent events   8/22: PCCM asked to admit due to need for vasopressors. GI and Oncology consulted. 8/23: No acute events overnight; Hgb stable plans for EGD today, remains on levophed gtt '@2mcg'$ /min to maintain map >65   Interim History / Subjective:  As outlined above  Objective   Blood pressure (!) 113/48, pulse 73, temperature 98.1 F (36.7 C), temperature source Oral, resp. rate 16, height '5\' 1"'$  (1.549 m), weight 70.8 kg, SpO2 98 %.        Intake/Output  Summary (Last 24 hours) at 05/04/2022 5465 Last data filed at 05/04/2022 0600 Gross per 24 hour  Intake 1303.2 ml  Output 700 ml  Net 603.2 ml   Filed Weights   05/03/22 1745  Weight: 70.8 kg    Examination: General: Acute on chronically ill-appearing female, NAD resting in bed  HENT: Atraumatic, normocephalic, neck supple, no JVD Lungs: Clear throughout, even, non labored  Cardiovascular: NSR, rrr, no r/g, 2+ radial/2+ distal pulses, no edema  Abdomen: +BS x4, soft, non tender, non distended  Extremities: Normal bulk and tone, no deformities, moves all extremities  Neuro: Awake and alert, oriented x4, following commands, PERRLA  GU: Voiding   Resolved Hospital Problem list   Mild hypokalemia   Assessment & Plan:   Hypovolemic/Hemorrhagic Shock due to suspected Acute GI Bleed & dehydration with N/V PMHx: Atrial fibrillation on flecainide (not on AC) - Continuous cardiac monitoring - Will start maintenance iv fluids - Prn levophed gtt to maintain map >65   Acute Blood Loss Anemia superimposed on Chronic Anemia Suspected acute GI bleed in setting of GE junction Neuroendocrine tumor CT Angiogram Abdomen is negative for active bleeding - Monitor for S/Sx of bleeding - Trend CBC (H&H q6h) - SCD's for VTE Prophylaxis (chemical prophylaxis contraindicated) - Transfuse for Hgb <8 - Protonix and Octreotide gtt  - GI consulted, appreciate input ~ Plan for EGD on 8/23 - Oncology consulted, appreciate input  Mild Hyponatremia, suspect Hypotonic Hypovolemic in setting of N/V - Trend BMP  - Replace electrolytes as indicated  - Monitor UOP - Will start iv maintenance fluids   Recent History of Neutropenic fever due to Staph Haemolyticus UTI PMHx: immunosuppression due to chemo - Trend WBC and monitor fever curve  - Trend PCT  - UA pending  - Continue zosyn for now due to possible intraabdominal infection   Best Practice (right click and "Reselect all SmartList Selections"  daily)   Diet/type: NPO DVT prophylaxis: SCD GI prophylaxis: PPI Lines: N/A Foley:  N/A Code Status:  full code Last date of multidisciplinary goals of care discussion [05/04/22]  Pt and her husband updated at bedside 04/2322  Labs   CBC: Recent Labs  Lab 05/01/22 1940 05/03/22 1128 05/03/22 2124 05/04/22 0152  WBC 39.0* 6.8  --  5.4  NEUTROABS 31.3* 5.2  --   --   HGB 6.1* 6.6* 9.8* 9.7*  HCT 18.5* 19.1* 29.3* 28.0*  MCV 92.0 86.4  --  82.4  PLT 321 139*  --  111*    Basic Metabolic Panel: Recent Labs  Lab 05/01/22 1940 05/03/22 1128 05/04/22 0152  NA 135 132* 137  K 3.4* 3.1* 3.8  CL 102 101 108  CO2 '23 24 24  '$ GLUCOSE 101* 121* 172*  BUN 30* 36* 22  CREATININE 0.68 0.62 0.70  CALCIUM 8.2* 7.6* 7.9*  MG 2.1  --  2.2  PHOS  --   --  3.0   GFR: Estimated Creatinine Clearance: 66.5 mL/min (by C-G formula based on SCr of 0.7 mg/dL). Recent Labs  Lab 05/01/22 1940 05/03/22 1128 05/03/22 2124 05/04/22 0152  PROCALCITON  --   --  0.13 0.10  WBC 39.0* 6.8  --  5.4  LATICACIDVEN  --  1.3 1.0  --  Liver Function Tests: Recent Labs  Lab 05/01/22 1940 05/03/22 1128  AST 18 15  ALT 11 10  ALKPHOS 58 53  BILITOT 0.9 0.9  PROT 5.6* 5.1*  ALBUMIN 3.1* 2.8*   Recent Labs  Lab 05/01/22 1940  LIPASE 34   No results for input(s): "AMMONIA" in the last 168 hours.  ABG No results found for: "PHART", "PCO2ART", "PO2ART", "HCO3", "TCO2", "ACIDBASEDEF", "O2SAT"   Coagulation Profile: Recent Labs  Lab 05/01/22 2044  INR 1.1    Cardiac Enzymes: No results for input(s): "CKTOTAL", "CKMB", "CKMBINDEX", "TROPONINI" in the last 168 hours.  HbA1C: No results found for: "HGBA1C"  CBG: Recent Labs  Lab 05/03/22 1752  GLUCAP 85    Review of Systems: Positives in BOLD   Gen: Denies fever, chills, weight change, fatigue, night sweats HEENT: Denies blurred vision, double vision, hearing loss, tinnitus, sinus congestion, rhinorrhea, sore throat, neck  stiffness, dysphagia PULM: Denies shortness of breath, cough, sputum production, hemoptysis, wheezing CV: Denies chest pain, edema, orthopnea, paroxysmal nocturnal dyspnea, palpitations GI: Denies abdominal pain, nausea, vomiting, diarrhea, hematochezia, melena, constipation, change in bowel habits GU: Denies dysuria, hematuria, polyuria, oliguria, urethral discharge Endocrine: Denies hot or cold intolerance, polyuria, polyphagia or appetite change Derm: Denies rash, dry skin, scaling or peeling skin change Heme: Denies easy bruising, bleeding, bleeding gums Neuro: Denies headache, numbness, weakness, slurred speech, loss of memory or consciousness  Past Medical History:  She,  has a past medical history of Anemia, Arthritis, Atrial fibrillation (HCC), B12 deficiency, Dysrhythmia, Hyperlipidemia, Hypothyroidism, Menopause, Palpitations, and Vertigo.   Surgical History:   Past Surgical History:  Procedure Laterality Date   BILATERAL SALPINGOOPHORECTOMY  2008   DILATION AND CURETTAGE OF UTERUS  08/24/2006   ESOPHAGOGASTRODUODENOSCOPY (EGD) WITH PROPOFOL N/A 03/01/2022   Procedure: ESOPHAGOGASTRODUODENOSCOPY (EGD) WITH PROPOFOL;  Surgeon: Lesly Rubenstein, MD;  Location: [facility] ENDOSCOPY;  Service: Endoscopy;  Laterality: N/A;   incision tendon sheath for trigger finger Right    ring finger   LAPAROSCOPIC SUPRACERVICAL HYSTERECTOMY  2008   Dr. Ammie Dalton; with BSO   NOSE SURGERY  03/2008   PORTA CATH INSERTION N/A 03/21/2022   Procedure: PORTA CATH INSERTION;  Surgeon: Algernon Huxley, MD;  Location: [place] [facility];  Service: Cardiovascular;  Laterality: N/A;     Social History:   reports that she has never smoked. She has never used smokeless tobacco. She reports that she does not drink alcohol and does not use drugs.   Family History:  Her family history includes Arrhythmia in her sister; Atrial fibrillation in her father and sister; Heart attack in her brother; Heart disease in her  father and sister; Hyperlipidemia in her father and mother; Hypertension in her mother; Melanoma in her father; Stroke in her mother. There is no history of Breast cancer.   Allergies Allergies  Allergen Reactions   Sulfa Antibiotics Hives    Says her tongue swelled a bit   Sulfonamide Derivatives Swelling    Tongue swelling     Home Medications  Prior to Admission medications   Medication Sig Start Date End Date Taking? Authorizing Provider  Calcium Carbonate-Vitamin D 600-200 MG-UNIT TABS Take 1 tablet by mouth daily.     Yes [provider]  cetirizine (ZYRTEC) 10 MG tablet Take 10 mg by mouth daily.   Yes [provider]  Cholecalciferol 25 MCG (1000 UT) tablet Take 1,000 Units by mouth daily.   Yes [provider]  dexamethasone (DECADRON) 4 MG tablet Pt  has 3 day chemo regimen and will need to take 2 tablets ( '8mg'$ ) on day 4 for each cycle of chemo. Make sure to eat food in the am and then take the med 03/30/22  Yes Sindy Guadeloupe, MD  diltiazem (CARDIZEM CD) 180 MG 24 hr capsule Take 180 mg by mouth daily. 02/26/22  Yes [provider]  flecainide (TAMBOCOR) 50 MG tablet Take 1 tablet by mouth 2 (two) times daily. 07/09/20  Yes [provider]  metoprolol succinate (TOPROL-XL) 25 MG 24 hr tablet Take 0.5 tablets (12.5 mg total) by mouth 2 (two) times daily. 04/17/22 05/17/22 Yes Nolberto Hanlon, MD  midodrine (PROAMATINE) 10 MG tablet Take 1 tablet (10 mg total) by mouth 3 (three) times daily with meals. 04/17/22 05/17/22 Yes Amery, Gwynneth Albright, MD  mometasone (NASONEX) 50 MCG/ACT nasal spray Place 2 sprays into the nose daily.   Yes [provider]  OLANZapine (ZYPREXA) 10 MG tablet Take 1 tablet (10 mg total) by mouth at bedtime. 05/02/22  Yes Sindy Guadeloupe, MD  ondansetron (ZOFRAN-ODT) 4 MG disintegrating tablet Take 1 tablet (4 mg total) by mouth every 8 (eight) hours as needed for nausea or vomiting. 05/02/22  Yes Borders, Kirt Boys, NP   pantoprazole (PROTONIX) 40 MG tablet Take 40 mg by mouth daily.   Yes [provider]  simvastatin (ZOCOR) 20 MG tablet TAKE 1 TABLET BY MOUTH EVERY EVENING. 12/25/17  Yes Gollan, Kathlene November, MD  Zinc Citrate-Phytase (ZYTAZE) 25-500 MG CAPS Take 1 Dose by mouth daily.   Yes [provider]  DHEA 25 MG tablet Take 25 mg by mouth daily. Patient not taking: Reported on 05/03/2022    [provider]  diclofenac Sodium (VOLTAREN) 1 % GEL Apply 2 g topically 4 (four) times daily. Patient not taking: Reported on 05/03/2022 12/19/19   [provider]  fluconazole (DIFLUCAN) 150 MG tablet Take 1 tablet (150 mg total) by mouth every 3 (three) days. Patient not taking: Reported on 05/03/2022 04/19/22   Borders, Kirt Boys, NP  levofloxacin (LEVAQUIN) 500 MG tablet Take 1 tablet (500 mg total) by mouth daily. Patient not taking: Reported on 05/03/2022 04/19/22   Borders, Kirt Boys, NP  lidocaine-prilocaine (EMLA) cream Apply to affected area once Patient not taking: Reported on 05/03/2022 03/25/22   Sindy Guadeloupe, MD  LORazepam (ATIVAN) 0.5 MG tablet Take 1 tablet (0.5 mg total) by mouth every 6 (six) hours as needed (Nausea or vomiting). 03/29/22   Sindy Guadeloupe, MD  prochlorperazine (COMPAZINE) 10 MG tablet Take 1 tablet (10 mg total) by mouth every 6 (six) hours as needed (Nausea or vomiting). Patient not taking: Reported on 03/29/2022 03/25/22   Sindy Guadeloupe, MD  Saccharomyces boulardii (PROBIOTIC) 250 MG CAPS Take 1 capsule by mouth daily.    [provider]  vitamin B-12 (CYANOCOBALAMIN) 1000 MCG tablet Take 1,000 mcg by mouth daily. Patient not taking: Reported on 05/03/2022    [provider]     Critical care time: 30 minutes    Donell Beers, [place] Pager [phone] (please enter 7 digits) PCCM Consult Pager [phone] (please enter 7 digits)

## 2022-05-04 NOTE — Transfer of Care (Signed)
Immediate Anesthesia Transfer of Care Note  Patient: Ariana Wilson  Procedure(s) Performed: ESOPHAGOGASTRODUODENOSCOPY (EGD) WITH PROPOFOL  Patient Location: Endoscopy Unit  Anesthesia Type:General  Level of Consciousness: drowsy and patient cooperative  Airway & Oxygen Therapy: Patient Spontanous Breathing and Patient connected to face mask oxygen  Post-op Assessment: Report given to RN and Post -op Vital signs reviewed and stable  Post vital signs: Reviewed and stable  Last Vitals:  Vitals Value Taken Time  BP 114/48 05/04/22 1338  Temp    Pulse 77 05/04/22 1338  Resp 24 05/04/22 1338  SpO2 100 % 05/04/22 1338  Vitals shown include unvalidated device data.  Last Pain:  Vitals:   05/04/22 1200  TempSrc:   PainSc: 0-No pain         Complications: No notable events documented.

## 2022-05-04 NOTE — Consult Note (Signed)
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 Patient ordered blood transfusion for anemia. Requiring vasopressors. Pharmacy consulted to assist with electrolyte monitoring and replacement as indicated.  Goal of Therapy:  Electrolytes within normal limits  Plan:  --no electrolyte replacement warranted for today --Follow-up electrolytes with AM labs tomorrow  Dallie Piles 05/04/2022 7:12 AM

## 2022-05-04 NOTE — Consult Note (Signed)
[name] , MD [address] [place], [place], [place], [address] [id number], [place], [place], [place], [address] Phone: [phone]  Fax: [phone]  Consultation  Referring Provider:    Dr. Mortimer Fries Primary Care Physician:  Rusty Aus, MD Primary Gastroenterologist:  Dr. Haig Prophet         Reason for Consultation:     GI bleed  Date of Admission:  05/03/2022 Date of Consultation:  05/04/2022         HPI:   Ariana Wilson is a 61 y.o. 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  Mass was partially obstructing.  On chemotherapy.  To start radiation therapy.  She has had some anemia secondary to chemotherapy.  Presented to the ER 3 days back found to be anemic and received 2 units of PRBCs subsequently felt more fatigued and came into the ER was found to be hypotensive and have been of 6.1 g down from 7.9 g 6 days prior.  Received a blood transfusion and this morning is 9.7 g.  In the ER stool was black in color.  Concern for bleeding from the mass.  I have been consulted to evaluate the same.  She denies any hematemesis and no bowel movements today . No abdominal pain or any other GI complaints.   Past Medical History:  Diagnosis Date   Anemia    Arthritis    Atrial fibrillation ([facility])    B12 deficiency    Dysrhythmia    Hyperlipidemia    Hypothyroidism    Menopause    Palpitations    Hx of, 25 years ago   Vertigo     Past Surgical History:  Procedure Laterality Date   BILATERAL SALPINGOOPHORECTOMY  2008   DILATION AND CURETTAGE OF UTERUS  08/24/2006   ESOPHAGOGASTRODUODENOSCOPY (EGD) WITH PROPOFOL N/A 03/01/2022   Procedure: ESOPHAGOGASTRODUODENOSCOPY (EGD) WITH PROPOFOL;  Surgeon: Lesly Rubenstein, MD;  Location: [facility];  Service: Endoscopy;  Laterality: N/A;   incision tendon  sheath for trigger finger Right    ring finger   LAPAROSCOPIC SUPRACERVICAL HYSTERECTOMY  2008   Dr. Ammie Dalton; with BSO   NOSE SURGERY  03/2008   PORTA CATH INSERTION N/A 03/21/2022   Procedure: PORTA CATH INSERTION;  Surgeon: Algernon Huxley, MD;  Location: [facility];  Service: Cardiovascular;  Laterality: N/A;    Prior to Admission medications   Medication Sig Start Date End Date Taking? Authorizing Provider  Calcium Carbonate-Vitamin D 600-200 MG-UNIT TABS Take 1 tablet by mouth daily.     Yes [provider]  cetirizine (ZYRTEC) 10 MG tablet Take 10 mg by mouth daily.   Yes [provider]  Cholecalciferol 25 MCG (1000 UT) tablet Take 1,000 Units by mouth daily.   Yes [provider]  dexamethasone (DECADRON) 4 MG tablet Pt has 3 day chemo regimen and will need to take 2 tablets ( '8mg'$ ) on day 4 for each cycle of chemo. Make sure to eat food in the am and then take the med 03/30/22  Yes Sindy Guadeloupe, MD  diltiazem (CARDIZEM CD) 180 MG 24 hr capsule Take 180 mg by mouth daily. 02/26/22  Yes [provider]  flecainide (TAMBOCOR) 50 MG tablet Take 1 tablet by mouth 2 (two) times daily. 07/09/20  Yes [provider]  metoprolol succinate (TOPROL-XL) 25 MG 24 hr tablet Take 0.5 tablets (12.5 mg total) by mouth 2 (two) times daily. 04/17/22 05/17/22 Yes Nolberto Hanlon, MD  midodrine (PROAMATINE) 10 MG tablet Take 1 tablet (10 mg total) by mouth 3 (three) times daily with meals. 04/17/22 05/17/22 Yes Amery, Gwynneth Albright, MD  mometasone (NASONEX) 50 MCG/ACT nasal spray Place 2 sprays into the nose daily.   Yes [provider]  OLANZapine (ZYPREXA) 10 MG tablet Take 1 tablet (10 mg total) by mouth at bedtime. 05/02/22  Yes Sindy Guadeloupe, MD  ondansetron (ZOFRAN-ODT) 4 MG disintegrating tablet Take 1 tablet (4 mg total) by mouth every 8 (eight) hours as needed for nausea or vomiting. 05/02/22  Yes Borders, Kirt Boys, NP  pantoprazole (PROTONIX) 40 MG tablet  Take 40 mg by mouth daily.   Yes [provider]  simvastatin (ZOCOR) 20 MG tablet TAKE 1 TABLET BY MOUTH EVERY EVENING. 12/25/17  Yes Gollan, Kathlene November, MD  Zinc Citrate-Phytase (ZYTAZE) 25-500 MG CAPS Take 1 Dose by mouth daily.   Yes [provider]  DHEA 25 MG tablet Take 25 mg by mouth daily. Patient not taking: Reported on 05/03/2022    [provider]  diclofenac Sodium (VOLTAREN) 1 % GEL Apply 2 g topically 4 (four) times daily. Patient not taking: Reported on 05/03/2022 12/19/19   [provider]  fluconazole (DIFLUCAN) 150 MG tablet Take 1 tablet (150 mg total) by mouth every 3 (three) days. Patient not taking: Reported on 05/03/2022 04/19/22   Borders, Kirt Boys, NP  levofloxacin (LEVAQUIN) 500 MG tablet Take 1 tablet (500 mg total) by mouth daily. Patient not taking: Reported on 05/03/2022 04/19/22   Borders, Kirt Boys, NP  lidocaine-prilocaine (EMLA) cream Apply to affected area once Patient not taking: Reported on 05/03/2022 03/25/22   Sindy Guadeloupe, MD  LORazepam (ATIVAN) 0.5 MG tablet Take 1 tablet (0.5 mg total) by mouth every 6 (six) hours as needed (Nausea or vomiting). 03/29/22   Sindy Guadeloupe, MD  prochlorperazine (COMPAZINE) 10 MG tablet Take 1 tablet (10 mg total) by mouth every 6 (six) hours as needed (Nausea or vomiting). Patient not taking: Reported on 03/29/2022 03/25/22   Sindy Guadeloupe, MD  Saccharomyces boulardii (PROBIOTIC) 250 MG CAPS Take 1 capsule by mouth daily.    [provider]  vitamin B-12 (CYANOCOBALAMIN) 1000 MCG tablet Take 1,000 mcg by mouth daily. Patient not taking: Reported on 05/03/2022    [provider]    Family History  Problem Relation Age of Onset   Hyperlipidemia Mother    Hypertension Mother    Stroke Mother    Heart disease Father    Hyperlipidemia Father    Atrial fibrillation Father    Melanoma Father    Arrhythmia Sister        Atrial fibrillation   Heart disease Sister    Atrial  fibrillation Sister    Heart attack Brother    Breast cancer Neg Hx      Social History   Tobacco Use   Smoking status: Never   Smokeless tobacco: Never  Vaping Use   Vaping Use: Never used  Substance Use Topics   Alcohol use: Never   Drug use: Never    Allergies as of 05/03/2022 - Review Complete 05/03/2022  Allergen Reaction Noted   Sulfa antibiotics Hives 12/06/2018   Sulfonamide derivatives Swelling 02/18/2010    Review of Systems:    All systems reviewed and negative  except where noted in HPI.   Physical Exam:  Vital signs in last 24 hours: Temp:  [97.7 F (36.5 C)-98.4 F (36.9 C)] 98.1 F (36.7 C) (08/23 0747) Pulse Rate:  [53-84] 71 (08/23 0930) Resp:  [8-26] 17 (08/23 0930) BP: (74-138)/(39-105) 121/60 (08/23 0930) SpO2:  [92 %-100 %] 99 % (08/23 0930) Weight:  [70.8 kg] 70.8 kg (08/22 1745)   General:   Pleasant, cooperative in NAD Head:  Normocephalic and atraumatic. Eyes:   No icterus.   Conjunctiva pink. PERRLA. Ears:  Normal auditory acuity. Neck:  Supple; no masses or thyroidomegaly Lungs: Respirations even and unlabored. Lungs clear to auscultation bilaterally.   No wheezes, crackles, or rhonchi.  Heart:  Regular rate and rhythm;  Without murmur, clicks, rubs or gallops Abdomen:  Soft, nondistended, nontender. Normal bowel sounds. No appreciable masses or hepatomegaly.  No rebound or guarding.  Neurologic:  Alert and oriented x3;  grossly normal neurologically. Psych:  Alert and cooperative. Normal affect.  LAB RESULTS: Recent Labs    05/01/22 1940 05/03/22 1128 05/03/22 2124 05/04/22 0152  WBC 39.0* 6.8  --  5.4  HGB 6.1* 6.6* 9.8* 9.7*  HCT 18.5* 19.1* 29.3* 28.0*  PLT 321 139*  --  111*   BMET Recent Labs    05/01/22 1940 05/03/22 1128 05/04/22 0152  NA 135 132* 137  K 3.4* 3.1* 3.8  CL 102 101 108  CO2 '23 24 24  '$ GLUCOSE 101* 121* 172*  BUN 30* 36* 22  CREATININE 0.68 0.62 0.70  CALCIUM 8.2* 7.6* 7.9*   LFT Recent Labs     05/03/22 1128  PROT 5.1*  ALBUMIN 2.8*  AST 15  ALT 10  ALKPHOS 53  BILITOT 0.9   PT/INR Recent Labs    05/01/22 2044  LABPROT 13.6  INR 1.1    STUDIES: CT Angio Abd/Pel W and/or Wo Contrast  Result Date: 05/03/2022 CLINICAL DATA:  Esophageal carcinoma, lymphadenopathy, GI bleed, melena EXAM: CTA ABDOMEN AND PELVIS WITHOUT AND WITH CONTRAST TECHNIQUE: Multidetector CT imaging of the abdomen and pelvis was performed using the standard protocol during bolus administration of intravenous contrast. Multiplanar reconstructed images and MIPs were obtained and reviewed to evaluate the vascular anatomy. RADIATION DOSE REDUCTION: This exam was performed according to the departmental dose-optimization program which includes automated exposure control, adjustment of the mA and/or kV according to patient size and/or use of iterative reconstruction technique. CONTRAST:  51m OMNIPAQUE IOHEXOL 350 MG/ML SOLN COMPARISON:  PET-CT 03/22/2022 and previous FINDINGS: VASCULAR Aorta: Mild calcified atheromatous plaque. No aneurysm, dissection, or stenosis. Celiac: Patent without evidence of aneurysm, dissection, vasculitis or significant stenosis. SMA: Patent without evidence of aneurysm, dissection, vasculitis or significant stenosis. Renals: Single left, widely patent. Duplicated right, superior dominant, both patent. IMA: Patent without evidence of aneurysm, dissection, vasculitis or significant stenosis. Inflow: Minimal calcified plaque. No aneurysm, dissection, or stenosis. Proximal Outflow: Bilateral common femoral and visualized portions of the superficial and profunda femoral arteries are patent without evidence of aneurysm, dissection, vasculitis or significant stenosis. Veins: Patent hepatic veins, portal vein, SM V, splenic vein, bilateral renal veins. Incomplete opacification of the iliac venous system and IVC, which appear grossly unremarkable. No venous pathology is evident. Review of the MIP images  confirms the above findings. NON-VASCULAR Lower chest: Catheter tip in the right atrium. No pleural or pericardial effusion. Visualized lung bases clear. Hepatobiliary: No focal liver abnormality is seen. No gallstones, gallbladder wall thickening, or biliary dilatation. Pancreas: Unremarkable. No pancreatic ductal dilatation or  surrounding inflammatory changes. Spleen: Normal in size without focal abnormality. Adrenals/Urinary Tract: No adrenal mass. Symmetric renal enhancement without focal lesion or hydronephrosis. Urinary bladder is physiologically distended. Stomach/Bowel: Eccentric soft tissue mass at the GE junction with intraluminal extension. The stomach is incompletely distended. Small bowel decompressed. Scattered oral contrast material in the stomach and small bowel limiting sensitivity for acute GI bleed. The colon is incompletely distended, without evidence of active bleed. Lymphatic: 2.9 cm left para-aortic mass, previously 2.5 cm. 1.5 cm enlarged aortocaval lymph node. Enhancing periportal and gastrohepatic ligament adenopathy as before. Reproductive: Status post hysterectomy. No adnexal masses. Other: Bilateral pelvic phleboliths.  No ascites.  No free air. Musculoskeletal: No acute or significant osseous findings. IMPRESSION: 1. No evidence of active GI bleed, sensitivity somewhat decreased by residual oral contrast material in the stomach and small bowel. 2. Some interval progression of mesenteric and retroperitoneal adenopathy since PET-CT 03/22/2022. 3. Mass at the GE junction as before. Electronically Signed   By: Lucrezia Europe M.D.   On: 05/03/2022 15:43   DG Chest 2 View  Result Date: 05/03/2022 CLINICAL DATA:  Weakness, hypotension EXAM: CHEST - 2 VIEW COMPARISON:  05/01/2022 FINDINGS: Right IJ approach chest port remains in place. The heart size and mediastinal contours are within normal limits. Both lungs are clear. The visualized skeletal structures are unremarkable. IMPRESSION: No  active cardiopulmonary disease. Electronically Signed   By: Davina Poke D.O.   On: 05/03/2022 12:49      Impression / Plan:   Ariana Wilson is a 61 y.o. 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  Found to have tarry black stools in the ER.  Very likely has blood from the mass at the GE junction.  I have been called to consult for the same.  Plan 1.  Monitor CBC and transfuse as needed. 2.  EGD today with intention to apply Hemospray over the lesion to help provide time to commence radiation therapy as a more definitive form of control of blood loss.  Down the road if required , cryotherapy could also be considered as a mode of hemostasis   I have discussed alternative options, risks & benefits,  which include, but are not limited to, bleeding, infection, perforation,respiratory complication & drug reaction.  The patient agrees with this plan & written consent will be obtained.     Thank you for involving me in the care of this patient.      LOS: 1 day   [name], MD  05/04/2022, 10:35 AM

## 2022-05-04 NOTE — Telephone Encounter (Signed)
I will see her today. Anderson Malta please cancel ct

## 2022-05-04 NOTE — Anesthesia Preprocedure Evaluation (Signed)
Anesthesia Evaluation  Patient identified by MRN, date of birth, ID band Patient awake    Reviewed: Allergy & Precautions, NPO status , Patient's Chart, lab work & pertinent test results  History of Anesthesia Complications Negative for: history of anesthetic complications  Airway Mallampati: II  TM Distance: >3 FB Neck ROM: Full    Dental  (+) Teeth Intact   Pulmonary neg pulmonary ROS, neg sleep apnea, neg COPD, Patient abstained from smoking.Not current smoker,    Pulmonary exam normal breath sounds clear to auscultation       Cardiovascular Exercise Tolerance: Good METShypertension, Pt. on medications (-) CAD and (-) Past MI Normal cardiovascular exam+ dysrhythmias Atrial Fibrillation  Rhythm:Regular Rate:Normal  Unremarkable TTE 2020   Neuro/Psych negative neurological ROS  negative psych ROS   GI/Hepatic Neg liver ROS, GERD  Controlled and Medicated,  Endo/Other  neg diabetesHypothyroidism   Renal/GU negative Renal ROS  negative genitourinary   Musculoskeletal   Abdominal   Peds  Hematology  (+) Blood dyscrasia, anemia ,   Anesthesia Other Findings Past Medical History: No date: Anemia No date: Arthritis No date: Atrial fibrillation ([facility]) No date: B12 deficiency No date: Dysrhythmia No date: Hyperlipidemia No date: Hypertension No date: Hypothyroidism No date: Menopause No date: Palpitations     Comment:  Hx of, 25 years ago No date: Vertigo  Past Surgical History: 2008: BILATERAL SALPINGOOPHORECTOMY 08/24/2006: DILATION AND CURETTAGE OF UTERUS No date: incision tendon sheath for trigger finger; Right     Comment:  ring finger 2008: LAPAROSCOPIC SUPRACERVICAL HYSTERECTOMY     Comment:  Dr. Ammie Dalton; with BSO 03/2008: NOSE SURGERY  BMI    Body Mass Index: 29.49 kg/m      Reproductive/Obstetrics negative OB ROS                            Anesthesia  Physical  Anesthesia Plan  ASA: 3  Anesthesia Plan: General   Post-op Pain Management: Minimal or no pain anticipated   Induction: Intravenous  PONV Risk Score and Plan: 3 and Propofol infusion and TIVA  Airway Management Planned: Natural Airway  Additional Equipment: None  Intra-op Plan:   Post-operative Plan:   Informed Consent: I have reviewed the patients History and Physical, chart, labs and discussed the procedure including the risks, benefits and alternatives for the proposed anesthesia with the patient or authorized representative who has indicated his/her understanding and acceptance.     Dental Advisory Given  Plan Discussed with: Anesthesiologist, CRNA and Surgeon  Anesthesia Plan Comments: (No vomiting in past two days, should be appropriate for natural airway. Discussed risks of anesthesia with patient, including possibility of difficulty with spontaneous ventilation under anesthesia necessitating airway intervention, PONV, and rare risks such as cardiac or respiratory or neurological events, and allergic reactions. Discussed the role of CRNA in patient's perioperative care. Patient understands.)       Anesthesia Quick Evaluation

## 2022-05-04 NOTE — Anesthesia Procedure Notes (Signed)
Procedure Name: General with mask airway Date/Time: 05/04/2022 1:28 PM  Performed by: Kelton Pillar, CRNAPre-anesthesia Checklist: Patient identified, Emergency Drugs available, Suction available and Patient being monitored Patient Re-evaluated:Patient Re-evaluated prior to induction Oxygen Delivery Method: Simple face mask Induction Type: IV induction Placement Confirmation: positive ETCO2, CO2 detector and breath sounds checked- equal and bilateral Dental Injury: Teeth and Oropharynx as per pre-operative assessment

## 2022-05-04 NOTE — H&P (Signed)
Ariana Bellows, MD [address] [place], [place], [place], [address] [address], [place], [place], [place], [address] Phone: [phone]  Fax: [phone]  Primary Care Physician:  Rusty Aus, MD   Pre-Procedure History & Physical: HPI:  Ariana Wilson is a 61 y.o. female is here for an endoscopy    Past Medical History:  Diagnosis Date   Anemia    Arthritis    Atrial fibrillation ([place])    B12 deficiency    Dysrhythmia    Hyperlipidemia    Hypothyroidism    Menopause    Palpitations    Hx of, 25 years ago   Vertigo     Past Surgical History:  Procedure Laterality Date   BILATERAL SALPINGOOPHORECTOMY  2008   DILATION AND CURETTAGE OF UTERUS  08/24/2006   ESOPHAGOGASTRODUODENOSCOPY (EGD) WITH PROPOFOL N/A 03/01/2022   Procedure: ESOPHAGOGASTRODUODENOSCOPY (EGD) WITH PROPOFOL;  Surgeon: Lesly Rubenstein, MD;  Location: [facility];  Service: Endoscopy;  Laterality: N/A;   incision tendon sheath for trigger finger Right    ring finger   LAPAROSCOPIC SUPRACERVICAL HYSTERECTOMY  2008   Dr. Ammie Dalton; with BSO   NOSE SURGERY  03/2008   PORTA CATH INSERTION N/A 03/21/2022   Procedure: PORTA CATH INSERTION;  Surgeon: Algernon Huxley, MD;  Location: [facility];  Service: Cardiovascular;  Laterality: N/A;    Prior to Admission medications   Medication Sig Start Date End Date Taking? Authorizing Provider  Calcium Carbonate-Vitamin D 600-200 MG-UNIT TABS Take 1 tablet by mouth daily.     Yes [provider]  cetirizine (ZYRTEC) 10 MG tablet Take 10 mg by mouth daily.   Yes [provider]  Cholecalciferol 25 MCG (1000 UT) tablet Take 1,000 Units by mouth daily.   Yes [provider]  dexamethasone (DECADRON) 4 MG tablet Pt has 3 day chemo regimen and will need to take 2 tablets ( '8mg'$ ) on day 4 for each cycle of chemo. Make sure to eat food in the am and then take the med 03/30/22  Yes Sindy Guadeloupe, MD  diltiazem (CARDIZEM CD)  180 MG 24 hr capsule Take 180 mg by mouth daily. 02/26/22  Yes [provider]  flecainide (TAMBOCOR) 50 MG tablet Take 1 tablet by mouth 2 (two) times daily. 07/09/20  Yes [provider]  metoprolol succinate (TOPROL-XL) 25 MG 24 hr tablet Take 0.5 tablets (12.5 mg total) by mouth 2 (two) times daily. 04/17/22 05/17/22 Yes Nolberto Hanlon, MD  midodrine (PROAMATINE) 10 MG tablet Take 1 tablet (10 mg total) by mouth 3 (three) times daily with meals. 04/17/22 05/17/22 Yes Amery, Gwynneth Albright, MD  mometasone (NASONEX) 50 MCG/ACT nasal spray Place 2 sprays into the nose daily.   Yes [provider]  OLANZapine (ZYPREXA) 10 MG tablet Take 1 tablet (10 mg total) by mouth at bedtime. 05/02/22  Yes Sindy Guadeloupe, MD  ondansetron (ZOFRAN-ODT) 4 MG disintegrating tablet Take 1 tablet (4 mg total) by mouth every 8 (eight) hours as needed for nausea or vomiting. 05/02/22  Yes Borders, Kirt Boys, NP  pantoprazole (PROTONIX) 40 MG tablet Take 40 mg by mouth daily.   Yes [provider]  simvastatin (ZOCOR) 20 MG tablet TAKE 1 TABLET BY MOUTH EVERY EVENING. 12/25/17  Yes Gollan, Kathlene November, MD  Zinc Citrate-Phytase (ZYTAZE) 25-500 MG CAPS Take 1 Dose by mouth daily.   Yes [provider]  DHEA 25 MG tablet Take 25 mg by  mouth daily. Patient not taking: Reported on 05/03/2022    [provider]  diclofenac Sodium (VOLTAREN) 1 % GEL Apply 2 g topically 4 (four) times daily. Patient not taking: Reported on 05/03/2022 12/19/19   [provider]  fluconazole (DIFLUCAN) 150 MG tablet Take 1 tablet (150 mg total) by mouth every 3 (three) days. Patient not taking: Reported on 05/03/2022 04/19/22   Borders, Kirt Boys, NP  levofloxacin (LEVAQUIN) 500 MG tablet Take 1 tablet (500 mg total) by mouth daily. Patient not taking: Reported on 05/03/2022 04/19/22   Borders, Kirt Boys, NP  lidocaine-prilocaine (EMLA) cream Apply to affected area once Patient not taking: Reported on 05/03/2022 03/25/22    Sindy Guadeloupe, MD  LORazepam (ATIVAN) 0.5 MG tablet Take 1 tablet (0.5 mg total) by mouth every 6 (six) hours as needed (Nausea or vomiting). 03/29/22   Sindy Guadeloupe, MD  prochlorperazine (COMPAZINE) 10 MG tablet Take 1 tablet (10 mg total) by mouth every 6 (six) hours as needed (Nausea or vomiting). Patient not taking: Reported on 03/29/2022 03/25/22   Sindy Guadeloupe, MD  Saccharomyces boulardii (PROBIOTIC) 250 MG CAPS Take 1 capsule by mouth daily.    [provider]  vitamin B-12 (CYANOCOBALAMIN) 1000 MCG tablet Take 1,000 mcg by mouth daily. Patient not taking: Reported on 05/03/2022    [provider]    Allergies as of 05/03/2022 - Review Complete 05/03/2022  Allergen Reaction Noted   Sulfa antibiotics Hives 12/06/2018   Sulfonamide derivatives Swelling 02/18/2010    Family History  Problem Relation Age of Onset   Hyperlipidemia Mother    Hypertension Mother    Stroke Mother    Heart disease Father    Hyperlipidemia Father    Atrial fibrillation Father    Melanoma Father    Arrhythmia Sister        Atrial fibrillation   Heart disease Sister    Atrial fibrillation Sister    Heart attack Brother    Breast cancer Neg Hx     Social History   Socioeconomic History   Marital status: Married    Spouse name: Not on file   Number of children: Not on file   Years of education: Not on file   Highest education level: Not on file  Occupational History   Occupation: Full time  Tobacco Use   Smoking status: Never   Smokeless tobacco: Never  Vaping Use   Vaping Use: Never used  Substance and Sexual Activity   Alcohol use: Never   Drug use: Never   Sexual activity: Not Currently    Birth control/protection: Surgical    Comment: Hysterectomy  Other Topics Concern   Not on file  Social History Narrative   Married with 2 children   Gets regular exercise   Social Determinants of Health   Financial Resource Strain: Low Risk  (04/20/2022)   Overall  Financial Resource Strain (CARDIA)    Difficulty of Paying Living Expenses: Not very hard  Food Insecurity: No Food Insecurity (04/20/2022)   Hunger Vital Sign    Worried About Running Out of Food in the Last Year: Never true    Ran Out of Food in the Last Year: Never true  Transportation Needs: No Transportation Needs (04/20/2022)   PRAPARE - Hydrologist (Medical): No    Lack of Transportation (Non-Medical): No  Physical Activity: Inactive (04/20/2022)   Exercise Vital Sign    Days of Exercise per Week: 0 days  Minutes of Exercise per Session: 0 min  Stress: No Stress Concern Present (04/20/2022)   [place]    Feeling of Stress : Only a little  Social Connections: Socially Integrated (04/20/2022)   Social Connection and Isolation Panel [NHANES]    Frequency of Communication with Friends and Family: More than three times a week    Frequency of Social Gatherings with Friends and Family: More than three times a week    Attends Religious Services: More than 4 times per year    Active Member of [employer] or Organizations: Yes    Attends Music therapist: More than 4 times per year    Marital Status: Married  Human resources officer Violence: Not on file    Review of Systems: See HPI, otherwise negative ROS  Physical Exam: BP (!) 106/49 (BP Location: Left Arm)   Pulse (!) 55   Temp 98.1 F (36.7 C) (Oral)   Resp (!) 28   Ht '5\' 1"'$  (1.549 m)   Wt 70.8 kg   SpO2 100%   BMI 29.49 kg/m  General:   Alert,  pleasant and cooperative in NAD Head:  Normocephalic and atraumatic. Neck:  Supple; no masses or thyromegaly. Lungs:  Clear throughout to auscultation, normal respiratory effort.    Heart:  +S1, +S2, Regular rate and rhythm, No edema. Abdomen:  Soft, nontender and nondistended. Normal bowel sounds, without guarding, and without rebound.   Neurologic:  Alert and  oriented x4;  grossly  normal neurologically.  Impression/Plan: Ariana Wilson is here for an endoscopy  to be performed for  evaluation of melena    Risks, benefits, limitations, and alternatives regarding endoscopy have been reviewed with the patient.  Questions have been answered.  All parties agreeable.   Ariana Bellows, MD  05/04/2022, 1:09 PM

## 2022-05-04 NOTE — Telephone Encounter (Signed)
Patient husband called asking if patient still needs CT Friday since she had one in ER. Of note, she is now in ICU with GI Bleed. Please advise

## 2022-05-05 ENCOUNTER — Other Ambulatory Visit: Payer: Self-pay

## 2022-05-05 ENCOUNTER — Encounter: Payer: Self-pay | Admitting: Internal Medicine

## 2022-05-05 DIAGNOSIS — R571 Hypovolemic shock: Secondary | ICD-10-CM | POA: Diagnosis not present

## 2022-05-05 DIAGNOSIS — K921 Melena: Secondary | ICD-10-CM | POA: Diagnosis not present

## 2022-05-05 DIAGNOSIS — C7A8 Other malignant neuroendocrine tumors: Secondary | ICD-10-CM | POA: Diagnosis not present

## 2022-05-05 LAB — CBC
HCT: 27.9 % — ABNORMAL LOW (ref 36.0–46.0)
Hemoglobin: 9.3 g/dL — ABNORMAL LOW (ref 12.0–15.0)
MCH: 28.6 pg (ref 26.0–34.0)
MCHC: 33.3 g/dL (ref 30.0–36.0)
MCV: 85.8 fL (ref 80.0–100.0)
Platelets: 64 10*3/uL — ABNORMAL LOW (ref 150–400)
RBC: 3.25 MIL/uL — ABNORMAL LOW (ref 3.87–5.11)
RDW: 15.5 % (ref 11.5–15.5)
WBC: 2.5 10*3/uL — ABNORMAL LOW (ref 4.0–10.5)
nRBC: 0 % (ref 0.0–0.2)

## 2022-05-05 LAB — BASIC METABOLIC PANEL
Anion gap: 5 (ref 5–15)
BUN: 9 mg/dL (ref 8–23)
CO2: 26 mmol/L (ref 22–32)
Calcium: 8.2 mg/dL — ABNORMAL LOW (ref 8.9–10.3)
Chloride: 110 mmol/L (ref 98–111)
Creatinine, Ser: 0.56 mg/dL (ref 0.44–1.00)
GFR, Estimated: >60 mL/min (ref >60)
Glucose, Bld: 153 mg/dL — ABNORMAL HIGH (ref 70–99)
Potassium: 3.4 mmol/L — ABNORMAL LOW (ref 3.5–5.1)
Sodium: 141 mmol/L (ref 135–145)

## 2022-05-05 LAB — MAGNESIUM: Magnesium: 1.8 mg/dL (ref 1.7–2.4)

## 2022-05-05 LAB — PHOSPHORUS: Phosphorus: 3.8 mg/dL (ref 2.5–4.6)

## 2022-05-05 LAB — PROCALCITONIN: Procalcitonin: <0.1 ng/mL

## 2022-05-05 MED ORDER — OYSTER SHELL CALCIUM/D3 500-5 MG-MCG PO TABS
1.0000 | ORAL_TABLET | Freq: Every day | ORAL | Status: DC
  Administered 2022-05-05 – 2022-05-06 (×2): 1 via ORAL
  Filled 2022-05-05 (×2): qty 1

## 2022-05-05 MED ORDER — FLECAINIDE ACETATE 50 MG PO TABS
50.0000 mg | ORAL_TABLET | Freq: Two times a day (BID) | ORAL | Status: DC
  Administered 2022-05-05 – 2022-05-06 (×2): 50 mg via ORAL
  Filled 2022-05-05 (×4): qty 1

## 2022-05-05 MED ORDER — PANTOPRAZOLE SODIUM 40 MG PO TBEC
40.0000 mg | DELAYED_RELEASE_TABLET | Freq: Two times a day (BID) | ORAL | Status: DC
  Administered 2022-05-05 – 2022-05-06 (×2): 40 mg via ORAL
  Filled 2022-05-05 (×2): qty 1

## 2022-05-05 MED ORDER — SIMVASTATIN 20 MG PO TABS
20.0000 mg | ORAL_TABLET | Freq: Every evening | ORAL | Status: DC
  Administered 2022-05-05: 20 mg via ORAL
  Filled 2022-05-05: qty 1

## 2022-05-05 MED ORDER — MIDODRINE HCL 5 MG PO TABS
10.0000 mg | ORAL_TABLET | Freq: Three times a day (TID) | ORAL | Status: DC
  Administered 2022-05-05 – 2022-05-06 (×4): 10 mg via ORAL
  Filled 2022-05-05 (×4): qty 2

## 2022-05-05 MED ORDER — ENSURE ENLIVE PO LIQD
237.0000 mL | Freq: Three times a day (TID) | ORAL | Status: DC
  Administered 2022-05-06: 237 mL via ORAL

## 2022-05-05 MED ORDER — ADULT MULTIVITAMIN LIQUID CH
15.0000 mL | Freq: Every day | ORAL | Status: DC
  Administered 2022-05-06: 15 mL
  Filled 2022-05-05: qty 15

## 2022-05-05 MED ORDER — VITAMIN D 25 MCG (1000 UNIT) PO TABS
1000.0000 [IU] | ORAL_TABLET | Freq: Every day | ORAL | Status: DC
  Administered 2022-05-05 – 2022-05-06 (×2): 1000 [IU] via ORAL
  Filled 2022-05-05 (×4): qty 1

## 2022-05-05 MED ORDER — SACCHAROMYCES BOULARDII 250 MG PO CAPS
250.0000 mg | ORAL_CAPSULE | Freq: Every day | ORAL | Status: DC
  Administered 2022-05-05 – 2022-05-06 (×2): 250 mg via ORAL
  Filled 2022-05-05 (×2): qty 1

## 2022-05-05 MED ORDER — POTASSIUM CHLORIDE 10 MEQ/100ML IV SOLN
10.0000 meq | Freq: Once | INTRAVENOUS | Status: AC
  Administered 2022-05-05: 10 meq via INTRAVENOUS
  Filled 2022-05-05: qty 100

## 2022-05-05 MED ORDER — OLANZAPINE 10 MG PO TABS
10.0000 mg | ORAL_TABLET | Freq: Every day | ORAL | Status: DC
  Administered 2022-05-05: 10 mg via ORAL
  Filled 2022-05-05: qty 1

## 2022-05-05 MED ORDER — FLUTICASONE PROPIONATE 50 MCG/ACT NA SUSP
1.0000 | Freq: Every day | NASAL | Status: DC
  Administered 2022-05-05 – 2022-05-06 (×2): 1 via NASAL
  Filled 2022-05-05: qty 16

## 2022-05-05 NOTE — Progress Notes (Signed)
[facility] NAME: Ariana Wilson    MR#:  [id number]  DATE OF BIRTH:  [birth year]  SUBJECTIVE:  husband at bedside. Patient overall feeling better. Tolerating PO diet. No nausea vomiting.    VITALS:  Blood pressure (!) 98/56, pulse 66, temperature 98.3 F (36.8 C), temperature source Oral, resp. rate 18, height '5\' 1"'$  (1.549 m), weight 70.8 kg, SpO2 100 %.  PHYSICAL EXAMINATION:   GENERAL:  61 y.o.-year-old patient lying in the bed with no acute distress. Alopecia chemo induced LUNGS: Normal breath sounds bilaterally, no wheezing, rales, rhonchi. Port+ CARDIOVASCULAR: S1, S2 normal. No murmurs, rubs, or gallops.  ABDOMEN: Soft, nontender, nondistended. Bowel sounds present.  EXTREMITIES: No  edema b/l.    NEUROLOGIC: nonfocal  patient is alert and awake SKIN: No obvious rash, lesion, or ulcer.   LABORATORY PANEL:  CBC Recent Labs  Lab 05/05/22 0448  WBC 2.5*  HGB 9.3*  HCT 27.9*  PLT 64*    Chemistries  Recent Labs  Lab 05/03/22 1128 05/04/22 0152 05/05/22 0448  NA 132*   < > 141  K 3.1*   < > 3.4*  CL 101   < > 110  CO2 24   < > 26  GLUCOSE 121*   < > 153*  BUN 36*   < > 9  CREATININE 0.62   < > 0.56  CALCIUM 7.6*   < > 8.2*  MG  --    < > 1.8  AST 15  --   --   ALT 10  --   --   ALKPHOS 53  --   --   BILITOT 0.9  --   --    < > = values in this interval not displayed.   Cardiac Enzymes No results for input(s): "TROPONINI" in the last 168 hours. RADIOLOGY:  CT Angio Abd/Pel W and/or Wo Contrast  Result Date: 05/03/2022 CLINICAL DATA:  Esophageal carcinoma, lymphadenopathy, GI bleed, melena EXAM: CTA ABDOMEN AND PELVIS WITHOUT AND WITH CONTRAST TECHNIQUE: Multidetector CT imaging of the abdomen and pelvis was performed using the standard protocol during bolus administration of intravenous contrast. Multiplanar reconstructed images and MIPs were obtained and reviewed to evaluate the vascular anatomy. RADIATION  DOSE REDUCTION: This exam was performed according to the departmental dose-optimization program which includes automated exposure control, adjustment of the mA and/or kV according to patient size and/or use of iterative reconstruction technique. CONTRAST:  66m OMNIPAQUE IOHEXOL 350 MG/ML SOLN COMPARISON:  PET-CT 03/22/2022 and previous FINDINGS: VASCULAR Aorta: Mild calcified atheromatous plaque. No aneurysm, dissection, or stenosis. Celiac: Patent without evidence of aneurysm, dissection, vasculitis or significant stenosis. SMA: Patent without evidence of aneurysm, dissection, vasculitis or significant stenosis. Renals: Single left, widely patent. Duplicated right, superior dominant, both patent. IMA: Patent without evidence of aneurysm, dissection, vasculitis or significant stenosis. Inflow: Minimal calcified plaque. No aneurysm, dissection, or stenosis. Proximal Outflow: Bilateral common femoral and visualized portions of the superficial and profunda femoral arteries are patent without evidence of aneurysm, dissection, vasculitis or significant stenosis. Veins: Patent hepatic veins, portal vein, SM V, splenic vein, bilateral renal veins. Incomplete opacification of the iliac venous system and IVC, which appear grossly unremarkable. No venous pathology is evident. Review of the MIP images confirms the above findings. NON-VASCULAR Lower chest: Catheter tip in the right atrium. No pleural or pericardial effusion. Visualized lung bases clear. Hepatobiliary: No focal liver abnormality is seen. No gallstones, gallbladder wall thickening, or biliary  dilatation. Pancreas: Unremarkable. No pancreatic ductal dilatation or surrounding inflammatory changes. Spleen: Normal in size without focal abnormality. Adrenals/Urinary Tract: No adrenal mass. Symmetric renal enhancement without focal lesion or hydronephrosis. Urinary bladder is physiologically distended. Stomach/Bowel: Eccentric soft tissue mass at the GE junction with  intraluminal extension. The stomach is incompletely distended. Small bowel decompressed. Scattered oral contrast material in the stomach and small bowel limiting sensitivity for acute GI bleed. The colon is incompletely distended, without evidence of active bleed. Lymphatic: 2.9 cm left para-aortic mass, previously 2.5 cm. 1.5 cm enlarged aortocaval lymph node. Enhancing periportal and gastrohepatic ligament adenopathy as before. Reproductive: Status post hysterectomy. No adnexal masses. Other: Bilateral pelvic phleboliths.  No ascites.  No free air. Musculoskeletal: No acute or significant osseous findings. IMPRESSION: 1. No evidence of active GI bleed, sensitivity somewhat decreased by residual oral contrast material in the stomach and small bowel. 2. Some interval progression of mesenteric and retroperitoneal adenopathy since PET-CT 03/22/2022. 3. Mass at the GE junction as before. Electronically Signed   By: Lucrezia Europe M.D.   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  Since discharge from the ED she has felt progressively weaker, fatigued, along with nausea and vomiting (non-bloody) which she attributed to chemotherapy.    Hypovolemic shock due to suspected G.I. bleed with dehydration along with nausea vomiting post chemo -- patient came in with hemoglobin of 6.7--- received blood transfusion hemoglobin remains 9.8 -- nausea vomiting resolved -- received IV fluids and patient has been off IV vasopressin --  shock improved -- discontinue IV antibiotics no evidence of infection  Acute blood loss anemia superimposed on chronic anemia suspected due to acute G.I. bleed in the setting of GE junction neuroendocrine tumor -- CT scan negative for active bleeding -- patient underwent EGD she did not show any active bleed -- seen by Dr. Vicente Males -- patient was placed on Protonix and octreotide gtt--now off -- should follow-up with Dr. Janese Banks as outpatient  Mild hyponatremia due to dehydration resolved  History of neutropenic fever with UTI -- currently afebrile no signs of fever or infection -- DC IV antibiotic discussed with oncology Dr. Janese Banks  History of a fib -- will resume flecainide for now. -- Patient also takes metoprolol and diltiazem however since blood pressure is soft will hold it  Relative hypotension -- resume midodrine  Patient ambulated in ICU hallways. Hemodynamically stable. Heart rate remains stable. Will transfer to [facility] floor.  If remains stable will discharged tomorrow. This was discussed with oncology, patient, husband and in agreement   Family communication :husband Consults : oncology, G.I. CODE STATUS: full DVT Prophylaxis : ambulation Level of care: Med-Surg Status is: Inpatient Remains inpatient appropriate because: monitor overnite to ensure remains [place]    TOTAL TIME TAKING CARE OF THIS PATIENT: 35 minutes.  >50% time spent on counselling and coordination of care  Note: This dictation was prepared with Dragon dictation along with smaller phrase technology. Any transcriptional errors that result from this process are unintentional.  Ariana Wilson  Posey Pronto M.D    [facility]   CC: Primary care physician;

## 2022-05-05 NOTE — Progress Notes (Signed)
[name] , MD [address], [place], [place], [place], [address] [address], [place], [place], [place], [address] Phone: [phone]  Fax: [phone]   Ariana Wilson is being followed for upper GI bleed day 1 of follow up   Subjective: Doing well no complaints.    Objective: Vital signs in last 24 hours: Vitals:   05/05/22 0800 05/05/22 0900 05/05/22 0915 05/05/22 0930  BP: 135/74 (!) 100/44 (!) 93/49 (!) 96/51  Pulse: 77 70 73 69  Resp: 13 13 (!) 22 13  Temp:      TempSrc:      SpO2: 100% 100% 99% 97%  Weight:      Height:       Weight change:   Intake/Output Summary (Last 24 hours) at 05/05/2022 0950 Last data filed at 05/05/2022 5784 Gross per 24 hour  Intake 2487.55 ml  Output --  Net 2487.55 ml     Exam:  Abdomen: soft, nontender, normal bowel sounds   Lab Results: '@LABTEST2'$ @ Micro Results: Recent Results (from the past 240 hour(s))  MRSA Next Gen by PCR, Nasal     Status: None   Collection Time: 05/03/22  5:51 PM   Specimen: Nasal Mucosa; Nasal Swab  Result Value Ref Range Status   MRSA by PCR Next Gen NOT DETECTED NOT DETECTED Final    Comment: (NOTE) The GeneXpert MRSA Assay (FDA approved for NASAL specimens only), is one component of a comprehensive MRSA colonization surveillance program. It is not intended to diagnose MRSA infection nor to guide or monitor treatment for MRSA infections. Test performance is not FDA approved in patients less than 79 years old. Performed at [facility], [address] [place], [place] [address]    Studies/Results: CT Angio Abd/Pel W and/or Wo Contrast  Result Date: 05/03/2022 CLINICAL DATA:  Esophageal carcinoma, lymphadenopathy, GI bleed, melena EXAM: CTA ABDOMEN AND PELVIS WITHOUT AND WITH CONTRAST TECHNIQUE: Multidetector CT imaging of the abdomen and pelvis was performed using the standard protocol during bolus administration of intravenous contrast. Multiplanar reconstructed images  and MIPs were obtained and reviewed to evaluate the vascular anatomy. RADIATION DOSE REDUCTION: This exam was performed according to the departmental dose-optimization program which includes automated exposure control, adjustment of the mA and/or kV according to patient size and/or use of iterative reconstruction technique. CONTRAST:  40m OMNIPAQUE IOHEXOL 350 MG/ML SOLN COMPARISON:  PET-CT 03/22/2022 and previous FINDINGS: VASCULAR Aorta: Mild calcified atheromatous plaque. No aneurysm, dissection, or stenosis. Celiac: Patent without evidence of aneurysm, dissection, vasculitis or significant stenosis. SMA: Patent without evidence of aneurysm, dissection, vasculitis or significant stenosis. Renals: Single left, widely patent. Duplicated right, superior dominant, both patent. IMA: Patent without evidence of aneurysm, dissection, vasculitis or significant stenosis. Inflow: Minimal calcified plaque. No aneurysm, dissection, or stenosis. Proximal Outflow: Bilateral common femoral and visualized portions of the superficial and profunda femoral arteries are patent without evidence of aneurysm, dissection, vasculitis or significant stenosis. Veins: Patent hepatic veins, portal vein, SM V, splenic vein, bilateral renal veins. Incomplete opacification of the iliac venous system and IVC, which appear grossly unremarkable. No venous pathology is evident. Review of the MIP images confirms the above findings. NON-VASCULAR Lower chest: Catheter tip in the right atrium. No pleural or pericardial effusion. Visualized lung bases clear. Hepatobiliary: No focal liver abnormality is seen. No gallstones, gallbladder wall thickening, or biliary dilatation. Pancreas: Unremarkable. No pancreatic ductal dilatation or surrounding inflammatory changes. Spleen: Normal in size without focal abnormality. Adrenals/Urinary Tract: No adrenal  mass. Symmetric renal enhancement without focal lesion or hydronephrosis. Urinary bladder is  physiologically distended. Stomach/Bowel: Eccentric soft tissue mass at the GE junction with intraluminal extension. The stomach is incompletely distended. Small bowel decompressed. Scattered oral contrast material in the stomach and small bowel limiting sensitivity for acute GI bleed. The colon is incompletely distended, without evidence of active bleed. Lymphatic: 2.9 cm left para-aortic mass, previously 2.5 cm. 1.5 cm enlarged aortocaval lymph node. Enhancing periportal and gastrohepatic ligament adenopathy as before. Reproductive: Status post hysterectomy. No adnexal masses. Other: Bilateral pelvic phleboliths.  No ascites.  No free air. Musculoskeletal: No acute or significant osseous findings. IMPRESSION: 1. No evidence of active GI bleed, sensitivity somewhat decreased by residual oral contrast material in the stomach and small bowel. 2. Some interval progression of mesenteric and retroperitoneal adenopathy since PET-CT 03/22/2022. 3. Mass at the GE junction as before. Electronically Signed   By: Lucrezia Europe M.D.   On: 05/03/2022 15:43   DG Chest 2 View  Result Date: 05/03/2022 CLINICAL DATA:  Weakness, hypotension EXAM: CHEST - 2 VIEW COMPARISON:  05/01/2022 FINDINGS: Right IJ approach chest port remains in place. The heart size and mediastinal contours are within normal limits. Both lungs are clear. The visualized skeletal structures are unremarkable. IMPRESSION: No active cardiopulmonary disease. Electronically Signed   By: Davina Poke D.O.   On: 05/03/2022 12:49   Medications: I have reviewed the patient's current medications. Scheduled Meds:  calcium-vitamin D  1 tablet Oral Daily   Chlorhexidine Gluconate Cloth  6 each Topical Daily   cholecalciferol  1,000 Units Oral Daily   fluticasone  1 spray Each Nare Daily   midodrine  10 mg Oral TID WC   OLANZapine  10 mg Oral QHS   ondansetron (ZOFRAN) IV  4 mg Intravenous Once   pantoprazole  40 mg Oral BID   saccharomyces boulardii  250 mg  Oral Daily   simvastatin  20 mg Oral QPM   Continuous Infusions:  potassium chloride     PRN Meds:.docusate sodium, polyethylene glycol   Assessment: Principal Problem:   GI bleeding Active Problems:   Primary malignant neuroendocrine tumor of esophagus (HCC)   Hypovolemic shock (HCC)  TISHA CLINE is a 61 y.o. 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  Found to have tarry black stools in the ER.  EGD on 05/04/2022 showed a large mass at the GE junction with bleeding on contact Hemospray applied with good result.  This should hopefully provide some hemostatic benefit for days if not weeks or longer.  This procedure can be repeated if needed.  Other option in the future if there is further bleeding is to consider cryotherapy.  Octreotide can be stopped continue at home on PPI dose.  I will sign off.  Please call me if any further GI concerns or questions.  We would like to thank you for the opportunity to participate in the care of VINEY ACOCELLA.    LOS: 2 days   [name], MD 05/05/2022, 9:51 AM

## 2022-05-05 NOTE — Consult Note (Signed)
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 Patient s/p transfusion for anemia. Requiring vasopressors. Pharmacy consulted to assist with electrolyte monitoring and replacement as indicated.  Goal of Therapy:  Electrolytes within normal limits  Plan:  --10 mEq IV KCl x 1 --Follow-up electrolytes with AM labs tomorrow  Dallie Piles 05/05/2022 6:57 AM

## 2022-05-05 NOTE — Plan of Care (Signed)

## 2022-05-06 ENCOUNTER — Inpatient Hospital Stay: Admission: RE | Admit: 2022-05-06 | Payer: BC Managed Care – PPO | Source: Ambulatory Visit

## 2022-05-06 DIAGNOSIS — K921 Melena: Secondary | ICD-10-CM | POA: Diagnosis not present

## 2022-05-06 LAB — BASIC METABOLIC PANEL
Anion gap: 7 (ref 5–15)
BUN: 9 mg/dL (ref 8–23)
CO2: 24 mmol/L (ref 22–32)
Calcium: 8.1 mg/dL — ABNORMAL LOW (ref 8.9–10.3)
Chloride: 108 mmol/L (ref 98–111)
Creatinine, Ser: 0.65 mg/dL (ref 0.44–1.00)
GFR, Estimated: >60 mL/min (ref >60)
Glucose, Bld: 101 mg/dL — ABNORMAL HIGH (ref 70–99)
Potassium: 2.8 mmol/L — ABNORMAL LOW (ref 3.5–5.1)
Sodium: 139 mmol/L (ref 135–145)

## 2022-05-06 LAB — POTASSIUM
Potassium: 2.8 mmol/L — ABNORMAL LOW (ref 3.5–5.1)
Potassium: 4.2 mmol/L (ref 3.5–5.1)

## 2022-05-06 LAB — PHOSPHORUS: Phosphorus: 3.1 mg/dL (ref 2.5–4.6)

## 2022-05-06 LAB — MAGNESIUM: Magnesium: 1.7 mg/dL (ref 1.7–2.4)

## 2022-05-06 MED ORDER — POTASSIUM CHLORIDE CRYS ER 20 MEQ PO TBCR
60.0000 meq | EXTENDED_RELEASE_TABLET | Freq: Once | ORAL | Status: AC
  Administered 2022-05-06: 60 meq via ORAL
  Filled 2022-05-06: qty 3

## 2022-05-06 MED ORDER — POTASSIUM CHLORIDE CRYS ER 20 MEQ PO TBCR
40.0000 meq | EXTENDED_RELEASE_TABLET | Freq: Once | ORAL | Status: AC
Start: 2022-05-06 — End: 2022-05-06
  Administered 2022-05-06: 40 meq via ORAL
  Filled 2022-05-06: qty 2

## 2022-05-06 MED ORDER — MAGNESIUM SULFATE 2 GM/50ML IV SOLN
2.0000 g | Freq: Once | INTRAVENOUS | Status: AC
  Administered 2022-05-06: 2 g via INTRAVENOUS
  Filled 2022-05-06: qty 50

## 2022-05-06 MED ORDER — POTASSIUM CHLORIDE 10 MEQ/100ML IV SOLN
10.0000 meq | INTRAVENOUS | Status: AC
  Administered 2022-05-06 (×2): 10 meq via INTRAVENOUS
  Filled 2022-05-06 (×2): qty 100

## 2022-05-06 NOTE — Progress Notes (Signed)
Mobility Specialist - Progress Note   05/06/22 0900  Mobility  Activity Ambulated independently in hallway;Stood at bedside;Dangled on edge of bed  Level of Assistance Independent  Assistive Device None  Distance Ambulated (ft) 480 ft  Activity Response Tolerated well  $Mobility charge 1 Mobility   Pt supine in bed on RA upon arrival. Pt STS and ambulates in hallway indep. Pt returns to bed with needs in reach and husband in room.   Gretchen Short  Mobility Specialist  05/06/22 9:17 AM

## 2022-05-06 NOTE — Progress Notes (Signed)
Discharge instructions were reviewed with pt and her husband. Questions were encourage and answered. Right chest port was de-accessed. Belongings were given to pt.

## 2022-05-06 NOTE — Progress Notes (Signed)
Mobility Specialist - Progress Note   05/06/22 1333  Mobility  Activity Ambulated independently in hallway  Level of Assistance Independent  Assistive Device None  Distance Ambulated (ft) 500 ft  Activity Response Tolerated well  $Mobility charge 1 Mobility   Pt OOB in doorway on RA upon arrival. Pt ambulates in hallway indep. Pt returns to bed with needs in reach and family in room.   Gretchen Short  Mobility Specialist  05/06/22 1:35 PM

## 2022-05-06 NOTE — Discharge Summary (Signed)
Physician Discharge Summary   Patient: Ariana Wilson MRN: [id number] DOB: [birth year]  Admit date:     05/03/2022  Discharge date: 05/06/22  Discharge Physician: Fritzi Mandes   PCP: Rusty Aus, MD   Recommendations at discharge:   follow-up Dr. Emily Filbert in 1 to 2 weeks follow-up Dr. Janese Banks at the cancer center on your scheduled appointment  Discharge Diagnoses: hypovolemic shock suspected due to G.I. bleed with dehydration now improved acute on chronic anemia due to blood loss in the setting of G.I. 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  Since discharge from the ED she has felt progressively weaker, fatigued, along with nausea and vomiting (non-bloody) which she attributed to chemotherapy.     Hypovolemic shock due to suspected G.I. bleed with dehydration along with nausea vomiting post chemo -- patient came in with hemoglobin of 6.7--- received blood transfusion hemoglobin remains 9.8 -- nausea vomiting resolved -- received IV fluids and patient has been off IV vasopressor -- shock improved -- discontinue IV antibiotics no evidence of infection   Acute blood loss anemia superimposed on chronic anemia suspected due to acute G.I. bleed in the setting of GE junction neuroendocrine tumor -- CT scan negative for active bleeding -- patient underwent EGD she did not show any active bleed -- seen by Dr. Vicente Males -- patient was placed on Protonix and octreotide  gtt--now off -- should follow-up with Dr. Janese Banks as outpatient   Mild hyponatremia due to dehydration resolved   History of neutropenic fever with UTI -- currently afebrile no signs of fever or infection -- DC IV antibiotic discussed with oncology Dr. Janese Banks   History of a fib -- will resume flecainide  -- Patient also takes metoprolol and diltiazem however since blood pressure is soft will hold it--pt aware   Relative hypotension -- resume midodrine  Hypokalemia now replete did with PO and IV potassium  Patient ambulating in the hallways with her husband overall feels back to baseline will discharged to home with outpatient follow-up Dr. Sabra Heck and Dr. Deidre Ala      Pain control - [facility] Controlled Substance Reporting System database was reviewed. and patient was instructed, not to drive, operate heavy machinery, perform activities at heights, swimming or participation in water activities or provide baby-sitting services while on Pain, Sleep and Anxiety Medications; until their outpatient Physician has advised to do so again. Also recommended to not to take more than prescribed Pain, Sleep and Anxiety Medications.  Consultants: Dr Janese Banks, Dr Vicente Males Procedures performed: EGD Disposition: Home Diet recommendation:  Discharge Diet Orders (From admission, onward)     Start     Ordered   05/06/22 0000  Diet - low sodium heart healthy        05/06/22 1555           Cardiac diet DISCHARGE MEDICATION: Allergies as of 05/06/2022       Reactions   Sulfa Antibiotics Hives   Says her tongue swelled a bit   Sulfonamide Derivatives  Swelling   Tongue swelling        Medication List     STOP taking these medications    diltiazem 180 MG 24 hr capsule Commonly known as: CARDIZEM CD   metoprolol succinate 25 MG 24 hr tablet Commonly known as: TOPROL-XL       TAKE these medications    Calcium Carbonate-Vitamin D 600-200 MG-UNIT Tabs Take 1 tablet by mouth daily.    cetirizine 10 MG tablet Commonly known as: ZYRTEC Take 10 mg by mouth daily.   Cholecalciferol 25 MCG (1000 UT) tablet Take 1,000 Units by mouth daily.   dexamethasone 4 MG tablet Commonly known as: DECADRON Pt has 3 day chemo regimen and will need to take 2 tablets ( '8mg'$ ) on day 4 for each cycle of chemo. Make sure to eat food in the am and then take the med   flecainide 50 MG tablet Commonly known as: TAMBOCOR Take 1 tablet by mouth 2 (two) times daily.   midodrine 10 MG tablet Commonly known as: PROAMATINE Take 1 tablet (10 mg total) by mouth 3 (three) times daily with meals.   mometasone 50 MCG/ACT nasal spray Commonly known as: NASONEX Place 2 sprays into the nose daily.   OLANZapine 10 MG tablet Commonly known as: ZYPREXA Take 1 tablet (10 mg total) by mouth at bedtime.   ondansetron 4 MG disintegrating tablet Commonly known as: ZOFRAN-ODT Take 1 tablet (4 mg total) by mouth every 8 (eight) hours as needed for nausea or vomiting.   pantoprazole 40 MG tablet Commonly known as: PROTONIX Take 40 mg by mouth daily.   Probiotic 250 MG Caps Take 1 capsule by mouth daily.   simvastatin 20 MG tablet Commonly known as: ZOCOR TAKE 1 TABLET BY MOUTH EVERY EVENING.   Zytaze 25-500 MG Caps Generic drug: Zinc Citrate-Phytase Take 1 Dose by mouth daily.        Follow-up Information     Rusty Aus, MD. Schedule an appointment as soon as possible for a visit in 1 week(s).   Specialty: Internal Medicine Contact information: [place] [address] [phone]         Sindy Guadeloupe, MD. Go to.   Specialty: Oncology Why: on your next appt Contact information: [place]  [address] [phone]                Discharge Exam: Danley Danker Weights   05/03/22 1745 05/06/22 0500  Weight: 70.8 kg 64.1 kg     Condition at discharge: fair  The results of significant diagnostics  from this hospitalization (including imaging, microbiology, ancillary and laboratory) are listed below for reference.   Imaging Studies: CT Angio Abd/Pel W and/or Wo Contrast  Result Date: 05/03/2022 CLINICAL DATA:  Esophageal carcinoma, lymphadenopathy, GI bleed, melena EXAM: CTA ABDOMEN AND PELVIS WITHOUT AND WITH CONTRAST TECHNIQUE: Multidetector CT imaging of the abdomen and pelvis was performed using the standard protocol during bolus administration of intravenous contrast. Multiplanar reconstructed images and MIPs were obtained and reviewed to evaluate the vascular anatomy. RADIATION DOSE REDUCTION: This exam was performed according to the departmental dose-optimization program which includes automated exposure control, adjustment of the mA and/or kV according to patient size and/or use of iterative reconstruction technique. CONTRAST:  16m OMNIPAQUE IOHEXOL 350 MG/ML SOLN COMPARISON:  PET-CT 03/22/2022 and previous FINDINGS: VASCULAR Aorta: Mild calcified atheromatous plaque. No aneurysm, dissection, or stenosis. Celiac: Patent without evidence of aneurysm, dissection, vasculitis or  significant stenosis. SMA: Patent without evidence of aneurysm, dissection, vasculitis or significant stenosis. Renals: Single left, widely patent. Duplicated right, superior dominant, both patent. IMA: Patent without evidence of aneurysm, dissection, vasculitis or significant stenosis. Inflow: Minimal calcified plaque. No aneurysm, dissection, or stenosis. Proximal Outflow: Bilateral common femoral and visualized portions of the superficial and profunda femoral arteries are patent without evidence of aneurysm, dissection, vasculitis or significant stenosis. Veins: Patent hepatic veins, portal vein, SM V, splenic vein, bilateral renal veins. Incomplete opacification of the iliac venous system and IVC, which appear grossly unremarkable. No venous pathology is evident. Review of the MIP images confirms the above findings.  NON-VASCULAR Lower chest: Catheter tip in the right atrium. No pleural or pericardial effusion. Visualized lung bases clear. Hepatobiliary: No focal liver abnormality is seen. No gallstones, gallbladder wall thickening, or biliary dilatation. Pancreas: Unremarkable. No pancreatic ductal dilatation or surrounding inflammatory changes. Spleen: Normal in size without focal abnormality. Adrenals/Urinary Tract: No adrenal mass. Symmetric renal enhancement without focal lesion or hydronephrosis. Urinary bladder is physiologically distended. Stomach/Bowel: Eccentric soft tissue mass at the GE junction with intraluminal extension. The stomach is incompletely distended. Small bowel decompressed. Scattered oral contrast material in the stomach and small bowel limiting sensitivity for acute GI bleed. The colon is incompletely distended, without evidence of active bleed. Lymphatic: 2.9 cm left para-aortic mass, previously 2.5 cm. 1.5 cm enlarged aortocaval lymph node. Enhancing periportal and gastrohepatic ligament adenopathy as before. Reproductive: Status post hysterectomy. No adnexal masses. Other: Bilateral pelvic phleboliths.  No ascites.  No free air. Musculoskeletal: No acute or significant osseous findings. IMPRESSION: 1. No evidence of active GI bleed, sensitivity somewhat decreased by residual oral contrast material in the stomach and small bowel. 2. Some interval progression of mesenteric and retroperitoneal adenopathy since PET-CT 03/22/2022. 3. Mass at the GE junction as before. Electronically Signed   By: Lucrezia Europe M.D.   On: 05/03/2022 15:43   DG Chest 2 View  Result Date: 05/03/2022 CLINICAL DATA:  Weakness, hypotension EXAM: CHEST - 2 VIEW COMPARISON:  05/01/2022 FINDINGS: Right IJ approach chest port remains in place. The heart size and mediastinal contours are within normal limits. Both lungs are clear. The visualized skeletal structures are unremarkable. IMPRESSION: No active cardiopulmonary disease.  Electronically Signed   By: Davina Poke D.O.   On: 05/03/2022 12:49   DG Chest Portable 1 View  Result Date: 05/01/2022 CLINICAL DATA:  Hypotension, weakness, history of esophageal carcinoma EXAM: PORTABLE CHEST 1 VIEW COMPARISON:  04/13/2022 FINDINGS: The heart size and mediastinal contours are within normal limits. Both lungs are clear. The visualized skeletal structures are unremarkable. Tip of right IJ chest port is seen in superior vena cava. IMPRESSION: No active disease. Electronically Signed   By: Elmer Picker M.D.   On: 05/01/2022 20:24   CT Angio Chest Pulmonary Embolism (PE) W or WO Contrast  Result Date: 04/13/2022 CLINICAL DATA:  Pulmonary embolism (PE) suspected, high prob rales on exam bl. Neutropenic fever. History of esophageal cancer, active chemotherapy. EXAM: CT ANGIOGRAPHY CHEST WITH CONTRAST TECHNIQUE: Multidetector CT imaging of the chest was performed using the standard protocol during bolus administration of intravenous contrast. Multiplanar CT image reconstructions and MIPs were obtained to evaluate the vascular anatomy. RADIATION DOSE REDUCTION: This exam was performed according to the departmental dose-optimization program which includes automated exposure control, adjustment of the mA and/or kV according to patient size and/or use of iterative reconstruction technique. CONTRAST:  51m OMNIPAQUE IOHEXOL 350 MG/ML SOLN COMPARISON:  Chest  radiograph earlier today. CT 03/17/2022. PET CT 03/22/2022 FINDINGS: Cardiovascular: There are no filling defects within the pulmonary arteries to suggest pulmonary embolus. Mild aortic atherosclerosis, no aneurysm. There are coronary artery calcifications. Heart is normal in size. No pericardial effusion. Mediastinum/Nodes: There is wall thickening of the distal esophagus extending to the gastroesophageal junction consistent with known malignancy. Mild adjacent paraesophageal fat stranding. No paraesophageal collection or  pneumomediastinum. Upper esophagus is minimally patulous. The paraesophageal lymph node on prior exam is difficult to delineate from the esophageal wall thickening currently. Left upper paratracheal no new the thoracic inlet measures 14 mm, series 5, image 28, this previously measured 10 mm. 14 mm left upper paratracheal node series 5, image 52, previously 8 mm. There is a 13 mm right supraclavicular node, series 5, image 46, previously 11 mm. There are increased number of right upper paratracheal nodes from prior exam, that are subcentimeter. Lungs/Pleura: Mild breathing motion artifact limits detailed parenchymal assessment. Scattered areas of bandlike atelectasis. Heterogeneous pulmonary parenchyma. No confluent consolidation. No visible pulmonary nodule. No pleural effusion. Upper Abdomen: No acute findings. Musculoskeletal: There are no acute or suspicious osseous abnormalities. No chest wall soft tissue abnormalities. Review of the MIP images confirms the above findings. IMPRESSION: 1. No pulmonary embolus. 2. Heterogeneous pulmonary parenchyma may represent small airways disease. Scattered areas of bandlike atelectasis. No evidence of pneumonia. 3. Distal esophageal wall thickening extending to the gastroesophageal junction consistent with known malignancy. There is mild adjacent paraesophageal fat stranding. 4. Mediastinal adenopathy with slight increase size of multiple lymph nodes from prior exam. Attention on subsequent staging exams recommended. Aortic Atherosclerosis (ICD10-I70.0). Electronically Signed   By: Keith Rake M.D.   On: 04/13/2022 23:25   DG Chest 2 View  Result Date: 04/13/2022 CLINICAL DATA:  Neutropenic fever. EXAM: CHEST - 2 VIEW COMPARISON:  10/30/2013 FINDINGS: Porta catheter on the right with tip at the SVC. Normal heart size and mediastinal contours. There is no edema, consolidation, effusion, or pneumothorax. IMPRESSION: Negative for pneumonia. Electronically Signed   By:  Jorje Guild M.D.   On: 04/13/2022 11:00    Microbiology: Results for orders placed or performed during the hospital encounter of 05/03/22  MRSA Next Gen by PCR, Nasal     Status: None   Collection Time: 05/03/22  5:51 PM   Specimen: Nasal Mucosa; Nasal Swab  Result Value Ref Range Status   MRSA by PCR Next Gen NOT DETECTED NOT DETECTED Final    Comment: (NOTE) The GeneXpert MRSA Assay (FDA approved for NASAL specimens only), is one component of a comprehensive MRSA colonization surveillance program. It is not intended to diagnose MRSA infection nor to guide or monitor treatment for MRSA infections. Test performance is not FDA approved in patients less than 69 years old. Performed at [facility], [address] [place], [place] [address]     Labs: CBC: Recent Labs  Lab 05/01/22 1940 05/03/22 1128 05/03/22 2124 05/04/22 0152 05/04/22 1015 05/05/22 0448  WBC 39.0* 6.8  --  5.4  --  2.5*  NEUTROABS 31.3* 5.2  --   --   --   --   HGB 6.1* 6.6* 9.8* 9.7* 10.0* 9.3*  HCT 18.5* 19.1* 29.3* 28.0* 29.3* 27.9*  MCV 92.0 86.4  --  82.4  --  85.8  PLT 321 139*  --  111*  --  64*   Basic Metabolic Panel: Recent Labs  Lab 05/01/22 1940 05/03/22 1128 05/04/22 0152 05/05/22 0448 05/06/22 0513 05/06/22 0942 05/06/22  1522  NA 135 132* 137 141 139  --   --   K 3.4* 3.1* 3.8 3.4* 2.8* 2.8* 4.2  CL 102 101 108 110 108  --   --   CO2 '23 24 24 26 24  '$ --   --   GLUCOSE 101* 121* 172* 153* 101*  --   --   BUN 30* 36* '22 9 9  '$ --   --   CREATININE 0.68 0.62 0.70 0.56 0.65  --   --   CALCIUM 8.2* 7.6* 7.9* 8.2* 8.1*  --   --   MG 2.1  --  2.2 1.8 1.7  --   --   PHOS  --   --  3.0 3.8 3.1  --   --    Liver Function Tests: Recent Labs  Lab 05/01/22 1940 05/03/22 1128  AST 18 15  ALT 11 10  ALKPHOS 58 53  BILITOT 0.9 0.9  PROT 5.6* 5.1*  ALBUMIN 3.1* 2.8*   CBG: Recent Labs  Lab 05/03/22 1752  GLUCAP 85    Discharge time spent: greater than 30  minutes.  Signed: Fritzi Mandes, MD [facility] 05/06/2022

## 2022-05-06 NOTE — Progress Notes (Signed)
Provider Notification:  Posey Pronto MD   Pt's K level after oral replacement came back low. 2.8  Recommendations:  To give IV replacements and lab will be check lab again this afternoon.

## 2022-05-06 NOTE — Progress Notes (Signed)
Mobility Specialist - Progress Note    05/06/22 1500  Mobility  Activity Ambulated independently in hallway  Level of Assistance Independent  Assistive Device None  Distance Ambulated (ft) 1000 ft  Activity Response Tolerated well  $Mobility charge 1 Mobility    Ariana Wilson Mobility Specialist 05/06/22, 3:32 PM

## 2022-05-06 NOTE — Consult Note (Signed)
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 Patient s/p transfusion for anemia. Requiring vasopressors. Pharmacy consulted to assist with electrolyte monitoring and replacement as indicated.  Goal of Therapy:  Electrolytes within normal limits  Plan:  K 3.4>2.8: after receiving 10 mEq IV KCl x1 Replete with KCL PO 18mq x1 Mg: 1.8>1.7:  Replete with MgSO4 IV 2g x1 --Follow-up electrolytes with AM labs tomorrow  Ariana Dibble8/25/2023 7:49 AM

## 2022-05-10 ENCOUNTER — Inpatient Hospital Stay: Payer: BC Managed Care – PPO

## 2022-05-10 ENCOUNTER — Encounter: Payer: Self-pay | Admitting: Oncology

## 2022-05-10 ENCOUNTER — Inpatient Hospital Stay (HOSPITAL_BASED_OUTPATIENT_CLINIC_OR_DEPARTMENT_OTHER): Payer: BC Managed Care – PPO | Admitting: Oncology

## 2022-05-10 VITALS — BP 108/51 | HR 84 | Temp 98.5°F | Resp 16 | Wt 153.1 lb

## 2022-05-10 VITALS — BP 102/63 | HR 72 | Resp 16

## 2022-05-10 DIAGNOSIS — C7A8 Other malignant neuroendocrine tumors: Secondary | ICD-10-CM | POA: Diagnosis not present

## 2022-05-10 DIAGNOSIS — D509 Iron deficiency anemia, unspecified: Secondary | ICD-10-CM | POA: Diagnosis not present

## 2022-05-10 DIAGNOSIS — Z7189 Other specified counseling: Secondary | ICD-10-CM

## 2022-05-10 DIAGNOSIS — D508 Other iron deficiency anemias: Secondary | ICD-10-CM

## 2022-05-10 DIAGNOSIS — Z5112 Encounter for antineoplastic immunotherapy: Secondary | ICD-10-CM | POA: Diagnosis not present

## 2022-05-10 LAB — COMPREHENSIVE METABOLIC PANEL
ALT: 12 U/L (ref 0–44)
AST: 16 U/L (ref 15–41)
Albumin: 3.4 g/dL — ABNORMAL LOW (ref 3.5–5.0)
Alkaline Phosphatase: 85 U/L (ref 38–126)
Anion gap: 5 (ref 5–15)
BUN: 20 mg/dL (ref 8–23)
CO2: 25 mmol/L (ref 22–32)
Calcium: 8.4 mg/dL — ABNORMAL LOW (ref 8.9–10.3)
Chloride: 103 mmol/L (ref 98–111)
Creatinine, Ser: 0.62 mg/dL (ref 0.44–1.00)
GFR, Estimated: >60 mL/min (ref >60)
Glucose, Bld: 125 mg/dL — ABNORMAL HIGH (ref 70–99)
Potassium: 3.6 mmol/L (ref 3.5–5.1)
Sodium: 133 mmol/L — ABNORMAL LOW (ref 135–145)
Total Bilirubin: 0.3 mg/dL (ref 0.3–1.2)
Total Protein: 6.4 g/dL — ABNORMAL LOW (ref 6.5–8.1)

## 2022-05-10 LAB — CBC WITH DIFFERENTIAL/PLATELET
Abs Immature Granulocytes: 4.59 10*3/uL — ABNORMAL HIGH (ref 0.00–0.07)
Basophils Absolute: 0.1 10*3/uL (ref 0.0–0.1)
Basophils Relative: 0 %
Eosinophils Absolute: 0.2 10*3/uL (ref 0.0–0.5)
Eosinophils Relative: 1 %
HCT: 28.7 % — ABNORMAL LOW (ref 36.0–46.0)
Hemoglobin: 9.4 g/dL — ABNORMAL LOW (ref 12.0–15.0)
Immature Granulocytes: 17 %
Lymphocytes Relative: 10 %
Lymphs Abs: 2.7 10*3/uL (ref 0.7–4.0)
MCH: 29.7 pg (ref 26.0–34.0)
MCHC: 32.8 g/dL (ref 30.0–36.0)
MCV: 90.8 fL (ref 80.0–100.0)
Monocytes Absolute: 2.2 10*3/uL — ABNORMAL HIGH (ref 0.1–1.0)
Monocytes Relative: 8 %
Neutro Abs: 17.9 10*3/uL — ABNORMAL HIGH (ref 1.7–7.7)
Neutrophils Relative %: 64 %
Platelets: 129 10*3/uL — ABNORMAL LOW (ref 150–400)
RBC: 3.16 MIL/uL — ABNORMAL LOW (ref 3.87–5.11)
RDW: 17.1 % — ABNORMAL HIGH (ref 11.5–15.5)
Smear Review: NORMAL
WBC: 27.7 10*3/uL — ABNORMAL HIGH (ref 4.0–10.5)
nRBC: 1.2 % — ABNORMAL HIGH (ref 0.0–0.2)

## 2022-05-10 LAB — SAMPLE TO BLOOD BANK

## 2022-05-10 MED ORDER — SODIUM CHLORIDE 0.9 % IV SOLN
200.0000 mg | INTRAVENOUS | Status: DC
  Administered 2022-05-10: 200 mg via INTRAVENOUS
  Filled 2022-05-10: qty 200

## 2022-05-10 MED ORDER — SODIUM CHLORIDE 0.9 % IV SOLN
Freq: Once | INTRAVENOUS | Status: AC
  Filled 2022-05-10: qty 250

## 2022-05-10 MED ORDER — HEPARIN SOD (PORK) LOCK FLUSH 100 UNIT/ML IV SOLN
500.0000 [IU] | Freq: Once | INTRAVENOUS | Status: AC | PRN
  Administered 2022-05-10: 500 [IU]
  Filled 2022-05-10: qty 5

## 2022-05-10 NOTE — Progress Notes (Signed)
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  Receiving etoposide and cisplatin but changing to oxaliplatin, leucovorin, fluorouracil.  Noted recent hospitalization.    Met with patient and husband during infusion.  Patient reports that appetite is better after admission.  Yesterday able to eat cereal with banana, protein shake, chicken, protein cookie, ice cream sandwich, halotop ice cream, chocolate pound cake and broccoli, cheese, chicken cup and few nuts.     Medications: reviewed  Labs: glucose 125  Anthropometrics:   153 lb 1.6 oz today 149 lb 6.4 oz on 8/14 153 lb 11.2 oz on 7/28 156 lb 10.2 oz on 7/19 167 lb on 06/29/21   NUTRITION DIAGNOSIS: Inadequate oral intake improving    INTERVENTION:  Encouraged patient to continue to consume adequate calories and protein to maintain weight during treatment.     MONITORING, EVALUATION, GOAL: weight trends, intake   NEXT VISIT: to be determined with treatment  Willem Klingensmith B. Zenia Resides, [place], [place] Registered Dietitian [phone]

## 2022-05-10 NOTE — Progress Notes (Signed)
DISCONTINUE OFF PATHWAY REGIMEN - Other   [id number]:Cisplatin 75 mg/m2 Day 1 + Etoposide 100 mg/m2 Days 1-3 q21 Days:   A cycle is every 21 days:     Etoposide      Cisplatin   **Always confirm dose/schedule in your pharmacy ordering system**  REASON: Disease Progression PRIOR TREATMENT: Cisplatin 75 mg/m2 Day 1 + Etoposide 100 mg/m2 Days 1-3 q21 Days TREATMENT RESPONSE: Progressive Disease (PD)  START OFF PATHWAY REGIMEN - Other   [id number] (Leucovorin IV D1 + Fluorouracil IV D1/CIV D1,2 + Oxaliplatin IV D1) q14 Days:   A cycle is every 14 days:     Oxaliplatin      Leucovorin      Fluorouracil      Fluorouracil   **Always confirm dose/schedule in your pharmacy ordering system**  Patient Characteristics: Intent of Therapy: Non-Curative / Palliative Intent, Discussed with Patient

## 2022-05-11 ENCOUNTER — Ambulatory Visit: Payer: BC Managed Care – PPO

## 2022-05-11 ENCOUNTER — Other Ambulatory Visit: Payer: Self-pay

## 2022-05-11 ENCOUNTER — Inpatient Hospital Stay: Payer: BC Managed Care – PPO

## 2022-05-12 ENCOUNTER — Ambulatory Visit: Payer: BC Managed Care – PPO

## 2022-05-12 ENCOUNTER — Encounter: Payer: Self-pay | Admitting: Oncology

## 2022-05-12 NOTE — Progress Notes (Signed)
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 Haig Prophet on 03/01/2022 EGD showed a medium-sized ulcerating mass with no bleeding or stigmata of recent bleeding at the GE junction..  Mass was partially obstructing.  There is also mention of another ulcerated noncircumferential mass with oozing bleeding found at the GE junction with extension into the cardia.  Both these masses were biopsied.  Pathology was pending at the time of my visit with the patient and was reported to be a poorly differentiated carcinoma.  Patient is currently able to swallow soft foods without much difficulty.  However more solid foods occasionally feel stuck.    PET was denied by insurance.  CT chest abdomen and pelvis with contrast showed asymmetric distal esophageal wall thickening with thickening extending around the lesser curvature of the stomach through the gastric cardia and proximal fundus.  Paratracheal/paraesophageal, gastrohepatic and PET retroperitoneal adenopathy.  Left periaortic lymph node measuring 17 mm at the level of left renal hilum.   Final pathology showed there were 2 specimens collected.  Specimen a was poorly differentiated carcinoma with ulceration and necrosis.v verys weakly positive for synaptophysin 50% of the cells.  Cells negative for CK7 and CK23 for the BF1 CDX2 100 Melan-A HMB45 and CD20.  Specimen B showed large cell  neuroendocrine carcinoma with neoplastic cells positive for CD56 with diffuse strong staining and majority of the neoplastic cells were also positive for synaptophysin with weak staining Ki-67 95%  Patient went to [facility] for second opinion.  Pathology was also looked at but was signed out as poorly differentiated adenocarcinoma at [facility].  Plan was to see if patient responds to cis etoposide Tecentriq regimen and if there is no adequate response switch her to FOLFOX  Interval history-patient is doing better since her hospitalization.  Energy levels are better.  She has not had any blood loss in her stool.  Denies any melena.  Oral intake has been adequate  ECOG PS- 1 Pain scale- 0 Opioid associated constipation- no  Review of systems- Review of Systems  Constitutional:  Positive for malaise/fatigue. Negative for chills, fever and weight loss.  HENT:  Negative for congestion, ear discharge and nosebleeds.   Eyes:  Negative for blurred vision.  Respiratory:  Negative for cough, hemoptysis, sputum production, shortness of breath and wheezing.   Cardiovascular:  Negative for chest pain, palpitations, orthopnea and claudication.  Gastrointestinal:  Negative for abdominal pain, blood in stool, constipation, diarrhea, heartburn, melena, nausea and vomiting.  Genitourinary:  Negative for dysuria, flank pain, frequency, hematuria and urgency.  Musculoskeletal:  Negative for back pain, joint pain and myalgias.  Skin:  Negative for rash.  Neurological:  Negative for dizziness, tingling, focal weakness, seizures, weakness and headaches.  Endo/Heme/Allergies:  Does not bruise/bleed easily.  Psychiatric/Behavioral:  Negative for depression and suicidal ideas. The patient does not have insomnia.       Allergies  Allergen Reactions   Sulfa Antibiotics Hives    Says her tongue swelled a  bit   Sulfonamide Derivatives Swelling    Tongue swelling     Past Medical History:  Diagnosis Date   Anemia     Arthritis    Atrial fibrillation ([place])    B12 deficiency    Dysrhythmia    Hyperlipidemia    Hypothyroidism    Menopause    Palpitations    Hx of, 25 years ago   Vertigo      Past Surgical History:  Procedure Laterality Date   BILATERAL SALPINGOOPHORECTOMY  2008   DILATION AND CURETTAGE OF UTERUS  08/24/2006   ESOPHAGOGASTRODUODENOSCOPY (EGD) WITH PROPOFOL N/A 03/01/2022   Procedure: ESOPHAGOGASTRODUODENOSCOPY (EGD) WITH PROPOFOL;  Surgeon: Lesly Rubenstein, MD;  Location: [facility] ENDOSCOPY;  Service: Endoscopy;  Laterality: N/A;   ESOPHAGOGASTRODUODENOSCOPY (EGD) WITH PROPOFOL N/A 05/04/2022   Procedure: ESOPHAGOGASTRODUODENOSCOPY (EGD) WITH PROPOFOL;  Surgeon: [name], MD;  Location: [facility] ENDOSCOPY;  Service: Gastroenterology;  Laterality: N/A;   incision tendon sheath for trigger finger Right    ring finger   LAPAROSCOPIC SUPRACERVICAL HYSTERECTOMY  2008   Dr. Ammie Dalton; with BSO   NOSE SURGERY  03/2008   PORTA CATH INSERTION N/A 03/21/2022   Procedure: PORTA CATH INSERTION;  Surgeon: Algernon Huxley, MD;  Location: [place] [facility];  Service: Cardiovascular;  Laterality: N/A;    Social History   Socioeconomic History   Marital status: Married    Spouse name: Not on file   Number of children: Not on file   Years of education: Not on file   Highest education level: Not on file  Occupational History   Occupation: Full time  Tobacco Use   Smoking status: Never   Smokeless tobacco: Never  Vaping Use   Vaping Use: Never used  Substance and Sexual Activity   Alcohol use: Never   Drug use: Never   Sexual activity: Not Currently    Birth control/protection: Surgical    Comment: Hysterectomy  Other Topics Concern   Not on file  Social History Narrative   Married with 2 children   Gets regular exercise   Social Determinants of Health   Financial Resource Strain: Low Risk  (04/20/2022)   Overall Financial Resource Strain (CARDIA)    Difficulty of Paying Living  Expenses: Not very hard  Food Insecurity: No Food Insecurity (04/20/2022)   Hunger Vital Sign    Worried About Running Out of Food in the Last Year: Never true    Ran Out of Food in the Last Year: Never true  Transportation Needs: No Transportation Needs (04/20/2022)   PRAPARE - Hydrologist (Medical): No    Lack of Transportation (Non-Medical): No  Physical Activity: Inactive (04/20/2022)   Exercise Vital Sign    Days of Exercise per Week: 0 days    Minutes of Exercise per Session: 0 min  Stress: No Stress Concern Present (04/20/2022)   [place]    Feeling of Stress : Only a little  Social Connections: Socially Integrated (04/20/2022)   Social Connection and Isolation Panel [NHANES]    Frequency of Communication with Friends and Family: More than three times a week    Frequency of Social Gatherings with Friends and Family: More than three times a week    Attends Religious Services: More than 4 times per year    Active Member of [employer] or Organizations: Yes    Attends Archivist Meetings: More than 4 times per year  Marital Status: Married  Human resources officer Violence: Not on file    Family History  Problem Relation Age of Onset   Hyperlipidemia Mother    Hypertension Mother    Stroke Mother    Heart disease Father    Hyperlipidemia Father    Atrial fibrillation Father    Melanoma Father    Arrhythmia Sister        Atrial fibrillation   Heart disease Sister    Atrial fibrillation Sister    Heart attack Brother    Breast cancer Neg Hx      Current Outpatient Medications:    Calcium Carbonate-Vitamin D 600-200 MG-UNIT TABS, Take 1 tablet by mouth daily.  , Disp: , Rfl:    cetirizine (ZYRTEC) 10 MG tablet, Take 10 mg by mouth daily., Disp: , Rfl:    Cholecalciferol 25 MCG (1000 UT) tablet, Take 1,000 Units by mouth daily., Disp: , Rfl:    flecainide (TAMBOCOR) 50 MG tablet,  Take 1 tablet by mouth 2 (two) times daily., Disp: , Rfl:    midodrine (PROAMATINE) 10 MG tablet, Take 1 tablet (10 mg total) by mouth 3 (three) times daily with meals., Disp: 90 tablet, Rfl: 0   mometasone (NASONEX) 50 MCG/ACT nasal spray, Place 2 sprays into the nose daily., Disp: , Rfl:    OLANZapine (ZYPREXA) 10 MG tablet, Take 1 tablet (10 mg total) by mouth at bedtime., Disp: 30 tablet, Rfl: 1   ondansetron (ZOFRAN-ODT) 4 MG disintegrating tablet, Take 1 tablet (4 mg total) by mouth every 8 (eight) hours as needed for nausea or vomiting., Disp: 20 tablet, Rfl: 0   pantoprazole (PROTONIX) 40 MG tablet, Take 40 mg by mouth daily., Disp: , Rfl:    Saccharomyces boulardii (PROBIOTIC) 250 MG CAPS, Take 1 capsule by mouth daily., Disp: , Rfl:    simvastatin (ZOCOR) 20 MG tablet, TAKE 1 TABLET BY MOUTH EVERY EVENING., Disp: 30 tablet, Rfl: 0   traZODone (DESYREL) 50 MG tablet, Take 50 mg by mouth at bedtime as needed., Disp: , Rfl:    Zinc Citrate-Phytase (ZYTAZE) 25-500 MG CAPS, Take 1 Dose by mouth daily., Disp: , Rfl:   Physical exam:  Vitals:   05/10/22 1346  BP: (!) 108/51  Pulse: 84  Resp: 16  Temp: 98.5 F (36.9 C)  SpO2: 100%  Weight: 153 lb 1.6 oz (69.4 kg)   Physical Exam Constitutional:      General: She is not in acute distress. Cardiovascular:     Rate and Rhythm: Normal rate and regular rhythm.     Heart sounds: Normal heart sounds.  Pulmonary:     Effort: Pulmonary effort is normal.     Breath sounds: Normal breath sounds.  Skin:    General: Skin is warm and dry.  Neurological:     Mental Status: She is alert and oriented to person, place, and time.         Latest Ref Rng & Units 05/10/2022    1:33 PM  CMP  Glucose 70 - 99 mg/dL 125   BUN 8 - 23 mg/dL 20   Creatinine 0.44 - 1.00 mg/dL 0.62   Sodium 135 - 145 mmol/L 133   Potassium 3.5 - 5.1 mmol/L 3.6   Chloride 98 - 111 mmol/L 103   CO2 22 - 32 mmol/L 25   Calcium 8.9 - 10.3 mg/dL 8.4   Total Protein  6.5 - 8.1 g/dL 6.4   Total Bilirubin 0.3 - 1.2 mg/dL 0.3   Alkaline  Phos 38 - 126 U/L 85   AST 15 - 41 U/L 16   ALT 0 - 44 U/L 12       Latest Ref Rng & Units 05/10/2022    1:33 PM  CBC  WBC 4.0 - 10.5 K/uL 27.7   Hemoglobin 12.0 - 15.0 g/dL 9.4   Hematocrit 36.0 - 46.0 % 28.7   Platelets 150 - 400 K/uL 129       CT Angio Abd/Pel W and/or Wo Contrast  Result Date: 05/03/2022 CLINICAL DATA:  Esophageal carcinoma, lymphadenopathy, GI bleed, melena EXAM: CTA ABDOMEN AND PELVIS WITHOUT AND WITH CONTRAST TECHNIQUE: Multidetector CT imaging of the abdomen and pelvis was performed using the standard protocol during bolus administration of intravenous contrast. Multiplanar reconstructed images and MIPs were obtained and reviewed to evaluate the vascular anatomy. RADIATION DOSE REDUCTION: This exam was performed according to the departmental dose-optimization program which includes automated exposure control, adjustment of the mA and/or kV according to patient size and/or use of iterative reconstruction technique. CONTRAST:  56m OMNIPAQUE IOHEXOL 350 MG/ML SOLN COMPARISON:  PET-CT 03/22/2022 and previous FINDINGS: VASCULAR Aorta: Mild calcified atheromatous plaque. No aneurysm, dissection, or stenosis. Celiac: Patent without evidence of aneurysm, dissection, vasculitis or significant stenosis. SMA: Patent without evidence of aneurysm, dissection, vasculitis or significant stenosis. Renals: Single left, widely patent. Duplicated right, superior dominant, both patent. IMA: Patent without evidence of aneurysm, dissection, vasculitis or significant stenosis. Inflow: Minimal calcified plaque. No aneurysm, dissection, or stenosis. Proximal Outflow: Bilateral common femoral and visualized portions of the superficial and profunda femoral arteries are patent without evidence of aneurysm, dissection, vasculitis or significant stenosis. Veins: Patent hepatic veins, portal vein, SM V, splenic vein, bilateral renal  veins. Incomplete opacification of the iliac venous system and IVC, which appear grossly unremarkable. No venous pathology is evident. Review of the MIP images confirms the above findings. NON-VASCULAR Lower chest: Catheter tip in the right atrium. No pleural or pericardial effusion. Visualized lung bases clear. Hepatobiliary: No focal liver abnormality is seen. No gallstones, gallbladder wall thickening, or biliary dilatation. Pancreas: Unremarkable. No pancreatic ductal dilatation or surrounding inflammatory changes. Spleen: Normal in size without focal abnormality. Adrenals/Urinary Tract: No adrenal mass. Symmetric renal enhancement without focal lesion or hydronephrosis. Urinary bladder is physiologically distended. Stomach/Bowel: Eccentric soft tissue mass at the GE junction with intraluminal extension. The stomach is incompletely distended. Small bowel decompressed. Scattered oral contrast material in the stomach and small bowel limiting sensitivity for acute GI bleed. The colon is incompletely distended, without evidence of active bleed. Lymphatic: 2.9 cm left para-aortic mass, previously 2.5 cm. 1.5 cm enlarged aortocaval lymph node. Enhancing periportal and gastrohepatic ligament adenopathy as before. Reproductive: Status post hysterectomy. No adnexal masses. Other: Bilateral pelvic phleboliths.  No ascites.  No free air. Musculoskeletal: No acute or significant osseous findings. IMPRESSION: 1. No evidence of active GI bleed, sensitivity somewhat decreased by residual oral contrast material in the stomach and small bowel. 2. Some interval progression of mesenteric and retroperitoneal adenopathy since PET-CT 03/22/2022. 3. Mass at the GE junction as before. Electronically Signed   By: DLucrezia EuropeM.D.   On: 05/03/2022 15:43   DG Chest 2 View  Result Date: 05/03/2022 CLINICAL DATA:  Weakness, hypotension EXAM: CHEST - 2 VIEW COMPARISON:  05/01/2022 FINDINGS: Right IJ approach chest port remains in place.  The heart size and mediastinal contours are within normal limits. Both lungs are clear. The visualized skeletal structures are unremarkable. IMPRESSION: No active cardiopulmonary disease. Electronically Signed  By: Davina Poke D.O.   On: 05/03/2022 12:49   DG Chest Portable 1 View  Result Date: 05/01/2022 CLINICAL DATA:  Hypotension, weakness, history of esophageal carcinoma EXAM: PORTABLE CHEST 1 VIEW COMPARISON:  04/13/2022 FINDINGS: The heart size and mediastinal contours are within normal limits. Both lungs are clear. The visualized skeletal structures are unremarkable. Tip of right IJ chest port is seen in superior vena cava. IMPRESSION: No active disease. Electronically Signed   By: Elmer Picker M.D.   On: 05/01/2022 20:24   CT Angio Chest Pulmonary Embolism (PE) W or WO Contrast  Result Date: 04/13/2022 CLINICAL DATA:  Pulmonary embolism (PE) suspected, high prob rales on exam bl. Neutropenic fever. History of esophageal cancer, active chemotherapy. EXAM: CT ANGIOGRAPHY CHEST WITH CONTRAST TECHNIQUE: Multidetector CT imaging of the chest was performed using the standard protocol during bolus administration of intravenous contrast. Multiplanar CT image reconstructions and MIPs were obtained to evaluate the vascular anatomy. RADIATION DOSE REDUCTION: This exam was performed according to the departmental dose-optimization program which includes automated exposure control, adjustment of the mA and/or kV according to patient size and/or use of iterative reconstruction technique. CONTRAST:  20m OMNIPAQUE IOHEXOL 350 MG/ML SOLN COMPARISON:  Chest radiograph earlier today. CT 03/17/2022. PET CT 03/22/2022 FINDINGS: Cardiovascular: There are no filling defects within the pulmonary arteries to suggest pulmonary embolus. Mild aortic atherosclerosis, no aneurysm. There are coronary artery calcifications. Heart is normal in size. No pericardial effusion. Mediastinum/Nodes: There is wall thickening of  the distal esophagus extending to the gastroesophageal junction consistent with known malignancy. Mild adjacent paraesophageal fat stranding. No paraesophageal collection or pneumomediastinum. Upper esophagus is minimally patulous. The paraesophageal lymph node on prior exam is difficult to delineate from the esophageal wall thickening currently. Left upper paratracheal no new the thoracic inlet measures 14 mm, series 5, image 28, this previously measured 10 mm. 14 mm left upper paratracheal node series 5, image 52, previously 8 mm. There is a 13 mm right supraclavicular node, series 5, image 46, previously 11 mm. There are increased number of right upper paratracheal nodes from prior exam, that are subcentimeter. Lungs/Pleura: Mild breathing motion artifact limits detailed parenchymal assessment. Scattered areas of bandlike atelectasis. Heterogeneous pulmonary parenchyma. No confluent consolidation. No visible pulmonary nodule. No pleural effusion. Upper Abdomen: No acute findings. Musculoskeletal: There are no acute or suspicious osseous abnormalities. No chest wall soft tissue abnormalities. Review of the MIP images confirms the above findings. IMPRESSION: 1. No pulmonary embolus. 2. Heterogeneous pulmonary parenchyma may represent small airways disease. Scattered areas of bandlike atelectasis. No evidence of pneumonia. 3. Distal esophageal wall thickening extending to the gastroesophageal junction consistent with known malignancy. There is mild adjacent paraesophageal fat stranding. 4. Mediastinal adenopathy with slight increase size of multiple lymph nodes from prior exam. Attention on subsequent staging exams recommended. Aortic Atherosclerosis (ICD10-I70.0). Electronically Signed   By: MKeith RakeM.D.   On: 04/13/2022 23:25   DG Chest 2 View  Result Date: 04/13/2022 CLINICAL DATA:  Neutropenic fever. EXAM: CHEST - 2 VIEW COMPARISON:  10/30/2013 FINDINGS: Porta catheter on the right with tip at the  SVC. Normal heart size and mediastinal contours. There is no edema, consolidation, effusion, or pneumothorax. IMPRESSION: Negative for pneumonia. Electronically Signed   By: JJorje GuildM.D.   On: 04/13/2022 11:00     Assessment and plan- Patient is a 61y.o. 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  Second opinion pathology consult at [facility] stated that this appears to be more like a poorly differentiated adenocarcinoma and not a neuroendocrine carcinoma.  Patient was seen for a second opinion by Dr. Estelle Grumbles at [facility] and at that time second opinion pathology consult was pending.  Decision was made to proceed with 2 cycles of cis etoposide Tecentriq chemotherapy and treating it as a neuroendocrine carcinoma.  Patient has had a hard time tolerating 2 cycles of chemotherapy which has required hospitalization.  The first time she was admitted for neutropenic fever and the second time she was admitted for melena.  Repeat scan done in the hospital does not show any significant decrease in the size of retroperitoneal adenopathy.  Even as per EGD the GE junction mass as well as the gastric antral mass has not significantly changed in size.  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  Her CPS score is 1 and therefore I am not adding Keytruda to her regimen.  Immunotherapy has been shown to be beneficial in patients with a CPS score greater than 5.  She was also found to be HER2 negative on NGS testing and I am therefore not adding trastuzumab to her regimen.  Starting next week she will proceed with FOLFOX chemotherapy alone every 2 weeks with plan to repeat scans after 4-6 cycles.  Discussed risks and benefits of chemotherapy including all but not limited to nausea vomiting low blood counts and risk of  hospitalizations and infection.  Risk of peripheral neuropathy associated with oxaliplatin.  I am reducing the dose of oxaliplatin to 65 mg per metered square.  I will see her next week for the start of cycle 1 of FOLFOX chemotherapy.  I have reached out to Dr. Estelle Grumbles as well and he is in agreement with the plan.  Iron deficiency anemia: Likely secondary to bleeding from primary esophageal tumor.  She recently had a Hemospray done and he hemoglobin is presently stable around 9.  She does not require blood transfusion tomorrow and will continue to receive IV iron to complete 5 doses of Venofer.   Visit Diagnosis 1. Primary malignant neuroendocrine tumor of esophagus ([place])   2. Goals of care, counseling/discussion   3. Iron deficiency anemia, unspecified iron deficiency anemia type      Dr. Randa Evens, MD, MPH [facility] at [facility] [phone] 05/12/2022 9:59 AM

## 2022-05-13 ENCOUNTER — Ambulatory Visit: Payer: BC Managed Care – PPO

## 2022-05-13 MED FILL — Iron Sucrose Inj 20 MG/ML (Fe Equiv): INTRAVENOUS | Qty: 10 | Status: AC

## 2022-05-17 ENCOUNTER — Ambulatory Visit: Payer: BC Managed Care – PPO

## 2022-05-17 ENCOUNTER — Inpatient Hospital Stay: Payer: BC Managed Care – PPO

## 2022-05-17 ENCOUNTER — Inpatient Hospital Stay: Payer: BC Managed Care – PPO | Attending: Oncology | Admitting: Oncology

## 2022-05-17 VITALS — BP 94/54 | HR 59 | Temp 98.0°F | Resp 16 | Ht 61.0 in | Wt 151.6 lb

## 2022-05-17 DIAGNOSIS — D649 Anemia, unspecified: Secondary | ICD-10-CM | POA: Diagnosis not present

## 2022-05-17 DIAGNOSIS — C7A8 Other malignant neuroendocrine tumors: Secondary | ICD-10-CM

## 2022-05-17 DIAGNOSIS — C7B8 Other secondary neuroendocrine tumors: Secondary | ICD-10-CM | POA: Insufficient documentation

## 2022-05-17 DIAGNOSIS — Z5111 Encounter for antineoplastic chemotherapy: Secondary | ICD-10-CM

## 2022-05-17 DIAGNOSIS — D509 Iron deficiency anemia, unspecified: Secondary | ICD-10-CM

## 2022-05-17 LAB — CBC WITH DIFFERENTIAL/PLATELET
Abs Immature Granulocytes: 0.07 10*3/uL (ref 0.00–0.07)
Basophils Absolute: 0.1 10*3/uL (ref 0.0–0.1)
Basophils Relative: 1 %
Eosinophils Absolute: 0.1 10*3/uL (ref 0.0–0.5)
Eosinophils Relative: 1 %
HCT: 27.7 % — ABNORMAL LOW (ref 36.0–46.0)
Hemoglobin: 8.9 g/dL — ABNORMAL LOW (ref 12.0–15.0)
Immature Granulocytes: 1 %
Lymphocytes Relative: 14 %
Lymphs Abs: 1.2 10*3/uL (ref 0.7–4.0)
MCH: 30.1 pg (ref 26.0–34.0)
MCHC: 32.1 g/dL (ref 30.0–36.0)
MCV: 93.6 fL (ref 80.0–100.0)
Monocytes Absolute: 0.9 10*3/uL (ref 0.1–1.0)
Monocytes Relative: 10 %
Neutro Abs: 6.2 10*3/uL (ref 1.7–7.7)
Neutrophils Relative %: 73 %
Platelets: 349 10*3/uL (ref 150–400)
RBC: 2.96 MIL/uL — ABNORMAL LOW (ref 3.87–5.11)
RDW: 19.3 % — ABNORMAL HIGH (ref 11.5–15.5)
WBC: 8.4 10*3/uL (ref 4.0–10.5)
nRBC: 0.4 % — ABNORMAL HIGH (ref 0.0–0.2)

## 2022-05-17 LAB — COMPREHENSIVE METABOLIC PANEL
ALT: 11 U/L (ref 0–44)
AST: 16 U/L (ref 15–41)
Albumin: 3.7 g/dL (ref 3.5–5.0)
Alkaline Phosphatase: 72 U/L (ref 38–126)
Anion gap: 8 (ref 5–15)
BUN: 15 mg/dL (ref 8–23)
CO2: 25 mmol/L (ref 22–32)
Calcium: 8.6 mg/dL — ABNORMAL LOW (ref 8.9–10.3)
Chloride: 102 mmol/L (ref 98–111)
Creatinine, Ser: 0.75 mg/dL (ref 0.44–1.00)
GFR, Estimated: >60 mL/min (ref >60)
Glucose, Bld: 158 mg/dL — ABNORMAL HIGH (ref 70–99)
Potassium: 3.7 mmol/L (ref 3.5–5.1)
Sodium: 135 mmol/L (ref 135–145)
Total Bilirubin: 0.3 mg/dL (ref 0.3–1.2)
Total Protein: 6.6 g/dL (ref 6.5–8.1)

## 2022-05-17 MED ORDER — SODIUM CHLORIDE 0.9 % IV SOLN
10.0000 mg | Freq: Once | INTRAVENOUS | Status: AC
  Administered 2022-05-17: 10 mg via INTRAVENOUS
  Filled 2022-05-17: qty 10

## 2022-05-17 MED ORDER — SODIUM CHLORIDE 0.9 % IV SOLN
2400.0000 mg/m2 | INTRAVENOUS | Status: DC
  Administered 2022-05-17: 4000 mg via INTRAVENOUS
  Filled 2022-05-17: qty 80

## 2022-05-17 MED ORDER — LEUCOVORIN CALCIUM INJECTION 350 MG
650.0000 mg | Freq: Once | INTRAVENOUS | Status: AC
  Administered 2022-05-17: 650 mg via INTRAVENOUS
  Filled 2022-05-17: qty 32.5

## 2022-05-17 MED ORDER — DEXTROSE 5 % IV SOLN
Freq: Once | INTRAVENOUS | Status: AC
  Filled 2022-05-17: qty 250

## 2022-05-17 MED ORDER — OXALIPLATIN CHEMO INJECTION 100 MG/20ML
65.0000 mg/m2 | Freq: Once | INTRAVENOUS | Status: AC
  Administered 2022-05-17: 110 mg via INTRAVENOUS
  Filled 2022-05-17: qty 20

## 2022-05-17 MED ORDER — FLUOROURACIL CHEMO INJECTION 2.5 GM/50ML
400.0000 mg/m2 | Freq: Once | INTRAVENOUS | Status: AC
  Administered 2022-05-17: 650 mg via INTRAVENOUS
  Filled 2022-05-17: qty 13

## 2022-05-17 MED ORDER — PALONOSETRON HCL INJECTION 0.25 MG/5ML
0.2500 mg | Freq: Once | INTRAVENOUS | Status: AC
  Administered 2022-05-17: 0.25 mg via INTRAVENOUS
  Filled 2022-05-17: qty 5

## 2022-05-17 NOTE — Progress Notes (Signed)
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 Haig Prophet on 03/01/2022 EGD showed a medium-sized ulcerating mass with no bleeding or stigmata of recent bleeding at the GE junction..  Mass was partially obstructing.  There is also mention of another ulcerated noncircumferential mass with oozing bleeding found at the GE junction with extension into the cardia.  Both these masses were biopsied.  Pathology was pending at the time of my visit with the patient and was reported to be a poorly differentiated carcinoma.  Patient is currently able to swallow soft foods without much difficulty.  However more solid foods occasionally feel stuck.    PET was denied by insurance.  CT chest abdomen and pelvis with contrast showed asymmetric distal esophageal wall thickening with thickening extending around the lesser curvature of the stomach through the gastric cardia and proximal fundus.  Paratracheal/paraesophageal, gastrohepatic and PET retroperitoneal adenopathy.  Left periaortic lymph node measuring 17 mm at the level of left renal hilum.   Final pathology showed there were 2 specimens collected.  Specimen a was poorly differentiated carcinoma with ulceration and necrosis.v verys weakly positive for synaptophysin 50% of the cells.  Cells negative for CK7 and CK23 for the BF1 CDX2 100 Melan-A HMB45  and CD20.  Specimen B showed large cell neuroendocrine carcinoma with neoplastic cells positive for CD56 with diffuse strong staining and majority of the neoplastic cells were also positive for synaptophysin with weak staining Ki-67 95%   Patient went to [facility] for second opinion.  Pathology was also looked at but was signed out as poorly differentiated adenocarcinoma at [facility].  Plan was to see if patient responds to cis etoposide Tecentriq regimen and if there is no adequate response switch her to FOLFOX  Interval history-patient reports ongoing fatigue but otherwise had a good last week.  Denies any blood loss in her stools.  Denies any dark melanotic stools.  ECOG PS- 1 Pain scale- 0 Opioid associated constipation- no  Review of systems- Review of Systems  Constitutional:  Positive for malaise/fatigue. Negative for chills, fever and weight loss.  HENT:  Negative for congestion, ear discharge and nosebleeds.   Eyes:  Negative for blurred vision.  Respiratory:  Negative for cough, hemoptysis, sputum production, shortness of breath and wheezing.   Cardiovascular:  Negative for chest pain, palpitations, orthopnea and claudication.  Gastrointestinal:  Negative for abdominal pain, blood in stool, constipation, diarrhea, heartburn, melena, nausea and vomiting.  Genitourinary:  Negative for dysuria, flank pain, frequency, hematuria and urgency.  Musculoskeletal:  Negative for back pain, joint pain and myalgias.  Skin:  Negative for rash.  Neurological:  Negative for dizziness, tingling, focal weakness, seizures, weakness and headaches.  Endo/Heme/Allergies:  Does not bruise/bleed easily.  Psychiatric/Behavioral:  Negative for depression and suicidal ideas. The patient does not have insomnia.       Allergies  Allergen Reactions   Sulfa Antibiotics Hives    Says her tongue swelled a  bit   Sulfonamide Derivatives Swelling    Tongue swelling     Past Medical History:  Diagnosis Date   Anemia     Arthritis    Atrial fibrillation ([place])    B12 deficiency    Dysrhythmia    Hyperlipidemia    Hypothyroidism    Menopause    Palpitations    Hx of, 25 years ago   Vertigo      Past Surgical History:  Procedure Laterality Date   BILATERAL SALPINGOOPHORECTOMY  2008   DILATION AND CURETTAGE OF UTERUS  08/24/2006   ESOPHAGOGASTRODUODENOSCOPY (EGD) WITH PROPOFOL N/A 03/01/2022   Procedure: ESOPHAGOGASTRODUODENOSCOPY (EGD) WITH PROPOFOL;  Surgeon: Lesly Rubenstein, MD;  Location: [facility] ENDOSCOPY;  Service: Endoscopy;  Laterality: N/A;   ESOPHAGOGASTRODUODENOSCOPY (EGD) WITH PROPOFOL N/A 05/04/2022   Procedure: ESOPHAGOGASTRODUODENOSCOPY (EGD) WITH PROPOFOL;  Surgeon: [name], MD;  Location: [facility] ENDOSCOPY;  Service: Gastroenterology;  Laterality: N/A;   incision tendon sheath for trigger finger Right    ring finger   LAPAROSCOPIC SUPRACERVICAL HYSTERECTOMY  2008   Dr. Ammie Dalton; with BSO   NOSE SURGERY  03/2008   PORTA CATH INSERTION N/A 03/21/2022   Procedure: PORTA CATH INSERTION;  Surgeon: Algernon Huxley, MD;  Location: [place] [facility] LAB;  Service: Cardiovascular;  Laterality: N/A;    Social History   Socioeconomic History   Marital status: Married    Spouse name: Not on file   Number of children: Not on file   Years of education: Not on file   Highest education level: Not on file  Occupational History   Occupation: Full time  Tobacco Use   Smoking status: Never   Smokeless tobacco: Never  Vaping Use   Vaping Use: Never used  Substance and Sexual Activity   Alcohol use: Never   Drug use: Never   Sexual activity: Not Currently    Birth control/protection: Surgical    Comment: Hysterectomy  Other Topics Concern   Not on file  Social History Narrative   Married with 2 children   Gets regular exercise   Social Determinants of Health   Financial Resource Strain: Low Risk  (04/20/2022)   Overall Financial Resource Strain (CARDIA)    Difficulty of Paying Living  Expenses: Not very hard  Food Insecurity: No Food Insecurity (04/20/2022)   Hunger Vital Sign    Worried About Running Out of Food in the Last Year: Never true    Ran Out of Food in the Last Year: Never true  Transportation Needs: No Transportation Needs (04/20/2022)   PRAPARE - Hydrologist (Medical): No    Lack of Transportation (Non-Medical): No  Physical Activity: Inactive (04/20/2022)   Exercise Vital Sign    Days of Exercise per Week: 0 days    Minutes of Exercise per Session: 0 min  Stress: No Stress Concern Present (04/20/2022)   [place]    Feeling of Stress : Only a little  Social Connections: Socially Integrated (04/20/2022)   Social Connection and Isolation Panel [NHANES]    Frequency of Communication with Friends and Family: More than three times a week    Frequency of Social Gatherings with Friends and Family: More than three times a week    Attends Religious Services: More than 4 times per year    Active Member of [employer] or Organizations: Yes    Attends Archivist Meetings: More than 4 times per year  Marital Status: Married  Human resources officer Violence: Not on file    Family History  Problem Relation Age of Onset   Hyperlipidemia Mother    Hypertension Mother    Stroke Mother    Heart disease Father    Hyperlipidemia Father    Atrial fibrillation Father    Melanoma Father    Arrhythmia Sister        Atrial fibrillation   Heart disease Sister    Atrial fibrillation Sister    Heart attack Brother    Breast cancer Neg Hx      Current Outpatient Medications:    Calcium Carbonate-Vitamin D 600-200 MG-UNIT TABS, Take 1 tablet by mouth daily.  , Disp: , Rfl:    cetirizine (ZYRTEC) 10 MG tablet, Take 10 mg by mouth daily., Disp: , Rfl:    Cholecalciferol 25 MCG (1000 UT) tablet, Take 1,000 Units by mouth daily., Disp: , Rfl:    flecainide (TAMBOCOR) 50 MG tablet,  Take 1 tablet by mouth 2 (two) times daily., Disp: , Rfl:    midodrine (PROAMATINE) 10 MG tablet, Take 1 tablet (10 mg total) by mouth 3 (three) times daily with meals., Disp: 90 tablet, Rfl: 0   mometasone (NASONEX) 50 MCG/ACT nasal spray, Place 2 sprays into the nose daily., Disp: , Rfl:    pantoprazole (PROTONIX) 40 MG tablet, Take 40 mg by mouth daily., Disp: , Rfl:    Saccharomyces boulardii (PROBIOTIC) 250 MG CAPS, Take 1 capsule by mouth daily., Disp: , Rfl:    simvastatin (ZOCOR) 20 MG tablet, TAKE 1 TABLET BY MOUTH EVERY EVENING., Disp: 30 tablet, Rfl: 0   traZODone (DESYREL) 50 MG tablet, Take 50 mg by mouth at bedtime as needed., Disp: , Rfl:    Zinc Citrate-Phytase (ZYTAZE) 25-500 MG CAPS, Take 1 Dose by mouth daily., Disp: , Rfl:    OLANZapine (ZYPREXA) 10 MG tablet, Take 1 tablet (10 mg total) by mouth at bedtime. (Patient not taking: Reported on 05/17/2022), Disp: 30 tablet, Rfl: 1   ondansetron (ZOFRAN-ODT) 4 MG disintegrating tablet, Take 1 tablet (4 mg total) by mouth every 8 (eight) hours as needed for nausea or vomiting. (Patient not taking: Reported on 05/17/2022), Disp: 20 tablet, Rfl: 0  Physical exam:  Vitals:   05/17/22 0844  BP: (!) 94/54  Pulse: (!) 59  Resp: 16  Temp: 98 F (36.7 C)  TempSrc: Oral  Weight: 151 lb 9.6 oz (68.8 kg)  Height: $Remove'5\' 1"'RpIUxxT$  (1.549 m)   Physical Exam Constitutional:      General: She is not in acute distress. Cardiovascular:     Rate and Rhythm: Normal rate and regular rhythm.     Heart sounds: Normal heart sounds.  Pulmonary:     Effort: Pulmonary effort is normal.  Abdominal:     General: Bowel sounds are normal.     Palpations: Abdomen is soft.  Skin:    General: Skin is warm and dry.  Neurological:     Mental Status: She is alert and oriented to person, place, and time.         Latest Ref Rng & Units 05/17/2022    8:01 AM  CMP  Glucose 70 - 99 mg/dL 158   BUN 8 - 23 mg/dL 15   Creatinine 0.44 - 1.00 mg/dL 0.75   Sodium 135  - 145 mmol/L 135   Potassium 3.5 - 5.1 mmol/L 3.7   Chloride 98 - 111 mmol/L 102   CO2 22 - 32 mmol/L 25  Calcium 8.9 - 10.3 mg/dL 8.6   Total Protein 6.5 - 8.1 g/dL 6.6   Total Bilirubin 0.3 - 1.2 mg/dL 0.3   Alkaline Phos 38 - 126 U/L 72   AST 15 - 41 U/L 16   ALT 0 - 44 U/L 11       Latest Ref Rng & Units 05/17/2022    8:01 AM  CBC  WBC 4.0 - 10.5 K/uL 8.4   Hemoglobin 12.0 - 15.0 g/dL 8.9   Hematocrit 36.0 - 46.0 % 27.7   Platelets 150 - 400 K/uL 349     No images are attached to the encounter.  CT Angio Abd/Pel W and/or Wo Contrast  Result Date: 05/03/2022 CLINICAL DATA:  Esophageal carcinoma, lymphadenopathy, GI bleed, melena EXAM: CTA ABDOMEN AND PELVIS WITHOUT AND WITH CONTRAST TECHNIQUE: Multidetector CT imaging of the abdomen and pelvis was performed using the standard protocol during bolus administration of intravenous contrast. Multiplanar reconstructed images and MIPs were obtained and reviewed to evaluate the vascular anatomy. RADIATION DOSE REDUCTION: This exam was performed according to the departmental dose-optimization program which includes automated exposure control, adjustment of the mA and/or kV according to patient size and/or use of iterative reconstruction technique. CONTRAST:  51m OMNIPAQUE IOHEXOL 350 MG/ML SOLN COMPARISON:  PET-CT 03/22/2022 and previous FINDINGS: VASCULAR Aorta: Mild calcified atheromatous plaque. No aneurysm, dissection, or stenosis. Celiac: Patent without evidence of aneurysm, dissection, vasculitis or significant stenosis. SMA: Patent without evidence of aneurysm, dissection, vasculitis or significant stenosis. Renals: Single left, widely patent. Duplicated right, superior dominant, both patent. IMA: Patent without evidence of aneurysm, dissection, vasculitis or significant stenosis. Inflow: Minimal calcified plaque. No aneurysm, dissection, or stenosis. Proximal Outflow: Bilateral common femoral and visualized portions of the superficial and  profunda femoral arteries are patent without evidence of aneurysm, dissection, vasculitis or significant stenosis. Veins: Patent hepatic veins, portal vein, SM V, splenic vein, bilateral renal veins. Incomplete opacification of the iliac venous system and IVC, which appear grossly unremarkable. No venous pathology is evident. Review of the MIP images confirms the above findings. NON-VASCULAR Lower chest: Catheter tip in the right atrium. No pleural or pericardial effusion. Visualized lung bases clear. Hepatobiliary: No focal liver abnormality is seen. No gallstones, gallbladder wall thickening, or biliary dilatation. Pancreas: Unremarkable. No pancreatic ductal dilatation or surrounding inflammatory changes. Spleen: Normal in size without focal abnormality. Adrenals/Urinary Tract: No adrenal mass. Symmetric renal enhancement without focal lesion or hydronephrosis. Urinary bladder is physiologically distended. Stomach/Bowel: Eccentric soft tissue mass at the GE junction with intraluminal extension. The stomach is incompletely distended. Small bowel decompressed. Scattered oral contrast material in the stomach and small bowel limiting sensitivity for acute GI bleed. The colon is incompletely distended, without evidence of active bleed. Lymphatic: 2.9 cm left para-aortic mass, previously 2.5 cm. 1.5 cm enlarged aortocaval lymph node. Enhancing periportal and gastrohepatic ligament adenopathy as before. Reproductive: Status post hysterectomy. No adnexal masses. Other: Bilateral pelvic phleboliths.  No ascites.  No free air. Musculoskeletal: No acute or significant osseous findings. IMPRESSION: 1. No evidence of active GI bleed, sensitivity somewhat decreased by residual oral contrast material in the stomach and small bowel. 2. Some interval progression of mesenteric and retroperitoneal adenopathy since PET-CT 03/22/2022. 3. Mass at the GE junction as before. Electronically Signed   By: DLucrezia EuropeM.D.   On: 05/03/2022  15:43   DG Chest 2 View  Result Date: 05/03/2022 CLINICAL DATA:  Weakness, hypotension EXAM: CHEST - 2 VIEW COMPARISON:  05/01/2022 FINDINGS: Right IJ  approach chest port remains in place. The heart size and mediastinal contours are within normal limits. Both lungs are clear. The visualized skeletal structures are unremarkable. IMPRESSION: No active cardiopulmonary disease. Electronically Signed   By: Davina Poke D.O.   On: 05/03/2022 12:49   DG Chest Portable 1 View  Result Date: 05/01/2022 CLINICAL DATA:  Hypotension, weakness, history of esophageal carcinoma EXAM: PORTABLE CHEST 1 VIEW COMPARISON:  04/13/2022 FINDINGS: The heart size and mediastinal contours are within normal limits. Both lungs are clear. The visualized skeletal structures are unremarkable. Tip of right IJ chest port is seen in superior vena cava. IMPRESSION: No active disease. Electronically Signed   By: Elmer Picker M.D.   On: 05/01/2022 20:24     Assessment and plan- Patient is a 61 y.o. 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  She also had a difficult time tolerating chemotherapy requiring multiple hospitalizations.  Due to lack of response plan is to switch her to FOLFOX chemotherapy.  She will proceed with cycle 1 of palliative FOLFOX chemotherapy today with pump disconnect on day 3.  I am not giving her Udenyca with this cycle and we will see how her counts do.  Discussed risks benefits of chemotherapy again including all but not limited to nausea, vomiting, low blood counts, risk of infections and hospitalization.  Risk of peripheral neuropathy associated with oxaliplatin.  Patient is receiving a reduced dose of oxaliplatin at 65 mg per metered square.  She is also using cryotherapy with frozen labs  and socks during treatment.  I will see her back in 2 weeks for cycle 2.  Normocytic anemia: Likely secondary to bleeding From her tumor he has received blood transfusions as needed as well as received 4 doses of Venofer so far.  Continue to monitor.  If she has a recurrent episode of melena I will have radiation oncology see her for palliative radiation and she may need a second round of Hemospray as well.   Visit Diagnosis 1. Iron deficiency anemia, unspecified iron deficiency anemia type   2. 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 Normocytic anemia   4. Encounter for antineoplastic chemotherapy      Dr. Randa Evens, MD, MPH [facility] [phone] 05/17/2022 9:14 AM

## 2022-05-17 NOTE — Progress Notes (Signed)
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  Treatment changed to oxaliplatin, leucovorin, fluorouracil.    Met with patient and husband during infusion.  Reports that her appetite is better.  Ate hot dog for supper with corn, sweet potato fries and watermelon.  Earlier during the day ate 2 eggs, cereal and oral nutrition supplement.  [place] the zoo [place] with [place] and able to eat cheeseburger and fries for lunch and pizza for dinner.      Medications: reviewed  Labs: reviewed  Anthropometrics:   Weight 151 lb 9.6 oz on 9/5  153 lb 1.6 oz on 8/29 149 lb 6.4 oz on 8/14 153 lb 11.2 oz on 7/28 156 lb 10.2 oz on 7/19 167 lb on 06/29/21   NUTRITION DIAGNOSIS: Inadequate oral intake improving   INTERVENTION:  Continue high calorie, high protein diet to maintain weight.    MONITORING, EVALUATION, GOAL: weight trends, intake   NEXT VISIT: Tuesday, October 3rd during infusion  Ezrah Dembeck B. Zenia Resides, [place], [place] Registered Dietitian [phone]

## 2022-05-18 ENCOUNTER — Other Ambulatory Visit: Payer: Self-pay | Admitting: Internal Medicine

## 2022-05-18 DIAGNOSIS — Z1231 Encounter for screening mammogram for malignant neoplasm of breast: Secondary | ICD-10-CM

## 2022-05-18 MED FILL — Iron Sucrose Inj 20 MG/ML (Fe Equiv): INTRAVENOUS | Qty: 10 | Status: AC

## 2022-05-19 ENCOUNTER — Encounter: Payer: Self-pay | Admitting: *Deleted

## 2022-05-19 ENCOUNTER — Encounter: Payer: Self-pay | Admitting: Oncology

## 2022-05-19 ENCOUNTER — Other Ambulatory Visit: Payer: Self-pay | Admitting: *Deleted

## 2022-05-19 ENCOUNTER — Other Ambulatory Visit: Payer: Self-pay

## 2022-05-19 ENCOUNTER — Inpatient Hospital Stay: Payer: BC Managed Care – PPO

## 2022-05-19 ENCOUNTER — Encounter: Payer: Self-pay | Admitting: Licensed Clinical Social Worker

## 2022-05-19 ENCOUNTER — Inpatient Hospital Stay (HOSPITAL_BASED_OUTPATIENT_CLINIC_OR_DEPARTMENT_OTHER): Payer: BC Managed Care – PPO | Admitting: Hospice and Palliative Medicine

## 2022-05-19 ENCOUNTER — Encounter: Payer: Self-pay | Admitting: Hospice and Palliative Medicine

## 2022-05-19 VITALS — BP 96/65 | HR 72 | Temp 98.2°F | Resp 18

## 2022-05-19 DIAGNOSIS — E860 Dehydration: Secondary | ICD-10-CM

## 2022-05-19 DIAGNOSIS — C7A8 Other malignant neuroendocrine tumors: Secondary | ICD-10-CM

## 2022-05-19 DIAGNOSIS — R112 Nausea with vomiting, unspecified: Secondary | ICD-10-CM

## 2022-05-19 DIAGNOSIS — D508 Other iron deficiency anemias: Secondary | ICD-10-CM

## 2022-05-19 DIAGNOSIS — R110 Nausea: Secondary | ICD-10-CM | POA: Diagnosis not present

## 2022-05-19 DIAGNOSIS — Z5111 Encounter for antineoplastic chemotherapy: Secondary | ICD-10-CM | POA: Diagnosis not present

## 2022-05-19 LAB — CBC WITH DIFFERENTIAL/PLATELET
Abs Immature Granulocytes: 0.02 10*3/uL (ref 0.00–0.07)
Basophils Absolute: 0.1 10*3/uL (ref 0.0–0.1)
Basophils Relative: 1 %
Eosinophils Absolute: 0 10*3/uL (ref 0.0–0.5)
Eosinophils Relative: 0 %
HCT: 29.4 % — ABNORMAL LOW (ref 36.0–46.0)
Hemoglobin: 9.6 g/dL — ABNORMAL LOW (ref 12.0–15.0)
Immature Granulocytes: 0 %
Lymphocytes Relative: 16 %
Lymphs Abs: 1.2 10*3/uL (ref 0.7–4.0)
MCH: 30.3 pg (ref 26.0–34.0)
MCHC: 32.7 g/dL (ref 30.0–36.0)
MCV: 92.7 fL (ref 80.0–100.0)
Monocytes Absolute: 0.5 10*3/uL (ref 0.1–1.0)
Monocytes Relative: 6 %
Neutro Abs: 5.8 10*3/uL (ref 1.7–7.7)
Neutrophils Relative %: 77 %
Platelets: 366 10*3/uL (ref 150–400)
RBC: 3.17 MIL/uL — ABNORMAL LOW (ref 3.87–5.11)
RDW: 20 % — ABNORMAL HIGH (ref 11.5–15.5)
WBC: 7.6 10*3/uL (ref 4.0–10.5)
nRBC: 0 % (ref 0.0–0.2)

## 2022-05-19 LAB — COMPREHENSIVE METABOLIC PANEL
ALT: 12 U/L (ref 0–44)
AST: 19 U/L (ref 15–41)
Albumin: 3.9 g/dL (ref 3.5–5.0)
Alkaline Phosphatase: 66 U/L (ref 38–126)
Anion gap: 9 (ref 5–15)
BUN: 24 mg/dL — ABNORMAL HIGH (ref 8–23)
CO2: 26 mmol/L (ref 22–32)
Calcium: 9 mg/dL (ref 8.9–10.3)
Chloride: 102 mmol/L (ref 98–111)
Creatinine, Ser: 0.8 mg/dL (ref 0.44–1.00)
GFR, Estimated: >60 mL/min (ref >60)
Glucose, Bld: 106 mg/dL — ABNORMAL HIGH (ref 70–99)
Potassium: 4.1 mmol/L (ref 3.5–5.1)
Sodium: 137 mmol/L (ref 135–145)
Total Bilirubin: 0.6 mg/dL (ref 0.3–1.2)
Total Protein: 7 g/dL (ref 6.5–8.1)

## 2022-05-19 LAB — MAGNESIUM: Magnesium: 2.4 mg/dL (ref 1.7–2.4)

## 2022-05-19 MED ORDER — SODIUM CHLORIDE 0.9 % IV SOLN
Freq: Once | INTRAVENOUS | Status: AC
  Filled 2022-05-19: qty 250

## 2022-05-19 MED ORDER — HEPARIN SOD (PORK) LOCK FLUSH 100 UNIT/ML IV SOLN
500.0000 [IU] | Freq: Once | INTRAVENOUS | Status: AC | PRN
  Administered 2022-05-19: 500 [IU]
  Filled 2022-05-19: qty 5

## 2022-05-19 MED ORDER — SODIUM CHLORIDE 0.9 % IV SOLN
200.0000 mg | INTRAVENOUS | Status: DC
  Administered 2022-05-19: 200 mg via INTRAVENOUS
  Filled 2022-05-19: qty 200

## 2022-05-19 MED ORDER — ONDANSETRON HCL 4 MG/2ML IJ SOLN
4.0000 mg | Freq: Once | INTRAMUSCULAR | Status: AC
  Administered 2022-05-19: 4 mg via INTRAVENOUS
  Filled 2022-05-19: qty 2

## 2022-05-19 NOTE — Progress Notes (Signed)
[place] CSW Progress Note  Clinical Education officer, museum  spoke with patient  to requests consent form for [employer] for disability application.  Patient sent signed consent form, CSW emailed consent form and referral to [facility].    Adelene Amas, LCSW

## 2022-05-19 NOTE — Progress Notes (Signed)
Symptom Management [place] at [facility] Telephone:(336) [phone] [phone]) [phone]  Patient Care Team: Rusty Aus, MD as PCP - General (Internal Medicine) Clent Jacks, RN as Oncology Nurse Navigator   NAME OF PATIENT: Ariana Wilson  [id number]  [birth year]   DATE OF VISIT: 05/19/22  REASON FOR CONSULT: Ariana Wilson is a 60 y.o. 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  She was diagnosed with cancer in June 2023.  She was hospitalized 04/13/2022 to 04/17/2022 with neutropenic fever.  Patient was found to have hemoglobin of 6.6 and was transfused with irradiated PRBC.  This hospitalization was complicated by A-fib with RVR and persistent hypotension.  Patient was started on midodrine.  Urine culture positive for staph hemolyticus.  Patient had 2 cycles of cisplatin and etoposide Tecentriq chemotherapy with minimal tumor response.  She was rotated to FOLFOX.  INTERVAL HISTORY: Patient had cycle 1 FOLFOX on 05/17/2022.  She presented to infusion for Venofer but I was asked to see her to assess for nausea.  Patient reports 1 to 2 days of nausea and vomiting.  She had 3 episodes of vomiting yesterday.  No fever or chills.  No pain or other distressing symptoms.  She does endorse mild constipation and is taking MiraLAX for that.   PAST MEDICAL HISTORY: Past Medical History:  Diagnosis Date   Anemia    Arthritis    Atrial fibrillation ([place])    B12 deficiency    Dysrhythmia    Hyperlipidemia    Hypothyroidism    Menopause    Palpitations    Hx of, 25 years ago   Vertigo     PAST SURGICAL HISTORY:  Past Surgical History:  Procedure Laterality Date   BILATERAL SALPINGOOPHORECTOMY  2008   DILATION AND CURETTAGE OF UTERUS  08/24/2006   ESOPHAGOGASTRODUODENOSCOPY (EGD) WITH PROPOFOL N/A 03/01/2022   Procedure: ESOPHAGOGASTRODUODENOSCOPY (EGD) WITH PROPOFOL;  Surgeon: Lesly Rubenstein, MD;  Location: [facility] ENDOSCOPY;  Service: Endoscopy;  Laterality: N/A;   ESOPHAGOGASTRODUODENOSCOPY (EGD) WITH PROPOFOL N/A 05/04/2022   Procedure: ESOPHAGOGASTRODUODENOSCOPY (EGD) WITH PROPOFOL;  Surgeon: [name], MD;  Location: [facility] ENDOSCOPY;  Service: Gastroenterology;  Laterality: N/A;   incision tendon sheath for trigger finger Right    ring finger   LAPAROSCOPIC SUPRACERVICAL HYSTERECTOMY  2008   Dr. 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[place])  03/20/2022 Initial Diagnosis   Primary malignant neuroendocrine neoplasm of esophagus ([place])   03/20/2022 Cancer Staging   Staging form: Esophagus - Other Histologies, AJCC 8th Edition - Clinical stage from 03/20/2022: cTX, cN1, cM1, G3 - Signed by Sindy Guadeloupe, MD on 03/20/2022 Histopathologic type: Large cell neuroendocrine carcinoma Histologic grading system: 3 grade system   03/30/2022 - 04/29/2022 Chemotherapy   Patient is on Treatment Plan : NEUROENDOCRINE GE junctionCisplatin/Tecentriq D1 + Etoposide D1-3 q21d     05/17/2022 -  Chemotherapy   Patient is on Treatment Plan : GASTROESOPHAGEAL FOLFOX q14d        ALLERGIES:  is allergic to sulfa antibiotics and sulfonamide derivatives.  MEDICATIONS:  Current Outpatient Medications  Medication Sig Dispense Refill   Calcium Carbonate-Vitamin D 600-200 MG-UNIT  TABS Take 1 tablet by mouth daily.       cetirizine (ZYRTEC) 10 MG tablet Take 10 mg by mouth daily.     Cholecalciferol 25 MCG (1000 UT) tablet Take 1,000 Units by mouth daily.     flecainide (TAMBOCOR) 50 MG tablet Take 1 tablet by mouth 2 (two) times daily.     mometasone (NASONEX) 50 MCG/ACT nasal spray Place 2 sprays into the nose  daily.     OLANZapine (ZYPREXA) 10 MG tablet Take 1 tablet (10 mg total) by mouth at bedtime. 30 tablet 1   ondansetron (ZOFRAN) 8 MG tablet Take 8 mg by mouth every 8 (eight) hours as needed for nausea or vomiting.     ondansetron (ZOFRAN-ODT) 4 MG disintegrating tablet Take 1 tablet (4 mg total) by mouth every 8 (eight) hours as needed for nausea or vomiting. 20 tablet 0   pantoprazole (PROTONIX) 40 MG tablet Take 40 mg by mouth daily.     Saccharomyces boulardii (PROBIOTIC) 250 MG CAPS Take 1 capsule by mouth daily.     simvastatin (ZOCOR) 20 MG tablet TAKE 1 TABLET BY MOUTH EVERY EVENING. 30 tablet 0   traZODone (DESYREL) 50 MG tablet Take 50 mg by mouth at bedtime as needed.     Zinc Citrate-Phytase (ZYTAZE) 25-500 MG CAPS Take 1 Dose by mouth daily.     No current facility-administered medications for this visit.   Facility-Administered Medications Ordered in Other Visits  Medication Dose Route Frequency Provider Last Rate Last Admin   ondansetron (ZOFRAN) injection 4 mg  4 mg Intravenous Once Jakaria Lavergne, Kirt Boys, NP        VITAL SIGNS: There were no vitals taken for this visit. There were no vitals filed for this visit.   Estimated body mass index is 28.64 kg/m as calculated from the following:   Height as of 05/17/22: '5\' 1"'$  (1.549 m).   Weight as of 05/17/22: 151 lb 9.6 oz (68.8 kg).  LABS: CBC:    Component Value Date/Time   WBC 7.6 05/19/2022 1354   HGB 9.6 (L) 05/19/2022 1354   HCT 29.4 (L) 05/19/2022 1354   PLT 366 05/19/2022 1354   MCV 92.7 05/19/2022 1354   NEUTROABS 5.8 05/19/2022 1354   LYMPHSABS 1.2 05/19/2022 1354   MONOABS 0.5 05/19/2022 1354   EOSABS 0.0 05/19/2022 1354   BASOSABS 0.1 05/19/2022 1354   Comprehensive Metabolic Panel:    Component Value Date/Time   NA 137 05/19/2022 1354   K 4.1 05/19/2022 1354   CL 102 05/19/2022 1354   CO2 26 05/19/2022 1354   BUN 24 (H) 05/19/2022 1354   CREATININE 0.80 05/19/2022 1354   GLUCOSE 106 (H) 05/19/2022 1354    CALCIUM 9.0 05/19/2022 1354   AST 19 05/19/2022 1354   ALT 12 05/19/2022 1354   ALKPHOS 66 05/19/2022 1354   BILITOT 0.6 05/19/2022 1354   BILITOT 0.6 03/06/2015 0910   PROT 7.0 05/19/2022 1354   PROT 6.6 03/06/2015 0910   ALBUMIN 3.9 05/19/2022 1354   ALBUMIN 4.6 03/06/2015 0910    RADIOGRAPHIC STUDIES: CT Angio Abd/Pel W and/or Wo Contrast  Result Date: 05/03/2022 CLINICAL DATA:  Esophageal carcinoma, lymphadenopathy, GI bleed, melena EXAM: CTA ABDOMEN AND PELVIS WITHOUT AND WITH CONTRAST TECHNIQUE: Multidetector CT imaging of the abdomen and pelvis was performed using the standard protocol during bolus administration of intravenous contrast. Multiplanar reconstructed images and MIPs were obtained and reviewed to evaluate the vascular anatomy. RADIATION DOSE REDUCTION: This exam was performed  according to the departmental dose-optimization program which includes automated exposure control, adjustment of the mA and/or kV according to patient size and/or use of iterative reconstruction technique. CONTRAST:  79m OMNIPAQUE IOHEXOL 350 MG/ML SOLN COMPARISON:  PET-CT 03/22/2022 and previous FINDINGS: VASCULAR Aorta: Mild calcified atheromatous plaque. No aneurysm, dissection, or stenosis. Celiac: Patent without evidence of aneurysm, dissection, vasculitis or significant stenosis. SMA: Patent without evidence of aneurysm, dissection, vasculitis or significant stenosis. Renals: Single left, widely patent. Duplicated right, superior dominant, both patent. IMA: Patent without evidence of aneurysm, dissection, vasculitis or significant stenosis. Inflow: Minimal calcified plaque. No aneurysm, dissection, or stenosis. Proximal Outflow: Bilateral common femoral and visualized portions of the superficial and profunda femoral arteries are patent without evidence of aneurysm, dissection, vasculitis or significant stenosis. Veins: Patent hepatic veins, portal vein, SM V, splenic vein, bilateral renal veins.  Incomplete opacification of the iliac venous system and IVC, which appear grossly unremarkable. No venous pathology is evident. Review of the MIP images confirms the above findings. NON-VASCULAR Lower chest: Catheter tip in the right atrium. No pleural or pericardial effusion. Visualized lung bases clear. Hepatobiliary: No focal liver abnormality is seen. No gallstones, gallbladder wall thickening, or biliary dilatation. Pancreas: Unremarkable. No pancreatic ductal dilatation or surrounding inflammatory changes. Spleen: Normal in size without focal abnormality. Adrenals/Urinary Tract: No adrenal mass. Symmetric renal enhancement without focal lesion or hydronephrosis. Urinary bladder is physiologically distended. Stomach/Bowel: Eccentric soft tissue mass at the GE junction with intraluminal extension. The stomach is incompletely distended. Small bowel decompressed. Scattered oral contrast material in the stomach and small bowel limiting sensitivity for acute GI bleed. The colon is incompletely distended, without evidence of active bleed. Lymphatic: 2.9 cm left para-aortic mass, previously 2.5 cm. 1.5 cm enlarged aortocaval lymph node. Enhancing periportal and gastrohepatic ligament adenopathy as before. Reproductive: Status post hysterectomy. No adnexal masses. Other: Bilateral pelvic phleboliths.  No ascites.  No free air. Musculoskeletal: No acute or significant osseous findings. IMPRESSION: 1. No evidence of active GI bleed, sensitivity somewhat decreased by residual oral contrast material in the stomach and small bowel. 2. Some interval progression of mesenteric and retroperitoneal adenopathy since PET-CT 03/22/2022. 3. Mass at the GE junction as before. Electronically Signed   By: DLucrezia EuropeM.D.   On: 05/03/2022 15:43   DG Chest 2 View  Result Date: 05/03/2022 CLINICAL DATA:  Weakness, hypotension EXAM: CHEST - 2 VIEW COMPARISON:  05/01/2022 FINDINGS: Right IJ approach chest port remains in place. The  heart size and mediastinal contours are within normal limits. Both lungs are clear. The visualized skeletal structures are unremarkable. IMPRESSION: No active cardiopulmonary disease. Electronically Signed   By: NDavina PokeD.O.   On: 05/03/2022 12:49   DG Chest Portable 1 View  Result Date: 05/01/2022 CLINICAL DATA:  Hypotension, weakness, history of esophageal carcinoma EXAM: PORTABLE CHEST 1 VIEW COMPARISON:  04/13/2022 FINDINGS: The heart size and mediastinal contours are within normal limits. Both lungs are clear. The visualized skeletal structures are unremarkable. Tip of right IJ chest port is seen in superior vena cava. IMPRESSION: No active disease. Electronically Signed   By: PElmer PickerM.D.   On: 05/01/2022 20:24    PERFORMANCE STATUS (ECOG) : 1 - Symptomatic but completely ambulatory  Review of Systems Unless otherwise noted, a complete review of systems is negative.  Physical Exam General: NAD Cardiovascular: regular rate and rhythm Pulmonary: clear ant fields Abdomen: soft, nontender, + bowel sounds GU: no suprapubic tenderness Extremities: no edema, no joint deformities Skin:  no rashes Neurological: Grossly nonfocal  IMPRESSION/PLAN: Nausea -most likely secondary to chemotherapy given timing.  Patient is not routinely taking antiemetics.  We discussed restarting olanzapine at bedtime and taking scheduled ondansetron for several days.  We will plan supportive care next week with labs/fluids.  Constipation -continue MiraLAX.  May add Senokot  Labs/fluids next week  Patient expressed understanding and was in agreement with this plan. She also understands that She can call clinic at any time with any questions, concerns, or complaints.   Thank you for allowing me to participate in the care of this very pleasant patient.   Time Total: 20 minutes  Visit consisted of counseling and education dealing with the complex and emotionally intense issues of symptom  management in the setting of serious illness.Greater than 50%  of this time was spent counseling and coordinating care related to the above assessment and plan.  Signed by: Altha Harm, PhD, NP-C

## 2022-05-24 ENCOUNTER — Other Ambulatory Visit: Payer: Self-pay | Admitting: Oncology

## 2022-05-25 MED FILL — Iron Sucrose Inj 20 MG/ML (Fe Equiv): INTRAVENOUS | Qty: 10 | Status: AC

## 2022-05-26 ENCOUNTER — Inpatient Hospital Stay: Payer: BC Managed Care – PPO

## 2022-05-26 ENCOUNTER — Other Ambulatory Visit: Payer: Self-pay

## 2022-05-26 VITALS — BP 89/48 | HR 81 | Temp 98.8°F | Resp 16

## 2022-05-26 DIAGNOSIS — C7A8 Other malignant neuroendocrine tumors: Secondary | ICD-10-CM

## 2022-05-26 DIAGNOSIS — Z5111 Encounter for antineoplastic chemotherapy: Secondary | ICD-10-CM | POA: Diagnosis not present

## 2022-05-26 DIAGNOSIS — D508 Other iron deficiency anemias: Secondary | ICD-10-CM

## 2022-05-26 LAB — CBC WITH DIFFERENTIAL/PLATELET
Abs Immature Granulocytes: 0.02 10*3/uL (ref 0.00–0.07)
Basophils Absolute: 0.1 10*3/uL (ref 0.0–0.1)
Basophils Relative: 1 %
Eosinophils Absolute: 0.1 10*3/uL (ref 0.0–0.5)
Eosinophils Relative: 2 %
HCT: 26.9 % — ABNORMAL LOW (ref 36.0–46.0)
Hemoglobin: 8.7 g/dL — ABNORMAL LOW (ref 12.0–15.0)
Immature Granulocytes: 0 %
Lymphocytes Relative: 23 %
Lymphs Abs: 1.3 10*3/uL (ref 0.7–4.0)
MCH: 30.7 pg (ref 26.0–34.0)
MCHC: 32.3 g/dL (ref 30.0–36.0)
MCV: 95.1 fL (ref 80.0–100.0)
Monocytes Absolute: 0.8 10*3/uL (ref 0.1–1.0)
Monocytes Relative: 14 %
Neutro Abs: 3.3 10*3/uL (ref 1.7–7.7)
Neutrophils Relative %: 60 %
Platelets: 215 10*3/uL (ref 150–400)
RBC: 2.83 MIL/uL — ABNORMAL LOW (ref 3.87–5.11)
RDW: 20 % — ABNORMAL HIGH (ref 11.5–15.5)
WBC: 5.5 10*3/uL (ref 4.0–10.5)
nRBC: 0 % (ref 0.0–0.2)

## 2022-05-26 LAB — COMPREHENSIVE METABOLIC PANEL
ALT: 9 U/L (ref 0–44)
AST: 20 U/L (ref 15–41)
Albumin: 3.4 g/dL — ABNORMAL LOW (ref 3.5–5.0)
Alkaline Phosphatase: 64 U/L (ref 38–126)
Anion gap: 6 (ref 5–15)
BUN: 9 mg/dL (ref 8–23)
CO2: 25 mmol/L (ref 22–32)
Calcium: 8.4 mg/dL — ABNORMAL LOW (ref 8.9–10.3)
Chloride: 106 mmol/L (ref 98–111)
Creatinine, Ser: 0.75 mg/dL (ref 0.44–1.00)
GFR, Estimated: >60 mL/min (ref >60)
Glucose, Bld: 132 mg/dL — ABNORMAL HIGH (ref 70–99)
Potassium: 3.7 mmol/L (ref 3.5–5.1)
Sodium: 137 mmol/L (ref 135–145)
Total Bilirubin: 0.4 mg/dL (ref 0.3–1.2)
Total Protein: 6.2 g/dL — ABNORMAL LOW (ref 6.5–8.1)

## 2022-05-26 MED ORDER — HEPARIN SOD (PORK) LOCK FLUSH 100 UNIT/ML IV SOLN
500.0000 [IU] | Freq: Once | INTRAVENOUS | Status: AC | PRN
  Administered 2022-05-26: 500 [IU]
  Filled 2022-05-26: qty 5

## 2022-05-26 MED ORDER — SODIUM CHLORIDE 0.9% FLUSH
10.0000 mL | Freq: Once | INTRAVENOUS | Status: AC | PRN
  Administered 2022-05-26: 10 mL
  Filled 2022-05-26: qty 10

## 2022-05-26 MED ORDER — SODIUM CHLORIDE 0.9 % IV SOLN
INTRAVENOUS | Status: DC
  Filled 2022-05-26 (×2): qty 250

## 2022-05-30 ENCOUNTER — Encounter: Payer: Self-pay | Admitting: Oncology

## 2022-05-30 ENCOUNTER — Other Ambulatory Visit: Payer: Self-pay | Admitting: Oncology

## 2022-05-30 ENCOUNTER — Other Ambulatory Visit: Payer: Self-pay

## 2022-05-30 DIAGNOSIS — C7A8 Other malignant neuroendocrine tumors: Secondary | ICD-10-CM

## 2022-05-30 MED ORDER — LORAZEPAM 0.5 MG PO TABS: 0.5000 mg | ORAL_TABLET | Freq: Four times a day (QID) | ORAL | 0 refills | Status: DC | PRN

## 2022-05-30 MED FILL — Dexamethasone Sodium Phosphate Inj 100 MG/10ML: INTRAMUSCULAR | Qty: 1 | Status: AC

## 2022-05-31 ENCOUNTER — Encounter: Payer: Self-pay | Admitting: Oncology

## 2022-05-31 ENCOUNTER — Inpatient Hospital Stay: Payer: BC Managed Care – PPO

## 2022-05-31 ENCOUNTER — Inpatient Hospital Stay (HOSPITAL_BASED_OUTPATIENT_CLINIC_OR_DEPARTMENT_OTHER): Payer: BC Managed Care – PPO | Admitting: Oncology

## 2022-05-31 VITALS — BP 95/61 | HR 86 | Temp 97.0°F | Resp 16 | Wt 155.8 lb

## 2022-05-31 DIAGNOSIS — C7A8 Other malignant neuroendocrine tumors: Secondary | ICD-10-CM

## 2022-05-31 DIAGNOSIS — Z5111 Encounter for antineoplastic chemotherapy: Secondary | ICD-10-CM

## 2022-05-31 LAB — COMPREHENSIVE METABOLIC PANEL
ALT: 18 U/L (ref 0–44)
AST: 28 U/L (ref 15–41)
Albumin: 3.7 g/dL (ref 3.5–5.0)
Alkaline Phosphatase: 70 U/L (ref 38–126)
Anion gap: 6 (ref 5–15)
BUN: 15 mg/dL (ref 8–23)
CO2: 25 mmol/L (ref 22–32)
Calcium: 8.8 mg/dL — ABNORMAL LOW (ref 8.9–10.3)
Chloride: 106 mmol/L (ref 98–111)
Creatinine, Ser: 0.77 mg/dL (ref 0.44–1.00)
GFR, Estimated: >60 mL/min (ref >60)
Glucose, Bld: 119 mg/dL — ABNORMAL HIGH (ref 70–99)
Potassium: 3.9 mmol/L (ref 3.5–5.1)
Sodium: 137 mmol/L (ref 135–145)
Total Bilirubin: 0.3 mg/dL (ref 0.3–1.2)
Total Protein: 6.7 g/dL (ref 6.5–8.1)

## 2022-05-31 LAB — CBC WITH DIFFERENTIAL/PLATELET
Abs Immature Granulocytes: 0.01 10*3/uL (ref 0.00–0.07)
Basophils Absolute: 0.1 10*3/uL (ref 0.0–0.1)
Basophils Relative: 1 %
Eosinophils Absolute: 0.3 10*3/uL (ref 0.0–0.5)
Eosinophils Relative: 7 %
HCT: 30.9 % — ABNORMAL LOW (ref 36.0–46.0)
Hemoglobin: 9.9 g/dL — ABNORMAL LOW (ref 12.0–15.0)
Immature Granulocytes: 0 %
Lymphocytes Relative: 26 %
Lymphs Abs: 1.2 10*3/uL (ref 0.7–4.0)
MCH: 30.5 pg (ref 26.0–34.0)
MCHC: 32 g/dL (ref 30.0–36.0)
MCV: 95.1 fL (ref 80.0–100.0)
Monocytes Absolute: 0.7 10*3/uL (ref 0.1–1.0)
Monocytes Relative: 15 %
Neutro Abs: 2.3 10*3/uL (ref 1.7–7.7)
Neutrophils Relative %: 51 %
Platelets: 174 10*3/uL (ref 150–400)
RBC: 3.25 MIL/uL — ABNORMAL LOW (ref 3.87–5.11)
RDW: 19.9 % — ABNORMAL HIGH (ref 11.5–15.5)
WBC: 4.6 10*3/uL (ref 4.0–10.5)
nRBC: 0 % (ref 0.0–0.2)

## 2022-05-31 MED ORDER — OXALIPLATIN CHEMO INJECTION 100 MG/20ML
65.0000 mg/m2 | Freq: Once | INTRAVENOUS | Status: AC
  Administered 2022-05-31: 110 mg via INTRAVENOUS
  Filled 2022-05-31: qty 20

## 2022-05-31 MED ORDER — SODIUM CHLORIDE 0.9 % IV SOLN
2400.0000 mg/m2 | INTRAVENOUS | Status: DC
  Administered 2022-05-31: 4000 mg via INTRAVENOUS
  Filled 2022-05-31: qty 80

## 2022-05-31 MED ORDER — PALONOSETRON HCL INJECTION 0.25 MG/5ML
0.2500 mg | Freq: Once | INTRAVENOUS | Status: AC
  Administered 2022-05-31: 0.25 mg via INTRAVENOUS
  Filled 2022-05-31: qty 5

## 2022-05-31 MED ORDER — DEXTROSE 5 % IV SOLN
Freq: Once | INTRAVENOUS | Status: AC
  Filled 2022-05-31: qty 250

## 2022-05-31 MED ORDER — LEUCOVORIN CALCIUM INJECTION 350 MG
650.0000 mg | Freq: Once | INTRAVENOUS | Status: AC
  Administered 2022-05-31: 650 mg via INTRAVENOUS
  Filled 2022-05-31: qty 32.5

## 2022-05-31 MED ORDER — SODIUM CHLORIDE 0.9 % IV SOLN
10.0000 mg | Freq: Once | INTRAVENOUS | Status: AC
  Administered 2022-05-31: 10 mg via INTRAVENOUS
  Filled 2022-05-31: qty 10

## 2022-05-31 MED ORDER — FLUOROURACIL CHEMO INJECTION 2.5 GM/50ML
400.0000 mg/m2 | Freq: Once | INTRAVENOUS | Status: AC
  Administered 2022-05-31: 650 mg via INTRAVENOUS
  Filled 2022-05-31: qty 13

## 2022-05-31 NOTE — Progress Notes (Signed)
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 Haig Prophet on 03/01/2022 EGD showed a medium-sized ulcerating mass with no bleeding or stigmata of recent bleeding at the GE junction..  Mass was partially obstructing.  There is also mention of another ulcerated noncircumferential mass with oozing bleeding found at the GE junction with extension into the cardia.  Both these masses were biopsied.  Pathology was pending at the time of my visit with the patient and was reported to be a poorly differentiated carcinoma.  Patient is currently able to swallow soft foods without much difficulty.  However more solid foods occasionally feel stuck.    PET was denied by insurance.  CT chest abdomen and pelvis with contrast showed asymmetric distal esophageal wall thickening with thickening extending around the lesser curvature of the stomach through the gastric cardia and proximal fundus.  Paratracheal/paraesophageal, gastrohepatic and PET retroperitoneal adenopathy.  Left periaortic lymph node measuring 17 mm at the level of left renal hilum.   Final pathology showed there were 2 specimens collected.  Specimen a was poorly differentiated carcinoma with ulceration and necrosis.v verys weakly positive for synaptophysin 50% of the cells.  Cells negative for CK7 and CK23 for the BF1 CDX2 100 Melan-A HMB45  and CD20.  Specimen B showed large cell neuroendocrine carcinoma with neoplastic cells positive for CD56 with diffuse strong staining and majority of the neoplastic cells were also positive for synaptophysin with weak staining Ki-67 95%   Patient went to [facility] for second opinion.  Pathology was also looked at but was signed out as poorly differentiated adenocarcinoma at [facility].  Plan was to see if patient responds to cis etoposide Tecentriq regimen and if there is no adequate response switch her to FOLFOX    Interval history-tolerating FOLFOX chemotherapy well.  Denies any significant tingling numbness in her hands and feet.  Appetite is improving and she is gaining weight.  She does have on and off episodes of nausea well controlled with nausea medications.  Ongoing fatigue.  Denies any blood loss in her stool or urine  ECOG PS- 1 Pain scale- 0   Review of systems- Review of Systems  Constitutional:  Positive for malaise/fatigue. Negative for chills, fever and weight loss.  HENT:  Negative for congestion, ear discharge and nosebleeds.   Eyes:  Negative for blurred vision.  Respiratory:  Negative for cough, hemoptysis, sputum production, shortness of breath and wheezing.   Cardiovascular:  Negative for chest pain, palpitations, orthopnea and claudication.  Gastrointestinal:  Negative for abdominal pain, blood in stool, constipation, diarrhea, heartburn, melena, nausea and vomiting.  Genitourinary:  Negative for dysuria, flank pain, frequency, hematuria and urgency.  Musculoskeletal:  Negative for back pain, joint pain and myalgias.  Skin:  Negative for rash.  Neurological:  Negative for dizziness, tingling, focal weakness, seizures, weakness and headaches.  Endo/Heme/Allergies:  Does not bruise/bleed easily.  Psychiatric/Behavioral:  Negative for depression and suicidal ideas. The  patient does not have insomnia.       Allergies  Allergen Reactions   Sulfa Antibiotics Hives    Says her  tongue swelled a bit   Sulfonamide Derivatives Swelling    Tongue swelling     Past Medical History:  Diagnosis Date   Anemia    Arthritis    Atrial fibrillation ([place])    B12 deficiency    Dysrhythmia    Hyperlipidemia    Hypothyroidism    Menopause    Palpitations    Hx of, 25 years ago   Vertigo      Past Surgical History:  Procedure Laterality Date   BILATERAL SALPINGOOPHORECTOMY  2008   DILATION AND CURETTAGE OF UTERUS  08/24/2006   ESOPHAGOGASTRODUODENOSCOPY (EGD) WITH PROPOFOL N/A 03/01/2022   Procedure: ESOPHAGOGASTRODUODENOSCOPY (EGD) WITH PROPOFOL;  Surgeon: Lesly Rubenstein, MD;  Location: [facility] ENDOSCOPY;  Service: Endoscopy;  Laterality: N/A;   ESOPHAGOGASTRODUODENOSCOPY (EGD) WITH PROPOFOL N/A 05/04/2022   Procedure: ESOPHAGOGASTRODUODENOSCOPY (EGD) WITH PROPOFOL;  Surgeon: [name], MD;  [facility] ENDOSCOPY;  Service: Gastroenterology;  Laterality: N/A;   incision tendon sheath for trigger finger Right    ring finger   LAPAROSCOPIC SUPRACERVICAL HYSTERECTOMY  2008   Dr. Ammie Dalton; with BSO   NOSE SURGERY  03/2008   PORTA CATH INSERTION N/A 03/21/2022   Procedure: PORTA CATH INSERTION;  Surgeon: Algernon Huxley, MD;  Location: [facility];  Service: Cardiovascular;  Laterality: N/A;    Social History   Socioeconomic History   Marital status: Married    Spouse name: Not on file   Number of children: Not on file   Years of education: Not on file   Highest education level: Not on file  Occupational History   Occupation: Full time  Tobacco Use   Smoking status: Never   Smokeless tobacco: Never  Vaping Use   Vaping Use: Never used  Substance and Sexual Activity   Alcohol use: Never   Drug use: Never   Sexual activity: Not Currently    Birth control/protection: Surgical    Comment: Hysterectomy  Other Topics Concern   Not on file  Social History Narrative   Married with 2 children   Gets regular exercise   Social Determinants of  Health   Financial Resource Strain: Low Risk  (04/20/2022)   Overall Financial Resource Strain (CARDIA)    Difficulty of Paying Living Expenses: Not very hard  Food Insecurity: No Food Insecurity (04/20/2022)   Hunger Vital Sign    Worried About Running Out of Food in the Last Year: Never true    Ran Out of Food in the Last Year: Never true  Transportation Needs: No Transportation Needs (04/20/2022)   PRAPARE - Hydrologist (Medical): No    Lack of Transportation (Non-Medical): No  Physical Activity: Inactive (04/20/2022)   Exercise Vital Sign    Days of Exercise per Week: 0 days    Minutes of Exercise per Session: 0 min  Stress: No Stress Concern Present (04/20/2022)   [place]    Feeling of Stress : Only a little  Social Connections: Socially Integrated (04/20/2022)   Social Connection and Isolation Panel [NHANES]    Frequency of Communication with Friends and Family: More than three times a week    Frequency of Social Gatherings with Friends and Family: More than three times a week    Attends Religious Services: More than 4  times per year    Active Member of Clubs or Organizations: Yes    Attends Music therapist: More than 4 times per year    Marital Status: Married  Human resources officer Violence: Not on file    Family History  Problem Relation Age of Onset   Hyperlipidemia Mother    Hypertension Mother    Stroke Mother    Heart disease Father    Hyperlipidemia Father    Atrial fibrillation Father    Melanoma Father    Arrhythmia Sister        Atrial fibrillation   Heart disease Sister    Atrial fibrillation Sister    Heart attack Brother    Breast cancer Neg Hx      Current Outpatient Medications:    Calcium Carbonate-Vitamin D 600-200 MG-UNIT TABS, Take 1 tablet by mouth daily.  , Disp: , Rfl:    cetirizine (ZYRTEC) 10 MG tablet, Take 10 mg by mouth daily., Disp: ,  Rfl:    Cholecalciferol 25 MCG (1000 UT) tablet, Take 1,000 Units by mouth daily., Disp: , Rfl:    flecainide (TAMBOCOR) 50 MG tablet, Take 1 tablet by mouth 2 (two) times daily., Disp: , Rfl:    LORazepam (ATIVAN) 0.5 MG tablet, Take 1 tablet (0.5 mg total) by mouth every 6 (six) hours as needed (Nausea or vomiting)., Disp: 30 tablet, Rfl: 0   midodrine (PROAMATINE) 10 MG tablet, Take by mouth., Disp: , Rfl:    mometasone (NASONEX) 50 MCG/ACT nasal spray, Place 2 sprays into the nose daily., Disp: , Rfl:    nystatin cream (MYCOSTATIN), Apply topically 2 (two) times daily., Disp: , Rfl:    OLANZapine (ZYPREXA) 10 MG tablet, TAKE 1 TABLET BY MOUTH EVERYDAY AT BEDTIME, Disp: 90 tablet, Rfl: 1   ondansetron (ZOFRAN) 8 MG tablet, TAKE 1 TABLET BY MOUTH 2 TIMES DAILY AS NEEDED. START ON THE THIRD DAY AFTER CISPLATIN CHEMOTHERAPY., Disp: 18 tablet, Rfl: 3   pantoprazole (PROTONIX) 40 MG tablet, Take 40 mg by mouth daily., Disp: , Rfl:    Saccharomyces boulardii (PROBIOTIC) 250 MG CAPS, Take 1 capsule by mouth daily., Disp: , Rfl:    simvastatin (ZOCOR) 20 MG tablet, TAKE 1 TABLET BY MOUTH EVERY EVENING., Disp: 30 tablet, Rfl: 0   traZODone (DESYREL) 50 MG tablet, Take 50 mg by mouth at bedtime as needed., Disp: , Rfl:    Zinc Citrate-Phytase (ZYTAZE) 25-500 MG CAPS, Take 1 Dose by mouth daily., Disp: , Rfl:    ondansetron (ZOFRAN-ODT) 4 MG disintegrating tablet, Take 1 tablet (4 mg total) by mouth every 8 (eight) hours as needed for nausea or vomiting. (Patient not taking: Reported on 05/31/2022), Disp: 20 tablet, Rfl: 0 No current facility-administered medications for this visit.  Facility-Administered Medications Ordered in Other Visits:    fluorouracil (ADRUCIL) 4,000 mg in sodium chloride 0.9 % 70 mL chemo infusion, 2,400 mg/m2 (Treatment Plan Recorded), Intravenous, 1 day or 1 dose, Sindy Guadeloupe, MD   fluorouracil (ADRUCIL) chemo injection 650 mg, 400 mg/m2 (Treatment Plan Recorded), Intravenous,  Once, Sindy Guadeloupe, MD   leucovorin 650 mg in dextrose 5 % 250 mL infusion, 650 mg, Intravenous, Once, Sindy Guadeloupe, MD, Last Rate: 141 mL/hr at 05/31/22 1036, 650 mg at 05/31/22 1036   oxaliplatin (ELOXATIN) 110 mg in dextrose 5 % 500 mL chemo infusion, 65 mg/m2 (Treatment Plan Recorded), Intravenous, Once, Sindy Guadeloupe, MD, Last Rate: 261 mL/hr at 05/31/22 1037, 110 mg at 05/31/22 1037  Physical exam:  Vitals:   05/31/22 0854  BP: 95/61  Pulse: 86  Resp: 16  Temp: (!) 97 F (36.1 C)  SpO2: 99%  Weight: 155 lb 12.8 oz (70.7 kg)   Physical Exam Constitutional:      General: She is not in acute distress. Cardiovascular:     Rate and Rhythm: Normal rate and regular rhythm.     Heart sounds: Normal heart sounds.  Pulmonary:     Effort: Pulmonary effort is normal.     Breath sounds: Normal breath sounds.  Abdominal:     General: Bowel sounds are normal.     Palpations: Abdomen is soft.  Skin:    General: Skin is warm and dry.  Neurological:     Mental Status: She is alert and oriented to person, place, and time.         Latest Ref Rng & Units 05/31/2022    8:33 AM  CMP  Glucose 70 - 99 mg/dL 119   BUN 8 - 23 mg/dL 15   Creatinine 0.44 - 1.00 mg/dL 0.77   Sodium 135 - 145 mmol/L 137   Potassium 3.5 - 5.1 mmol/L 3.9   Chloride 98 - 111 mmol/L 106   CO2 22 - 32 mmol/L 25   Calcium 8.9 - 10.3 mg/dL 8.8   Total Protein 6.5 - 8.1 g/dL 6.7   Total Bilirubin 0.3 - 1.2 mg/dL 0.3   Alkaline Phos 38 - 126 U/L 70   AST 15 - 41 U/L 28   ALT 0 - 44 U/L 18       Latest Ref Rng & Units 05/31/2022    8:33 AM  CBC  WBC 4.0 - 10.5 K/uL 4.6   Hemoglobin 12.0 - 15.0 g/dL 9.9   Hematocrit 36.0 - 46.0 % 30.9   Platelets 150 - 400 K/uL 174     No images are attached to the encounter.  CT Angio Abd/Pel W and/or Wo Contrast  Result Date: 05/03/2022 CLINICAL DATA:  Esophageal carcinoma, lymphadenopathy, GI bleed, melena EXAM: CTA ABDOMEN AND PELVIS WITHOUT AND WITH CONTRAST  TECHNIQUE: Multidetector CT imaging of the abdomen and pelvis was performed using the standard protocol during bolus administration of intravenous contrast. Multiplanar reconstructed images and MIPs were obtained and reviewed to evaluate the vascular anatomy. RADIATION DOSE REDUCTION: This exam was performed according to the departmental dose-optimization program which includes automated exposure control, adjustment of the mA and/or kV according to patient size and/or use of iterative reconstruction technique. CONTRAST:  39m OMNIPAQUE IOHEXOL 350 MG/ML SOLN COMPARISON:  PET-CT 03/22/2022 and previous FINDINGS: VASCULAR Aorta: Mild calcified atheromatous plaque. No aneurysm, dissection, or stenosis. Celiac: Patent without evidence of aneurysm, dissection, vasculitis or significant stenosis. SMA: Patent without evidence of aneurysm, dissection, vasculitis or significant stenosis. Renals: Single left, widely patent. Duplicated right, superior dominant, both patent. IMA: Patent without evidence of aneurysm, dissection, vasculitis or significant stenosis. Inflow: Minimal calcified plaque. No aneurysm, dissection, or stenosis. Proximal Outflow: Bilateral common femoral and visualized portions of the superficial and profunda femoral arteries are patent without evidence of aneurysm, dissection, vasculitis or significant stenosis. Veins: Patent hepatic veins, portal vein, SM V, splenic vein, bilateral renal veins. Incomplete opacification of the iliac venous system and IVC, which appear grossly unremarkable. No venous pathology is evident. Review of the MIP images confirms the above findings. NON-VASCULAR Lower chest: Catheter tip in the right atrium. No pleural or pericardial effusion. Visualized lung bases clear. Hepatobiliary: No focal liver abnormality is seen.  No gallstones, gallbladder wall thickening, or biliary dilatation. Pancreas: Unremarkable. No pancreatic ductal dilatation or surrounding inflammatory changes.  Spleen: Normal in size without focal abnormality. Adrenals/Urinary Tract: No adrenal mass. Symmetric renal enhancement without focal lesion or hydronephrosis. Urinary bladder is physiologically distended. Stomach/Bowel: Eccentric soft tissue mass at the GE junction with intraluminal extension. The stomach is incompletely distended. Small bowel decompressed. Scattered oral contrast material in the stomach and small bowel limiting sensitivity for acute GI bleed. The colon is incompletely distended, without evidence of active bleed. Lymphatic: 2.9 cm left para-aortic mass, previously 2.5 cm. 1.5 cm enlarged aortocaval lymph node. Enhancing periportal and gastrohepatic ligament adenopathy as before. Reproductive: Status post hysterectomy. No adnexal masses. Other: Bilateral pelvic phleboliths.  No ascites.  No free air. Musculoskeletal: No acute or significant osseous findings. IMPRESSION: 1. No evidence of active GI bleed, sensitivity somewhat decreased by residual oral contrast material in the stomach and small bowel. 2. Some interval progression of mesenteric and retroperitoneal adenopathy since PET-CT 03/22/2022. 3. Mass at the GE junction as before. Electronically Signed   By: Lucrezia Europe M.D.   On: 05/03/2022 15:43   DG Chest 2 View  Result Date: 05/03/2022 CLINICAL DATA:  Weakness, hypotension EXAM: CHEST - 2 VIEW COMPARISON:  05/01/2022 FINDINGS: Right IJ approach chest port remains in place. The heart size and mediastinal contours are within normal limits. Both lungs are clear. The visualized skeletal structures are unremarkable. IMPRESSION: No active cardiopulmonary disease. Electronically Signed   By: Davina Poke D.O.   On: 05/03/2022 12:49   DG Chest Portable 1 View  Result Date: 05/01/2022 CLINICAL DATA:  Hypotension, weakness, history of esophageal carcinoma EXAM: PORTABLE CHEST 1 VIEW COMPARISON:  04/13/2022 FINDINGS: The heart size and mediastinal contours are within normal limits. Both lungs  are clear. The visualized skeletal structures are unremarkable. Tip of right IJ chest port is seen in superior vena cava. IMPRESSION: No active disease. Electronically Signed   By: Elmer Picker M.D.   On: 05/01/2022 20:24     Assessment and plan- Patient is a 61 y.o. 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  He is here for on treatment assessment prior to cycle 2 of palliative FOLFOX chemotherapy  Counts okay to proceed with cycle 2 of palliative FOLFOX chemotherapy today and I will see her back in 2 weeks for cycle 3.She did not receive Udenyca with the last cycle and ANC is at 2.3 today.  If it is less than 1.5 next cycle I will consider adding it.  Iron deficiency anemia: S/p blood transfusion and 5 doses of Venofer.  Hemoglobin is improved to 9.9.  Continue to monitor.  I will plan to obtain repeat scans after 5 cycles of FOLFOX   Visit Diagnosis 1. Primary malignant neuroendocrine tumor of esophagus ([place])   2. Encounter for antineoplastic chemotherapy      Dr. Randa Evens, MD, MPH [facility] at [facility] [phone] 05/31/2022 12:22 PM

## 2022-05-31 NOTE — Patient Instructions (Signed)
[facility] CANCER CTR AT [place]  Discharge Instructions: Thank you for choosing [facility] to provide your oncology and hematology care.  If you have a lab appointment with the [facility], please go directly to the [facility] and check in at the registration area.  Wear comfortable clothing and clothing appropriate for easy access to any Portacath or PICC line.   We strive to give you quality time with your provider. You may need to reschedule your appointment if you arrive late (15 or more minutes).  Arriving late affects you and other patients whose appointments are after yours.  Also, if you miss three or more appointments without notifying the office, you may be dismissed from the clinic at the provider's discretion.      For prescription refill requests, have your pharmacy contact our office and allow 72 hours for refills to be completed.    Today you received the following chemotherapy and/or immunotherapy agents: Oxaliplatin, Adrucil      To help prevent nausea and vomiting after your treatment, we encourage you to take your nausea medication as directed.  BELOW ARE SYMPTOMS THAT SHOULD BE REPORTED IMMEDIATELY: *FEVER GREATER THAN 100.4 F (38 C) OR HIGHER *CHILLS OR SWEATING *NAUSEA AND VOMITING THAT IS NOT CONTROLLED WITH YOUR NAUSEA MEDICATION *UNUSUAL SHORTNESS OF BREATH *UNUSUAL BRUISING OR BLEEDING *URINARY PROBLEMS (pain or burning when urinating, or frequent urination) *BOWEL PROBLEMS (unusual diarrhea, constipation, pain near the anus) TENDERNESS IN MOUTH AND THROAT WITH OR WITHOUT PRESENCE OF ULCERS (sore throat, sores in mouth, or a toothache) UNUSUAL RASH, SWELLING OR PAIN  UNUSUAL VAGINAL DISCHARGE OR ITCHING   Items with * indicate a potential emergency and should be followed up as soon as possible or go to the Emergency Department if any problems should occur.  Please show the CHEMOTHERAPY ALERT CARD or IMMUNOTHERAPY ALERT CARD at  check-in to the Emergency Department and triage nurse.  Should you have questions after your visit or need to cancel or reschedule your appointment, please contact [facility] CANCER [place] AT [place]  [phone] and follow the prompts.  Office hours are 8:00 a.m. to 4:30 p.m. Monday - Friday. Please note that voicemails left after 4:00 p.m. may not be returned until the following business day.  We are closed weekends and major holidays. You have access to a nurse at all times for urgent questions. Please call the main number to the clinic [phone] and follow the prompts.  For any non-urgent questions, you may also contact your provider using MyChart. We now offer e-Visits for anyone 53 and older to request care online for non-urgent symptoms. For details visit [email or web address].   Also download the MyChart app! Go to the app store, search "MyChart", open the app, select [facility], and log in with your MyChart username and password.  Masks are optional in the cancer centers. If you would like for your care team to wear a mask while they are taking care of you, please let them know. For doctor visits, patients may have with them one support person who is at least 61 years old. At this time, visitors are not allowed in the infusion area.

## 2022-06-01 ENCOUNTER — Other Ambulatory Visit: Payer: Self-pay

## 2022-06-02 ENCOUNTER — Inpatient Hospital Stay: Payer: BC Managed Care – PPO

## 2022-06-02 VITALS — BP 106/73 | HR 82 | Resp 18

## 2022-06-02 DIAGNOSIS — Z5111 Encounter for antineoplastic chemotherapy: Secondary | ICD-10-CM | POA: Diagnosis not present

## 2022-06-02 DIAGNOSIS — C7A8 Other malignant neuroendocrine tumors: Secondary | ICD-10-CM

## 2022-06-02 MED ORDER — HEPARIN SOD (PORK) LOCK FLUSH 100 UNIT/ML IV SOLN
500.0000 [IU] | Freq: Once | INTRAVENOUS | Status: AC | PRN
  Administered 2022-06-02: 500 [IU]
  Filled 2022-06-02: qty 5

## 2022-06-02 MED ORDER — SODIUM CHLORIDE 0.9% FLUSH
10.0000 mL | INTRAVENOUS | Status: DC | PRN
  Administered 2022-06-02: 10 mL
  Filled 2022-06-02: qty 10

## 2022-06-06 ENCOUNTER — Encounter: Payer: Self-pay | Admitting: Oncology

## 2022-06-07 ENCOUNTER — Encounter: Payer: Self-pay | Admitting: Oncology

## 2022-06-07 ENCOUNTER — Telehealth: Payer: Self-pay | Admitting: *Deleted

## 2022-06-07 NOTE — Telephone Encounter (Signed)
Called patient. Apt made for smc tomorrow with josh- see phone note.

## 2022-06-07 NOTE — Telephone Encounter (Signed)
Patient reports swollen "burning" bottom lip since last treatment. She reports symptoms occurred after pump d/c last Thursday. She is using Aquaphor. She denies any other symptoms (no fever, no nausea/ vomiting/ diarrhea). No visible cancer sores in mouth.  She has magic mouth wash and uses this as needed but states that it's not helpful for the soreness on the lip. Patient would like a virtual visit with Josh tomorrow if possible. Pt understands that if she develops any other symptoms, she will need to be evaluated in our office.

## 2022-06-08 ENCOUNTER — Inpatient Hospital Stay (HOSPITAL_BASED_OUTPATIENT_CLINIC_OR_DEPARTMENT_OTHER): Payer: BC Managed Care – PPO | Admitting: Hospice and Palliative Medicine

## 2022-06-08 DIAGNOSIS — C7A8 Other malignant neuroendocrine tumors: Secondary | ICD-10-CM

## 2022-06-08 MED ORDER — LORAZEPAM 0.5 MG PO TABS: 0.5000 mg | ORAL_TABLET | Freq: Four times a day (QID) | ORAL | 0 refills | Status: DC | PRN

## 2022-06-08 NOTE — Progress Notes (Signed)
Virtual Visit via Video Note  I connected with Ariana Wilson on 06/08/22 at 10:15 AM EDT by a video enabled telemedicine application and verified that I am speaking with the correct person using two identifiers.  Location: Patient: Home Provider: Clinic   I discussed the limitations of evaluation and management by telemedicine and the availability of in person appointments. The patient expressed understanding and agreed to proceed.  History of Present Illness: Ariana Wilson is a 61 y.o. 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  She was diagnosed with cancer in June 2023.   She was hospitalized 04/13/2022 to 04/17/2022 with neutropenic fever.  Patient was found to have hemoglobin of 6.6 and was transfused with irradiated PRBC.  This hospitalization was complicated by A-fib with RVR and persistent hypotension.  Patient was started on midodrine.  Urine culture positive for staph hemolyticus.   Patient had 2 cycles of cisplatin and etoposide Tecentriq chemotherapy with minimal tumor response.  She was rotated to FOLFOX.   Observations/Objective: Patient completed cycle 2 FOLFOX on 05/31/2022.  She reports that she subsequently developed slight swelling to bottom lip with associated pain.  She states that her lip feels like it is sunburned.  She denies any oral lesions or ulcers.  She has not noticed any ulcers or lesions to the lip or any red or cracked gums.  She has been using an OTC lip balm.  Assessment and Plan: Lip pain -I cannot appreciate significant swelling on camera and it does not appear that there are any active ulcers or lesions.  Recommended continued supportive care with Vaseline/sun protectant.  Patient does have Magic mouthwash and we discussed salt/baking soda rinses if needed.  Case and plan discussed with Dr. Janese Banks.  Nausea/anxiety -well controlled with as needed antiemetics.  Patient did request refill of  lorazepam which was sent to pharmacy.  Follow Up Instructions: RTC next week as previously scheduled   I discussed the assessment and treatment plan with the patient. The patient was provided an opportunity to ask questions and all were answered. The patient agreed with the plan and demonstrated an understanding of the instructions.   The patient was advised to call back or seek an in-person evaluation if the symptoms worsen or if the condition fails to improve as anticipated.  I provided 15 minutes of non-face-to-face time during this encounter.   Irean Hong, NP

## 2022-06-13 MED FILL — Dexamethasone Sodium Phosphate Inj 100 MG/10ML: INTRAMUSCULAR | Qty: 1 | Status: AC

## 2022-06-14 ENCOUNTER — Inpatient Hospital Stay: Payer: BC Managed Care – PPO

## 2022-06-14 ENCOUNTER — Inpatient Hospital Stay: Payer: BC Managed Care – PPO | Attending: Oncology | Admitting: Oncology

## 2022-06-14 ENCOUNTER — Encounter: Payer: Self-pay | Admitting: Oncology

## 2022-06-14 VITALS — BP 94/51 | HR 75 | Temp 98.0°F | Resp 16 | Ht 61.0 in | Wt 157.0 lb

## 2022-06-14 DIAGNOSIS — T451X5A Adverse effect of antineoplastic and immunosuppressive drugs, initial encounter: Secondary | ICD-10-CM | POA: Insufficient documentation

## 2022-06-14 DIAGNOSIS — R300 Dysuria: Secondary | ICD-10-CM | POA: Diagnosis not present

## 2022-06-14 DIAGNOSIS — C7A8 Other malignant neuroendocrine tumors: Secondary | ICD-10-CM

## 2022-06-14 DIAGNOSIS — D6959 Other secondary thrombocytopenia: Secondary | ICD-10-CM | POA: Insufficient documentation

## 2022-06-14 DIAGNOSIS — Z5189 Encounter for other specified aftercare: Secondary | ICD-10-CM | POA: Insufficient documentation

## 2022-06-14 DIAGNOSIS — Z5111 Encounter for antineoplastic chemotherapy: Secondary | ICD-10-CM | POA: Diagnosis present

## 2022-06-14 LAB — CBC WITH DIFFERENTIAL/PLATELET
Abs Immature Granulocytes: 0.02 10*3/uL (ref 0.00–0.07)
Basophils Absolute: 0 10*3/uL (ref 0.0–0.1)
Basophils Relative: 1 %
Eosinophils Absolute: 0.3 10*3/uL (ref 0.0–0.5)
Eosinophils Relative: 7 %
HCT: 32.7 % — ABNORMAL LOW (ref 36.0–46.0)
Hemoglobin: 10.9 g/dL — ABNORMAL LOW (ref 12.0–15.0)
Immature Granulocytes: 1 %
Lymphocytes Relative: 30 %
Lymphs Abs: 1.2 10*3/uL (ref 0.7–4.0)
MCH: 31.9 pg (ref 26.0–34.0)
MCHC: 33.3 g/dL (ref 30.0–36.0)
MCV: 95.6 fL (ref 80.0–100.0)
Monocytes Absolute: 0.6 10*3/uL (ref 0.1–1.0)
Monocytes Relative: 16 %
Neutro Abs: 1.8 10*3/uL (ref 1.7–7.7)
Neutrophils Relative %: 45 %
Platelets: 107 10*3/uL — ABNORMAL LOW (ref 150–400)
RBC: 3.42 MIL/uL — ABNORMAL LOW (ref 3.87–5.11)
RDW: 20.6 % — ABNORMAL HIGH (ref 11.5–15.5)
WBC: 3.9 10*3/uL — ABNORMAL LOW (ref 4.0–10.5)
nRBC: 0 % (ref 0.0–0.2)

## 2022-06-14 LAB — COMPREHENSIVE METABOLIC PANEL
ALT: 57 U/L — ABNORMAL HIGH (ref 0–44)
AST: 69 U/L — ABNORMAL HIGH (ref 15–41)
Albumin: 3.7 g/dL (ref 3.5–5.0)
Alkaline Phosphatase: 71 U/L (ref 38–126)
Anion gap: 8 (ref 5–15)
BUN: 11 mg/dL (ref 8–23)
CO2: 25 mmol/L (ref 22–32)
Calcium: 8.8 mg/dL — ABNORMAL LOW (ref 8.9–10.3)
Chloride: 105 mmol/L (ref 98–111)
Creatinine, Ser: 0.74 mg/dL (ref 0.44–1.00)
GFR, Estimated: >60 mL/min (ref >60)
Glucose, Bld: 129 mg/dL — ABNORMAL HIGH (ref 70–99)
Potassium: 3.6 mmol/L (ref 3.5–5.1)
Sodium: 138 mmol/L (ref 135–145)
Total Bilirubin: 1 mg/dL (ref 0.3–1.2)
Total Protein: 6.8 g/dL (ref 6.5–8.1)

## 2022-06-14 MED ORDER — DEXTROSE 5 % IV SOLN
Freq: Once | INTRAVENOUS | Status: AC
  Filled 2022-06-14: qty 250

## 2022-06-14 MED ORDER — PALONOSETRON HCL INJECTION 0.25 MG/5ML
0.2500 mg | Freq: Once | INTRAVENOUS | Status: AC
  Administered 2022-06-14: 0.25 mg via INTRAVENOUS
  Filled 2022-06-14: qty 5

## 2022-06-14 MED ORDER — SODIUM CHLORIDE 0.9 % IV SOLN
2400.0000 mg/m2 | INTRAVENOUS | Status: DC
  Administered 2022-06-14: 4000 mg via INTRAVENOUS
  Filled 2022-06-14: qty 80

## 2022-06-14 MED ORDER — OXALIPLATIN CHEMO INJECTION 100 MG/20ML
65.0000 mg/m2 | Freq: Once | INTRAVENOUS | Status: AC
  Administered 2022-06-14: 110 mg via INTRAVENOUS
  Filled 2022-06-14: qty 20

## 2022-06-14 MED ORDER — FLUOROURACIL CHEMO INJECTION 2.5 GM/50ML
400.0000 mg/m2 | Freq: Once | INTRAVENOUS | Status: AC
  Administered 2022-06-14: 650 mg via INTRAVENOUS
  Filled 2022-06-14: qty 13

## 2022-06-14 MED ORDER — LEUCOVORIN CALCIUM INJECTION 350 MG
650.0000 mg | Freq: Once | INTRAVENOUS | Status: AC
  Administered 2022-06-14: 650 mg via INTRAVENOUS
  Filled 2022-06-14: qty 32.5

## 2022-06-14 MED ORDER — SODIUM CHLORIDE 0.9 % IV SOLN
10.0000 mg | Freq: Once | INTRAVENOUS | Status: AC
  Administered 2022-06-14: 10 mg via INTRAVENOUS
  Filled 2022-06-14: qty 10

## 2022-06-14 NOTE — Progress Notes (Signed)
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 Haig Prophet on 03/01/2022 EGD showed a medium-sized ulcerating mass with no bleeding or stigmata of recent bleeding at the GE junction..  Mass was partially obstructing.  There is also mention of another ulcerated noncircumferential mass with oozing bleeding found at the GE junction with extension into the cardia.  Both these masses were biopsied.  Pathology was pending at the time of my visit with the patient and was reported to be a poorly differentiated carcinoma.  Patient is currently able to swallow soft foods without much difficulty.  However more solid foods occasionally feel stuck.    PET was denied by insurance.  CT chest abdomen and pelvis with contrast showed asymmetric distal esophageal wall thickening with thickening extending around the lesser curvature of the stomach through the gastric cardia and proximal fundus.  Paratracheal/paraesophageal, gastrohepatic and PET retroperitoneal adenopathy.  Left periaortic lymph node measuring 17 mm at the level of left renal hilum.   Final pathology showed there were 2 specimens collected.  Specimen a was poorly differentiated carcinoma with ulceration and necrosis.v verys weakly positive for synaptophysin 50% of the cells.  Cells negative for CK7 and CK23 for the BF1 CDX2 100 Melan-A HMB45  and CD20.  Specimen B showed large cell neuroendocrine carcinoma with neoplastic cells positive for CD56 with diffuse strong staining and majority of the neoplastic cells were also positive for synaptophysin with weak staining Ki-67 95%   Patient went to [facility] for second opinion.  Pathology was also looked at but was signed out as poorly differentiated adenocarcinoma at [facility].  Plan was to see if patient responds to cis etoposide Tecentriq regimen and if there is no adequate response switch her to FOLFOX    Interval history-tolerating chemotherapy well.  She does have some self-limited nausea which lasts for a day or so.  Fatigue is improving.  She is able to eat normally without significant dysphagia.  ECOG PS- 1 Pain scale- 0   Review of systems- Review of Systems  Constitutional:  Positive for malaise/fatigue. Negative for chills, fever and weight loss.  HENT:  Negative for congestion, ear discharge and nosebleeds.   Eyes:  Negative for blurred vision.  Respiratory:  Negative for cough, hemoptysis, sputum production, shortness of breath and wheezing.   Cardiovascular:  Negative for chest pain, palpitations, orthopnea and claudication.  Gastrointestinal:  Negative for abdominal pain, blood in stool, constipation, diarrhea, heartburn, melena, nausea and vomiting.  Genitourinary:  Negative for dysuria, flank pain, frequency, hematuria and urgency.  Musculoskeletal:  Negative for back pain, joint pain and myalgias.  Skin:  Negative for rash.  Neurological:  Negative for dizziness, tingling, focal weakness, seizures, weakness and headaches.  Endo/Heme/Allergies:  Does not bruise/bleed easily.  Psychiatric/Behavioral:  Negative for depression and suicidal ideas. The patient does not have insomnia.       Allergies  Allergen Reactions   Sulfa Antibiotics Hives  Says her tongue swelled a bit   Sulfonamide Derivatives Swelling    Tongue swelling     Past Medical History:  Diagnosis Date    Anemia    Arthritis    Atrial fibrillation ([place])    B12 deficiency    Dysrhythmia    Hyperlipidemia    Hypothyroidism    Menopause    Palpitations    Hx of, 25 years ago   Vertigo      Past Surgical History:  Procedure Laterality Date   BILATERAL SALPINGOOPHORECTOMY  2008   DILATION AND CURETTAGE OF UTERUS  08/24/2006   ESOPHAGOGASTRODUODENOSCOPY (EGD) WITH PROPOFOL N/A 03/01/2022   Procedure: ESOPHAGOGASTRODUODENOSCOPY (EGD) WITH PROPOFOL;  Surgeon: Lesly Rubenstein, MD;  Location: [facility] ENDOSCOPY;  Service: Endoscopy;  Laterality: N/A;   ESOPHAGOGASTRODUODENOSCOPY (EGD) WITH PROPOFOL N/A 05/04/2022   Procedure: ESOPHAGOGASTRODUODENOSCOPY (EGD) WITH PROPOFOL;  Surgeon: [name], MD;  Location: [facility] ENDOSCOPY;  Service: Gastroenterology;  Laterality: N/A;   incision tendon sheath for trigger finger Right    ring finger   LAPAROSCOPIC SUPRACERVICAL HYSTERECTOMY  2008   Dr. Ammie Dalton; with BSO   NOSE SURGERY  03/2008   PORTA CATH INSERTION N/A 03/21/2022   Procedure: PORTA CATH INSERTION;  Surgeon: Algernon Huxley, MD;  Location: [place] [facility];  Service: Cardiovascular;  Laterality: N/A;    Social History   Socioeconomic History   Marital status: Married    Spouse name: Not on file   Number of children: Not on file   Years of education: Not on file   Highest education level: Not on file  Occupational History   Occupation: Full time  Tobacco Use   Smoking status: Never   Smokeless tobacco: Never  Vaping Use   Vaping Use: Never used  Substance and Sexual Activity   Alcohol use: Never   Drug use: Never   Sexual activity: Not Currently    Birth control/protection: Surgical    Comment: Hysterectomy  Other Topics Concern   Not on file  Social History Narrative   Married with 2 children   Gets regular exercise   Social Determinants of Health   Financial Resource Strain: Low Risk  (04/20/2022)   Overall Financial Resource Strain (CARDIA)    Difficulty of  Paying Living Expenses: Not very hard  Food Insecurity: No Food Insecurity (04/20/2022)   Hunger Vital Sign    Worried About Running Out of Food in the Last Year: Never true    Ran Out of Food in the Last Year: Never true  Transportation Needs: No Transportation Needs (04/20/2022)   PRAPARE - Hydrologist (Medical): No    Lack of Transportation (Non-Medical): No  Physical Activity: Inactive (04/20/2022)   Exercise Vital Sign    Days of Exercise per Week: 0 days    Minutes of Exercise per Session: 0 min  Stress: No Stress Concern Present (04/20/2022)   [place]    Feeling of Stress : Only a little  Social Connections: Socially Integrated (04/20/2022)   Social Connection and Isolation Panel [NHANES]    Frequency of Communication with Friends and Family: More than three times a week    Frequency of Social Gatherings with Friends and Family: More than three times a week    Attends Religious Services: More than 4 times per year    Active Member of [employer] or Organizations: Yes    Attends Archivist Meetings: More than  4 times per year    Marital Status: Married  Human resources officer Violence: Not on file    Family History  Problem Relation Age of Onset   Hyperlipidemia Mother    Hypertension Mother    Stroke Mother    Heart disease Father    Hyperlipidemia Father    Atrial fibrillation Father    Melanoma Father    Arrhythmia Sister        Atrial fibrillation   Heart disease Sister    Atrial fibrillation Sister    Heart attack Brother    Breast cancer Neg Hx      Current Outpatient Medications:    Calcium Carbonate-Vitamin D 600-200 MG-UNIT TABS, Take 1 tablet by mouth daily.  , Disp: , Rfl:    cetirizine (ZYRTEC) 10 MG tablet, Take 10 mg by mouth daily., Disp: , Rfl:    Cholecalciferol 25 MCG (1000 UT) tablet, Take 1,000 Units by mouth daily., Disp: , Rfl:    flecainide (TAMBOCOR) 50  MG tablet, Take 1 tablet by mouth 2 (two) times daily., Disp: , Rfl:    LORazepam (ATIVAN) 0.5 MG tablet, Take 1 tablet (0.5 mg total) by mouth every 6 (six) hours as needed (Nausea or vomiting)., Disp: 30 tablet, Rfl: 0   midodrine (PROAMATINE) 10 MG tablet, Take by mouth., Disp: , Rfl:    mometasone (NASONEX) 50 MCG/ACT nasal spray, Place 2 sprays into the nose daily., Disp: , Rfl:    nystatin cream (MYCOSTATIN), Apply topically 2 (two) times daily., Disp: , Rfl:    OLANZapine (ZYPREXA) 10 MG tablet, TAKE 1 TABLET BY MOUTH EVERYDAY AT BEDTIME, Disp: 90 tablet, Rfl: 1   ondansetron (ZOFRAN) 8 MG tablet, TAKE 1 TABLET BY MOUTH 2 TIMES DAILY AS NEEDED. START ON THE THIRD DAY AFTER CISPLATIN CHEMOTHERAPY., Disp: 18 tablet, Rfl: 3   ondansetron (ZOFRAN-ODT) 4 MG disintegrating tablet, Take 1 tablet (4 mg total) by mouth every 8 (eight) hours as needed for nausea or vomiting. (Patient not taking: Reported on 05/31/2022), Disp: 20 tablet, Rfl: 0   pantoprazole (PROTONIX) 40 MG tablet, Take 40 mg by mouth daily., Disp: , Rfl:    Saccharomyces boulardii (PROBIOTIC) 250 MG CAPS, Take 1 capsule by mouth daily., Disp: , Rfl:    simvastatin (ZOCOR) 20 MG tablet, TAKE 1 TABLET BY MOUTH EVERY EVENING., Disp: 30 tablet, Rfl: 0   traZODone (DESYREL) 50 MG tablet, Take 50 mg by mouth at bedtime as needed., Disp: , Rfl:    Zinc Citrate-Phytase (ZYTAZE) 25-500 MG CAPS, Take 1 Dose by mouth daily., Disp: , Rfl:   Physical exam:  Vitals:   06/14/22 0901  BP: (!) 94/51  Pulse: 75  Resp: 16  Temp: 98 F (36.7 C)  TempSrc: Oral  Weight: 157 lb (71.2 kg)  Height: '5\' 1"'  (1.549 m)   Physical Exam Cardiovascular:     Rate and Rhythm: Normal rate and regular rhythm.     Heart sounds: Normal heart sounds.  Pulmonary:     Effort: Pulmonary effort is normal.  Skin:    General: Skin is warm and dry.  Neurological:     Mental Status: She is alert and oriented to person, place, and time.         Latest Ref Rng  & Units 05/31/2022    8:33 AM  CMP  Glucose 70 - 99 mg/dL 119   BUN 8 - 23 mg/dL 15   Creatinine 0.44 - 1.00 mg/dL 0.77   Sodium 135 - 145  mmol/L 137   Potassium 3.5 - 5.1 mmol/L 3.9   Chloride 98 - 111 mmol/L 106   CO2 22 - 32 mmol/L 25   Calcium 8.9 - 10.3 mg/dL 8.8   Total Protein 6.5 - 8.1 g/dL 6.7   Total Bilirubin 0.3 - 1.2 mg/dL 0.3   Alkaline Phos 38 - 126 U/L 70   AST 15 - 41 U/L 28   ALT 0 - 44 U/L 18       Latest Ref Rng & Units 06/14/2022    8:31 AM  CBC  WBC 4.0 - 10.5 K/uL 3.9   Hemoglobin 12.0 - 15.0 g/dL 10.9   Hematocrit 36.0 - 46.0 % 32.7   Platelets 150 - 400 K/uL 107     Assessment and plan- Patient is a 61 y.o. 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  She is here for on treatment assessment prior to cycle 3 of palliative FOLFOX chemotherapy  Counts okay to proceed with cycle 3 of palliative FOLFOX chemotherapy today.  Her white cell count has been gradually trending down and presently at 3.9 today with an ANC of 1.8.  I am therefore adding day 3 of pump disconnect.  Thrombocytopenia likely secondary to oxaliplatin.  As long as platelet counts 1175 okay to proceed with chemotherapy.  She is already getting regular dose of 65 mg per metered square.  Continue to monitor  Normocytic anemia likely combination of iron deficiency and underlying malignancy.  She has received 5 doses of Venofer and hemoglobin has improved to 10.9 as compared to 8 before.  Continue to monitor   Visit Diagnosis 1. Primary malignant neuroendocrine tumor of esophagus ([place])      Dr. Randa Evens, MD, MPH [facility] at [facility] [phone] 06/14/2022 8:50 AM

## 2022-06-14 NOTE — Patient Instructions (Signed)
[facility]CER [facility] AT [place]  Discharge Instructions: Thank you for choosing [facility] to provide your oncology and hematology care.  If you have a lab appointment with the [facility], please go directly to the [facility] and check in at the registration area.  Wear comfortable clothing and clothing appropriate for easy access to any Portacath or PICC line.   We strive to give you quality time with your provider. You may need to reschedule your appointment if you arrive late (15 or more minutes).  Arriving late affects you and other patients whose appointments are after yours.  Also, if you miss three or more appointments without notifying the office, you may be dismissed from the clinic at the provider's discretion.      For prescription refill requests, have your pharmacy contact our office and allow 72 hours for refills to be completed.    Today you received the following chemotherapy and/or immunotherapy agents 5FU pump, Leucovorin, and Oxaliplatin.      To help prevent nausea and vomiting after your treatment, we encourage you to take your nausea medication as directed.  BELOW ARE SYMPTOMS THAT SHOULD BE REPORTED IMMEDIATELY: *FEVER GREATER THAN 100.4 F (38 C) OR HIGHER *CHILLS OR SWEATING *NAUSEA AND VOMITING THAT IS NOT CONTROLLED WITH YOUR NAUSEA MEDICATION *UNUSUAL SHORTNESS OF BREATH *UNUSUAL BRUISING OR BLEEDING *URINARY PROBLEMS (pain or burning when urinating, or frequent urination) *BOWEL PROBLEMS (unusual diarrhea, constipation, pain near the anus) TENDERNESS IN MOUTH AND THROAT WITH OR WITHOUT PRESENCE OF ULCERS (sore throat, sores in mouth, or a toothache) UNUSUAL RASH, SWELLING OR PAIN  UNUSUAL VAGINAL DISCHARGE OR ITCHING   Items with * indicate a potential emergency and should be followed up as soon as possible or go to the Emergency Department if any problems should occur.  Please show the CHEMOTHERAPY ALERT CARD or  IMMUNOTHERAPY ALERT CARD at check-in to the Emergency Department and triage nurse.  Should you have questions after your visit or need to cancel or reschedule your appointment, please contact [facility]CER [place] AT [place]  [phone] and follow the prompts.  Office hours are 8:00 a.m. to 4:30 p.m. Monday - Friday. Please note that voicemails left after 4:00 p.m. may not be returned until the following business day.  We are closed weekends and major holidays. You have access to a nurse at all times for urgent questions. Please call the main number to the clinic [phone] and follow the prompts.  For any non-urgent questions, you may also contact your provider using MyChart. We now offer e-Visits for anyone 61 and older to request care online for non-urgent symptoms. For details visit [email or web address].   Also download the MyChart app! Go to the app store, search "MyChart", open the app, select [facility], and log in with your MyChart username and password.  Masks are optional in the cancer centers. If you would like for your care team to wear a mask while they are taking care of you, please let them know. For doctor visits, patients may have with them one support person who is at least 61 years old. At this time, visitors are not allowed in the infusion area.

## 2022-06-14 NOTE — Progress Notes (Signed)
Nutrition Follow-up:  Patient with large cell neuroendocrine tumor vs adenocarcinoma of GE junction likely stage IV.  Patient on folfox chemotherapy.    Met with patient and husband during infusion.  Patient reports that her appetite has been good.  Went to [employer] this past weekend and enjoyed eating out.  Having issues with nausea usually day 2 after treatment.  Not having significant dysphagia  Medications: reviewed  Labs: reviewed  Anthropometrics:   Weight 157 lb  151 lb on 9/5 153 lb on 8/29 149 lb on 8/14 153 lb on 7/28 156 lb on 7/19 167 lb on 06/29/21   NUTRITION DIAGNOSIS: Inadequate oral intake improving   INTERVENTION:  Continue high calorie, high protein diet to prevent weight loss    MONITORING, EVALUATION, GOAL: weight trends, intake   NEXT VISIT: Tuesday, Oct 17 during infusion  Arturo Freundlich B. Zenia Resides, [place], [place] Registered Dietitian [phone]

## 2022-06-15 ENCOUNTER — Other Ambulatory Visit: Payer: Self-pay

## 2022-06-16 ENCOUNTER — Inpatient Hospital Stay: Payer: BC Managed Care – PPO

## 2022-06-16 VITALS — BP 129/70 | HR 87 | Temp 97.8°F | Resp 17

## 2022-06-16 DIAGNOSIS — C7A8 Other malignant neuroendocrine tumors: Secondary | ICD-10-CM

## 2022-06-16 DIAGNOSIS — Z5111 Encounter for antineoplastic chemotherapy: Secondary | ICD-10-CM | POA: Diagnosis not present

## 2022-06-16 MED ORDER — SODIUM CHLORIDE 0.9% FLUSH
10.0000 mL | INTRAVENOUS | Status: DC | PRN
  Filled 2022-06-16: qty 10

## 2022-06-16 MED ORDER — HEPARIN SOD (PORK) LOCK FLUSH 100 UNIT/ML IV SOLN
500.0000 [IU] | Freq: Once | INTRAVENOUS | Status: AC | PRN
  Administered 2022-06-16: 500 [IU]
  Filled 2022-06-16: qty 5

## 2022-06-16 MED ORDER — PEGFILGRASTIM-CBQV 6 MG/0.6ML ~~LOC~~ SOSY
6.0000 mg | PREFILLED_SYRINGE | Freq: Once | SUBCUTANEOUS | Status: AC
  Administered 2022-06-16: 6 mg via SUBCUTANEOUS

## 2022-06-22 ENCOUNTER — Ambulatory Visit
Admission: RE | Admit: 2022-06-22 | Discharge: 2022-06-22 | Disposition: A | Discharge status: Home / Self Care | Payer: BC Managed Care – PPO | Source: Ambulatory Visit | Attending: Internal Medicine | Admitting: Internal Medicine

## 2022-06-22 DIAGNOSIS — Z1231 Encounter for screening mammogram for malignant neoplasm of breast: Secondary | ICD-10-CM | POA: Diagnosis not present

## 2022-06-27 MED FILL — Dexamethasone Sodium Phosphate Inj 100 MG/10ML: INTRAMUSCULAR | Qty: 1 | Status: AC

## 2022-06-27 NOTE — Progress Notes (Signed)
Called to speak with patient regarding Grants that she might qualify for, at this time she is over the income limit. Provided her with my number, and told her to reach out if anything changes or if she has any questions or concerns that I could assist her with. She does have someone helping her with her disability application already.

## 2022-06-28 ENCOUNTER — Inpatient Hospital Stay (HOSPITAL_BASED_OUTPATIENT_CLINIC_OR_DEPARTMENT_OTHER): Payer: BC Managed Care – PPO | Admitting: Oncology

## 2022-06-28 ENCOUNTER — Inpatient Hospital Stay: Payer: BC Managed Care – PPO

## 2022-06-28 ENCOUNTER — Encounter: Payer: Self-pay | Admitting: *Deleted

## 2022-06-28 ENCOUNTER — Encounter: Payer: Self-pay | Admitting: Oncology

## 2022-06-28 VITALS — BP 108/71 | HR 97 | Temp 97.5°F | Resp 16 | Wt 158.1 lb

## 2022-06-28 DIAGNOSIS — Z5111 Encounter for antineoplastic chemotherapy: Secondary | ICD-10-CM

## 2022-06-28 DIAGNOSIS — C7A8 Other malignant neuroendocrine tumors: Secondary | ICD-10-CM

## 2022-06-28 DIAGNOSIS — R300 Dysuria: Secondary | ICD-10-CM | POA: Diagnosis not present

## 2022-06-28 DIAGNOSIS — T451X5A Adverse effect of antineoplastic and immunosuppressive drugs, initial encounter: Secondary | ICD-10-CM | POA: Diagnosis not present

## 2022-06-28 DIAGNOSIS — D6959 Other secondary thrombocytopenia: Secondary | ICD-10-CM | POA: Diagnosis not present

## 2022-06-28 DIAGNOSIS — R208 Other disturbances of skin sensation: Secondary | ICD-10-CM

## 2022-06-28 LAB — CBC WITH DIFFERENTIAL/PLATELET
Abs Immature Granulocytes: 0.01 10*3/uL (ref 0.00–0.07)
Basophils Absolute: 0.1 10*3/uL (ref 0.0–0.1)
Basophils Relative: 1 %
Eosinophils Absolute: 0.3 10*3/uL (ref 0.0–0.5)
Eosinophils Relative: 8 %
HCT: 35.9 % — ABNORMAL LOW (ref 36.0–46.0)
Hemoglobin: 12.1 g/dL (ref 12.0–15.0)
Immature Granulocytes: 0 %
Lymphocytes Relative: 38 %
Lymphs Abs: 1.6 10*3/uL (ref 0.7–4.0)
MCH: 33.2 pg (ref 26.0–34.0)
MCHC: 33.7 g/dL (ref 30.0–36.0)
MCV: 98.6 fL (ref 80.0–100.0)
Monocytes Absolute: 0.4 10*3/uL (ref 0.1–1.0)
Monocytes Relative: 10 %
Neutro Abs: 1.8 10*3/uL (ref 1.7–7.7)
Neutrophils Relative %: 43 %
Platelets: 81 10*3/uL — ABNORMAL LOW (ref 150–400)
RBC: 3.64 MIL/uL — ABNORMAL LOW (ref 3.87–5.11)
RDW: 21.6 % — ABNORMAL HIGH (ref 11.5–15.5)
WBC: 4.2 10*3/uL (ref 4.0–10.5)
nRBC: 0 % (ref 0.0–0.2)

## 2022-06-28 LAB — COMPREHENSIVE METABOLIC PANEL
ALT: 30 U/L (ref 0–44)
AST: 42 U/L — ABNORMAL HIGH (ref 15–41)
Albumin: 3.8 g/dL (ref 3.5–5.0)
Alkaline Phosphatase: 88 U/L (ref 38–126)
Anion gap: 7 (ref 5–15)
BUN: 6 mg/dL — ABNORMAL LOW (ref 8–23)
CO2: 24 mmol/L (ref 22–32)
Calcium: 8.8 mg/dL — ABNORMAL LOW (ref 8.9–10.3)
Chloride: 108 mmol/L (ref 98–111)
Creatinine, Ser: 0.74 mg/dL (ref 0.44–1.00)
GFR, Estimated: >60 mL/min (ref >60)
Glucose, Bld: 135 mg/dL — ABNORMAL HIGH (ref 70–99)
Potassium: 3.4 mmol/L — ABNORMAL LOW (ref 3.5–5.1)
Sodium: 139 mmol/L (ref 135–145)
Total Bilirubin: 0.7 mg/dL (ref 0.3–1.2)
Total Protein: 7.1 g/dL (ref 6.5–8.1)

## 2022-06-28 LAB — URINALYSIS, COMPLETE (UACMP) WITH MICROSCOPIC
Bacteria, UA: NONE SEEN
Bilirubin Urine: NEGATIVE
Glucose, UA: NEGATIVE mg/dL
Hgb urine dipstick: NEGATIVE
Ketones, ur: NEGATIVE mg/dL
Leukocytes,Ua: NEGATIVE
Nitrite: NEGATIVE
Protein, ur: NEGATIVE mg/dL
Specific Gravity, Urine: 1.018 (ref 1.005–1.030)
pH: 5 (ref 5.0–8.0)

## 2022-06-28 MED ORDER — DEXTROSE 5 % IV SOLN
Freq: Once | INTRAVENOUS | Status: AC
  Filled 2022-06-28: qty 250

## 2022-06-28 MED ORDER — FLUOROURACIL CHEMO INJECTION 2.5 GM/50ML
400.0000 mg/m2 | Freq: Once | INTRAVENOUS | Status: AC
  Administered 2022-06-28: 650 mg via INTRAVENOUS
  Filled 2022-06-28: qty 13

## 2022-06-28 MED ORDER — OXALIPLATIN CHEMO INJECTION 100 MG/20ML
65.0000 mg/m2 | Freq: Once | INTRAVENOUS | Status: AC
  Administered 2022-06-28: 110 mg via INTRAVENOUS
  Filled 2022-06-28: qty 10

## 2022-06-28 MED ORDER — POTASSIUM CHLORIDE IN NACL 20-0.9 MEQ/L-% IV SOLN
Freq: Once | INTRAVENOUS | Status: AC
  Filled 2022-06-28: qty 1000

## 2022-06-28 MED ORDER — SODIUM CHLORIDE 0.9 % IV SOLN
10.0000 mg | Freq: Once | INTRAVENOUS | Status: AC
  Administered 2022-06-28: 10 mg via INTRAVENOUS
  Filled 2022-06-28: qty 10

## 2022-06-28 MED ORDER — SODIUM CHLORIDE 0.9 % IV SOLN
2400.0000 mg/m2 | INTRAVENOUS | Status: DC
  Administered 2022-06-28: 4000 mg via INTRAVENOUS
  Filled 2022-06-28: qty 80

## 2022-06-28 MED ORDER — PALONOSETRON HCL INJECTION 0.25 MG/5ML
0.2500 mg | Freq: Once | INTRAVENOUS | Status: AC
  Administered 2022-06-28: 0.25 mg via INTRAVENOUS
  Filled 2022-06-28: qty 5

## 2022-06-28 MED ORDER — HEPARIN SOD (PORK) LOCK FLUSH 100 UNIT/ML IV SOLN
500.0000 [IU] | Freq: Once | INTRAVENOUS | Status: DC | PRN
  Filled 2022-06-28: qty 5

## 2022-06-28 MED ORDER — HEPARIN SOD (PORK) LOCK FLUSH 100 UNIT/ML IV SOLN
INTRAVENOUS | Status: AC
  Filled 2022-06-28: qty 5

## 2022-06-28 MED ORDER — LEUCOVORIN CALCIUM INJECTION 350 MG
650.0000 mg | Freq: Once | INTRAVENOUS | Status: AC
  Administered 2022-06-28: 650 mg via INTRAVENOUS
  Filled 2022-06-28: qty 32.5

## 2022-06-28 NOTE — Progress Notes (Signed)
Nutrition Follow-up:  Patient with large cell neuroendocrine tumor vs adenocarcinoma of GE junction likely stage IV.  Patient on folfox chemotherapy.    Met with patient and husband during infusion.  Patient reports that she is eating well. Not drinking protein shakes but eating protein bars.      Medications: reviewed  Labs: reviewed  Anthropometrics:   Weight 158 lb 1.6 oz today  157 lb on 10/3 153 lb on 8/29 149 lb on 8/14 153 lb on 7/28 156 lb on 7/19 167 lb on 06/29/21   NUTRITION DIAGNOSIS: Inadequate oral intake improving   INTERVENTION:  Encouraged adding protein at every meal/snack.  Reviewed foods that are high in protein.      MONITORING, EVALUATION, GOAL: weight trends, intake   NEXT VISIT: Tuesday, Nov 14 phone call or see in infusion  Aymen Widrig B. Zenia Resides, [place], [place] Registered Dietitian [phone]

## 2022-06-28 NOTE — Progress Notes (Signed)
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 Haig Prophet on 03/01/2022 EGD showed a medium-sized ulcerating mass with no bleeding or stigmata of recent bleeding at the GE junction..  Mass was partially obstructing.  There is also mention of another ulcerated noncircumferential mass with oozing bleeding found at the GE junction with extension into the cardia.  Both these masses were biopsied.  Pathology was pending at the time of my visit with the patient and was reported to be a poorly differentiated carcinoma.  Patient is currently able to swallow soft foods without much difficulty.  However more solid foods occasionally feel stuck.    PET was denied by insurance.  CT chest abdomen and pelvis with contrast showed asymmetric distal esophageal wall thickening with thickening extending around the lesser curvature of the stomach through the gastric cardia and proximal fundus.  Paratracheal/paraesophageal, gastrohepatic and PET retroperitoneal adenopathy.  Left periaortic lymph node measuring 17 mm at the level of left renal hilum.   Final pathology showed there were 2 specimens collected.  Specimen a was poorly differentiated carcinoma with ulceration and necrosis.v verys weakly positive for synaptophysin 50% of the cells.  Cells negative for CK7 and CK23 for the BF1 CDX2 100 Melan-A HMB45  and CD20.  Specimen B showed large cell neuroendocrine carcinoma with neoplastic cells positive for CD56 with diffuse strong staining and majority of the neoplastic cells were also positive for synaptophysin with weak staining Ki-67 95%   Patient went to [facility] for second opinion.  Pathology was also looked at but was signed out as poorly differentiated adenocarcinoma at [facility].  Plan was to see if patient responds to cis etoposide Tecentriq regimen and if there is no adequate response switch her to FOLFOX    Interval history-patient is tolerating chemotherapy well so far.  Denies any significant nausea or vomiting.  Denies any blood loss in her stool or urine.  She has been having some symptoms of dysuria over the last 1 day.  ECOG PS- 1 Pain scale- 0   Review of systems- Review of Systems  Constitutional:  Positive for malaise/fatigue. Negative for chills, fever and weight loss.  HENT:  Negative for congestion, ear discharge and nosebleeds.   Eyes:  Negative for blurred vision.  Respiratory:  Negative for cough, hemoptysis, sputum production, shortness of breath and wheezing.   Cardiovascular:  Negative for chest pain, palpitations, orthopnea and claudication.  Gastrointestinal:  Negative for abdominal pain, blood in stool, constipation, diarrhea, heartburn, melena, nausea and vomiting.  Genitourinary:  Positive for dysuria. Negative for flank pain, frequency, hematuria and urgency.  Musculoskeletal:  Negative for back pain, joint pain and myalgias.  Skin:  Negative for rash.  Neurological:  Negative for dizziness, tingling, focal weakness, seizures, weakness and headaches.  Endo/Heme/Allergies:  Does not bruise/bleed easily.  Psychiatric/Behavioral:  Negative for depression and suicidal ideas. The patient does not have insomnia.       Allergies  Allergen  Reactions   Sulfa Antibiotics Hives    Says her tongue swelled a bit   Sulfonamide Derivatives Swelling    Tongue swelling      Past Medical History:  Diagnosis Date   Anemia    Arthritis    Atrial fibrillation ([place])    B12 deficiency    Dysrhythmia    Hyperlipidemia    Hypothyroidism    Menopause    Palpitations    Hx of, 25 years ago   Vertigo      Past Surgical History:  Procedure Laterality Date   BILATERAL SALPINGOOPHORECTOMY  2008   DILATION AND CURETTAGE OF UTERUS  08/24/2006   ESOPHAGOGASTRODUODENOSCOPY (EGD) WITH PROPOFOL N/A 03/01/2022   Procedure: ESOPHAGOGASTRODUODENOSCOPY (EGD) WITH PROPOFOL;  Surgeon: Lesly Rubenstein, MD;  Location: [facility] ENDOSCOPY;  Service: Endoscopy;  Laterality: N/A;   ESOPHAGOGASTRODUODENOSCOPY (EGD) WITH PROPOFOL N/A 05/04/2022   Procedure: ESOPHAGOGASTRODUODENOSCOPY (EGD) WITH PROPOFOL;  Surgeon: [name], MD;  Location: [facility] ENDOSCOPY;  Service: Gastroenterology;  Laterality: N/A;   incision tendon sheath for trigger finger Right    ring finger   LAPAROSCOPIC SUPRACERVICAL HYSTERECTOMY  2008   Dr. Ammie Dalton; with BSO   NOSE SURGERY  03/2008   PORTA CATH INSERTION N/A 03/21/2022   Procedure: PORTA CATH INSERTION;  Surgeon: Algernon Huxley, MD;  Location: [place] [facility];  Service: Cardiovascular;  Laterality: N/A;    Social History   Socioeconomic History   Marital status: Married    Spouse name: Not on file   Number of children: Not on file   Years of education: Not on file   Highest education level: Not on file  Occupational History   Occupation: Full time  Tobacco Use   Smoking status: Never   Smokeless tobacco: Never  Vaping Use   Vaping Use: Never used  Substance and Sexual Activity   Alcohol use: Never   Drug use: Never   Sexual activity: Not Currently    Birth control/protection: Surgical    Comment: Hysterectomy  Other Topics Concern   Not on file  Social History Narrative   Married with 2 children   Gets regular exercise   Social Determinants of Health   Financial Resource Strain: Low Risk  (04/20/2022)   Overall Financial  Resource Strain (CARDIA)    Difficulty of Paying Living Expenses: Not very hard  Food Insecurity: No Food Insecurity (04/20/2022)   Hunger Vital Sign    Worried About Running Out of Food in the Last Year: Never true    Ran Out of Food in the Last Year: Never true  Transportation Needs: No Transportation Needs (04/20/2022)   PRAPARE - Hydrologist (Medical): No    Lack of Transportation (Non-Medical): No  Physical Activity: Inactive (04/20/2022)   Exercise Vital Sign    Days of Exercise per Week: 0 days    Minutes of Exercise per Session: 0 min  Stress: No Stress Concern Present (04/20/2022)   Friendship    Feeling of Stress : Only a little  Social Connections: Socially Integrated (04/20/2022)   Social Connection and Isolation Panel [NHANES]    Frequency of Communication with Friends and Family: More than three times a week    Frequency of Social Gatherings with Friends and Family: More than three times a week    Attends Religious Services: More than 4 times per year    Active Member of Clubs or Organizations: Yes  Attends Music therapist: More than 4 times per year    Marital Status: Married  Human resources officer Violence: Not on file    Family History  Problem Relation Age of Onset   Hyperlipidemia Mother    Hypertension Mother    Stroke Mother    Heart disease Father    Hyperlipidemia Father    Atrial fibrillation Father    Melanoma Father    Arrhythmia Sister        Atrial fibrillation   Heart disease Sister    Atrial fibrillation Sister    Heart attack Brother    Breast cancer Neg Hx      Current Outpatient Medications:    Calcium Carbonate-Vitamin D 600-200 MG-UNIT TABS, Take 1 tablet by mouth daily.  , Disp: , Rfl:    cetirizine (ZYRTEC) 10 MG tablet, Take 10 mg by mouth daily., Disp: , Rfl:    Cholecalciferol 25 MCG (1000 UT) tablet, Take 1,000 Units by mouth daily.,  Disp: , Rfl:    flecainide (TAMBOCOR) 50 MG tablet, Take 1 tablet by mouth 2 (two) times daily., Disp: , Rfl:    fluconazole (DIFLUCAN) 150 MG tablet, Take 150 mg by mouth once., Disp: , Rfl:    LORazepam (ATIVAN) 0.5 MG tablet, Take 1 tablet (0.5 mg total) by mouth every 6 (six) hours as needed (Nausea or vomiting)., Disp: 30 tablet, Rfl: 0   midodrine (PROAMATINE) 10 MG tablet, Take by mouth., Disp: , Rfl:    mometasone (NASONEX) 50 MCG/ACT nasal spray, Place 2 sprays into the nose daily., Disp: , Rfl:    nystatin cream (MYCOSTATIN), Apply topically 2 (two) times daily., Disp: , Rfl:    OLANZapine (ZYPREXA) 10 MG tablet, TAKE 1 TABLET BY MOUTH EVERYDAY AT BEDTIME, Disp: 90 tablet, Rfl: 1   ondansetron (ZOFRAN) 8 MG tablet, TAKE 1 TABLET BY MOUTH 2 TIMES DAILY AS NEEDED. START ON THE THIRD DAY AFTER CISPLATIN CHEMOTHERAPY., Disp: 18 tablet, Rfl: 3   ondansetron (ZOFRAN-ODT) 4 MG disintegrating tablet, Take 1 tablet (4 mg total) by mouth every 8 (eight) hours as needed for nausea or vomiting., Disp: 20 tablet, Rfl: 0   pantoprazole (PROTONIX) 40 MG tablet, Take 40 mg by mouth daily., Disp: , Rfl:    Saccharomyces boulardii (PROBIOTIC) 250 MG CAPS, Take 1 capsule by mouth daily., Disp: , Rfl:    simvastatin (ZOCOR) 20 MG tablet, TAKE 1 TABLET BY MOUTH EVERY EVENING., Disp: 30 tablet, Rfl: 0   traZODone (DESYREL) 50 MG tablet, Take 50 mg by mouth at bedtime as needed., Disp: , Rfl:    Zinc Citrate-Phytase (ZYTAZE) 25-500 MG CAPS, Take 1 Dose by mouth daily., Disp: , Rfl:  No current facility-administered medications for this visit.  Facility-Administered Medications Ordered in Other Visits:    0.9 % NaCl with KCl 20 mEq/ L  infusion, , Intravenous, Once, Sindy Guadeloupe, MD, Last Rate: 999 mL/hr at 06/28/22 1016, New Bag at 06/28/22 1016   fluorouracil (ADRUCIL) 4,000 mg in sodium chloride 0.9 % 70 mL chemo infusion, 2,400 mg/m2 (Treatment Plan Recorded), Intravenous, 1 day or 1 dose, Sindy Guadeloupe,  MD   fluorouracil (ADRUCIL) chemo injection 650 mg, 400 mg/m2 (Treatment Plan Recorded), Intravenous, Once, Sindy Guadeloupe, MD   heparin lock flush 100 UNIT/ML injection, , , ,    heparin lock flush 100 unit/mL, 500 Units, Intracatheter, Once PRN, Sindy Guadeloupe, MD   leucovorin 650 mg in dextrose 5 % 250 mL infusion, 650 mg, Intravenous,  Once, Sindy Guadeloupe, MD   oxaliplatin (ELOXATIN) 110 mg in dextrose 5 % 500 mL chemo infusion, 65 mg/m2 (Treatment Plan Recorded), Intravenous, Once, Sindy Guadeloupe, MD  Physical exam:  Vitals:   06/28/22 0929  BP: 108/71  Pulse: 97  Resp: 16  Temp: (!) 97.5 F (36.4 C)  SpO2: 100%  Weight: 158 lb 1.6 oz (71.7 kg)   Physical Exam Constitutional:      General: She is not in acute distress. Cardiovascular:     Rate and Rhythm: Normal rate and regular rhythm.     Heart sounds: Normal heart sounds.  Pulmonary:     Effort: Pulmonary effort is normal.     Breath sounds: Normal breath sounds.  Skin:    General: Skin is warm and dry.  Neurological:     Mental Status: She is alert and oriented to person, place, and time.         Latest Ref Rng & Units 06/28/2022    9:12 AM  CMP  Glucose 70 - 99 mg/dL 135   BUN 8 - 23 mg/dL 6   Creatinine 0.44 - 1.00 mg/dL 0.74   Sodium 135 - 145 mmol/L 139   Potassium 3.5 - 5.1 mmol/L 3.4   Chloride 98 - 111 mmol/L 108   CO2 22 - 32 mmol/L 24   Calcium 8.9 - 10.3 mg/dL 8.8   Total Protein 6.5 - 8.1 g/dL 7.1   Total Bilirubin 0.3 - 1.2 mg/dL 0.7   Alkaline Phos 38 - 126 U/L 88   AST 15 - 41 U/L 42   ALT 0 - 44 U/L 30       Latest Ref Rng & Units 06/28/2022    9:12 AM  CBC  WBC 4.0 - 10.5 K/uL 4.2   Hemoglobin 12.0 - 15.0 g/dL 12.1   Hematocrit 36.0 - 46.0 % 35.9   Platelets 150 - 400 K/uL 81     No images are attached to the encounter.  MM 3D SCREEN BREAST BILATERAL  Result Date: 06/22/2022 CLINICAL DATA:  Screening. EXAM: DIGITAL SCREENING BILATERAL MAMMOGRAM WITH TOMOSYNTHESIS AND CAD  TECHNIQUE: Bilateral screening digital craniocaudal and mediolateral oblique mammograms were obtained. Bilateral screening digital breast tomosynthesis was performed. The images were evaluated with computer-aided detection. COMPARISON:  Previous exam(s). ACR Breast Density Category b: There are scattered areas of fibroglandular density. FINDINGS: There are no findings suspicious for malignancy. IMPRESSION: No mammographic evidence of malignancy. A result letter of this screening mammogram will be mailed directly to the patient. RECOMMENDATION: Screening mammogram in one year. (Code:SM-B-01Y) BI-RADS CATEGORY  1: Negative. Electronically Signed   By: Fidela Salisbury M.D.   On: 06/22/2022 17:12     Assessment and plan- Patient is a 61 y.o. 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  She is here for on treatment assessment prior to cycle 4 of palliative FOLFOX chemotherapy  Counts okay to proceed with cycle 4 of palliative FOLFOX chemotherapy.  She has been developing progressive thrombocytopenia with this regimen likely secondary to oxaliplatin.  As long as platelet counts are more than 75 okay to do chemotherapy.  She is already at a lower dose of oxaliplatin at 65 mg per metered square.  If her platelet count is any lower in 2 weeks I will have to delay her treatment and hold off on giving her oxaliplatin from time to time.  Since 5-FU was an important component of her treatment I do not plan  to dose reduce that at this time.  Hemoglobin is improved after starting this regimen.  She continues to receive Udenyca on day 3 of pump disconnect.  I will see her back in 2 weeks for cycle 5.  Plan to repeat scans after 6 cycles.  Dysuria:Urinalysis was checked today and was negative for leukocyte esterase and nitrites.  I am therefore holding off on starting any empiric antibiotics at this time.  We will follow-up on urine cultures   Visit Diagnosis 1. Dysuria   2.  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 Encounter for antineoplastic chemotherapy   4. Chemotherapy-induced thrombocytopenia      Dr. Randa Evens, MD, MPH [facility] at [facility] [phone] 06/28/2022 11:15 AM

## 2022-06-28 NOTE — Patient Instructions (Signed)
[facility]  Discharge Instructions: Thank you for choosing [facility] to provide your oncology and hematology care.  If you have a lab appointment with the Cancer Center, please go directly to the Cancer Center and check in at the registration area.  Wear comfortable clothing and clothing appropriate for easy access to any Portacath or PICC line.   We strive to give you quality time with your provider. You may need to reschedule your appointment if you arrive late (15 or more minutes).  Arriving late affects you and other patients whose appointments are after yours.  Also, if you miss three or more appointments without notifying the office, you may be dismissed from the clinic at the provider's discretion.      For prescription refill requests, have your pharmacy contact our office and allow 72 hours for refills to be completed.    Today you received the following chemotherapy and/or immunotherapy agents Eloxatin, Leucovorin & Adrucil      To help prevent nausea and vomiting after your treatment, we encourage you to take your nausea medication as directed.  BELOW ARE SYMPTOMS THAT SHOULD BE REPORTED IMMEDIATELY: *FEVER GREATER THAN 100.4 F (38 C) OR HIGHER *CHILLS OR SWEATING *NAUSEA AND VOMITING THAT IS NOT CONTROLLED WITH YOUR NAUSEA MEDICATION *UNUSUAL SHORTNESS OF BREATH *UNUSUAL BRUISING OR BLEEDING *URINARY PROBLEMS (pain or burning when urinating, or frequent urination) *BOWEL PROBLEMS (unusual diarrhea, constipation, pain near the anus) TENDERNESS IN MOUTH AND THROAT WITH OR WITHOUT PRESENCE OF ULCERS (sore throat, sores in mouth, or a toothache) UNUSUAL RASH, SWELLING OR PAIN  UNUSUAL VAGINAL DISCHARGE OR ITCHING   Items with * indicate a potential emergency and should be followed up as soon as possible or go to the Emergency Department if any problems should occur.  Please show the CHEMOTHERAPY ALERT CARD or IMMUNOTHERAPY ALERT  CARD at check-in to the Emergency Department and triage nurse.  Should you have questions after your visit or need to cancel or reschedule your appointment, please contact [facility]  [phone] and follow the prompts.  Office hours are 8:00 a.m. to 4:30 p.m. Monday - Friday. Please note that voicemails left after 4:00 p.m. may not be returned until the following business day.  We are closed weekends and major holidays. You have access to a nurse at all times for urgent questions. Please call the main number to the clinic [phone] and follow the prompts.  For any non-urgent questions, you may also contact your provider using MyChart. We now offer e-Visits for anyone 18 and older to request care online for non-urgent symptoms. For details visit [email or web address].   Also download the MyChart app! Go to the app store, search "MyChart", open the app, select [facility], and log in with your MyChart username and password.  Masks are optional in the cancer centers. If you would like for your care team to wear a mask while they are taking care of you, please let them know. For doctor visits, patients may have with them one support person who is at least 61 years old. At this time, visitors are not allowed in the infusion area.   

## 2022-06-28 NOTE — Progress Notes (Signed)
Plt 81 ok to proceed per MD

## 2022-06-28 NOTE — Progress Notes (Signed)
  Pt had a one time dose of DIFLUCAN yesterday believes it is a yeast, pt is experiencing burning sensation while urinating states sxs started yesterday (06/27/2022)Will enter a UA and urine culture. . Pt will like to discuss additional scan to track progress.

## 2022-06-28 NOTE — Progress Notes (Signed)
Per MD Janese Banks, ok to infuse over 1 hr through port

## 2022-06-29 ENCOUNTER — Other Ambulatory Visit: Payer: Self-pay | Admitting: *Deleted

## 2022-06-29 ENCOUNTER — Other Ambulatory Visit: Payer: Self-pay

## 2022-06-29 MED ORDER — ONDANSETRON 4 MG PO TBDP: 4.0000 mg | ORAL_TABLET | Freq: Three times a day (TID) | ORAL | 3 refills | Status: DC | PRN

## 2022-06-30 ENCOUNTER — Inpatient Hospital Stay: Payer: BC Managed Care – PPO

## 2022-06-30 VITALS — BP 111/67 | HR 71 | Temp 97.8°F | Resp 16

## 2022-06-30 DIAGNOSIS — C7A8 Other malignant neuroendocrine tumors: Secondary | ICD-10-CM

## 2022-06-30 DIAGNOSIS — Z5111 Encounter for antineoplastic chemotherapy: Secondary | ICD-10-CM | POA: Diagnosis not present

## 2022-06-30 LAB — URINE CULTURE: Culture: 30000 — AB

## 2022-06-30 MED ORDER — HEPARIN SOD (PORK) LOCK FLUSH 100 UNIT/ML IV SOLN
500.0000 [IU] | Freq: Once | INTRAVENOUS | Status: AC | PRN
  Administered 2022-06-30: 500 [IU]
  Filled 2022-06-30: qty 5

## 2022-06-30 MED ORDER — PEGFILGRASTIM-CBQV 6 MG/0.6ML ~~LOC~~ SOSY
6.0000 mg | PREFILLED_SYRINGE | Freq: Once | SUBCUTANEOUS | Status: AC
  Administered 2022-06-30: 6 mg via SUBCUTANEOUS
  Filled 2022-06-30: qty 0.6

## 2022-06-30 MED ORDER — SODIUM CHLORIDE 0.9% FLUSH
10.0000 mL | INTRAVENOUS | Status: DC | PRN
  Administered 2022-06-30: 10 mL
  Filled 2022-06-30: qty 10

## 2022-07-01 ENCOUNTER — Other Ambulatory Visit: Payer: Self-pay

## 2022-07-01 ENCOUNTER — Emergency Department
Admission: EM | Admit: 2022-07-01 | Discharge: 2022-07-01 | Disposition: A | Discharge status: Home / Self Care | Payer: BC Managed Care – PPO | Attending: Emergency Medicine | Admitting: Emergency Medicine

## 2022-07-01 ENCOUNTER — Emergency Department: Payer: BC Managed Care – PPO

## 2022-07-01 DIAGNOSIS — I10 Essential (primary) hypertension: Secondary | ICD-10-CM | POA: Insufficient documentation

## 2022-07-01 DIAGNOSIS — Z8501 Personal history of malignant neoplasm of esophagus: Secondary | ICD-10-CM | POA: Insufficient documentation

## 2022-07-01 DIAGNOSIS — D72829 Elevated white blood cell count, unspecified: Secondary | ICD-10-CM | POA: Diagnosis not present

## 2022-07-01 DIAGNOSIS — E039 Hypothyroidism, unspecified: Secondary | ICD-10-CM | POA: Insufficient documentation

## 2022-07-01 DIAGNOSIS — Z79899 Other long term (current) drug therapy: Secondary | ICD-10-CM | POA: Diagnosis not present

## 2022-07-01 DIAGNOSIS — R002 Palpitations: Secondary | ICD-10-CM | POA: Insufficient documentation

## 2022-07-01 DIAGNOSIS — N390 Urinary tract infection, site not specified: Secondary | ICD-10-CM

## 2022-07-01 LAB — CBC WITH DIFFERENTIAL/PLATELET
Abs Immature Granulocytes: 2.68 10*3/uL — ABNORMAL HIGH (ref 0.00–0.07)
Basophils Absolute: 0.2 10*3/uL — ABNORMAL HIGH (ref 0.0–0.1)
Basophils Relative: 1 %
Eosinophils Absolute: 0 10*3/uL (ref 0.0–0.5)
Eosinophils Relative: 0 %
HCT: 36.8 % (ref 36.0–46.0)
Hemoglobin: 12.1 g/dL (ref 12.0–15.0)
Immature Granulocytes: 8 %
Lymphocytes Relative: 4 %
Lymphs Abs: 1.2 10*3/uL (ref 0.7–4.0)
MCH: 32.8 pg (ref 26.0–34.0)
MCHC: 32.9 g/dL (ref 30.0–36.0)
MCV: 99.7 fL (ref 80.0–100.0)
Monocytes Absolute: 0.3 10*3/uL (ref 0.1–1.0)
Monocytes Relative: 1 %
Neutro Abs: 29.3 10*3/uL — ABNORMAL HIGH (ref 1.7–7.7)
Neutrophils Relative %: 86 %
Platelets: 79 10*3/uL — ABNORMAL LOW (ref 150–400)
RBC: 3.69 MIL/uL — ABNORMAL LOW (ref 3.87–5.11)
RDW: 21.5 % — ABNORMAL HIGH (ref 11.5–15.5)
Smear Review: NORMAL
WBC: 33.7 10*3/uL — ABNORMAL HIGH (ref 4.0–10.5)
nRBC: 0 % (ref 0.0–0.2)

## 2022-07-01 LAB — URINALYSIS, ROUTINE W REFLEX MICROSCOPIC
Bilirubin Urine: NEGATIVE
Glucose, UA: NEGATIVE mg/dL
Hgb urine dipstick: NEGATIVE
Ketones, ur: NEGATIVE mg/dL
Nitrite: NEGATIVE
Protein, ur: 30 mg/dL — AB
Specific Gravity, Urine: 1.023 (ref 1.005–1.030)
WBC, UA: >50 WBC/hpf — ABNORMAL HIGH (ref 0–5)
pH: 5 (ref 5.0–8.0)

## 2022-07-01 LAB — BASIC METABOLIC PANEL
Anion gap: 8 (ref 5–15)
BUN: 21 mg/dL (ref 8–23)
CO2: 25 mmol/L (ref 22–32)
Calcium: 8.9 mg/dL (ref 8.9–10.3)
Chloride: 102 mmol/L (ref 98–111)
Creatinine, Ser: 0.84 mg/dL (ref 0.44–1.00)
GFR, Estimated: >60 mL/min (ref >60)
Glucose, Bld: 97 mg/dL (ref 70–99)
Potassium: 4.1 mmol/L (ref 3.5–5.1)
Sodium: 135 mmol/L (ref 135–145)

## 2022-07-01 LAB — PROCALCITONIN: Procalcitonin: 0.14 ng/mL

## 2022-07-01 LAB — LACTIC ACID, PLASMA: Lactic Acid, Venous: 2 mmol/L (ref 0.5–1.9)

## 2022-07-01 LAB — MAGNESIUM: Magnesium: 2.2 mg/dL (ref 1.7–2.4)

## 2022-07-01 LAB — TROPONIN I (HIGH SENSITIVITY): Troponin I (High Sensitivity): 13 ng/L (ref <18)

## 2022-07-01 MED ORDER — LACTATED RINGERS IV BOLUS: 1000.0000 mL | Freq: Once | INTRAVENOUS | Status: DC

## 2022-07-01 MED ORDER — DOXYCYCLINE MONOHYDRATE 100 MG PO TABS: 100.0000 mg | ORAL_TABLET | Freq: Two times a day (BID) | ORAL | 0 refills | Status: AC

## 2022-07-01 MED ORDER — SODIUM CHLORIDE 0.9 % IV SOLN
1.0000 g | Freq: Once | INTRAVENOUS | Status: AC
  Administered 2022-07-01: 1 g via INTRAVENOUS
  Filled 2022-07-01: qty 10

## 2022-07-01 MED ORDER — DOXYCYCLINE HYCLATE 100 MG PO TABS
100.0000 mg | ORAL_TABLET | Freq: Once | ORAL | Status: AC
  Administered 2022-07-01: 100 mg via ORAL
  Filled 2022-07-01: qty 1

## 2022-07-01 MED ORDER — DOXYCYCLINE MONOHYDRATE 100 MG PO TABS: 100.0000 mg | ORAL_TABLET | Freq: Two times a day (BID) | ORAL | 0 refills | Status: DC

## 2022-07-01 NOTE — Discharge Instructions (Addendum)
You are seen in the emergency department for palpitations.  It was recommended you be admitted to the hospital for antibiotics for urinary tract infection.  After discussing with the infectious disease pharmacist recommended starting you on doxycycline.  Call your oncologist first thing in the morning to let them know that you are seen in the emergency department.  Return to the emergency department for any worsening symptoms.  Doxycycline - This medication can cause acid reflux.  It is important that you take it with food and drink plenty of water.  Do not lie down for 1 hour after taking this medication.  It also causes sun sensitivity so stay out of the sun or wear SPF while on this medication.

## 2022-07-01 NOTE — ED Notes (Signed)
First Nurse Note: Pt to ED via [facility] from home for irregular heartbeat. Per EMS they were called out for heart problems. Pt has hx/o A. FIB and took 1 25 mg Metoprolol and 2 12.5 mg metoprolol prior to ems arrival. When EMS arrived pts heart rate was 180-220, initial BP was 77/53. Pt was given 500 CC of fluids and self converted to NSR. Pt has remained in NSR since then. Pt BP improved to 117/60, SpO2 98% on room.  Pt is cancer pt, pt just finished her chemo treatments.

## 2022-07-01 NOTE — ED Provider Triage Note (Signed)
Emergency Medicine Provider Triage Evaluation Note  Ariana Wilson , a 61 y.o. female  was evaluated in triage.  Pt reports that she is currently undergoing chemotherapy and is often nauseous and today was dry heaving when she thinks that she went into atrial fibrillation.  She said that her smart watch noted that she was at 200 bpm.  She called EMS and prior to their arrival she took 2 metoprolol and 2 flecainide.  She reports that she spontaneously converted and now she feels back to normal.  No chest pain or shortness of breath.  No syncope.  Patient Active Problem List   Diagnosis Date Noted   GI bleeding 05/03/2022   Hypovolemic shock ([place]) 05/03/2022   Hypotension 04/14/2022   Febrile neutropenia ([place]) 04/13/2022   Iron deficiency anemia 03/31/2022   Malignant tumor of lower third of esophagus ([place]) 03/24/2022   Primary malignant neuroendocrine tumor of esophagus ([place]) 03/20/2022   Bursitis of hip 07/15/2020   History of 2019 novel coronavirus disease (COVID-19) 06/15/2020   Chronic vaginitis 01/06/2020   Acute cystitis 12/07/2018   Vertigo 12/07/2018   Developmental venous anomaly 12/06/2018   Encounter for anticoagulation discussion and counseling 01/29/2018   Essential hypertension 01/27/2018   B12 deficiency 07/04/2017   Tubular adenoma 07/04/2017   Morbid obesity ([place]) 10/04/2016   Primary osteoarthritis of hand 03/08/2016   Family history of coronary artery disease 02/29/2016   Anemia    Menopause    Hyperlipemia 03/07/2011   ATRIAL FIBRILLATION 02/14/2009   Palpitations 02/14/2009   .  Review of Systems  Positive: N/V, Palpitations, resolved Negative: Cp, dizzy, abd pain, fever  Physical Exam  There were no vitals taken for this visit. Gen:   Awake, no distress   Resp:  Normal effort  MSK:   Moves extremities without difficulty  Other:    Medical Decision Making  Medically screening exam initiated at 11:28 AM.  Appropriate orders placed.  Ariana Wilson was  informed that the remainder of the evaluation will be completed by another provider, this initial triage assessment does not replace that evaluation, and the importance of remaining in the ED until their evaluation is complete.     Marquette Old, PA-C 07/01/22 1146

## 2022-07-01 NOTE — ED Provider Notes (Signed)
[facility] Provider Note    Event Date/Time   First MD Initiated Contact with Patient 07/01/22 1238     (approximate)   History   Palpitations (Pt. To ED via EMS for rapid heart rate up to 200. Pt. States she had chemo today, was feeling ill and vomiting after which she says is normal for her, but felt herself go into A-fib. Pt. Has hx of a-fib. Pt. Took metoprolol and flecainide at home, and Received fluids en route via EMS and converted back to SR prior to hospital arrival. Pt. Currently has no complaints.)   HPI  Ariana Wilson is a 61 y.o. female medical history of A-fib on metoprolol and flecainide hyperlipidemia hypothyroidism who presents with palpitations.  Patient was feeling nauseous this morning then felt like her heart rate increased and went to A-fib.  She took her p.o. metoprolol which she only takes as needed and flecainide.  Called EMS apparently heart rate was in the 200s and during route she converted to normal sinus rhythm.  She felt somewhat short of breath during the episode but now feels fine.  She is on palliative chemotherapy for esophageal cancer.  Finished chemo on Thursday and got a dose of filgrastim yesterday.  Denies any urinary symptoms fevers chills shortness of breath or abdominal pain.  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   Bursitis of hip 07/15/2020   History  of 2019 novel coronavirus disease (COVID-19) 06/15/2020   Chronic vaginitis 01/06/2020   Acute cystitis 12/07/2018   Vertigo 12/07/2018   Developmental venous anomaly 12/06/2018   Encounter for anticoagulation discussion and counseling 01/29/2018   Essential hypertension 01/27/2018   B12 deficiency 07/04/2017   Tubular adenoma 07/04/2017   Morbid obesity ([place]) 10/04/2016   Primary osteoarthritis of hand 03/08/2016   Family history of coronary artery disease 02/29/2016   Anemia    Menopause    Hyperlipemia 03/07/2011   ATRIAL FIBRILLATION 02/14/2009   Palpitations 02/14/2009     Physical Exam  Triage Vital Signs: ED Triage Vitals  Enc Vitals Group     BP 07/01/22 1146 (!) 95/54     Pulse Rate 07/01/22 1146 97     Resp 07/01/22 1146 16     Temp 07/01/22 1146 98.1 F (36.7 C)     Temp Source 07/01/22 1146 Oral     SpO2 07/01/22 1146 97 %     Weight --      Height --      Head Circumference --      Peak Flow --      Pain Score 07/01/22 1149 0     Pain Loc --      Pain Edu? --      Excl. in [place]? --     Most recent vital signs:  Vitals:   07/01/22 1146  BP: (!) 95/54  Pulse: 97  Resp: 16  Temp: 98.1 F (36.7 C)  SpO2: 97%     General: Awake, no distress. . CV:  Good peripheral perfusion.  No peripheral edema Resp:  Normal effort.  Lungs are clear Abd:  No distention.  Abdomen is soft nontender Neuro:             Awake, Alert, Oriented x 3  Other:     ED Results / Procedures / Treatments  Labs (all labs ordered are listed, but only abnormal results are displayed) Labs Reviewed  CBC WITH DIFFERENTIAL/PLATELET - Abnormal; Notable for the following components:      Result Value   WBC 33.7 (*)    RBC 3.69 (*)    RDW 21.5 (*)    Platelets 79 (*)    Neutro Abs 29.3 (*)    Basophils Absolute 0.2 (*)    Abs Immature Granulocytes 2.68 (*)    All other components within normal limits  CULTURE, BLOOD (ROUTINE X 2)  CULTURE, BLOOD (ROUTINE X 2)  BASIC METABOLIC  PANEL  MAGNESIUM  PROCALCITONIN  URINALYSIS, ROUTINE W REFLEX MICROSCOPIC  LACTIC ACID, PLASMA  LACTIC ACID, PLASMA  TROPONIN I (HIGH SENSITIVITY)  TROPONIN I (HIGH SENSITIVITY)     EKG  EKG interpretation performed by myself: NSR, nml axis, nml intervals, no acute ischemic changes    RADIOLOGY    PROCEDURES:  Critical Care performed: No  Procedures  MEDICATIONS ORDERED IN ED: Medications  lactated ringers bolus 1,000 mL (has no administration in time range)     IMPRESSION / MDM / ASSESSMENT AND PLAN / ED COURSE  I reviewed the triage vital signs and the nursing notes.                              Patient's presentation is most consistent with acute presentation with potential threat to life or bodily function.  Differential diagnosis includes, but is not limited to,   61 year old female presents after an episode of A-fib with RVR.  Started after she began feeling nauseous and was having dry heaving.  She took metoprolol flecainide at home and converted normal sinus rhythm on route.  ED blood pressures 90s over 50s which she tells me is her baseline.  EKG showing sinus rhythm.  Patient is essentially without complaints at this time lungs are clear abdomen is benign she denies any fevers or other infectious symptoms at home.  Labs are notable for leukocytosis of 33.  Patient did get a dose of filgrastim yesterday however I spoke with oncology and Dr. B notes that should not see this rise in white count after 1 day and that typically peaks at 7 to 10 days.  Given the significant leukocytosis we will send urine procalcitonin get chest x-ray.  We will send blood cultures.  If patient still feeling well and infectious work-up is reassuring may consider discharge with close outpatient follow-up.  Patient's chest x-ray does not show any obvious focal infiltrate.  Urinalysis is still pending.  Procalcitonin is just mildly elevated at 0.14.  I discussed plan with patient to check  blood cultures and urine and urine culture and if feeling okay that she can potentially be discharged with close outpatient follow-up.  Signed out to oncoming provider.       FINAL CLINICAL IMPRESSION(S) / ED DIAGNOSES   Final diagnoses:  Palpitations  Leukocytosis, unspecified  type     Rx / DC Orders   ED Discharge Orders     None        Note:  This document was prepared using Dragon voice recognition software and may include unintentional dictation errors.   Rada Hay, MD 07/01/22 [phone]

## 2022-07-01 NOTE — ED Provider Notes (Addendum)
Care assumed from Dr. Starleen Blue, presented for palpitations and is now back in normal sinus rhythm after taking her home medications of flecainide for her atrial fibrillation.  Leukocytosis.  No others symptoms for an infectious process.  Blood cultures obtained and currently waiting for UA.  Plan to be discharged home with close follow-up.  UA concerning for urinary tract infection.  Urine culture added on.  Reviewed recent urine culture data and patient had symptoms of a urinary tract infection 3 days ago and a urine culture that resulted with staph's saprophyticus.  Only has 30,000 CFU's.  Consulted the pharmacist and they also discussed with the infectious disease pharmacist who recommended giving doxycycline for 5 days.  Recommended admission to the patient given her leukocytosis symptoms of dysuria however patient was adamant that she goes home.  States that she did not want to stay in the emergency department.  Patient received 1 dose of IV Rocephin in the emergency department and then given her first dose of p.o. doxycycline.  Given return precautions for any worsening symptoms.   Nathaniel Man, MD 07/01/22 5638    Nathaniel Man, MD 07/01/22 1727

## 2022-07-01 NOTE — ED Triage Notes (Signed)
Pt. To ED via EMS for rapid heart rate up to 200. Pt. States she had chemo today, was feeling ill and vomiting after which she says is normal for her, but felt herself go into A-fib. Pt. Has hx of a-fib. Pt. Took metoprolol and flecainide at home, and Received fluids en route via EMS and converted back to SR prior to hospital arrival. Pt. Currently has no complaints.

## 2022-07-03 LAB — URINE CULTURE

## 2022-07-06 LAB — CULTURE, BLOOD (ROUTINE X 2)
Culture: NO GROWTH
Culture: NO GROWTH

## 2022-07-11 ENCOUNTER — Encounter (INDEPENDENT_AMBULATORY_CARE_PROVIDER_SITE_OTHER): Payer: Self-pay

## 2022-07-11 MED FILL — Dexamethasone Sodium Phosphate Inj 100 MG/10ML: INTRAMUSCULAR | Qty: 1 | Status: AC

## 2022-07-12 ENCOUNTER — Encounter: Payer: Self-pay | Admitting: Oncology

## 2022-07-12 ENCOUNTER — Inpatient Hospital Stay (HOSPITAL_BASED_OUTPATIENT_CLINIC_OR_DEPARTMENT_OTHER): Payer: BC Managed Care – PPO | Admitting: Oncology

## 2022-07-12 ENCOUNTER — Inpatient Hospital Stay: Payer: BC Managed Care – PPO

## 2022-07-12 VITALS — BP 97/67 | HR 84 | Temp 98.5°F | Resp 18 | Wt 157.5 lb

## 2022-07-12 DIAGNOSIS — C7A8 Other malignant neuroendocrine tumors: Secondary | ICD-10-CM

## 2022-07-12 DIAGNOSIS — D508 Other iron deficiency anemias: Secondary | ICD-10-CM

## 2022-07-12 DIAGNOSIS — Z5111 Encounter for antineoplastic chemotherapy: Secondary | ICD-10-CM | POA: Diagnosis not present

## 2022-07-12 LAB — COMPREHENSIVE METABOLIC PANEL WITH GFR
ALT: 26 U/L (ref 0–44)
AST: 38 U/L (ref 15–41)
Albumin: 3.9 g/dL (ref 3.5–5.0)
Alkaline Phosphatase: 91 U/L (ref 38–126)
Anion gap: 5 (ref 5–15)
BUN: 12 mg/dL (ref 8–23)
CO2: 26 mmol/L (ref 22–32)
Calcium: 9 mg/dL (ref 8.9–10.3)
Chloride: 107 mmol/L (ref 98–111)
Creatinine, Ser: 0.69 mg/dL (ref 0.44–1.00)
GFR, Estimated: >60 mL/min
Glucose, Bld: 110 mg/dL — ABNORMAL HIGH (ref 70–99)
Potassium: 3.7 mmol/L (ref 3.5–5.1)
Sodium: 138 mmol/L (ref 135–145)
Total Bilirubin: 0.7 mg/dL (ref 0.3–1.2)
Total Protein: 7 g/dL (ref 6.5–8.1)

## 2022-07-12 LAB — IRON AND TIBC
Iron: 120 ug/dL (ref 28–170)
Saturation Ratios: 28 % (ref 10.4–31.8)
TIBC: 434 ug/dL (ref 250–450)
UIBC: 314 ug/dL

## 2022-07-12 LAB — CBC WITH DIFFERENTIAL/PLATELET
Abs Immature Granulocytes: 0.04 K/uL (ref 0.00–0.07)
Basophils Absolute: 0.1 K/uL (ref 0.0–0.1)
Basophils Relative: 1 %
Eosinophils Absolute: 0.3 K/uL (ref 0.0–0.5)
Eosinophils Relative: 6 %
HCT: 36.6 % (ref 36.0–46.0)
Hemoglobin: 12.1 g/dL (ref 12.0–15.0)
Immature Granulocytes: 1 %
Lymphocytes Relative: 32 %
Lymphs Abs: 1.5 K/uL (ref 0.7–4.0)
MCH: 33.8 pg (ref 26.0–34.0)
MCHC: 33.1 g/dL (ref 30.0–36.0)
MCV: 102.2 fL — ABNORMAL HIGH (ref 80.0–100.0)
Monocytes Absolute: 0.7 K/uL (ref 0.1–1.0)
Monocytes Relative: 14 %
Neutro Abs: 2.2 K/uL (ref 1.7–7.7)
Neutrophils Relative %: 46 %
Platelets: 91 K/uL — ABNORMAL LOW (ref 150–400)
RBC: 3.58 MIL/uL — ABNORMAL LOW (ref 3.87–5.11)
RDW: 20.9 % — ABNORMAL HIGH (ref 11.5–15.5)
WBC: 4.8 K/uL (ref 4.0–10.5)
nRBC: 0 % (ref 0.0–0.2)

## 2022-07-12 LAB — FERRITIN: Ferritin: 389 ng/mL — ABNORMAL HIGH (ref 11–307)

## 2022-07-12 MED ORDER — SODIUM CHLORIDE 0.9% FLUSH
10.0000 mL | Freq: Once | INTRAVENOUS | Status: AC | PRN
  Administered 2022-07-12: 10 mL
  Filled 2022-07-12: qty 10

## 2022-07-12 MED ORDER — OXALIPLATIN CHEMO INJECTION 100 MG/20ML
65.0000 mg/m2 | Freq: Once | INTRAVENOUS | Status: AC
  Administered 2022-07-12: 110 mg via INTRAVENOUS
  Filled 2022-07-12: qty 10

## 2022-07-12 MED ORDER — SODIUM CHLORIDE 0.9 % IV SOLN
2400.0000 mg/m2 | INTRAVENOUS | Status: DC
  Administered 2022-07-12: 4000 mg via INTRAVENOUS
  Filled 2022-07-12: qty 80

## 2022-07-12 MED ORDER — FLUOROURACIL CHEMO INJECTION 2.5 GM/50ML
400.0000 mg/m2 | Freq: Once | INTRAVENOUS | Status: AC
  Administered 2022-07-12: 650 mg via INTRAVENOUS
  Filled 2022-07-12: qty 13

## 2022-07-12 MED ORDER — DEXTROSE 5 % IV SOLN
Freq: Once | INTRAVENOUS | Status: AC
  Filled 2022-07-12: qty 250

## 2022-07-12 MED ORDER — SODIUM CHLORIDE 0.9 % IV SOLN
10.0000 mg | Freq: Once | INTRAVENOUS | Status: AC
  Administered 2022-07-12: 10 mg via INTRAVENOUS
  Filled 2022-07-12: qty 10

## 2022-07-12 MED ORDER — PALONOSETRON HCL INJECTION 0.25 MG/5ML
0.2500 mg | Freq: Once | INTRAVENOUS | Status: AC
  Administered 2022-07-12: 0.25 mg via INTRAVENOUS
  Filled 2022-07-12: qty 5

## 2022-07-12 MED ORDER — LEUCOVORIN CALCIUM INJECTION 350 MG
650.0000 mg | Freq: Once | INTRAVENOUS | Status: AC
  Administered 2022-07-12: 650 mg via INTRAVENOUS
  Filled 2022-07-12: qty 25

## 2022-07-12 NOTE — Patient Instructions (Signed)
[facility]  Discharge Instructions: Thank you for choosing [facility] to provide your oncology and hematology care.  If you have a lab appointment with the Cancer Center, please go directly to the Cancer Center and check in at the registration area.  Wear comfortable clothing and clothing appropriate for easy access to any Portacath or PICC line.   We strive to give you quality time with your provider. You may need to reschedule your appointment if you arrive late (15 or more minutes).  Arriving late affects you and other patients whose appointments are after yours.  Also, if you miss three or more appointments without notifying the office, you may be dismissed from the clinic at the provider's discretion.      For prescription refill requests, have your pharmacy contact our office and allow 72 hours for refills to be completed.    Today you received the following chemotherapy and/or immunotherapy agents Eloxatin, Leucovorin, & Adrucil      To help prevent nausea and vomiting after your treatment, we encourage you to take your nausea medication as directed.  BELOW ARE SYMPTOMS THAT SHOULD BE REPORTED IMMEDIATELY: *FEVER GREATER THAN 100.4 F (38 C) OR HIGHER *CHILLS OR SWEATING *NAUSEA AND VOMITING THAT IS NOT CONTROLLED WITH YOUR NAUSEA MEDICATION *UNUSUAL SHORTNESS OF BREATH *UNUSUAL BRUISING OR BLEEDING *URINARY PROBLEMS (pain or burning when urinating, or frequent urination) *BOWEL PROBLEMS (unusual diarrhea, constipation, pain near the anus) TENDERNESS IN MOUTH AND THROAT WITH OR WITHOUT PRESENCE OF ULCERS (sore throat, sores in mouth, or a toothache) UNUSUAL RASH, SWELLING OR PAIN  UNUSUAL VAGINAL DISCHARGE OR ITCHING   Items with * indicate a potential emergency and should be followed up as soon as possible or go to the Emergency Department if any problems should occur.  Please show the CHEMOTHERAPY ALERT CARD or IMMUNOTHERAPY ALERT  CARD at check-in to the Emergency Department and triage nurse.  Should you have questions after your visit or need to cancel or reschedule your appointment, please contact [facility]  [phone] and follow the prompts.  Office hours are 8:00 a.m. to 4:30 p.m. Monday - Friday. Please note that voicemails left after 4:00 p.m. may not be returned until the following business day.  We are closed weekends and major holidays. You have access to a nurse at all times for urgent questions. Please call the main number to the clinic [phone] and follow the prompts.  For any non-urgent questions, you may also contact your provider using MyChart. We now offer e-Visits for anyone 18 and older to request care online for non-urgent symptoms. For details visit [email or web address].   Also download the MyChart app! Go to the app store, search "MyChart", open the app, select [facility], and log in with your MyChart username and password.  Masks are optional in the cancer centers. If you would like for your care team to wear a mask while they are taking care of you, please let them know. For doctor visits, patients may have with them one support person who is at least 61 years old. At this time, visitors are not allowed in the infusion area.   

## 2022-07-12 NOTE — Progress Notes (Signed)
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 Haig Prophet on 03/01/2022 EGD showed a medium-sized ulcerating mass with no bleeding or stigmata of recent bleeding at the GE junction..  Mass was partially obstructing.  There is also mention of another ulcerated noncircumferential mass with oozing bleeding found at the GE junction with extension into the cardia.  Both these masses were biopsied.  Pathology was pending at the time of my visit with the patient and was reported to be a poorly differentiated carcinoma.  Patient is currently able to swallow soft foods without much difficulty.  However more solid foods occasionally feel stuck.    PET was denied by insurance.  CT chest abdomen and pelvis with contrast showed asymmetric distal esophageal wall thickening with thickening extending around the lesser curvature of the stomach through the gastric cardia and proximal fundus.  Paratracheal/paraesophageal, gastrohepatic and PET retroperitoneal adenopathy.  Left periaortic lymph node measuring 17 mm at the level of left renal hilum.   Final pathology showed there were 2 specimens collected.  Specimen a was poorly differentiated carcinoma with ulceration and necrosis.v verys weakly positive for synaptophysin 50% of the cells.  Cells negative for CK7 and CK23 for the BF1 CDX2 100 Melan-A HMB45  and CD20.  Specimen B showed large cell neuroendocrine carcinoma with neoplastic cells positive for CD56 with diffuse strong staining and majority of the neoplastic cells were also positive for synaptophysin with weak staining Ki-67 95%   Patient went to [facility] for second opinion.  Pathology was also looked at but was signed out as poorly differentiated adenocarcinoma at [facility].  Plan was to see if patient responds to cis etoposide Tecentriq regimen and if there is no adequate response switch her to FOLFOX      Interval history-tolerating chemotherapy well so far.  Denies any significant peripheral neuropathy.  She does have a day or 2 of fatigue after getting her pump out.  Denies any significant nausea or vomiting.  She was in the ER last week for an episode of atrial fibrillation which lasted for a couple of hours likely triggered due to UTI.  It has now resolved.  ECOG PS- 1 Pain scale- 0   Review of systems- Review of Systems  Constitutional:  Positive for malaise/fatigue. Negative for chills, fever and weight loss.  HENT:  Negative for congestion, ear discharge and nosebleeds.   Eyes:  Negative for blurred vision.  Respiratory:  Negative for cough, hemoptysis, sputum production, shortness of breath and wheezing.   Cardiovascular:  Negative for chest pain, palpitations, orthopnea and claudication.  Gastrointestinal:  Negative for abdominal pain, blood in stool, constipation, diarrhea, heartburn, melena, nausea and vomiting.  Genitourinary:  Negative for dysuria, flank pain, frequency, hematuria and urgency.  Musculoskeletal:  Negative for back pain, joint pain and myalgias.  Skin:  Negative for rash.  Neurological:  Negative for dizziness, tingling, focal weakness, seizures, weakness and headaches.  Endo/Heme/Allergies:  Does not  bruise/bleed easily.  Psychiatric/Behavioral:  Negative for depression and suicidal ideas. The patient does not have insomnia.       Allergies  Allergen  Reactions   Sulfa Antibiotics Hives    Says her tongue swelled a bit   Sulfonamide Derivatives Swelling    Tongue swelling     Past Medical History:  Diagnosis Date   Anemia    Arthritis    Atrial fibrillation ([place])    B12 deficiency    Dysrhythmia    Hyperlipidemia    Hypothyroidism    Menopause    Palpitations    Hx of, 25 years ago   Vertigo      Past Surgical History:  Procedure Laterality Date   BILATERAL SALPINGOOPHORECTOMY  2008   DILATION AND CURETTAGE OF UTERUS  08/24/2006   ESOPHAGOGASTRODUODENOSCOPY (EGD) WITH PROPOFOL N/A 03/01/2022   Procedure: ESOPHAGOGASTRODUODENOSCOPY (EGD) WITH PROPOFOL;  Surgeon: Lesly Rubenstein, MD;  Location: [facility] ENDOSCOPY;  Service: Endoscopy;  Laterality: N/A;   ESOPHAGOGASTRODUODENOSCOPY (EGD) WITH PROPOFOL N/A 05/04/2022   Procedure: ESOPHAGOGASTRODUODENOSCOPY (EGD) WITH PROPOFOL;  Surgeon: [name], MD;  Location: [facility] ENDOSCOPY;  Service: Gastroenterology;  Laterality: N/A;   incision tendon sheath for trigger finger Right    ring finger   LAPAROSCOPIC SUPRACERVICAL HYSTERECTOMY  2008   Dr. Ammie Dalton; with BSO   NOSE SURGERY  03/2008   PORTA CATH INSERTION N/A 03/21/2022   Procedure: PORTA CATH INSERTION;  Surgeon: Algernon Huxley, MD;  Location: [place] [facility];  Service: Cardiovascular;  Laterality: N/A;    Social History   Socioeconomic History   Marital status: Married    Spouse name: Not on file   Number of children: Not on file   Years of education: Not on file   Highest education level: Not on file  Occupational History   Occupation: Full time  Tobacco Use   Smoking status: Never   Smokeless tobacco: Never  Vaping Use   Vaping Use: Never used  Substance and Sexual Activity   Alcohol use: Never   Drug use: Never   Sexual activity: Not Currently    Birth control/protection: Surgical    Comment: Hysterectomy  Other Topics Concern   Not on file  Social History Narrative   Married with 2 children    Gets regular exercise   Social Determinants of Health   Financial Resource Strain: Low Risk  (04/20/2022)   Overall Financial Resource Strain (CARDIA)    Difficulty of Paying Living Expenses: Not very hard  Food Insecurity: No Food Insecurity (04/20/2022)   Hunger Vital Sign    Worried About Running Out of Food in the Last Year: Never true    Ran Out of Food in the Last Year: Never true  Transportation Needs: No Transportation Needs (04/20/2022)   PRAPARE - Hydrologist (Medical): No    Lack of Transportation (Non-Medical): No  Physical Activity: Inactive (04/20/2022)   Exercise Vital Sign    Days of Exercise per Week: 0 days    Minutes of Exercise per Session: 0 min  Stress: No Stress Concern Present (04/20/2022)   [place]    Feeling of Stress : Only a little  Social Connections: Socially Integrated (04/20/2022)   Social Connection and Isolation Panel [NHANES]    Frequency of Communication with Friends and Family: More than three times a week    Frequency of Social Gatherings with Friends and Family: More than three  times a week    Attends Religious Services: More than 4 times per year    Active Member of Clubs or Organizations: Yes    Attends Music therapist: More than 4 times per year    Marital Status: Married  Human resources officer Violence: Not on file    Family History  Problem Relation Age of Onset   Hyperlipidemia Mother    Hypertension Mother    Stroke Mother    Heart disease Father    Hyperlipidemia Father    Atrial fibrillation Father    Melanoma Father    Arrhythmia Sister        Atrial fibrillation   Heart disease Sister    Atrial fibrillation Sister    Heart attack Brother    Breast cancer Neg Hx      Current Outpatient Medications:    Calcium Carbonate-Vitamin D 600-200 MG-UNIT TABS, Take 1 tablet by mouth daily.  , Disp: , Rfl:    cetirizine (ZYRTEC) 10  MG tablet, Take 10 mg by mouth daily., Disp: , Rfl:    Cholecalciferol 25 MCG (1000 UT) tablet, Take 1,000 Units by mouth daily., Disp: , Rfl:    flecainide (TAMBOCOR) 50 MG tablet, Take 1 tablet by mouth 2 (two) times daily., Disp: , Rfl:    LORazepam (ATIVAN) 0.5 MG tablet, Take 1 tablet (0.5 mg total) by mouth every 6 (six) hours as needed (Nausea or vomiting)., Disp: 30 tablet, Rfl: 0   midodrine (PROAMATINE) 10 MG tablet, Take by mouth., Disp: , Rfl:    mometasone (NASONEX) 50 MCG/ACT nasal spray, Place 2 sprays into the nose daily., Disp: , Rfl:    nystatin cream (MYCOSTATIN), Apply topically 2 (two) times daily., Disp: , Rfl:    OLANZapine (ZYPREXA) 10 MG tablet, TAKE 1 TABLET BY MOUTH EVERYDAY AT BEDTIME, Disp: 90 tablet, Rfl: 1   ondansetron (ZOFRAN) 8 MG tablet, TAKE 1 TABLET BY MOUTH 2 TIMES DAILY AS NEEDED. START ON THE THIRD DAY AFTER CISPLATIN CHEMOTHERAPY., Disp: 18 tablet, Rfl: 3   ondansetron (ZOFRAN-ODT) 4 MG disintegrating tablet, Take 1 tablet (4 mg total) by mouth every 8 (eight) hours as needed for nausea or vomiting., Disp: 20 tablet, Rfl: 3   pantoprazole (PROTONIX) 40 MG tablet, Take 40 mg by mouth daily., Disp: , Rfl:    Saccharomyces boulardii (PROBIOTIC) 250 MG CAPS, Take 1 capsule by mouth daily., Disp: , Rfl:    simvastatin (ZOCOR) 20 MG tablet, TAKE 1 TABLET BY MOUTH EVERY EVENING., Disp: 30 tablet, Rfl: 0   traZODone (DESYREL) 50 MG tablet, Take 50 mg by mouth at bedtime as needed., Disp: , Rfl:    Zinc Citrate-Phytase (ZYTAZE) 25-500 MG CAPS, Take 1 Dose by mouth daily., Disp: , Rfl:    doxycycline (ADOXA) 100 MG tablet, Take 100 mg by mouth 2 (two) times daily. (Patient not taking: Reported on 07/12/2022), Disp: , Rfl:  No current facility-administered medications for this visit.  Facility-Administered Medications Ordered in Other Visits:    fluorouracil (ADRUCIL) 4,000 mg in sodium chloride 0.9 % 70 mL chemo infusion, 2,400 mg/m2 (Treatment Plan Recorded),  Intravenous, 1 day or 1 dose, Sindy Guadeloupe, MD   fluorouracil (ADRUCIL) chemo injection 650 mg, 400 mg/m2 (Treatment Plan Recorded), Intravenous, Once, Sindy Guadeloupe, MD   heparin lock flush 100 UNIT/ML injection, , , ,   Physical exam:  Vitals:   07/12/22 0854  BP: 97/67  Pulse: 84  Resp: 18  Temp: 98.5 F (36.9 C)  SpO2: 97%  Weight: 157 lb 8 oz (71.4 kg)   Physical Exam Constitutional:      General: She is not in acute distress. Cardiovascular:     Rate and Rhythm: Normal rate and regular rhythm.     Heart sounds: Normal heart sounds.  Pulmonary:     Effort: Pulmonary effort is normal.     Breath sounds: Normal breath sounds.  Skin:    General: Skin is warm and dry.  Neurological:     Mental Status: She is alert and oriented to person, place, and time.         Latest Ref Rng & Units 07/12/2022    8:06 AM  CMP  Glucose 70 - 99 mg/dL 110   BUN 8 - 23 mg/dL 12   Creatinine 0.44 - 1.00 mg/dL 0.69   Sodium 135 - 145 mmol/L 138   Potassium 3.5 - 5.1 mmol/L 3.7   Chloride 98 - 111 mmol/L 107   CO2 22 - 32 mmol/L 26   Calcium 8.9 - 10.3 mg/dL 9.0   Total Protein 6.5 - 8.1 g/dL 7.0   Total Bilirubin 0.3 - 1.2 mg/dL 0.7   Alkaline Phos 38 - 126 U/L 91   AST 15 - 41 U/L 38   ALT 0 - 44 U/L 26       Latest Ref Rng & Units 07/12/2022    8:06 AM  CBC  WBC 4.0 - 10.5 K/uL 4.8   Hemoglobin 12.0 - 15.0 g/dL 12.1   Hematocrit 36.0 - 46.0 % 36.6   Platelets 150 - 400 K/uL 91     No images are attached to the encounter.  DG Chest Portable 1 View  Result Date: 07/01/2022 CLINICAL DATA:  Shortness of breath. EXAM: PORTABLE CHEST 1 VIEW COMPARISON:  May 03, 2022. FINDINGS: Stable cardiomediastinal silhouette. Hypoinflation of the lungs is noted with mild bibasilar subsegmental atelectasis. Right internal jugular Port-A-Cath is unchanged. Bony thorax is unremarkable. IMPRESSION: Hypoinflation of the lungs with mild bibasilar subsegmental atelectasis. Electronically  Signed   By: Marijo Conception M.D.   On: 07/01/2022 13:42   MM 3D SCREEN BREAST BILATERAL  Result Date: 06/22/2022 CLINICAL DATA:  Screening. EXAM: DIGITAL SCREENING BILATERAL MAMMOGRAM WITH TOMOSYNTHESIS AND CAD TECHNIQUE: Bilateral screening digital craniocaudal and mediolateral oblique mammograms were obtained. Bilateral screening digital breast tomosynthesis was performed. The images were evaluated with computer-aided detection. COMPARISON:  Previous exam(s). ACR Breast Density Category b: There are scattered areas of fibroglandular density. FINDINGS: There are no findings suspicious for malignancy. IMPRESSION: No mammographic evidence of malignancy. A result letter of this screening mammogram will be mailed directly to the patient. RECOMMENDATION: Screening mammogram in one year. (Code:SM-B-01Y) BI-RADS CATEGORY  1: Negative. Electronically Signed   By: Fidela Salisbury M.D.   On: 06/22/2022 17:12     Assessment and plan- Patient is a 61 y.o. 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  She is here for on treatment assessment prior to cycle 5 of palliative FOLFOX chemotherapy  Counts okay to proceed with cycle 5 of palliative FOLFOX chemotherapy today with pump disconnect on day 3.  She did receive the Udenyca on day 3.  I will see her back in 2 weeks for cycle 6.  Scans to be repeated after 6 cycles   Visit Diagnosis 1. Encounter for antineoplastic chemotherapy   2. Primary malignant neuroendocrine tumor of esophagus (HCC)      Dr. Randa Evens, MD, MPH [place] at  [facility] [phone] 07/12/2022 12:38 PM

## 2022-07-12 NOTE — Patient Instructions (Signed)

## 2022-07-13 LAB — CEA: CEA: 2.5 ng/mL (ref 0.0–4.7)

## 2022-07-14 ENCOUNTER — Inpatient Hospital Stay: Payer: BC Managed Care – PPO | Attending: Oncology

## 2022-07-14 VITALS — BP 109/58 | HR 75 | Temp 97.7°F | Resp 16

## 2022-07-14 DIAGNOSIS — C160 Malignant neoplasm of cardia: Secondary | ICD-10-CM | POA: Diagnosis not present

## 2022-07-14 DIAGNOSIS — C7A8 Other malignant neuroendocrine tumors: Secondary | ICD-10-CM

## 2022-07-14 DIAGNOSIS — D701 Agranulocytosis secondary to cancer chemotherapy: Secondary | ICD-10-CM | POA: Insufficient documentation

## 2022-07-14 DIAGNOSIS — G620 Drug-induced polyneuropathy: Secondary | ICD-10-CM | POA: Insufficient documentation

## 2022-07-14 DIAGNOSIS — T451X5A Adverse effect of antineoplastic and immunosuppressive drugs, initial encounter: Secondary | ICD-10-CM | POA: Diagnosis not present

## 2022-07-14 DIAGNOSIS — Z5111 Encounter for antineoplastic chemotherapy: Secondary | ICD-10-CM | POA: Insufficient documentation

## 2022-07-14 DIAGNOSIS — C155 Malignant neoplasm of lower third of esophagus: Secondary | ICD-10-CM | POA: Diagnosis present

## 2022-07-14 DIAGNOSIS — D6481 Anemia due to antineoplastic chemotherapy: Secondary | ICD-10-CM | POA: Diagnosis not present

## 2022-07-14 MED ORDER — SODIUM CHLORIDE 0.9% FLUSH
10.0000 mL | INTRAVENOUS | Status: DC | PRN
  Administered 2022-07-14: 10 mL
  Filled 2022-07-14: qty 10

## 2022-07-14 MED ORDER — PEGFILGRASTIM-CBQV 6 MG/0.6ML ~~LOC~~ SOSY
6.0000 mg | PREFILLED_SYRINGE | Freq: Once | SUBCUTANEOUS | Status: AC
  Administered 2022-07-14: 6 mg via SUBCUTANEOUS
  Filled 2022-07-14: qty 0.6

## 2022-07-14 MED ORDER — HEPARIN SOD (PORK) LOCK FLUSH 100 UNIT/ML IV SOLN
500.0000 [IU] | Freq: Once | INTRAVENOUS | Status: AC | PRN
  Administered 2022-07-14: 500 [IU]
  Filled 2022-07-14: qty 5

## 2022-07-25 MED FILL — Dexamethasone Sodium Phosphate Inj 100 MG/10ML: INTRAMUSCULAR | Qty: 1 | Status: AC

## 2022-07-26 ENCOUNTER — Inpatient Hospital Stay: Payer: BC Managed Care – PPO

## 2022-07-26 ENCOUNTER — Encounter: Payer: Self-pay | Admitting: Oncology

## 2022-07-26 ENCOUNTER — Inpatient Hospital Stay (HOSPITAL_BASED_OUTPATIENT_CLINIC_OR_DEPARTMENT_OTHER): Payer: BC Managed Care – PPO | Admitting: Oncology

## 2022-07-26 VITALS — BP 108/53 | HR 86 | Temp 97.4°F | Resp 18 | Wt 159.7 lb

## 2022-07-26 DIAGNOSIS — T451X5A Adverse effect of antineoplastic and immunosuppressive drugs, initial encounter: Secondary | ICD-10-CM | POA: Diagnosis not present

## 2022-07-26 DIAGNOSIS — Z5111 Encounter for antineoplastic chemotherapy: Secondary | ICD-10-CM

## 2022-07-26 DIAGNOSIS — D6959 Other secondary thrombocytopenia: Secondary | ICD-10-CM

## 2022-07-26 DIAGNOSIS — D701 Agranulocytosis secondary to cancer chemotherapy: Secondary | ICD-10-CM

## 2022-07-26 DIAGNOSIS — C7A8 Other malignant neuroendocrine tumors: Secondary | ICD-10-CM

## 2022-07-26 LAB — CBC WITH DIFFERENTIAL/PLATELET
Abs Immature Granulocytes: 0.07 10*3/uL (ref 0.00–0.07)
Basophils Absolute: 0.1 10*3/uL (ref 0.0–0.1)
Basophils Relative: 1 %
Eosinophils Absolute: 0.2 10*3/uL (ref 0.0–0.5)
Eosinophils Relative: 5 %
HCT: 35.1 % — ABNORMAL LOW (ref 36.0–46.0)
Hemoglobin: 11.7 g/dL — ABNORMAL LOW (ref 12.0–15.0)
Immature Granulocytes: 1 %
Lymphocytes Relative: 26 %
Lymphs Abs: 1.3 10*3/uL (ref 0.7–4.0)
MCH: 34.7 pg — ABNORMAL HIGH (ref 26.0–34.0)
MCHC: 33.3 g/dL (ref 30.0–36.0)
MCV: 104.2 fL — ABNORMAL HIGH (ref 80.0–100.0)
Monocytes Absolute: 0.6 10*3/uL (ref 0.1–1.0)
Monocytes Relative: 13 %
Neutro Abs: 2.6 10*3/uL (ref 1.7–7.7)
Neutrophils Relative %: 54 %
Platelets: 75 10*3/uL — ABNORMAL LOW (ref 150–400)
RBC: 3.37 MIL/uL — ABNORMAL LOW (ref 3.87–5.11)
RDW: 19.8 % — ABNORMAL HIGH (ref 11.5–15.5)
WBC: 4.9 10*3/uL (ref 4.0–10.5)
nRBC: 0 % (ref 0.0–0.2)

## 2022-07-26 LAB — COMPREHENSIVE METABOLIC PANEL
ALT: 23 U/L (ref 0–44)
AST: 34 U/L (ref 15–41)
Albumin: 3.7 g/dL (ref 3.5–5.0)
Alkaline Phosphatase: 98 U/L (ref 38–126)
Anion gap: 8 (ref 5–15)
BUN: 12 mg/dL (ref 8–23)
CO2: 25 mmol/L (ref 22–32)
Calcium: 8.9 mg/dL (ref 8.9–10.3)
Chloride: 104 mmol/L (ref 98–111)
Creatinine, Ser: 0.73 mg/dL (ref 0.44–1.00)
GFR, Estimated: >60 mL/min (ref >60)
Glucose, Bld: 129 mg/dL — ABNORMAL HIGH (ref 70–99)
Potassium: 3.5 mmol/L (ref 3.5–5.1)
Sodium: 137 mmol/L (ref 135–145)
Total Bilirubin: 0.7 mg/dL (ref 0.3–1.2)
Total Protein: 6.9 g/dL (ref 6.5–8.1)

## 2022-07-26 MED ORDER — DEXTROSE 5 % IV SOLN
Freq: Once | INTRAVENOUS | Status: AC
  Filled 2022-07-26: qty 250

## 2022-07-26 MED ORDER — FLUOROURACIL CHEMO INJECTION 2.5 GM/50ML
400.0000 mg/m2 | Freq: Once | INTRAVENOUS | Status: AC
  Administered 2022-07-26: 650 mg via INTRAVENOUS
  Filled 2022-07-26: qty 13

## 2022-07-26 MED ORDER — LEUCOVORIN CALCIUM INJECTION 350 MG
650.0000 mg | Freq: Once | INTRAVENOUS | Status: AC
  Administered 2022-07-26: 650 mg via INTRAVENOUS
  Filled 2022-07-26: qty 25

## 2022-07-26 MED ORDER — PALONOSETRON HCL INJECTION 0.25 MG/5ML
0.2500 mg | Freq: Once | INTRAVENOUS | Status: AC
  Administered 2022-07-26: 0.25 mg via INTRAVENOUS
  Filled 2022-07-26: qty 5

## 2022-07-26 MED ORDER — SODIUM CHLORIDE 0.9 % IV SOLN
2400.0000 mg/m2 | INTRAVENOUS | Status: DC
  Administered 2022-07-26: 4000 mg via INTRAVENOUS
  Filled 2022-07-26: qty 80

## 2022-07-26 MED ORDER — SODIUM CHLORIDE 0.9 % IV SOLN
10.0000 mg | Freq: Once | INTRAVENOUS | Status: AC
  Administered 2022-07-26: 10 mg via INTRAVENOUS
  Filled 2022-07-26: qty 10

## 2022-07-26 NOTE — Patient Instructions (Signed)
[facility] AT [place]  Discharge Instructions: Thank you for choosing [facility] to provide your oncology and hematology care.  If you have a lab appointment with the [facility], please go directly to the [place] and check in at the registration area.  Wear comfortable clothing and clothing appropriate for easy access to any Portacath or PICC line.   We strive to give you quality time with your provider. You may need to reschedule your appointment if you arrive late (15 or more minutes).  Arriving late affects you and other patients whose appointments are after yours.  Also, if you miss three or more appointments without notifying the office, you may be dismissed from the clinic at the provider's discretion.      For prescription refill requests, have your pharmacy contact our office and allow 72 hours for refills to be completed.    Today you received the following chemotherapy and/or immunotherapy agents LEUCOVORIN and 5FU      To help prevent nausea and vomiting after your treatment, we encourage you to take your nausea medication as directed.  BELOW ARE SYMPTOMS THAT SHOULD BE REPORTED IMMEDIATELY: *FEVER GREATER THAN 100.4 F (38 C) OR HIGHER *CHILLS OR SWEATING *NAUSEA AND VOMITING THAT IS NOT CONTROLLED WITH YOUR NAUSEA MEDICATION *UNUSUAL SHORTNESS OF BREATH *UNUSUAL BRUISING OR BLEEDING *URINARY PROBLEMS (pain or burning when urinating, or frequent urination) *BOWEL PROBLEMS (unusual diarrhea, constipation, pain near the anus) TENDERNESS IN MOUTH AND THROAT WITH OR WITHOUT PRESENCE OF ULCERS (sore throat, sores in mouth, or a toothache) UNUSUAL RASH, SWELLING OR PAIN  UNUSUAL VAGINAL DISCHARGE OR ITCHING   Items with * indicate a potential emergency and should be followed up as soon as possible or go to the Emergency Department if any problems should occur.  Please show the CHEMOTHERAPY ALERT CARD or IMMUNOTHERAPY ALERT CARD at  check-in to the Emergency Department and triage nurse.  Should you have questions after your visit or need to cancel or reschedule your appointment, please contact [facility] [place] AT [place]  [phone] and follow the prompts.  Office hours are 8:00 a.m. to 4:30 p.m. Monday - Friday. Please note that voicemails left after 4:00 p.m. may not be returned until the following business day.  We are closed weekends and major holidays. You have access to a nurse at all times for urgent questions. Please call the main number to the clinic [phone] and follow the prompts.  For any non-urgent questions, you may also contact your provider using MyChart. We now offer e-Visits for anyone 52 and older to request care online for non-urgent symptoms. For details visit [email or web address].   Also download the MyChart app! Go to the app store, search "MyChart", open the app, select [place], and log in with your MyChart username and password.  Masks are optional in the cancer centers. If you would like for your care team to wear a mask while they are taking care of you, please let them know. For doctor visits, patients may have with them one support person who is at least 61 years old. At this time, visitors are not allowed in the infusion area.  Leucovorin Injection What is this medication? LEUCOVORIN (loo koe VOR in) prevents side effects from certain medications, such as methotrexate. It works by increasing folate levels. This helps protect healthy cells in your body. It may also be used to treat anemia caused by low levels of folate. It can also be used with  fluorouracil, a type of chemotherapy, to treat colorectal cancer. It works by increasing the effects of fluorouracil in the body. This medicine may be used for other purposes; ask your health care provider or pharmacist if you have questions. What should I tell my care team before I take this medication? They need to know if you  have any of these conditions: Anemia from low levels of vitamin B12 in the blood An unusual or allergic reaction to leucovorin, folic acid, other medications, foods, dyes, or preservatives Pregnant or trying to get pregnant Breastfeeding How should I use this medication? This medication is injected into a vein or a muscle. It is given by your care team in a hospital or clinic setting. Talk to your care team about the use of this medication in children. Special care may be needed. Overdosage: If you think you have taken too much of this medicine contact a poison control center or emergency room at once. NOTE: This medicine is only for you. Do not share this medicine with others. What if I miss a dose? Keep appointments for follow-up doses. It is important not to miss your dose. Call your care team if you are unable to keep an appointment. What may interact with this medication? Capecitabine Fluorouracil Phenobarbital Phenytoin Primidone Trimethoprim;sulfamethoxazole This list may not describe all possible interactions. Give your health care provider a list of all the medicines, herbs, non-prescription drugs, or dietary supplements you use. Also tell them if you smoke, drink alcohol, or use illegal drugs. Some items may interact with your medicine. What should I watch for while using this medication? Your condition will be monitored carefully while you are receiving this medication. This medication may increase the side effects of 5-fluorouracil. Tell your care team if you have diarrhea or mouth sores that do not get better or that get worse. What side effects may I notice from receiving this medication? Side effects that you should report to your care team as soon as possible: Allergic reactions--skin rash, itching, hives, swelling of the face, lips, tongue, or throat This list may not describe all possible side effects. Call your doctor for medical advice about side effects. You may report  side effects to FDA at 1-800-FDA-1088. Where should I keep my medication? This medication is given in a hospital or clinic. It will not be stored at home. NOTE: This sheet is a summary. It may not cover all possible information. If you have questions about this medicine, talk to your doctor, pharmacist, or health care provider.  2023 Elsevier/Gold Standard (2022-02-01 00:00:00)  Fluorouracil Injection What is this medication? FLUOROURACIL (flure oh YOOR a sil) treats some types of cancer. It works by slowing down the growth of cancer cells. This medicine may be used for other purposes; ask your health care provider or pharmacist if you have questions. COMMON BRAND NAME(S): Adrucil What should I tell my care team before I take this medication? They need to know if you have any of these conditions: Blood disorders Dihydropyrimidine dehydrogenase (DPD) deficiency Infection, such as chickenpox, cold sores, herpes Kidney disease Liver disease Poor nutrition Recent or ongoing radiation therapy An unusual or allergic reaction to fluorouracil, other medications, foods, dyes, or preservatives If you or your partner are pregnant or trying to get pregnant Breast-feeding How should I use this medication? This medication is injected into a vein. It is administered by your care team in a hospital or clinic setting. Talk to your care team about the use of this medication  in children. Special care may be needed. Overdosage: If you think you have taken too much of this medicine contact a poison control center or emergency room at once. NOTE: This medicine is only for you. Do not share this medicine with others. What if I miss a dose? Keep appointments for follow-up doses. It is important not to miss your dose. Call your care team if you are unable to keep an appointment. What may interact with this medication? Do not take this medication with any of the following: Live virus vaccines This medication  may also interact with the following: Medications that treat or prevent blood clots, such as warfarin, enoxaparin, dalteparin This list may not describe all possible interactions. Give your health care provider a list of all the medicines, herbs, non-prescription drugs, or dietary supplements you use. Also tell them if you smoke, drink alcohol, or use illegal drugs. Some items may interact with your medicine. What should I watch for while using this medication? Your condition will be monitored carefully while you are receiving this medication. This medication may make you feel generally unwell. This is not uncommon as chemotherapy can affect healthy cells as well as cancer cells. Report any side effects. Continue your course of treatment even though you feel ill unless your care team tells you to stop. In some cases, you may be given additional medications to help with side effects. Follow all directions for their use. This medication may increase your risk of getting an infection. Call your care team for advice if you get a fever, chills, sore throat, or other symptoms of a cold or flu. Do not treat yourself. Try to avoid being around people who are sick. This medication may increase your risk to bruise or bleed. Call your care team if you notice any unusual bleeding. Be careful brushing or flossing your teeth or using a toothpick because you may get an infection or bleed more easily. If you have any dental work done, tell your dentist you are receiving this medication. Avoid taking medications that contain aspirin, acetaminophen, ibuprofen, naproxen, or ketoprofen unless instructed by your care team. These medications may hide a fever. Do not treat diarrhea with over the counter products. Contact your care team if you have diarrhea that lasts more than 2 days or if it is severe and watery. This medication can make you more sensitive to the sun. Keep out of the sun. If you cannot avoid being in the sun,  wear protective clothing and sunscreen. Do not use sun lamps, tanning beds, or tanning booths. Talk to your care team if you or your partner wish to become pregnant or think you might be pregnant. This medication can cause serious birth defects if taken during pregnancy and for 3 months after the last dose. A reliable form of contraception is recommended while taking this medication and for 3 months after the last dose. Talk to your care team about effective forms of contraception. Do not father a child while taking this medication and for 3 months after the last dose. Use a condom while having sex during this time period. Do not breastfeed while taking this medication. This medication may cause infertility. Talk to your care team if you are concerned about your fertility. What side effects may I notice from receiving this medication? Side effects that you should report to your care team as soon as possible: Allergic reactions--skin rash, itching, hives, swelling of the face, lips, tongue, or throat Heart attack--pain or tightness in  the chest, shoulders, arms, or jaw, nausea, shortness of breath, cold or clammy skin, feeling faint or lightheaded Heart failure--shortness of breath, swelling of the ankles, feet, or hands, sudden weight gain, unusual weakness or fatigue Heart rhythm changes--fast or irregular heartbeat, dizziness, feeling faint or lightheaded, chest pain, trouble breathing High ammonia level--unusual weakness or fatigue, confusion, loss of appetite, nausea, vomiting, seizures Infection--fever, chills, cough, sore throat, wounds that don't heal, pain or trouble when passing urine, general feeling of discomfort or being unwell Low red blood cell level--unusual weakness or fatigue, dizziness, headache, trouble breathing Pain, tingling, or numbness in the hands or feet, muscle weakness, change in vision, confusion or trouble speaking, loss of balance or coordination, trouble walking,  seizures Redness, swelling, and blistering of the skin over hands and feet Severe or prolonged diarrhea Unusual bruising or bleeding Side effects that usually do not require medical attention (report to your care team if they continue or are bothersome): Dry skin Headache Increased tears Nausea Pain, redness, or swelling with sores inside the mouth or throat Sensitivity to light Vomiting This list may not describe all possible side effects. Call your doctor for medical advice about side effects. You may report side effects to FDA at 1-800-FDA-1088. Where should I keep my medication? This medication is given in a hospital or clinic. It will not be stored at home. NOTE: This sheet is a summary. It may not cover all possible information. If you have questions about this medicine, talk to your doctor, pharmacist, or health care provider.  2023 Elsevier/Gold Standard (2021-12-28 00:00:00)

## 2022-07-26 NOTE — Progress Notes (Signed)
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 Haig Prophet on 03/01/2022 EGD showed a medium-sized ulcerating mass with no bleeding or stigmata of recent bleeding at the GE junction..  Mass was partially obstructing.  There is also mention of another ulcerated noncircumferential mass with oozing bleeding found at the GE junction with extension into the cardia.  Both these masses were biopsied.  Pathology was pending at the time of my visit with the patient and was reported to be a poorly differentiated carcinoma.  Patient is currently able to swallow soft foods without much difficulty.  However more solid foods occasionally feel stuck.    PET was denied by insurance.  CT chest abdomen and pelvis with contrast showed asymmetric distal esophageal wall thickening with thickening extending around the lesser curvature of the stomach through the gastric cardia and proximal fundus.  Paratracheal/paraesophageal, gastrohepatic and PET retroperitoneal adenopathy.  Left periaortic lymph node measuring 17 mm at the level of left renal hilum.   Final pathology showed there were 2 specimens collected.  Specimen a was poorly differentiated carcinoma with ulceration and necrosis.v verys weakly positive for synaptophysin 50% of the cells.  Cells negative for CK7 and CK23 for the BF1 CDX2 100 Melan-A  HMB45 and CD20.  Specimen B showed large cell neuroendocrine carcinoma with neoplastic cells positive for CD56 with diffuse strong staining and majority of the neoplastic cells were also positive for synaptophysin with weak staining Ki-67 95%   Patient went to [facility] for second opinion.  Pathology was also looked at but was signed out as poorly differentiated adenocarcinoma at [facility].  Plan was to see if patient responds to cis etoposide Tecentriq regimen and if there is no adequate response switch her to FOLFOX    Interval history-she is doing well on present chemotherapy regimen.  Occasional tingling numbness in her hands and feet which resolves on its own.  Appetite and weight have remained stable.  ECOG PS- 1 Pain scale- 0   Review of systems- Review of Systems  Constitutional:  Positive for malaise/fatigue. Negative for chills, fever and weight loss.  HENT:  Negative for congestion, ear discharge and nosebleeds.   Eyes:  Negative for blurred vision.  Respiratory:  Negative for cough, hemoptysis, sputum production, shortness of breath and wheezing.   Cardiovascular:  Negative for chest pain, palpitations, orthopnea and claudication.  Gastrointestinal:  Negative for abdominal pain, blood in stool, constipation, diarrhea, heartburn, melena, nausea and vomiting.  Genitourinary:  Negative for dysuria, flank pain, frequency, hematuria and urgency.  Musculoskeletal:  Negative for back pain, joint pain and myalgias.  Skin:  Negative for rash.  Neurological:  Negative for dizziness, tingling, focal weakness, seizures, weakness and headaches.  Endo/Heme/Allergies:  Does not bruise/bleed easily.  Psychiatric/Behavioral:  Negative for depression and suicidal ideas. The patient does not have insomnia.       Allergies  Allergen Reactions   Sulfa Antibiotics Hives  Says her tongue swelled a bit   Sulfonamide Derivatives Swelling    Tongue swelling     Past Medical History:  Diagnosis Date    Anemia    Arthritis    Atrial fibrillation ([place])    B12 deficiency    Dysrhythmia    Hyperlipidemia    Hypothyroidism    Menopause    Palpitations    Hx of, 25 years ago   Vertigo      Past Surgical History:  Procedure Laterality Date   BILATERAL SALPINGOOPHORECTOMY  2008   DILATION AND CURETTAGE OF UTERUS  08/24/2006   ESOPHAGOGASTRODUODENOSCOPY (EGD) WITH PROPOFOL N/A 03/01/2022   Procedure: ESOPHAGOGASTRODUODENOSCOPY (EGD) WITH PROPOFOL;  Surgeon: Lesly Rubenstein, MD;  Location: [facility] ENDOSCOPY;  Service: Endoscopy;  Laterality: N/A;   ESOPHAGOGASTRODUODENOSCOPY (EGD) WITH PROPOFOL N/A 05/04/2022   Procedure: ESOPHAGOGASTRODUODENOSCOPY (EGD) WITH PROPOFOL;  Surgeon: [name], MD;  Location: [facility] ENDOSCOPY;  Service: Gastroenterology;  Laterality: N/A;   incision tendon sheath for trigger finger Right    ring finger   LAPAROSCOPIC SUPRACERVICAL HYSTERECTOMY  2008   Dr. Ammie Dalton; with BSO   NOSE SURGERY  03/2008   PORTA CATH INSERTION N/A 03/21/2022   Procedure: PORTA CATH INSERTION;  Surgeon: Algernon Huxley, MD;  Location: [facility];  Service: Cardiovascular;  Laterality: N/A;    Social History   Socioeconomic History   Marital status: Married    Spouse name: Not on file   Number of children: Not on file   Years of education: Not on file   Highest education level: Not on file  Occupational History   Occupation: Full time  Tobacco Use   Smoking status: Never   Smokeless tobacco: Never  Vaping Use   Vaping Use: Never used  Substance and Sexual Activity   Alcohol use: Never   Drug use: Never   Sexual activity: Not Currently    Birth control/protection: Surgical    Comment: Hysterectomy  Other Topics Concern   Not on file  Social History Narrative   Married with 2 children   Gets regular exercise   Social Determinants of Health   Financial Resource Strain: Low Risk  (04/20/2022)   Overall Financial Resource Strain (CARDIA)    Difficulty of Paying  Living Expenses: Not very hard  Food Insecurity: No Food Insecurity (04/20/2022)   Hunger Vital Sign    Worried About Running Out of Food in the Last Year: Never true    Ran Out of Food in the Last Year: Never true  Transportation Needs: No Transportation Needs (04/20/2022)   PRAPARE - Hydrologist (Medical): No    Lack of Transportation (Non-Medical): No  Physical Activity: Inactive (04/20/2022)   Exercise Vital Sign    Days of Exercise per Week: 0 days    Minutes of Exercise per Session: 0 min  Stress: No Stress Concern Present (04/20/2022)   [place]    Feeling of Stress : Only a little  Social Connections: Socially Integrated (04/20/2022)   Social Connection and Isolation Panel [NHANES]    Frequency of Communication with Friends and Family: More than three times a week    Frequency of Social Gatherings with Friends and Family: More than three times a week    Attends Religious Services: More than 4 times per year    Active Member of [employer] or Organizations: Yes    Attends Archivist Meetings: More than  4 times per year    Marital Status: Married  Human resources officer Violence: Not on file    Family History  Problem Relation Age of Onset   Hyperlipidemia Mother    Hypertension Mother    Stroke Mother    Heart disease Father    Hyperlipidemia Father    Atrial fibrillation Father    Melanoma Father    Arrhythmia Sister        Atrial fibrillation   Heart disease Sister    Atrial fibrillation Sister    Heart attack Brother    Breast cancer Neg Hx      Current Outpatient Medications:    Calcium Carbonate-Vitamin D 600-200 MG-UNIT TABS, Take 1 tablet by mouth daily.  , Disp: , Rfl:    cetirizine (ZYRTEC) 10 MG tablet, Take 10 mg by mouth daily., Disp: , Rfl:    Cholecalciferol 25 MCG (1000 UT) tablet, Take 1,000 Units by mouth daily., Disp: , Rfl:    flecainide (TAMBOCOR) 50 MG  tablet, Take 1 tablet by mouth 2 (two) times daily., Disp: , Rfl:    LORazepam (ATIVAN) 0.5 MG tablet, Take 1 tablet (0.5 mg total) by mouth every 6 (six) hours as needed (Nausea or vomiting)., Disp: 30 tablet, Rfl: 0   midodrine (PROAMATINE) 10 MG tablet, Take by mouth., Disp: , Rfl:    mometasone (NASONEX) 50 MCG/ACT nasal spray, Place 2 sprays into the nose daily., Disp: , Rfl:    nystatin cream (MYCOSTATIN), Apply topically 2 (two) times daily., Disp: , Rfl:    OLANZapine (ZYPREXA) 10 MG tablet, TAKE 1 TABLET BY MOUTH EVERYDAY AT BEDTIME, Disp: 90 tablet, Rfl: 1   pantoprazole (PROTONIX) 40 MG tablet, Take 40 mg by mouth daily., Disp: , Rfl:    Saccharomyces boulardii (PROBIOTIC) 250 MG CAPS, Take 1 capsule by mouth daily., Disp: , Rfl:    simvastatin (ZOCOR) 20 MG tablet, TAKE 1 TABLET BY MOUTH EVERY EVENING., Disp: 30 tablet, Rfl: 0   traZODone (DESYREL) 50 MG tablet, Take 50 mg by mouth at bedtime as needed., Disp: , Rfl:    Zinc Citrate-Phytase (ZYTAZE) 25-500 MG CAPS, Take 1 Dose by mouth daily., Disp: , Rfl:    doxycycline (ADOXA) 100 MG tablet, Take 100 mg by mouth 2 (two) times daily. (Patient not taking: Reported on 07/12/2022), Disp: , Rfl:    ondansetron (ZOFRAN) 8 MG tablet, TAKE 1 TABLET BY MOUTH 2 TIMES DAILY AS NEEDED. START ON THE THIRD DAY AFTER CISPLATIN CHEMOTHERAPY. (Patient not taking: Reported on 07/26/2022), Disp: 18 tablet, Rfl: 3   ondansetron (ZOFRAN-ODT) 4 MG disintegrating tablet, Take 1 tablet (4 mg total) by mouth every 8 (eight) hours as needed for nausea or vomiting. (Patient not taking: Reported on 07/26/2022), Disp: 20 tablet, Rfl: 3 No current facility-administered medications for this visit.  Facility-Administered Medications Ordered in Other Visits:    fluorouracil (ADRUCIL) 4,000 mg in sodium chloride 0.9 % 70 mL chemo infusion, 2,400 mg/m2 (Treatment Plan Recorded), Intravenous, 1 day or 1 dose, Sindy Guadeloupe, MD, Infusion Verify at 07/26/22 1151    heparin lock flush 100 UNIT/ML injection, , , ,   Physical exam:  Vitals:   07/26/22 0909  BP: (!) 108/53  Pulse: 86  Resp: 18  Temp: (!) 97.4 F (36.3 C)  SpO2: 98%  Weight: 159 lb 11.2 oz (72.4 kg)   Physical Exam Constitutional:      General: She is not in acute distress. Cardiovascular:     Rate and Rhythm:  Normal rate and regular rhythm.     Heart sounds: Normal heart sounds.  Pulmonary:     Effort: Pulmonary effort is normal.     Breath sounds: Normal breath sounds.  Abdominal:     General: Bowel sounds are normal.     Palpations: Abdomen is soft.  Skin:    General: Skin is warm and dry.  Neurological:     Mental Status: She is alert and oriented to person, place, and time.         Latest Ref Rng & Units 07/26/2022    8:24 AM  CMP  Glucose 70 - 99 mg/dL 129   BUN 8 - 23 mg/dL 12   Creatinine 0.44 - 1.00 mg/dL 0.73   Sodium 135 - 145 mmol/L 137   Potassium 3.5 - 5.1 mmol/L 3.5   Chloride 98 - 111 mmol/L 104   CO2 22 - 32 mmol/L 25   Calcium 8.9 - 10.3 mg/dL 8.9   Total Protein 6.5 - 8.1 g/dL 6.9   Total Bilirubin 0.3 - 1.2 mg/dL 0.7   Alkaline Phos 38 - 126 U/L 98   AST 15 - 41 U/L 34   ALT 0 - 44 U/L 23       Latest Ref Rng & Units 07/26/2022    8:24 AM  CBC  WBC 4.0 - 10.5 K/uL 4.9   Hemoglobin 12.0 - 15.0 g/dL 11.7   Hematocrit 36.0 - 46.0 % 35.1   Platelets 150 - 400 K/uL 75     No images are attached to the encounter.  DG Chest Portable 1 View  Result Date: 07/01/2022 CLINICAL DATA:  Shortness of breath. EXAM: PORTABLE CHEST 1 VIEW COMPARISON:  May 03, 2022. FINDINGS: Stable cardiomediastinal silhouette. Hypoinflation of the lungs is noted with mild bibasilar subsegmental atelectasis. Right internal jugular Port-A-Cath is unchanged. Bony thorax is unremarkable. IMPRESSION: Hypoinflation of the lungs with mild bibasilar subsegmental atelectasis. Electronically Signed   By: Marijo Conception M.D.   On: 07/01/2022 13:42     Assessment and  plan- Patient is a 61 y.o. 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  She is here for on treatment assessment prior to cycle 6 of palliative FOLFOX chemotherapy  Platelets are 75 today.  I am holding her oxaliplatin today.  She will receive bolus and infusional 5-FU chemotherapy today and I will see her back in 2 weeks for cycle 7.  She has CT chest abdomen and pelvis with contrast scheduled next week.  Chemo-induced anemia: Overall stable continue to monitor  Chemo-induced peripheral neuropathy: Mild and self-limited.  She is not on any medications presently   Visit Diagnosis 1. Primary malignant neuroendocrine tumor of esophagus ([place])   2. Encounter for antineoplastic chemotherapy   3. Chemotherapy induced neutropenia (HCC)   4. Chemotherapy-induced thrombocytopenia      Dr. Randa Evens, MD, MPH [facility] [phone] 07/26/2022 12:14 PM

## 2022-07-26 NOTE — Progress Notes (Signed)
Nutrition Follow-up:  Patient with large cell neuroendocrine tumor vs adenocarcinoma of GE junction likely stage IV.  Patient on palliative folfox.  Noted ER visit with afib, UTI.    Met with patient and husband during infusion.  Patient reports that her appetite is good.  Still working to get good sources of protein in diet.  Reports weakness in legs.    Medications: reviewed  Labs: glucose 129  Anthropometrics:   Weight 159 lb 11.2 oz today  158 lb on 10/17 157 lb on 10/3 153 lb on 8/29 149 lb on 8/14 153 lb on 7/28   NUTRITION DIAGNOSIS: Inadequate oral intake improved    INTERVENTION:  Discussed referral to OT, Maureen for screening for leg weakness.  Patient will think about it and let Dr Elroy Channel team know if wants a referral.  Continue to encourage good nutrition including good sources of protein    MONITORING, EVALUATION, GOAL: weight trends, intake   NEXT VISIT: Tuesday, Dec 12 during infusion  Phyllis Abelson B. Zenia Resides, [place], [place] Registered Dietitian [phone]

## 2022-07-26 NOTE — Progress Notes (Signed)
Pt states no new concerns.

## 2022-07-27 ENCOUNTER — Other Ambulatory Visit: Payer: Self-pay

## 2022-07-27 LAB — CEA: CEA: 2.3 ng/mL (ref 0.0–4.7)

## 2022-07-27 MED FILL — Iron Sucrose Inj 20 MG/ML (Fe Equiv): INTRAVENOUS | Qty: 10 | Status: AC

## 2022-07-28 ENCOUNTER — Inpatient Hospital Stay: Payer: BC Managed Care – PPO

## 2022-07-28 VITALS — BP 93/59 | HR 88 | Resp 18

## 2022-07-28 DIAGNOSIS — Z5111 Encounter for antineoplastic chemotherapy: Secondary | ICD-10-CM | POA: Diagnosis not present

## 2022-07-28 DIAGNOSIS — C7A8 Other malignant neuroendocrine tumors: Secondary | ICD-10-CM

## 2022-07-28 MED ORDER — SODIUM CHLORIDE 0.9% FLUSH
10.0000 mL | INTRAVENOUS | Status: DC | PRN
  Administered 2022-07-28: 10 mL
  Filled 2022-07-28: qty 10

## 2022-07-28 MED ORDER — PEGFILGRASTIM-CBQV 6 MG/0.6ML ~~LOC~~ SOSY
6.0000 mg | PREFILLED_SYRINGE | Freq: Once | SUBCUTANEOUS | Status: AC
  Administered 2022-07-28: 6 mg via SUBCUTANEOUS
  Filled 2022-07-28: qty 0.6

## 2022-07-28 MED ORDER — HEPARIN SOD (PORK) LOCK FLUSH 100 UNIT/ML IV SOLN
500.0000 [IU] | Freq: Once | INTRAVENOUS | Status: AC | PRN
  Administered 2022-07-28: 500 [IU]
  Filled 2022-07-28: qty 5

## 2022-08-01 ENCOUNTER — Ambulatory Visit
Admission: RE | Admit: 2022-08-01 | Discharge: 2022-08-01 | Disposition: A | Discharge status: Home / Self Care | Payer: BC Managed Care – PPO | Source: Ambulatory Visit | Attending: Oncology | Admitting: Oncology

## 2022-08-01 DIAGNOSIS — C7A8 Other malignant neuroendocrine tumors: Secondary | ICD-10-CM | POA: Diagnosis present

## 2022-08-01 MED ORDER — IOHEXOL 300 MG/ML  SOLN
100.0000 mL | Freq: Once | INTRAMUSCULAR | Status: AC | PRN
  Administered 2022-08-01: 100 mL via INTRAVENOUS

## 2022-08-08 MED FILL — Dexamethasone Sodium Phosphate Inj 100 MG/10ML: INTRAMUSCULAR | Qty: 1 | Status: AC

## 2022-08-09 ENCOUNTER — Encounter: Payer: Self-pay | Admitting: Oncology

## 2022-08-09 ENCOUNTER — Inpatient Hospital Stay: Payer: BC Managed Care – PPO

## 2022-08-09 ENCOUNTER — Inpatient Hospital Stay (HOSPITAL_BASED_OUTPATIENT_CLINIC_OR_DEPARTMENT_OTHER): Payer: BC Managed Care – PPO | Admitting: Oncology

## 2022-08-09 VITALS — BP 108/68 | HR 72 | Temp 98.3°F | Resp 16 | Wt 159.5 lb

## 2022-08-09 DIAGNOSIS — D649 Anemia, unspecified: Secondary | ICD-10-CM

## 2022-08-09 DIAGNOSIS — Z5111 Encounter for antineoplastic chemotherapy: Secondary | ICD-10-CM | POA: Diagnosis not present

## 2022-08-09 DIAGNOSIS — T451X5A Adverse effect of antineoplastic and immunosuppressive drugs, initial encounter: Secondary | ICD-10-CM | POA: Diagnosis not present

## 2022-08-09 DIAGNOSIS — C7A8 Other malignant neuroendocrine tumors: Secondary | ICD-10-CM

## 2022-08-09 DIAGNOSIS — D6959 Other secondary thrombocytopenia: Secondary | ICD-10-CM | POA: Diagnosis not present

## 2022-08-09 LAB — CBC WITH DIFFERENTIAL/PLATELET
Abs Immature Granulocytes: 0.01 10*3/uL (ref 0.00–0.07)
Basophils Absolute: 0 10*3/uL (ref 0.0–0.1)
Basophils Relative: 1 %
Eosinophils Absolute: 0.2 10*3/uL (ref 0.0–0.5)
Eosinophils Relative: 6 %
HCT: 36.7 % (ref 36.0–46.0)
Hemoglobin: 12.4 g/dL (ref 12.0–15.0)
Immature Granulocytes: 0 %
Lymphocytes Relative: 42 %
Lymphs Abs: 1.6 10*3/uL (ref 0.7–4.0)
MCH: 35.3 pg — ABNORMAL HIGH (ref 26.0–34.0)
MCHC: 33.8 g/dL (ref 30.0–36.0)
MCV: 104.6 fL — ABNORMAL HIGH (ref 80.0–100.0)
Monocytes Absolute: 0.5 10*3/uL (ref 0.1–1.0)
Monocytes Relative: 14 %
Neutro Abs: 1.3 10*3/uL — ABNORMAL LOW (ref 1.7–7.7)
Neutrophils Relative %: 37 %
Platelets: 98 10*3/uL — ABNORMAL LOW (ref 150–400)
RBC: 3.51 MIL/uL — ABNORMAL LOW (ref 3.87–5.11)
RDW: 17.9 % — ABNORMAL HIGH (ref 11.5–15.5)
WBC: 3.6 10*3/uL — ABNORMAL LOW (ref 4.0–10.5)
nRBC: 0 % (ref 0.0–0.2)

## 2022-08-09 LAB — COMPREHENSIVE METABOLIC PANEL
ALT: 26 U/L (ref 0–44)
AST: 36 U/L (ref 15–41)
Albumin: 3.8 g/dL (ref 3.5–5.0)
Alkaline Phosphatase: 89 U/L (ref 38–126)
Anion gap: 8 (ref 5–15)
BUN: 10 mg/dL (ref 8–23)
CO2: 25 mmol/L (ref 22–32)
Calcium: 8.9 mg/dL (ref 8.9–10.3)
Chloride: 105 mmol/L (ref 98–111)
Creatinine, Ser: 0.72 mg/dL (ref 0.44–1.00)
GFR, Estimated: >60 mL/min (ref >60)
Glucose, Bld: 118 mg/dL — ABNORMAL HIGH (ref 70–99)
Potassium: 3.4 mmol/L — ABNORMAL LOW (ref 3.5–5.1)
Sodium: 138 mmol/L (ref 135–145)
Total Bilirubin: 0.9 mg/dL (ref 0.3–1.2)
Total Protein: 7.1 g/dL (ref 6.5–8.1)

## 2022-08-09 MED ORDER — SODIUM CHLORIDE 0.9 % IV SOLN
10.0000 mg | Freq: Once | INTRAVENOUS | Status: AC
  Administered 2022-08-09: 10 mg via INTRAVENOUS
  Filled 2022-08-09: qty 10

## 2022-08-09 MED ORDER — OXALIPLATIN CHEMO INJECTION 100 MG/20ML
65.0000 mg/m2 | Freq: Once | INTRAVENOUS | Status: AC
  Administered 2022-08-09: 110 mg via INTRAVENOUS
  Filled 2022-08-09: qty 20

## 2022-08-09 MED ORDER — SODIUM CHLORIDE 0.9 % IV SOLN
2400.0000 mg/m2 | INTRAVENOUS | Status: DC
  Administered 2022-08-09: 4000 mg via INTRAVENOUS
  Filled 2022-08-09: qty 80

## 2022-08-09 MED ORDER — DEXTROSE 5 % IV SOLN
Freq: Once | INTRAVENOUS | Status: AC
  Filled 2022-08-09: qty 250

## 2022-08-09 MED ORDER — LEUCOVORIN CALCIUM INJECTION 350 MG
400.0000 mg/m2 | Freq: Once | INTRAVENOUS | Status: AC
  Administered 2022-08-09: 664 mg via INTRAVENOUS
  Filled 2022-08-09: qty 33.2

## 2022-08-09 MED ORDER — FLUOROURACIL CHEMO INJECTION 2.5 GM/50ML
400.0000 mg/m2 | Freq: Once | INTRAVENOUS | Status: AC
  Administered 2022-08-09: 650 mg via INTRAVENOUS
  Filled 2022-08-09: qty 13

## 2022-08-09 MED ORDER — PALONOSETRON HCL INJECTION 0.25 MG/5ML
0.2500 mg | Freq: Once | INTRAVENOUS | Status: AC
  Administered 2022-08-09: 0.25 mg via INTRAVENOUS
  Filled 2022-08-09: qty 5

## 2022-08-09 NOTE — Progress Notes (Signed)
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 Haig Prophet on 03/01/2022 EGD showed a medium-sized ulcerating mass with no bleeding or stigmata of recent bleeding at the GE junction..  Mass was partially obstructing.  There is also mention of another ulcerated noncircumferential mass with oozing bleeding found at the GE junction with extension into the cardia.  Both these masses were biopsied.  Pathology was pending at the time of my visit with the patient and was reported to be a poorly differentiated carcinoma.  Patient is currently able to swallow soft foods without much difficulty.  However more solid foods occasionally feel stuck.    PET was denied by insurance.  CT chest abdomen and pelvis with contrast showed asymmetric distal esophageal wall thickening with thickening extending around the lesser curvature of the stomach through the gastric cardia and proximal fundus.  Paratracheal/paraesophageal, gastrohepatic and PET retroperitoneal adenopathy.  Left periaortic lymph node measuring 17 mm at the level of left renal hilum.   Final pathology showed there were 2 specimens collected.  Specimen a was poorly differentiated carcinoma with ulceration and necrosis.v verys weakly positive for synaptophysin 50% of the cells.  Cells negative for CK7 and CK23 for the BF1 CDX2 100 Melan-A HMB45  and CD20.  Specimen B showed large cell neuroendocrine carcinoma with neoplastic cells positive for CD56 with diffuse strong staining and majority of the neoplastic cells were also positive for synaptophysin with weak staining Ki-67 95%   Patient went to [facility] for [facility] opinion.  Pathology was also looked at but was signed out as poorly differentiated adenocarcinoma at [facility].  Patient did not have a good response to cisplatin etoposide Tecentriq regimen.  She was therefore switched to FOLFOX chemotherapy.  CPS score was 1 and therefore Keytruda was excluded.  HER2 negative.  Interval history-tolerating treatments well so far other than mild fatigue she denies other complaints at this time.  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 Negative for chills, fever and weight loss.  HENT:  Negative for congestion, ear discharge and nosebleeds.   Eyes:  Negative for blurred vision.  Respiratory:  Negative for cough, hemoptysis, sputum production, shortness of breath and wheezing.   Cardiovascular:  Negative for chest pain, palpitations, orthopnea and claudication.  Gastrointestinal:  Negative for abdominal pain, blood in stool, constipation, diarrhea, heartburn, melena, nausea and vomiting.  Genitourinary:  Negative for dysuria, flank pain, frequency, hematuria and urgency.  Musculoskeletal:  Negative for back pain, joint pain and myalgias.  Skin:  Negative for rash.  Neurological:  Negative for dizziness, tingling, focal weakness, seizures, weakness and headaches.  Endo/Heme/Allergies:  Does not bruise/bleed easily.  Psychiatric/Behavioral:  Negative for depression and suicidal ideas. The patient does not have insomnia.       Allergies  Allergen Reactions  Sulfa Antibiotics Hives    Says her tongue swelled a bit   Sulfonamide Derivatives Swelling    Tongue  swelling     Past Medical History:  Diagnosis Date   Anemia    Arthritis    Atrial fibrillation ([place])    B12 deficiency    Dysrhythmia    Hyperlipidemia    Hypothyroidism    Menopause    Palpitations    Hx of, 25 years ago   Vertigo      Past Surgical History:  Procedure Laterality Date   BILATERAL SALPINGOOPHORECTOMY  2008   DILATION AND CURETTAGE OF UTERUS  08/24/2006   ESOPHAGOGASTRODUODENOSCOPY (EGD) WITH PROPOFOL N/A 03/01/2022   Procedure: ESOPHAGOGASTRODUODENOSCOPY (EGD) WITH PROPOFOL;  Surgeon: Lesly Rubenstein, MD;  Location: [facility] ENDOSCOPY;  Service: Endoscopy;  Laterality: N/A;   ESOPHAGOGASTRODUODENOSCOPY (EGD) WITH PROPOFOL N/A 05/04/2022   Procedure: ESOPHAGOGASTRODUODENOSCOPY (EGD) WITH PROPOFOL;  Surgeon: [name], MD;  Location: [facility] ENDOSCOPY;  Service: Gastroenterology;  Laterality: N/A;   incision tendon sheath for trigger finger Right    ring finger   LAPAROSCOPIC SUPRACERVICAL HYSTERECTOMY  2008   Dr. Ammie Dalton; with BSO   NOSE SURGERY  03/2008   PORTA CATH INSERTION N/A 03/21/2022   Procedure: PORTA CATH INSERTION;  Surgeon: Algernon Huxley, MD;  Location: [facility];  Service: Cardiovascular;  Laterality: N/A;    Social History   Socioeconomic History   Marital status: Married    Spouse name: Not on file   Number of children: Not on file   Years of education: Not on file   Highest education level: Not on file  Occupational History   Occupation: Full time  Tobacco Use   Smoking status: Never   Smokeless tobacco: Never  Vaping Use   Vaping Use: Never used  Substance and Sexual Activity   Alcohol use: Never   Drug use: Never   Sexual activity: Not Currently    Birth control/protection: Surgical    Comment: Hysterectomy  Other Topics Concern   Not on file  Social History Narrative   Married with 2 children   Gets regular exercise   Social Determinants of Health   Financial Resource Strain: Low Risk  (04/20/2022)   Overall  Financial Resource Strain (CARDIA)    Difficulty of Paying Living Expenses: Not very hard  Food Insecurity: No Food Insecurity (04/20/2022)   Hunger Vital Sign    Worried About Running Out of Food in the Last Year: Never true    Ran Out of Food in the Last Year: Never true  Transportation Needs: No Transportation Needs (04/20/2022)   PRAPARE - Hydrologist (Medical): No    Lack of Transportation (Non-Medical): No  Physical Activity: Inactive (04/20/2022)   Exercise Vital Sign    Days of Exercise per Week: 0 days    Minutes of Exercise per Session: 0 min  Stress: No Stress Concern Present (04/20/2022)   [place]    Feeling of Stress : Only a little  Social Connections: Socially Integrated (04/20/2022)   Social Connection and Isolation Panel [NHANES]    Frequency of Communication with Friends and Family: More than three times a week    Frequency of Social Gatherings with Friends and Family: More than three times a week    Attends Religious Services: More than 4 times per year    Active Member of Clubs or Organizations: Yes    Attends  Music therapist: More than 4 times per year    Marital Status: Married  Human resources officer Violence: Not on file    Family History  Problem Relation Age of Onset   Hyperlipidemia Mother    Hypertension Mother    Stroke Mother    Heart disease Father    Hyperlipidemia Father    Atrial fibrillation Father    Melanoma Father    Arrhythmia Sister        Atrial fibrillation   Heart disease Sister    Atrial fibrillation Sister    Heart attack Brother    Breast cancer Neg Hx      Current Outpatient Medications:    Calcium Carbonate-Vitamin D 600-200 MG-UNIT TABS, Take 1 tablet by mouth daily.  , Disp: , Rfl:    cetirizine (ZYRTEC) 10 MG tablet, Take 10 mg by mouth daily., Disp: , Rfl:    Cholecalciferol 25 MCG (1000 UT) tablet, Take 1,000 Units by  mouth daily., Disp: , Rfl:    diphenhydrAMINE (BENADRYL) 25 mg capsule, Take 25 mg by mouth every 6 (six) hours as needed., Disp: , Rfl:    flecainide (TAMBOCOR) 50 MG tablet, Take 1 tablet by mouth 2 (two) times daily., Disp: , Rfl:    LORazepam (ATIVAN) 0.5 MG tablet, Take 1 tablet (0.5 mg total) by mouth every 6 (six) hours as needed (Nausea or vomiting)., Disp: 30 tablet, Rfl: 0   midodrine (PROAMATINE) 10 MG tablet, Take by mouth., Disp: , Rfl:    mometasone (NASONEX) 50 MCG/ACT nasal spray, Place 2 sprays into the nose daily., Disp: , Rfl:    nystatin cream (MYCOSTATIN), Apply topically 2 (two) times daily., Disp: , Rfl:    OLANZapine (ZYPREXA) 10 MG tablet, TAKE 1 TABLET BY MOUTH EVERYDAY AT BEDTIME, Disp: 90 tablet, Rfl: 1   ondansetron (ZOFRAN) 8 MG tablet, TAKE 1 TABLET BY MOUTH 2 TIMES DAILY AS NEEDED. START ON THE THIRD DAY AFTER CISPLATIN CHEMOTHERAPY., Disp: 18 tablet, Rfl: 3   ondansetron (ZOFRAN-ODT) 4 MG disintegrating tablet, Take 1 tablet (4 mg total) by mouth every 8 (eight) hours as needed for nausea or vomiting., Disp: 20 tablet, Rfl: 3   pantoprazole (PROTONIX) 40 MG tablet, Take 40 mg by mouth daily., Disp: , Rfl:    Saccharomyces boulardii (PROBIOTIC) 250 MG CAPS, Take 1 capsule by mouth daily., Disp: , Rfl:    simvastatin (ZOCOR) 20 MG tablet, TAKE 1 TABLET BY MOUTH EVERY EVENING., Disp: 30 tablet, Rfl: 0   traZODone (DESYREL) 50 MG tablet, Take 50 mg by mouth at bedtime as needed., Disp: , Rfl:    Zinc Citrate-Phytase (ZYTAZE) 25-500 MG CAPS, Take 1 Dose by mouth daily., Disp: , Rfl:  No current facility-administered medications for this visit.  Facility-Administered Medications Ordered in Other Visits:    fluorouracil (ADRUCIL) 4,000 mg in sodium chloride 0.9 % 70 mL chemo infusion, 2,400 mg/m2 (Treatment Plan Recorded), Intravenous, 1 day or 1 dose, Sindy Guadeloupe, MD   fluorouracil (ADRUCIL) chemo injection 650 mg, 400 mg/m2 (Treatment Plan Recorded), Intravenous,  Once, Sindy Guadeloupe, MD   heparin lock flush 100 UNIT/ML injection, , , ,    leucovorin 664 mg in dextrose 5 % 250 mL infusion, 400 mg/m2 (Treatment Plan Recorded), Intravenous, Once, Sindy Guadeloupe, MD, Last Rate: 142 mL/hr at 08/09/22 1114, 664 mg at 08/09/22 1114   oxaliplatin (ELOXATIN) 110 mg in dextrose 5 % 500 mL chemo infusion, 65 mg/m2 (Treatment Plan Recorded), Intravenous, Once, Randa Evens  C, MD, Last Rate: 261 mL/hr at 08/09/22 1117, 110 mg at 08/09/22 1117  Physical exam:  Vitals:   08/09/22 0920  BP: 108/68  Pulse: 72  Resp: 16  Temp: 98.3 F (36.8 C)  TempSrc: Tympanic  SpO2: 99%  Weight: 159 lb 8 oz (72.3 kg)   Physical Exam Cardiovascular:     Rate and Rhythm: Normal rate and regular rhythm.     Heart sounds: Normal heart sounds.  Pulmonary:     Effort: Pulmonary effort is normal.     Breath sounds: Normal breath sounds.  Skin:    General: Skin is warm and dry.  Neurological:     Mental Status: She is alert and oriented to person, place, and time.         Latest Ref Rng & Units 08/09/2022    7:51 AM  CMP  Glucose 70 - 99 mg/dL 118   BUN 8 - 23 mg/dL 10   Creatinine 0.44 - 1.00 mg/dL 0.72   Sodium 135 - 145 mmol/L 138   Potassium 3.5 - 5.1 mmol/L 3.4   Chloride 98 - 111 mmol/L 105   CO2 22 - 32 mmol/L 25   Calcium 8.9 - 10.3 mg/dL 8.9   Total Protein 6.5 - 8.1 g/dL 7.1   Total Bilirubin 0.3 - 1.2 mg/dL 0.9   Alkaline Phos 38 - 126 U/L 89   AST 15 - 41 U/L 36   ALT 0 - 44 U/L 26       Latest Ref Rng & Units 08/09/2022    7:51 AM  CBC  WBC 4.0 - 10.5 K/uL 3.6   Hemoglobin 12.0 - 15.0 g/dL 12.4   Hematocrit 36.0 - 46.0 % 36.7   Platelets 150 - 400 K/uL 98     No images are attached to the encounter.  CT CHEST ABDOMEN PELVIS W CONTRAST  Result Date: 08/01/2022 CLINICAL DATA:  Esophageal cancer, follow-up.  * Tracking Code: BO * EXAM: CT CHEST, ABDOMEN, AND PELVIS WITH CONTRAST TECHNIQUE: Multidetector CT imaging of the chest, abdomen and  pelvis was performed following the standard protocol during bolus administration of intravenous contrast. RADIATION DOSE REDUCTION: This exam was performed according to the departmental dose-optimization program which includes automated exposure control, adjustment of the mA and/or kV according to patient size and/or use of iterative reconstruction technique. CONTRAST:  158m OMNIPAQUE IOHEXOL 300 MG/ML  SOLN COMPARISON:  Multiple priors including CT May 03, 2022 PET-CT March 22, 2022 and CT chest abdomen pelvis March 17, 2022 FINDINGS: CT CHEST FINDINGS Cardiovascular: Right chest Port-A-Cath with tip in the right atrium. Aortic atherosclerosis. No central pulmonary embolus on this nondedicated study. Normal size heart. No significant pericardial effusion/thickening. Mediastinum/Nodes: Decreased asymmetric distal esophageal wall thickening which extends into the lesser curvature of the stomach for instance on image 46/2. Decreased size of the mediastinal lymph nodes.  For reference: -left upper paratracheal lymph node near the thoracic inlet now measures 5 mm in short axis on image 9/2 previously 11 mm. -left lower paraesophageal lymph node near the hiatus now measures 4 mm in short axis on image 48/2 previously 11 mm. No pathologically enlarged axillary or hilar lymph nodes. Lungs/Pleura: Biapical pleuroparenchymal scarring. No suspicious pulmonary nodules or masses. Scattered areas of subsegmental atelectasis/scarring. No pleural effusion. No pneumothorax. Musculoskeletal: No aggressive lytic or blastic lesion of bone. CT ABDOMEN PELVIS FINDINGS Hepatobiliary: Probable hepatic steatosis. No suspicious hepatic lesion. Gallbladder is unremarkable. No biliary ductal dilation. Pancreas: No pancreatic ductal dilation or  evidence of acute inflammation. Spleen: No splenomegaly. Adrenals/Urinary Tract: Bilateral adrenal glands appear normal. No hydronephrosis. Kidneys demonstrate symmetric enhancement. Urinary bladder is  unremarkable for degree of distension. Stomach/Bowel: Radiopaque enteric contrast material traverses the splenic flexure. No pathologic dilation of small or large bowel. The appendix and terminal ileum are within normal limits. Moderate volume of formed stool in the colon. No evidence of acute bowel inflammation. Vascular/Lymphatic: Aortic atherosclerosis. The portal, splenic and superior mesenteric veins are patent. Decreased size of the gastrohepatic and retroperitoneal adenopathy. For reference: -gastrohepatic ligament lymph node measures 6 mm in short axis on image 53/2 previously 13 mm. -left periaortic lymph node at the level of the left renal hilum measures 7 mm in short axis on image 66/2 previously 17 mm. Reproductive: Status post hysterectomy. No adnexal masses. Other: No significant abdominopelvic free fluid. No discrete peritoneal or omental nodularity. Musculoskeletal: No aggressive lytic or blastic lesion of bone. IMPRESSION: 1. Decreased asymmetric distal proximal gastric wall thickening, consistent with treatment response. 2. Decreased thoracoabdominal adenopathy, consistent with treatment response. 3. No evidence of new or progressive disease in the chest, abdomen or pelvis. 4. Probable hepatic steatosis. 5. Moderate volume of formed stool in the colon. Correlate for constipation. 6.  Aortic Atherosclerosis (ICD10-I70.0). Electronically Signed   By: Dahlia Bailiff M.D.   On: 08/01/2022 10:08     Assessment and plan- Patient is a 61 y.o. 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  She is here for on treatment assessment prior to cycle 7 of palliative FOLFOX chemotherapy  Counts okay to proceed with cycle 7 of palliative FOLFOX chemotherapy today.  Platelets are 94 today and therefore I will offer her oxaliplatin as well.  I have reviewed CT chest abdomenPelvis images independently and discussed findings with the patient which overall shows good response to  treatment and reduction in the size of mediastinal lymph nodes.  There is also reduction in the size of retroperitoneal and gastrohepatic adenopathy.  My plan is therefore to continue FOLFOX chemotherapy until progression or toxicity.  I will reach out to Dr. Elenor Quinones again to see if she would be a candidate for surgery at this time.  However given the fact that she has stage IV disease with retroperitoneal lymph node involvement and not sure if surgery would be an option.  I will see her back in 2 weeks for cycle 8 of FOLFOX chemotherapy   Visit Diagnosis 1. 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 Encounter for antineoplastic chemotherapy   3. Chemotherapy-induced thrombocytopenia      Dr. Randa Evens, MD, MPH [facility] at [facility] [phone] 08/09/2022 1:10 PM

## 2022-08-09 NOTE — Patient Instructions (Signed)
[facility] CANCER CTR AT [place]  Discharge Instructions: Thank you for choosing [facility] to provide your oncology and hematology care.  If you have a lab appointment with the [facility], please go directly to the [facility] and check in at the registration area.  Wear comfortable clothing and clothing appropriate for easy access to any Portacath or PICC line.   We strive to give you quality time with your provider. You may need to reschedule your appointment if you arrive late (15 or more minutes).  Arriving late affects you and other patients whose appointments are after yours.  Also, if you miss three or more appointments without notifying the office, you may be dismissed from the clinic at the provider's discretion.      For prescription refill requests, have your pharmacy contact our office and allow 72 hours for refills to be completed.    Today you received the following chemotherapy and/or immunotherapy agents OXALIPLATIN, LEUCOVORIN, 5 FU   To help prevent nausea and vomiting after your treatment, we encourage you to take your nausea medication as directed.  BELOW ARE SYMPTOMS THAT SHOULD BE REPORTED IMMEDIATELY: *FEVER GREATER THAN 100.4 F (38 C) OR HIGHER *CHILLS OR SWEATING *NAUSEA AND VOMITING THAT IS NOT CONTROLLED WITH YOUR NAUSEA MEDICATION *UNUSUAL SHORTNESS OF BREATH *UNUSUAL BRUISING OR BLEEDING *URINARY PROBLEMS (pain or burning when urinating, or frequent urination) *BOWEL PROBLEMS (unusual diarrhea, constipation, pain near the anus) TENDERNESS IN MOUTH AND THROAT WITH OR WITHOUT PRESENCE OF ULCERS (sore throat, sores in mouth, or a toothache) UNUSUAL RASH, SWELLING OR PAIN  UNUSUAL VAGINAL DISCHARGE OR ITCHING   Items with * indicate a potential emergency and should be followed up as soon as possible or go to the Emergency Department if any problems should occur.  Please show the CHEMOTHERAPY ALERT CARD or IMMUNOTHERAPY ALERT CARD  at check-in to the Emergency Department and triage nurse.  Should you have questions after your visit or need to cancel or reschedule your appointment, please contact [facility] CANCER [place] AT [place]  [phone] and follow the prompts.  Office hours are 8:00 a.m. to 4:30 p.m. Monday - Friday. Please note that voicemails left after 4:00 p.m. may not be returned until the following business day.  We are closed weekends and major holidays. You have access to a nurse at all times for urgent questions. Please call the main number to the clinic [phone] and follow the prompts.  For any non-urgent questions, you may also contact your provider using MyChart. We now offer e-Visits for anyone 36 and older to request care online for non-urgent symptoms. For details visit [email or web address].   Also download the MyChart app! Go to the app store, search "MyChart", open the app, select [place], and log in with your MyChart username and password.  Masks are optional in the cancer centers. If you would like for your care team to wear a mask while they are taking care of you, please let them know. For doctor visits, patients may have with them one support person who is at least 61 years old. At this time, visitors are not allowed in the infusion area.  Oxaliplatin Injection What is this medication? OXALIPLATIN (ox AL i PLA tin) treats some types of cancer. It works by slowing down the growth of cancer cells. This medicine may be used for other purposes; ask your health care provider or pharmacist if you have questions. COMMON BRAND NAME(S): Eloxatin What should I tell my care  team before I take this medication? They need to know if you have any of these conditions: Heart disease History of irregular heartbeat or rhythm Liver disease Low blood cell levels (white cells, red cells, and platelets) Lung or breathing disease, such as asthma Take medications that treat or prevent blood  clots Tingling of the fingers, toes, or other nerve disorder An unusual or allergic reaction to oxaliplatin, other medications, foods, dyes, or preservatives If you or your partner are pregnant or trying to get pregnant Breast-feeding How should I use this medication? This medication is injected into a vein. It is given by your care team in a hospital or clinic setting. Talk to your care team about the use of this medication in children. Special care may be needed. Overdosage: If you think you have taken too much of this medicine contact a poison control center or emergency room at once. NOTE: This medicine is only for you. Do not share this medicine with others. What if I miss a dose? Keep appointments for follow-up doses. It is important not to miss a dose. Call your care team if you are unable to keep an appointment. What may interact with this medication? Do not take this medication with any of the following: Cisapride Dronedarone Pimozide Thioridazine This medication may also interact with the following: Aspirin and aspirin-like medications Certain medications that treat or prevent blood clots, such as warfarin, apixaban, dabigatran, and rivaroxaban Cisplatin Cyclosporine Diuretics Medications for infection, such as acyclovir, adefovir, amphotericin B, bacitracin, cidofovir, foscarnet, ganciclovir, gentamicin, pentamidine, vancomycin NSAIDs, medications for pain and inflammation, such as ibuprofen or naproxen Other medications that cause heart rhythm changes Pamidronate Zoledronic acid This list may not describe all possible interactions. Give your health care provider a list of all the medicines, herbs, non-prescription drugs, or dietary supplements you use. Also tell them if you smoke, drink alcohol, or use illegal drugs. Some items may interact with your medicine. What should I watch for while using this medication? Your condition will be monitored carefully while you are  receiving this medication. You may need blood work while taking this medication. This medication may make you feel generally unwell. This is not uncommon as chemotherapy can affect healthy cells as well as cancer cells. Report any side effects. Continue your course of treatment even though you feel ill unless your care team tells you to stop. This medication may increase your risk of getting an infection. Call your care team for advice if you get a fever, chills, sore throat, or other symptoms of a cold or flu. Do not treat yourself. Try to avoid being around people who are sick. Avoid taking medications that contain aspirin, acetaminophen, ibuprofen, naproxen, or ketoprofen unless instructed by your care team. These medications may hide a fever. Be careful brushing or flossing your teeth or using a toothpick because you may get an infection or bleed more easily. If you have any dental work done, tell your dentist you are receiving this medication. This medication can make you more sensitive to cold. Do not drink cold drinks or use ice. Cover exposed skin before coming in contact with cold temperatures or cold objects. When out in cold weather wear warm clothing and cover your mouth and nose to warm the air that goes into your lungs. Tell your care team if you get sensitive to the cold. Talk to your care team if you or your partner are pregnant or think either of you might be pregnant. This medication can cause  serious birth defects if taken during pregnancy and for 9 months after the last dose. A negative pregnancy test is required before starting this medication. A reliable form of contraception is recommended while taking this medication and for 9 months after the last dose. Talk to your care team about effective forms of contraception. Do not father a child while taking this medication and for 6 months after the last dose. Use a condom while having sex during this time period. Do not breastfeed while  taking this medication and for 3 months after the last dose. This medication may cause infertility. Talk to your care team if you are concerned about your fertility. What side effects may I notice from receiving this medication? Side effects that you should report to your care team as soon as possible: Allergic reactions--skin rash, itching, hives, swelling of the face, lips, tongue, or throat Bleeding--bloody or black, tar-like stools, vomiting blood or brown material that looks like coffee grounds, red or dark brown urine, small red or purple spots on skin, unusual bruising or bleeding Dry cough, shortness of breath or trouble breathing Heart rhythm changes--fast or irregular heartbeat, dizziness, feeling faint or lightheaded, chest pain, trouble breathing Infection--fever, chills, cough, sore throat, wounds that don't heal, pain or trouble when passing urine, general feeling of discomfort or being unwell Liver injury--right upper belly pain, loss of appetite, nausea, light-colored stool, dark yellow or brown urine, yellowing skin or eyes, unusual weakness or fatigue Low red blood cell level--unusual weakness or fatigue, dizziness, headache, trouble breathing Muscle injury--unusual weakness or fatigue, muscle pain, dark yellow or brown urine, decrease in amount of urine Pain, tingling, or numbness in the hands or feet Sudden and severe headache, confusion, change in vision, seizures, which may be signs of posterior reversible encephalopathy syndrome (PRES) Unusual bruising or bleeding Side effects that usually do not require medical attention (report to your care team if they continue or are bothersome): Diarrhea Nausea Pain, redness, or swelling with sores inside the mouth or throat Unusual weakness or fatigue Vomiting This list may not describe all possible side effects. Call your doctor for medical advice about side effects. You may report side effects to FDA at 1-800-FDA-1088. Where  should I keep my medication? This medication is given in a hospital or clinic. It will not be stored at home. NOTE: This sheet is a summary. It may not cover all possible information. If you have questions about this medicine, talk to your doctor, pharmacist, or health care provider.  2023 Elsevier/Gold Standard (2007-10-20 00:00:00)  Leucovorin Injection What is this medication? LEUCOVORIN (loo koe VOR in) prevents side effects from certain medications, such as methotrexate. It works by increasing folate levels. This helps protect healthy cells in your body. It may also be used to treat anemia caused by low levels of folate. It can also be used with fluorouracil, a type of chemotherapy, to treat colorectal cancer. It works by increasing the effects of fluorouracil in the body. This medicine may be used for other purposes; ask your health care provider or pharmacist if you have questions. What should I tell my care team before I take this medication? They need to know if you have any of these conditions: Anemia from low levels of vitamin B12 in the blood An unusual or allergic reaction to leucovorin, folic acid, other medications, foods, dyes, or preservatives Pregnant or trying to get pregnant Breastfeeding How should I use this medication? This medication is injected into a vein or a muscle.  It is given by your care team in a hospital or clinic setting. Talk to your care team about the use of this medication in children. Special care may be needed. Overdosage: If you think you have taken too much of this medicine contact a poison control center or emergency room at once. NOTE: This medicine is only for you. Do not share this medicine with others. What if I miss a dose? Keep appointments for follow-up doses. It is important not to miss your dose. Call your care team if you are unable to keep an appointment. What may interact with this  medication? Capecitabine Fluorouracil Phenobarbital Phenytoin Primidone Trimethoprim;sulfamethoxazole This list may not describe all possible interactions. Give your health care provider a list of all the medicines, herbs, non-prescription drugs, or dietary supplements you use. Also tell them if you smoke, drink alcohol, or use illegal drugs. Some items may interact with your medicine. What should I watch for while using this medication? Your condition will be monitored carefully while you are receiving this medication. This medication may increase the side effects of 5-fluorouracil. Tell your care team if you have diarrhea or mouth sores that do not get better or that get worse. What side effects may I notice from receiving this medication? Side effects that you should report to your care team as soon as possible: Allergic reactions--skin rash, itching, hives, swelling of the face, lips, tongue, or throat This list may not describe all possible side effects. Call your doctor for medical advice about side effects. You may report side effects to FDA at 1-800-FDA-1088. Where should I keep my medication? This medication is given in a hospital or clinic. It will not be stored at home. NOTE: This sheet is a summary. It may not cover all possible information. If you have questions about this medicine, talk to your doctor, pharmacist, or health care provider.  2023 Elsevier/Gold Standard (2022-02-01 00:00:00)  Fluorouracil Injection What is this medication? FLUOROURACIL (flure oh YOOR a sil) treats some types of cancer. It works by slowing down the growth of cancer cells. This medicine may be used for other purposes; ask your health care provider or pharmacist if you have questions. COMMON BRAND NAME(S): Adrucil What should I tell my care team before I take this medication? They need to know if you have any of these conditions: Blood disorders Dihydropyrimidine dehydrogenase (DPD)  deficiency Infection, such as chickenpox, cold sores, herpes Kidney disease Liver disease Poor nutrition Recent or ongoing radiation therapy An unusual or allergic reaction to fluorouracil, other medications, foods, dyes, or preservatives If you or your partner are pregnant or trying to get pregnant Breast-feeding How should I use this medication? This medication is injected into a vein. It is administered by your care team in a hospital or clinic setting. Talk to your care team about the use of this medication in children. Special care may be needed. Overdosage: If you think you have taken too much of this medicine contact a poison control center or emergency room at once. NOTE: This medicine is only for you. Do not share this medicine with others. What if I miss a dose? Keep appointments for follow-up doses. It is important not to miss your dose. Call your care team if you are unable to keep an appointment. What may interact with this medication? Do not take this medication with any of the following: Live virus vaccines This medication may also interact with the following: Medications that treat or prevent blood clots, such as  warfarin, enoxaparin, dalteparin This list may not describe all possible interactions. Give your health care provider a list of all the medicines, herbs, non-prescription drugs, or dietary supplements you use. Also tell them if you smoke, drink alcohol, or use illegal drugs. Some items may interact with your medicine. What should I watch for while using this medication? Your condition will be monitored carefully while you are receiving this medication. This medication may make you feel generally unwell. This is not uncommon as chemotherapy can affect healthy cells as well as cancer cells. Report any side effects. Continue your course of treatment even though you feel ill unless your care team tells you to stop. In some cases, you may be given additional medications  to help with side effects. Follow all directions for their use. This medication may increase your risk of getting an infection. Call your care team for advice if you get a fever, chills, sore throat, or other symptoms of a cold or flu. Do not treat yourself. Try to avoid being around people who are sick. This medication may increase your risk to bruise or bleed. Call your care team if you notice any unusual bleeding. Be careful brushing or flossing your teeth or using a toothpick because you may get an infection or bleed more easily. If you have any dental work done, tell your dentist you are receiving this medication. Avoid taking medications that contain aspirin, acetaminophen, ibuprofen, naproxen, or ketoprofen unless instructed by your care team. These medications may hide a fever. Do not treat diarrhea with over the counter products. Contact your care team if you have diarrhea that lasts more than 2 days or if it is severe and watery. This medication can make you more sensitive to the sun. Keep out of the sun. If you cannot avoid being in the sun, wear protective clothing and sunscreen. Do not use sun lamps, tanning beds, or tanning booths. Talk to your care team if you or your partner wish to become pregnant or think you might be pregnant. This medication can cause serious birth defects if taken during pregnancy and for 3 months after the last dose. A reliable form of contraception is recommended while taking this medication and for 3 months after the last dose. Talk to your care team about effective forms of contraception. Do not father a child while taking this medication and for 3 months after the last dose. Use a condom while having sex during this time period. Do not breastfeed while taking this medication. This medication may cause infertility. Talk to your care team if you are concerned about your fertility. What side effects may I notice from receiving this medication? Side effects that you  should report to your care team as soon as possible: Allergic reactions--skin rash, itching, hives, swelling of the face, lips, tongue, or throat Heart attack--pain or tightness in the chest, shoulders, arms, or jaw, nausea, shortness of breath, cold or clammy skin, feeling faint or lightheaded Heart failure--shortness of breath, swelling of the ankles, feet, or hands, sudden weight gain, unusual weakness or fatigue Heart rhythm changes--fast or irregular heartbeat, dizziness, feeling faint or lightheaded, chest pain, trouble breathing High ammonia level--unusual weakness or fatigue, confusion, loss of appetite, nausea, vomiting, seizures Infection--fever, chills, cough, sore throat, wounds that don't heal, pain or trouble when passing urine, general feeling of discomfort or being unwell Low red blood cell level--unusual weakness or fatigue, dizziness, headache, trouble breathing Pain, tingling, or numbness in the hands or feet, muscle weakness, change  in vision, confusion or trouble speaking, loss of balance or coordination, trouble walking, seizures Redness, swelling, and blistering of the skin over hands and feet Severe or prolonged diarrhea Unusual bruising or bleeding Side effects that usually do not require medical attention (report to your care team if they continue or are bothersome): Dry skin Headache Increased tears Nausea Pain, redness, or swelling with sores inside the mouth or throat Sensitivity to light Vomiting This list may not describe all possible side effects. Call your doctor for medical advice about side effects. You may report side effects to FDA at 1-800-FDA-1088. Where should I keep my medication? This medication is given in a hospital or clinic. It will not be stored at home. NOTE: This sheet is a summary. It may not cover all possible information. If you have questions about this medicine, talk to your doctor, pharmacist, or health care provider.  2023 Elsevier/Gold  Standard (2021-12-28 00:00:00)

## 2022-08-09 NOTE — Addendum Note (Signed)
Addended by: Randa Evens C on: 08/09/2022 01:16 PM   Modules accepted: Level of Service

## 2022-08-09 NOTE — Progress Notes (Signed)
Pt and husband in for follow up. Denies any difficulties or concerns today.

## 2022-08-10 LAB — SAMPLE TO BLOOD BANK

## 2022-08-11 ENCOUNTER — Other Ambulatory Visit: Payer: Self-pay

## 2022-08-11 ENCOUNTER — Inpatient Hospital Stay: Payer: BC Managed Care – PPO

## 2022-08-11 VITALS — BP 103/57 | HR 69 | Temp 97.0°F | Resp 17

## 2022-08-11 DIAGNOSIS — Z5111 Encounter for antineoplastic chemotherapy: Secondary | ICD-10-CM | POA: Diagnosis not present

## 2022-08-11 DIAGNOSIS — C7A8 Other malignant neuroendocrine tumors: Secondary | ICD-10-CM

## 2022-08-11 MED ORDER — HEPARIN SOD (PORK) LOCK FLUSH 100 UNIT/ML IV SOLN
INTRAVENOUS | Status: AC
  Administered 2022-08-11: 500 [IU]
  Filled 2022-08-11: qty 5

## 2022-08-11 MED ORDER — PEGFILGRASTIM-CBQV 6 MG/0.6ML ~~LOC~~ SOSY
6.0000 mg | PREFILLED_SYRINGE | Freq: Once | SUBCUTANEOUS | Status: AC
  Administered 2022-08-11: 6 mg via SUBCUTANEOUS
  Filled 2022-08-11: qty 0.6

## 2022-08-11 MED ORDER — HEPARIN SOD (PORK) LOCK FLUSH 100 UNIT/ML IV SOLN
500.0000 [IU] | Freq: Once | INTRAVENOUS | Status: AC | PRN
  Filled 2022-08-11: qty 5

## 2022-08-11 MED ORDER — SODIUM CHLORIDE 0.9% FLUSH
10.0000 mL | INTRAVENOUS | Status: DC | PRN
  Administered 2022-08-11: 10 mL
  Filled 2022-08-11: qty 10

## 2022-08-20 ENCOUNTER — Other Ambulatory Visit: Payer: Self-pay | Admitting: Oncology

## 2022-08-20 DIAGNOSIS — C7A8 Other malignant neuroendocrine tumors: Secondary | ICD-10-CM

## 2022-08-21 ENCOUNTER — Other Ambulatory Visit: Payer: Self-pay | Admitting: *Deleted

## 2022-08-22 MED FILL — Dexamethasone Sodium Phosphate Inj 100 MG/10ML: INTRAMUSCULAR | Qty: 1 | Status: AC

## 2022-08-23 ENCOUNTER — Inpatient Hospital Stay: Payer: BC Managed Care – PPO | Attending: Oncology

## 2022-08-23 ENCOUNTER — Encounter: Payer: Self-pay | Admitting: Oncology

## 2022-08-23 ENCOUNTER — Inpatient Hospital Stay: Payer: BC Managed Care – PPO

## 2022-08-23 ENCOUNTER — Inpatient Hospital Stay (HOSPITAL_BASED_OUTPATIENT_CLINIC_OR_DEPARTMENT_OTHER): Payer: BC Managed Care – PPO | Admitting: Oncology

## 2022-08-23 VITALS — BP 109/57 | HR 78 | Temp 97.1°F | Wt 157.0 lb

## 2022-08-23 DIAGNOSIS — Z5111 Encounter for antineoplastic chemotherapy: Secondary | ICD-10-CM

## 2022-08-23 DIAGNOSIS — T451X5A Adverse effect of antineoplastic and immunosuppressive drugs, initial encounter: Secondary | ICD-10-CM | POA: Insufficient documentation

## 2022-08-23 DIAGNOSIS — G620 Drug-induced polyneuropathy: Secondary | ICD-10-CM | POA: Diagnosis not present

## 2022-08-23 DIAGNOSIS — C7A8 Other malignant neuroendocrine tumors: Secondary | ICD-10-CM | POA: Diagnosis not present

## 2022-08-23 DIAGNOSIS — Z5189 Encounter for other specified aftercare: Secondary | ICD-10-CM | POA: Insufficient documentation

## 2022-08-23 DIAGNOSIS — D6959 Other secondary thrombocytopenia: Secondary | ICD-10-CM

## 2022-08-23 DIAGNOSIS — C155 Malignant neoplasm of lower third of esophagus: Secondary | ICD-10-CM | POA: Diagnosis present

## 2022-08-23 DIAGNOSIS — E876 Hypokalemia: Secondary | ICD-10-CM | POA: Insufficient documentation

## 2022-08-23 LAB — CBC WITH DIFFERENTIAL/PLATELET
Abs Immature Granulocytes: 0.03 10*3/uL (ref 0.00–0.07)
Basophils Absolute: 0 10*3/uL (ref 0.0–0.1)
Basophils Relative: 1 %
Eosinophils Absolute: 0.3 10*3/uL (ref 0.0–0.5)
Eosinophils Relative: 8 %
HCT: 36.2 % (ref 36.0–46.0)
Hemoglobin: 12.2 g/dL (ref 12.0–15.0)
Immature Granulocytes: 1 %
Lymphocytes Relative: 36 %
Lymphs Abs: 1.3 10*3/uL (ref 0.7–4.0)
MCH: 35.9 pg — ABNORMAL HIGH (ref 26.0–34.0)
MCHC: 33.7 g/dL (ref 30.0–36.0)
MCV: 106.5 fL — ABNORMAL HIGH (ref 80.0–100.0)
Monocytes Absolute: 0.6 10*3/uL (ref 0.1–1.0)
Monocytes Relative: 15 %
Neutro Abs: 1.4 10*3/uL — ABNORMAL LOW (ref 1.7–7.7)
Neutrophils Relative %: 39 %
Platelets: 80 10*3/uL — ABNORMAL LOW (ref 150–400)
RBC: 3.4 MIL/uL — ABNORMAL LOW (ref 3.87–5.11)
RDW: 17.2 % — ABNORMAL HIGH (ref 11.5–15.5)
WBC: 3.7 10*3/uL — ABNORMAL LOW (ref 4.0–10.5)
nRBC: 0 % (ref 0.0–0.2)

## 2022-08-23 LAB — COMPREHENSIVE METABOLIC PANEL
ALT: 24 U/L (ref 0–44)
AST: 35 U/L (ref 15–41)
Albumin: 3.8 g/dL (ref 3.5–5.0)
Alkaline Phosphatase: 98 U/L (ref 38–126)
Anion gap: 8 (ref 5–15)
BUN: 11 mg/dL (ref 8–23)
CO2: 25 mmol/L (ref 22–32)
Calcium: 8.8 mg/dL — ABNORMAL LOW (ref 8.9–10.3)
Chloride: 107 mmol/L (ref 98–111)
Creatinine, Ser: 0.65 mg/dL (ref 0.44–1.00)
GFR, Estimated: >60 mL/min (ref >60)
Glucose, Bld: 104 mg/dL — ABNORMAL HIGH (ref 70–99)
Potassium: 3.8 mmol/L (ref 3.5–5.1)
Sodium: 140 mmol/L (ref 135–145)
Total Bilirubin: 0.7 mg/dL (ref 0.3–1.2)
Total Protein: 6.9 g/dL (ref 6.5–8.1)

## 2022-08-23 MED ORDER — FLUOROURACIL CHEMO INJECTION 2.5 GM/50ML
400.0000 mg/m2 | Freq: Once | INTRAVENOUS | Status: AC
  Administered 2022-08-23: 650 mg via INTRAVENOUS
  Filled 2022-08-23: qty 13

## 2022-08-23 MED ORDER — SODIUM CHLORIDE 0.9 % IV SOLN
10.0000 mg | Freq: Once | INTRAVENOUS | Status: AC
  Administered 2022-08-23: 10 mg via INTRAVENOUS
  Filled 2022-08-23: qty 10

## 2022-08-23 MED ORDER — SODIUM CHLORIDE 0.9 % IV SOLN
2400.0000 mg/m2 | INTRAVENOUS | Status: DC
  Administered 2022-08-23: 4000 mg via INTRAVENOUS
  Filled 2022-08-23: qty 80

## 2022-08-23 MED ORDER — DEXTROSE 5 % IV SOLN
Freq: Once | INTRAVENOUS | Status: AC
  Filled 2022-08-23: qty 250

## 2022-08-23 MED ORDER — PALONOSETRON HCL INJECTION 0.25 MG/5ML: 0.2500 mg | Freq: Once | INTRAVENOUS | Status: DC

## 2022-08-23 MED ORDER — LEUCOVORIN CALCIUM INJECTION 350 MG
400.0000 mg/m2 | Freq: Once | INTRAVENOUS | Status: AC
  Administered 2022-08-23: 664 mg via INTRAVENOUS
  Filled 2022-08-23: qty 33.2

## 2022-08-23 NOTE — Patient Instructions (Signed)
[facility] CANCER CTR AT [place]  Discharge Instructions: Thank you for choosing [facility] to provide your oncology and hematology care.  If you have a lab appointment with the [facility], please go directly to the [facility] and check in at the registration area.  Wear comfortable clothing and clothing appropriate for easy access to any Portacath or PICC line.   We strive to give you quality time with your provider. You may need to reschedule your appointment if you arrive late (15 or more minutes).  Arriving late affects you and other patients whose appointments are after yours.  Also, if you miss three or more appointments without notifying the office, you may be dismissed from the clinic at the provider's discretion.      For prescription refill requests, have your pharmacy contact our office and allow 72 hours for refills to be completed.    Today you received the following chemotherapy and/or immunotherapy agents- leucovorin, 5FU      To help prevent nausea and vomiting after your treatment, we encourage you to take your nausea medication as directed.  BELOW ARE SYMPTOMS THAT SHOULD BE REPORTED IMMEDIATELY: *FEVER GREATER THAN 100.4 F (38 C) OR HIGHER *CHILLS OR SWEATING *NAUSEA AND VOMITING THAT IS NOT CONTROLLED WITH YOUR NAUSEA MEDICATION *UNUSUAL SHORTNESS OF BREATH *UNUSUAL BRUISING OR BLEEDING *URINARY PROBLEMS (pain or burning when urinating, or frequent urination) *BOWEL PROBLEMS (unusual diarrhea, constipation, pain near the anus) TENDERNESS IN MOUTH AND THROAT WITH OR WITHOUT PRESENCE OF ULCERS (sore throat, sores in mouth, or a toothache) UNUSUAL RASH, SWELLING OR PAIN  UNUSUAL VAGINAL DISCHARGE OR ITCHING   Items with * indicate a potential emergency and should be followed up as soon as possible or go to the Emergency Department if any problems should occur.  Please show the CHEMOTHERAPY ALERT CARD or IMMUNOTHERAPY ALERT CARD at  check-in to the Emergency Department and triage nurse.  Should you have questions after your visit or need to cancel or reschedule your appointment, please contact [facility] CANCER [place] AT [place]  [phone] and follow the prompts.  Office hours are 8:00 a.m. to 4:30 p.m. Monday - Friday. Please note that voicemails left after 4:00 p.m. may not be returned until the following business day.  We are closed weekends and major holidays. You have access to a nurse at all times for urgent questions. Please call the main number to the clinic [phone] and follow the prompts.  For any non-urgent questions, you may also contact your provider using MyChart. We now offer e-Visits for anyone 56 and older to request care online for non-urgent symptoms. For details visit [email or web address].   Also download the MyChart app! Go to the app store, search "MyChart", open the app, select [place], and log in with your MyChart username and password.  Masks are optional in the cancer centers. If you would like for your care team to wear a mask while they are taking care of you, please let them know. For doctor visits, patients may have with them one support person who is at least 61 years old. At this time, visitors are not allowed in the infusion area.

## 2022-08-23 NOTE — Progress Notes (Signed)
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 Haig Prophet on 03/01/2022 EGD showed a medium-sized ulcerating mass with no bleeding or stigmata of recent bleeding at the GE junction..  Mass was partially obstructing.  There is also mention of another ulcerated noncircumferential mass with oozing bleeding found at the GE junction with extension into the cardia.  Both these masses were biopsied.  Pathology was pending at the time of my visit with the patient and was reported to be a poorly differentiated carcinoma.  Patient is currently able to swallow soft foods without much difficulty.  However more solid foods occasionally feel stuck.    PET was denied by insurance.  CT chest abdomen and pelvis with contrast showed asymmetric distal esophageal wall thickening with thickening extending around the lesser curvature of the stomach through the gastric cardia and proximal fundus.  Paratracheal/paraesophageal, gastrohepatic and PET retroperitoneal adenopathy.  Left periaortic lymph node measuring 17 mm at the level of left renal hilum.   Final pathology showed there were 2 specimens collected.  Specimen a was poorly differentiated carcinoma with ulceration and necrosis.v verys weakly positive for synaptophysin 50% of the cells.  Cells negative for CK7 and CK23 for the BF1 CDX2 100 Melan-A HMB45  and CD20.  Specimen B showed large cell neuroendocrine carcinoma with neoplastic cells positive for CD56 with diffuse strong staining and majority of the neoplastic cells were also positive for synaptophysin with weak staining Ki-67 95%   Patient went to [facility] for second opinion.  Pathology was also looked at but was signed out as poorly differentiated adenocarcinoma at [facility].  Patient did not have a good response to cisplatin etoposide Tecentriq regimen.  She was therefore switched to FOLFOX chemotherapy.  CPS score was 1 and therefore Keytruda was excluded.  HER2 negative.  Interval history-tolerating treatments well so far.  She does report that her neuropathy especially in her hands has been lasting longer and can be uncomfortable.  ECOG PS- 1 Pain scale- 0   Review of systems- Review of Systems  Constitutional:  Positive for malaise/fatigue. Negative for chills, fever and weight loss.  HENT:  Negative for congestion, ear discharge and nosebleeds.   Eyes:  Negative for blurred vision.  Respiratory:  Negative for cough, hemoptysis, sputum production, shortness of breath and wheezing.   Cardiovascular:  Negative for chest pain, palpitations, orthopnea and claudication.  Gastrointestinal:  Negative for abdominal pain, blood in stool, constipation, diarrhea, heartburn, melena, nausea and vomiting.  Genitourinary:  Negative for dysuria, flank pain, frequency, hematuria and urgency.  Musculoskeletal:  Negative for back pain, joint pain and myalgias.  Skin:  Negative for rash.  Neurological:  Positive for sensory change (Peripheral neuropathy). Negative for dizziness, tingling, focal weakness, seizures, weakness and headaches.  Endo/Heme/Allergies:  Does not bruise/bleed easily.  Psychiatric/Behavioral:  Negative for depression and suicidal ideas. The patient does not have insomnia.       Allergies  Allergen  Reactions   Sulfa Antibiotics Hives    Says her tongue swelled a bit   Sulfonamide  Derivatives Swelling    Tongue swelling     Past Medical History:  Diagnosis Date   Anemia    Arthritis    Atrial fibrillation ([place])    B12 deficiency    Dysrhythmia    Hyperlipidemia    Hypothyroidism    Menopause    Palpitations    Hx of, 25 years ago   Vertigo      Past Surgical History:  Procedure Laterality Date   BILATERAL SALPINGOOPHORECTOMY  2008   DILATION AND CURETTAGE OF UTERUS  08/24/2006   ESOPHAGOGASTRODUODENOSCOPY (EGD) WITH PROPOFOL N/A 03/01/2022   Procedure: ESOPHAGOGASTRODUODENOSCOPY (EGD) WITH PROPOFOL;  Surgeon: Lesly Rubenstein, MD;  Location: [facility] ENDOSCOPY;  Service: Endoscopy;  Laterality: N/A;   ESOPHAGOGASTRODUODENOSCOPY (EGD) WITH PROPOFOL N/A 05/04/2022   Procedure: ESOPHAGOGASTRODUODENOSCOPY (EGD) WITH PROPOFOL;  Surgeon: [name], MD;  Location: [facility] ENDOSCOPY;  Service: Gastroenterology;  Laterality: N/A;   incision tendon sheath for trigger finger Right    ring finger   LAPAROSCOPIC SUPRACERVICAL HYSTERECTOMY  2008   Dr. Ammie Dalton; with BSO   NOSE SURGERY  03/2008   PORTA CATH INSERTION N/A 03/21/2022   Procedure: PORTA CATH INSERTION;  Surgeon: Algernon Huxley, MD;  Location: [place] [facility];  Service: Cardiovascular;  Laterality: N/A;    Social History   Socioeconomic History   Marital status: Married    Spouse name: Not on file   Number of children: Not on file   Years of education: Not on file   Highest education level: Not on file  Occupational History   Occupation: Full time  Tobacco Use   Smoking status: Never   Smokeless tobacco: Never  Vaping Use   Vaping Use: Never used  Substance and Sexual Activity   Alcohol use: Never   Drug use: Never   Sexual activity: Not Currently    Birth control/protection: Surgical    Comment: Hysterectomy  Other Topics Concern   Not on file  Social History Narrative   Married with 2 children   Gets regular exercise   Social Determinants of Health   Financial Resource Strain:  Low Risk  (04/20/2022)   Overall Financial Resource Strain (CARDIA)    Difficulty of Paying Living Expenses: Not very hard  Food Insecurity: No Food Insecurity (04/20/2022)   Hunger Vital Sign    Worried About Running Out of Food in the Last Year: Never true    Ran Out of Food in the Last Year: Never true  Transportation Needs: No Transportation Needs (04/20/2022)   PRAPARE - Hydrologist (Medical): No    Lack of Transportation (Non-Medical): No  Physical Activity: Inactive (04/20/2022)   Exercise Vital Sign    Days of Exercise per Week: 0 days    Minutes of Exercise per Session: 0 min  Stress: No Stress Concern Present (04/20/2022)   [place]    Feeling of Stress : Only a little  Social Connections: Socially Integrated (04/20/2022)   Social Connection and Isolation Panel [NHANES]    Frequency of Communication with Friends and Family: More than three times a week    Frequency of Social Gatherings with Friends and Family: More than three times a week    Attends Religious Services: More than 4 times per year    Active Member of Clubs or Organizations: Yes  Attends Music therapist: More than 4 times per year    Marital Status: Married  Human resources officer Violence: Not on file    Family History  Problem Relation Age of Onset   Hyperlipidemia Mother    Hypertension Mother    Stroke Mother    Heart disease Father    Hyperlipidemia Father    Atrial fibrillation Father    Melanoma Father    Arrhythmia Sister        Atrial fibrillation   Heart disease Sister    Atrial fibrillation Sister    Heart attack Brother    Breast cancer Neg Hx      Current Outpatient Medications:    Calcium Carbonate-Vitamin D 600-200 MG-UNIT TABS, Take 1 tablet by mouth daily.  , Disp: , Rfl:    cetirizine (ZYRTEC) 10 MG tablet, Take 10 mg by mouth daily., Disp: , Rfl:    Cholecalciferol 25 MCG (1000 UT)  tablet, Take 1,000 Units by mouth daily., Disp: , Rfl:    diphenhydrAMINE (BENADRYL) 25 mg capsule, Take 25 mg by mouth every 6 (six) hours as needed., Disp: , Rfl:    flecainide (TAMBOCOR) 50 MG tablet, Take 1 tablet by mouth 2 (two) times daily., Disp: , Rfl:    LORazepam (ATIVAN) 0.5 MG tablet, TAKE 1 TABLET (0.5 MG TOTAL) BY MOUTH EVERY 6 (SIX) HOURS AS NEEDED (NAUSEA OR VOMITING)., Disp: 30 tablet, Rfl: 0   midodrine (PROAMATINE) 10 MG tablet, Take by mouth., Disp: , Rfl:    mometasone (NASONEX) 50 MCG/ACT nasal spray, Place 2 sprays into the nose daily., Disp: , Rfl:    nystatin cream (MYCOSTATIN), Apply topically 2 (two) times daily., Disp: , Rfl:    OLANZapine (ZYPREXA) 10 MG tablet, TAKE 1 TABLET BY MOUTH EVERYDAY AT BEDTIME, Disp: 90 tablet, Rfl: 1   ondansetron (ZOFRAN) 8 MG tablet, TAKE 1 TABLET BY MOUTH 2 TIMES DAILY AS NEEDED. START ON THE THIRD DAY AFTER CISPLATIN CHEMOTHERAPY., Disp: 18 tablet, Rfl: 3   ondansetron (ZOFRAN-ODT) 4 MG disintegrating tablet, Take 1 tablet (4 mg total) by mouth every 8 (eight) hours as needed for nausea or vomiting., Disp: 20 tablet, Rfl: 3   pantoprazole (PROTONIX) 40 MG tablet, Take 40 mg by mouth daily., Disp: , Rfl:    Saccharomyces boulardii (PROBIOTIC) 250 MG CAPS, Take 1 capsule by mouth daily., Disp: , Rfl:    simvastatin (ZOCOR) 20 MG tablet, TAKE 1 TABLET BY MOUTH EVERY EVENING., Disp: 30 tablet, Rfl: 0   traZODone (DESYREL) 50 MG tablet, Take 50 mg by mouth at bedtime as needed., Disp: , Rfl:    Zinc Citrate-Phytase (ZYTAZE) 25-500 MG CAPS, Take 1 Dose by mouth daily., Disp: , Rfl:  No current facility-administered medications for this visit.  Facility-Administered Medications Ordered in Other Visits:    dexamethasone (DECADRON) 10 mg in sodium chloride 0.9 % 50 mL IVPB, 10 mg, Intravenous, Once, Sindy Guadeloupe, MD, Last Rate: 204 mL/hr at 08/23/22 1003, 10 mg at 08/23/22 1003   fluorouracil (ADRUCIL) 4,000 mg in sodium chloride 0.9 % 70 mL  chemo infusion, 2,400 mg/m2 (Treatment Plan Recorded), Intravenous, 1 day or 1 dose, Sindy Guadeloupe, MD   fluorouracil (ADRUCIL) chemo injection 650 mg, 400 mg/m2 (Treatment Plan Recorded), Intravenous, Once, Sindy Guadeloupe, MD   heparin lock flush 100 UNIT/ML injection, , , ,    leucovorin 664 mg in dextrose 5 % 250 mL infusion, 400 mg/m2 (Treatment Plan Recorded), Intravenous, Once, Sindy Guadeloupe, MD  Physical exam:  Vitals:   08/23/22 0844  BP: (!) 109/57  Pulse: 78  Temp: (!) 97.1 F (36.2 C)  TempSrc: Tympanic  SpO2: 98%  Weight: 157 lb (71.2 kg)   Physical Exam Constitutional:      General: She is not in acute distress. Cardiovascular:     Rate and Rhythm: Normal rate and regular rhythm.     Heart sounds: Normal heart sounds.  Pulmonary:     Effort: Pulmonary effort is normal.     Breath sounds: Normal breath sounds.  Skin:    General: Skin is warm and dry.  Neurological:     Mental Status: She is alert and oriented to person, place, and time.         Latest Ref Rng & Units 08/23/2022    8:28 AM  CMP  Glucose 70 - 99 mg/dL 104   BUN 8 - 23 mg/dL 11   Creatinine 0.44 - 1.00 mg/dL 0.65   Sodium 135 - 145 mmol/L 140   Potassium 3.5 - 5.1 mmol/L 3.8   Chloride 98 - 111 mmol/L 107   CO2 22 - 32 mmol/L 25   Calcium 8.9 - 10.3 mg/dL 8.8   Total Protein 6.5 - 8.1 g/dL 6.9   Total Bilirubin 0.3 - 1.2 mg/dL 0.7   Alkaline Phos 38 - 126 U/L 98   AST 15 - 41 U/L 35   ALT 0 - 44 U/L 24       Latest Ref Rng & Units 08/23/2022    8:28 AM  CBC  WBC 4.0 - 10.5 K/uL 3.7   Hemoglobin 12.0 - 15.0 g/dL 12.2   Hematocrit 36.0 - 46.0 % 36.2   Platelets 150 - 400 K/uL 80     No images are attached to the encounter.  CT CHEST ABDOMEN PELVIS W CONTRAST  Result Date: 08/01/2022 CLINICAL DATA:  Esophageal cancer, follow-up.  * Tracking Code: BO * EXAM: CT CHEST, ABDOMEN, AND PELVIS WITH CONTRAST TECHNIQUE: Multidetector CT imaging of the chest, abdomen and pelvis was  performed following the standard protocol during bolus administration of intravenous contrast. RADIATION DOSE REDUCTION: This exam was performed according to the departmental dose-optimization program which includes automated exposure control, adjustment of the mA and/or kV according to patient size and/or use of iterative reconstruction technique. CONTRAST:  123m OMNIPAQUE IOHEXOL 300 MG/ML  SOLN COMPARISON:  Multiple priors including CT May 03, 2022 PET-CT March 22, 2022 and CT chest abdomen pelvis March 17, 2022 FINDINGS: CT CHEST FINDINGS Cardiovascular: Right chest Port-A-Cath with tip in the right atrium. Aortic atherosclerosis. No central pulmonary embolus on this nondedicated study. Normal size heart. No significant pericardial effusion/thickening. Mediastinum/Nodes: Decreased asymmetric distal esophageal wall thickening which extends into the lesser curvature of the stomach for instance on image 46/2. Decreased size of the mediastinal lymph nodes.  For reference: -left upper paratracheal lymph node near the thoracic inlet now measures 5 mm in short axis on image 9/2 previously 11 mm. -left lower paraesophageal lymph node near the hiatus now measures 4 mm in short axis on image 48/2 previously 11 mm. No pathologically enlarged axillary or hilar lymph nodes. Lungs/Pleura: Biapical pleuroparenchymal scarring. No suspicious pulmonary nodules or masses. Scattered areas of subsegmental atelectasis/scarring. No pleural effusion. No pneumothorax. Musculoskeletal: No aggressive lytic or blastic lesion of bone. CT ABDOMEN PELVIS FINDINGS Hepatobiliary: Probable hepatic steatosis. No suspicious hepatic lesion. Gallbladder is unremarkable. No biliary ductal dilation. Pancreas: No pancreatic ductal dilation or evidence of acute inflammation. Spleen:  No splenomegaly. Adrenals/Urinary Tract: Bilateral adrenal glands appear normal. No hydronephrosis. Kidneys demonstrate symmetric enhancement. Urinary bladder is  unremarkable for degree of distension. Stomach/Bowel: Radiopaque enteric contrast material traverses the splenic flexure. No pathologic dilation of small or large bowel. The appendix and terminal ileum are within normal limits. Moderate volume of formed stool in the colon. No evidence of acute bowel inflammation. Vascular/Lymphatic: Aortic atherosclerosis. The portal, splenic and superior mesenteric veins are patent. Decreased size of the gastrohepatic and retroperitoneal adenopathy. For reference: -gastrohepatic ligament lymph node measures 6 mm in short axis on image 53/2 previously 13 mm. -left periaortic lymph node at the level of the left renal hilum measures 7 mm in short axis on image 66/2 previously 17 mm. Reproductive: Status post hysterectomy. No adnexal masses. Other: No significant abdominopelvic free fluid. No discrete peritoneal or omental nodularity. Musculoskeletal: No aggressive lytic or blastic lesion of bone. IMPRESSION: 1. Decreased asymmetric distal proximal gastric wall thickening, consistent with treatment response. 2. Decreased thoracoabdominal adenopathy, consistent with treatment response. 3. No evidence of new or progressive disease in the chest, abdomen or pelvis. 4. Probable hepatic steatosis. 5. Moderate volume of formed stool in the colon. Correlate for constipation. 6.  Aortic Atherosclerosis (ICD10-I70.0). Electronically Signed   By: Dahlia Bailiff M.D.   On: 08/01/2022 10:08     Assessment and plan- Patient is a 61 y.o. 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  She is here for on treatment assessment prior to cycle 8 of palliative FOLFOX chemotherapy  Patient has been developing progressive neuropathy on oxaliplatin.  She also has thrombocytopenia likely secondary to oxaliplatin.  And therefore dropping oxaliplatin altogether at this time and she will continue to receive 5-FU bolus and infusional chemotherapy alone every 2 weeks until  progression or toxicity.  She will proceed with cycle 8 today and I will see her back in 2 weeks for cycle 9.  She will come for pump disconnect on day 3 and receive udenyca on that day.  Patient is scheduled for PET scan on 09/15/2021 at [facility] followed by visit with Dr. Elenor Quinones for consideration for surgery.   Visit Diagnosis 1. 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 Chemotherapy-induced thrombocytopenia   3. Chemotherapy-induced peripheral neuropathy ([place])   4. Encounter for antineoplastic chemotherapy      Dr. Randa Evens, MD, MPH [facility] [phone] 08/23/2022 10:13 AM

## 2022-08-23 NOTE — Progress Notes (Signed)
Nutrition Follow-up:   Patient with large cell neuroendocrine tumor vs adenocarcinoma of GE junction likely stage IV.  Patient on folfox, minus oxaliplatin.    Met with patient and husband during infusion.  Patient reports that her appetite is good.  She still is working on getting in enough protein.  Has started drinking protein shakes again.  Having issues with dry mouth.    Medications: reviewed  Labs: glucose 104  Anthropometrics:   Weight 157 lb 159 lb 11.2 oz on 11/14 158 lb on 10/17 157 lb 10/3 153 lb on 8/29 149 lb on 8/14 153 lb on 7/28   NUTRITION DIAGNOSIS: Inadequate oral intake improved    INTERVENTION:  Continue to encourage good sources of protein and overall nutrition.    MONITORING, EVALUATION, GOAL: weight trends, intake   NEXT VISIT: ~4 weeks during infusion  Ariana Droege B. Ariana Wilson, [place], [place] Registered Dietitian [phone]

## 2022-08-25 ENCOUNTER — Other Ambulatory Visit: Payer: Self-pay

## 2022-08-25 ENCOUNTER — Inpatient Hospital Stay: Payer: BC Managed Care – PPO

## 2022-08-25 VITALS — BP 91/54 | HR 70 | Resp 18

## 2022-08-25 DIAGNOSIS — Z5111 Encounter for antineoplastic chemotherapy: Secondary | ICD-10-CM | POA: Diagnosis not present

## 2022-08-25 DIAGNOSIS — C7A8 Other malignant neuroendocrine tumors: Secondary | ICD-10-CM

## 2022-08-25 MED ORDER — HEPARIN SOD (PORK) LOCK FLUSH 100 UNIT/ML IV SOLN
500.0000 [IU] | Freq: Once | INTRAVENOUS | Status: AC | PRN
  Administered 2022-08-25: 500 [IU]
  Filled 2022-08-25: qty 5

## 2022-08-25 MED ORDER — PEGFILGRASTIM-CBQV 6 MG/0.6ML ~~LOC~~ SOSY
6.0000 mg | PREFILLED_SYRINGE | Freq: Once | SUBCUTANEOUS | Status: AC
  Administered 2022-08-25: 6 mg via SUBCUTANEOUS
  Filled 2022-08-25: qty 0.6

## 2022-08-25 MED ORDER — SODIUM CHLORIDE 0.9% FLUSH
10.0000 mL | INTRAVENOUS | Status: DC | PRN
  Administered 2022-08-25: 10 mL
  Filled 2022-08-25: qty 10

## 2022-09-02 MED FILL — Dexamethasone Sodium Phosphate Inj 100 MG/10ML: INTRAMUSCULAR | Qty: 1 | Status: AC

## 2022-09-06 ENCOUNTER — Encounter: Payer: Self-pay | Admitting: Oncology

## 2022-09-06 ENCOUNTER — Inpatient Hospital Stay: Payer: BC Managed Care – PPO

## 2022-09-06 ENCOUNTER — Inpatient Hospital Stay (HOSPITAL_BASED_OUTPATIENT_CLINIC_OR_DEPARTMENT_OTHER): Payer: BC Managed Care – PPO | Admitting: Oncology

## 2022-09-06 ENCOUNTER — Ambulatory Visit: Payer: BC Managed Care – PPO

## 2022-09-06 VITALS — BP 120/67 | HR 72 | Temp 98.6°F | Resp 20 | Wt 158.8 lb

## 2022-09-06 DIAGNOSIS — D6959 Other secondary thrombocytopenia: Secondary | ICD-10-CM | POA: Diagnosis not present

## 2022-09-06 DIAGNOSIS — C7A8 Other malignant neuroendocrine tumors: Secondary | ICD-10-CM | POA: Diagnosis not present

## 2022-09-06 DIAGNOSIS — T451X5A Adverse effect of antineoplastic and immunosuppressive drugs, initial encounter: Secondary | ICD-10-CM

## 2022-09-06 DIAGNOSIS — E876 Hypokalemia: Secondary | ICD-10-CM

## 2022-09-06 DIAGNOSIS — G620 Drug-induced polyneuropathy: Secondary | ICD-10-CM

## 2022-09-06 DIAGNOSIS — Z5111 Encounter for antineoplastic chemotherapy: Secondary | ICD-10-CM

## 2022-09-06 LAB — COMPREHENSIVE METABOLIC PANEL
ALT: 30 U/L (ref 0–44)
AST: 37 U/L (ref 15–41)
Albumin: 4 g/dL (ref 3.5–5.0)
Alkaline Phosphatase: 101 U/L (ref 38–126)
Anion gap: 5 (ref 5–15)
BUN: 11 mg/dL (ref 8–23)
CO2: 26 mmol/L (ref 22–32)
Calcium: 9.1 mg/dL (ref 8.9–10.3)
Chloride: 108 mmol/L (ref 98–111)
Creatinine, Ser: 0.74 mg/dL (ref 0.44–1.00)
GFR, Estimated: >60 mL/min (ref >60)
Glucose, Bld: 99 mg/dL (ref 70–99)
Potassium: 3.4 mmol/L — ABNORMAL LOW (ref 3.5–5.1)
Sodium: 139 mmol/L (ref 135–145)
Total Bilirubin: 1 mg/dL (ref 0.3–1.2)
Total Protein: 7 g/dL (ref 6.5–8.1)

## 2022-09-06 LAB — CBC WITH DIFFERENTIAL/PLATELET
Abs Immature Granulocytes: 0.01 10*3/uL (ref 0.00–0.07)
Basophils Absolute: 0 10*3/uL (ref 0.0–0.1)
Basophils Relative: 1 %
Eosinophils Absolute: 0.2 10*3/uL (ref 0.0–0.5)
Eosinophils Relative: 6 %
HCT: 35.4 % — ABNORMAL LOW (ref 36.0–46.0)
Hemoglobin: 12.3 g/dL (ref 12.0–15.0)
Immature Granulocytes: 0 %
Lymphocytes Relative: 35 %
Lymphs Abs: 1.3 10*3/uL (ref 0.7–4.0)
MCH: 36.8 pg — ABNORMAL HIGH (ref 26.0–34.0)
MCHC: 34.7 g/dL (ref 30.0–36.0)
MCV: 106 fL — ABNORMAL HIGH (ref 80.0–100.0)
Monocytes Absolute: 0.5 10*3/uL (ref 0.1–1.0)
Monocytes Relative: 13 %
Neutro Abs: 1.7 10*3/uL (ref 1.7–7.7)
Neutrophils Relative %: 45 %
Platelets: 96 10*3/uL — ABNORMAL LOW (ref 150–400)
RBC: 3.34 MIL/uL — ABNORMAL LOW (ref 3.87–5.11)
RDW: 16.2 % — ABNORMAL HIGH (ref 11.5–15.5)
WBC: 3.8 10*3/uL — ABNORMAL LOW (ref 4.0–10.5)
nRBC: 0 % (ref 0.0–0.2)

## 2022-09-06 MED ORDER — PALONOSETRON HCL INJECTION 0.25 MG/5ML
0.2500 mg | Freq: Once | INTRAVENOUS | Status: AC
  Administered 2022-09-06: 0.25 mg via INTRAVENOUS
  Filled 2022-09-06: qty 5

## 2022-09-06 MED ORDER — LEUCOVORIN CALCIUM INJECTION 350 MG
400.0000 mg/m2 | Freq: Once | INTRAVENOUS | Status: DC
  Filled 2022-09-06: qty 33.2

## 2022-09-06 MED ORDER — SODIUM CHLORIDE 0.9 % IV SOLN
INTRAVENOUS | Status: DC
  Filled 2022-09-06: qty 250

## 2022-09-06 MED ORDER — SODIUM CHLORIDE 0.9 % IV SOLN
2400.0000 mg/m2 | INTRAVENOUS | Status: DC
  Administered 2022-09-06: 4000 mg via INTRAVENOUS
  Filled 2022-09-06: qty 80

## 2022-09-06 MED ORDER — SODIUM CHLORIDE 0.9 % IV SOLN
10.0000 mg | Freq: Once | INTRAVENOUS | Status: AC
  Administered 2022-09-06: 10 mg via INTRAVENOUS
  Filled 2022-09-06: qty 10

## 2022-09-06 MED ORDER — FLUOROURACIL CHEMO INJECTION 2.5 GM/50ML
400.0000 mg/m2 | Freq: Once | INTRAVENOUS | Status: AC
  Administered 2022-09-06: 650 mg via INTRAVENOUS
  Filled 2022-09-06: qty 13

## 2022-09-06 MED ORDER — LEUCOVORIN CALCIUM INJECTION 350 MG
400.0000 mg/m2 | Freq: Once | INTRAVENOUS | Status: AC
  Administered 2022-09-06: 664 mg via INTRAVENOUS
  Filled 2022-09-06: qty 33.2

## 2022-09-06 NOTE — Progress Notes (Signed)
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 Haig Prophet on 03/01/2022 EGD showed a medium-sized ulcerating mass with no bleeding or stigmata of recent bleeding at the GE junction..  Mass was partially obstructing.  There is also mention of another ulcerated noncircumferential mass with oozing bleeding found at the GE junction with extension into the cardia.  Both these masses were biopsied.  Pathology was pending at the time of my visit with the patient and was reported to be a poorly differentiated carcinoma.  Patient is currently able to swallow soft foods without much difficulty.  However more solid foods occasionally feel stuck.    PET was denied by insurance.  CT chest abdomen and pelvis with contrast showed asymmetric distal esophageal wall thickening with thickening extending around the lesser curvature of the stomach through the gastric cardia and proximal fundus.  Paratracheal/paraesophageal, gastrohepatic and PET retroperitoneal adenopathy.  Left periaortic lymph node measuring 17 mm at the level of left renal hilum.   Final pathology showed there were 2 specimens collected.  Specimen a was poorly differentiated carcinoma with ulceration and necrosis.v verys weakly positive for synaptophysin 50% of the cells.  Cells negative for CK7 and CK23 for the BF1 CDX2 100 Melan-A HMB45  and CD20.  Specimen B showed large cell neuroendocrine carcinoma with neoplastic cells positive for CD56 with diffuse strong staining and majority of the neoplastic cells were also positive for synaptophysin with weak staining Ki-67 95%   Patient went to [facility] for second opinion.  Pathology was also looked at but was signed out as poorly differentiated adenocarcinoma at [facility].  Patient did not have a good response to cisplatin etoposide Tecentriq regimen.  She was therefore switched to FOLFOX chemotherapy.  CPS score was 1 and therefore Keytruda was excluded.  HER2 negative.    Interval history-tolerating treatments well so far.  Appetite and weight have remained stable.  Denies any significant nausea or vomiting.  ECOG PS- 1 Pain scale- 0   Review of systems- Review of Systems  Constitutional:  Negative for chills, fever, malaise/fatigue and weight loss.  HENT:  Negative for congestion, ear discharge and nosebleeds.   Eyes:  Negative for blurred vision.  Respiratory:  Negative for cough, hemoptysis, sputum production, shortness of breath and wheezing.   Cardiovascular:  Negative for chest pain, palpitations, orthopnea and claudication.  Gastrointestinal:  Negative for abdominal pain, blood in stool, constipation, diarrhea, heartburn, melena, nausea and vomiting.  Genitourinary:  Negative for dysuria, flank pain, frequency, hematuria and urgency.  Musculoskeletal:  Negative for back pain, joint pain and myalgias.  Skin:  Negative for rash.  Neurological:  Positive for sensory change (Peripheral neuropathy). Negative for dizziness, tingling, focal weakness, seizures, weakness and headaches.  Endo/Heme/Allergies:  Does not bruise/bleed easily.  Psychiatric/Behavioral:  Negative for depression and suicidal ideas. The patient does not have insomnia.       Allergies  Allergen Reactions   Sulfa  Antibiotics Hives    Says her tongue swelled a bit   Sulfonamide Derivatives Swelling    Tongue  swelling     Past Medical History:  Diagnosis Date   Anemia    Arthritis    Atrial fibrillation ([place])    B12 deficiency    Dysrhythmia    Hyperlipidemia    Hypothyroidism    Menopause    Palpitations    Hx of, 25 years ago   Vertigo      Past Surgical History:  Procedure Laterality Date   BILATERAL SALPINGOOPHORECTOMY  2008   DILATION AND CURETTAGE OF UTERUS  08/24/2006   ESOPHAGOGASTRODUODENOSCOPY (EGD) WITH PROPOFOL N/A 03/01/2022   Procedure: ESOPHAGOGASTRODUODENOSCOPY (EGD) WITH PROPOFOL;  Surgeon: Lesly Rubenstein, MD;  Location: [facility] ENDOSCOPY;  Service: Endoscopy;  Laterality: N/A;   ESOPHAGOGASTRODUODENOSCOPY (EGD) WITH PROPOFOL N/A 05/04/2022   Procedure: ESOPHAGOGASTRODUODENOSCOPY (EGD) WITH PROPOFOL;  Surgeon: [name], MD;  Location: [facility] ENDOSCOPY;  Service: Gastroenterology;  Laterality: N/A;   incision tendon sheath for trigger finger Right    ring finger   LAPAROSCOPIC SUPRACERVICAL HYSTERECTOMY  2008   Dr. Ammie Dalton; with BSO   NOSE SURGERY  03/2008   PORTA CATH INSERTION N/A 03/21/2022   Procedure: PORTA CATH INSERTION;  Surgeon: Algernon Huxley, MD;  Location: [place] [facility];  Service: Cardiovascular;  Laterality: N/A;    Social History   Socioeconomic History   Marital status: Married    Spouse name: Not on file   Number of children: Not on file   Years of education: Not on file   Highest education level: Not on file  Occupational History   Occupation: Full time  Tobacco Use   Smoking status: Never   Smokeless tobacco: Never  Vaping Use   Vaping Use: Never used  Substance and Sexual Activity   Alcohol use: Never   Drug use: Never   Sexual activity: Not Currently    Birth control/protection: Surgical    Comment: Hysterectomy  Other Topics Concern   Not on file  Social History Narrative   Married with 2 children   Gets regular exercise   Social Determinants of Health   Financial Resource Strain: Low Risk  (04/20/2022)   Overall  Financial Resource Strain (CARDIA)    Difficulty of Paying Living Expenses: Not very hard  Food Insecurity: No Food Insecurity (04/20/2022)   Hunger Vital Sign    Worried About Running Out of Food in the Last Year: Never true    Ran Out of Food in the Last Year: Never true  Transportation Needs: No Transportation Needs (04/20/2022)   PRAPARE - Hydrologist (Medical): No    Lack of Transportation (Non-Medical): No  Physical Activity: Inactive (04/20/2022)   Exercise Vital Sign    Days of Exercise per Week: 0 days    Minutes of Exercise per Session: 0 min  Stress: No Stress Concern Present (04/20/2022)   [place]    Feeling of Stress : Only a little  Social Connections: Socially Integrated (04/20/2022)   Social Connection and Isolation Panel [NHANES]    Frequency of Communication with Friends and Family: More than three times a week    Frequency of Social Gatherings with Friends and Family: More than three times a week    Attends Religious Services: More than 4 times per year    Active Member of [employer] or Organizations: Yes    Attends [employer]  or Organization Meetings: More than 4 times per year    Marital Status: Married  Human resources officer Violence: Not on file    Family History  Problem Relation Age of Onset   Hyperlipidemia Mother    Hypertension Mother    Stroke Mother    Heart disease Father    Hyperlipidemia Father    Atrial fibrillation Father    Melanoma Father    Arrhythmia Sister        Atrial fibrillation   Heart disease Sister    Atrial fibrillation Sister    Heart attack Brother    Breast cancer Neg Hx      Current Outpatient Medications:    Calcium Carbonate-Vitamin D 600-200 MG-UNIT TABS, Take 1 tablet by mouth daily.  , Disp: , Rfl:    cetirizine (ZYRTEC) 10 MG tablet, Take 10 mg by mouth daily., Disp: , Rfl:    Cholecalciferol 25 MCG (1000 UT) tablet, Take 1,000 Units by  mouth daily., Disp: , Rfl:    diphenhydrAMINE (BENADRYL) 25 mg capsule, Take 25 mg by mouth every 6 (six) hours as needed., Disp: , Rfl:    flecainide (TAMBOCOR) 50 MG tablet, Take 1 tablet by mouth 2 (two) times daily., Disp: , Rfl:    LORazepam (ATIVAN) 0.5 MG tablet, TAKE 1 TABLET (0.5 MG TOTAL) BY MOUTH EVERY 6 (SIX) HOURS AS NEEDED (NAUSEA OR VOMITING)., Disp: 30 tablet, Rfl: 0   midodrine (PROAMATINE) 10 MG tablet, Take by mouth., Disp: , Rfl:    mometasone (NASONEX) 50 MCG/ACT nasal spray, Place 2 sprays into the nose daily., Disp: , Rfl:    nystatin cream (MYCOSTATIN), Apply topically 2 (two) times daily., Disp: , Rfl:    OLANZapine (ZYPREXA) 10 MG tablet, TAKE 1 TABLET BY MOUTH EVERYDAY AT BEDTIME, Disp: 90 tablet, Rfl: 1   ondansetron (ZOFRAN) 8 MG tablet, TAKE 1 TABLET BY MOUTH 2 TIMES DAILY AS NEEDED. START ON THE THIRD DAY AFTER CISPLATIN CHEMOTHERAPY., Disp: 18 tablet, Rfl: 3   ondansetron (ZOFRAN-ODT) 4 MG disintegrating tablet, Take 1 tablet (4 mg total) by mouth every 8 (eight) hours as needed for nausea or vomiting., Disp: 20 tablet, Rfl: 3   pantoprazole (PROTONIX) 40 MG tablet, Take 40 mg by mouth daily., Disp: , Rfl:    Saccharomyces boulardii (PROBIOTIC) 250 MG CAPS, Take 1 capsule by mouth daily., Disp: , Rfl:    simvastatin (ZOCOR) 20 MG tablet, TAKE 1 TABLET BY MOUTH EVERY EVENING., Disp: 30 tablet, Rfl: 0   traZODone (DESYREL) 50 MG tablet, Take 50 mg by mouth at bedtime as needed., Disp: , Rfl:    Zinc Citrate-Phytase (ZYTAZE) 25-500 MG CAPS, Take 1 Dose by mouth daily., Disp: , Rfl:  No current facility-administered medications for this visit.  Facility-Administered Medications Ordered in Other Visits:    heparin lock flush 100 UNIT/ML injection, , , ,   Physical exam:  Vitals:   09/06/22 0843  BP: 120/67  Pulse: 72  Resp: 20  Temp: 98.6 F (37 C)  SpO2: 100%  Weight: 158 lb 12.8 oz (72 kg)   Physical Exam Cardiovascular:     Rate and Rhythm: Normal rate  and regular rhythm.     Heart sounds: Normal heart sounds.  Pulmonary:     Effort: Pulmonary effort is normal.     Breath sounds: Normal breath sounds.  Abdominal:     General: Bowel sounds are normal.     Palpations: Abdomen is soft.  Skin:    General: Skin is  warm and dry.  Neurological:     Mental Status: She is alert and oriented to person, place, and time.        Latest Ref Rng & Units 09/06/2022    8:22 AM  CMP  Glucose 70 - 99 mg/dL 99   BUN 8 - 23 mg/dL 11   Creatinine 0.44 - 1.00 mg/dL 0.74   Sodium 135 - 145 mmol/L 139   Potassium 3.5 - 5.1 mmol/L 3.4   Chloride 98 - 111 mmol/L 108   CO2 22 - 32 mmol/L 26   Calcium 8.9 - 10.3 mg/dL 9.1   Total Protein 6.5 - 8.1 g/dL 7.0   Total Bilirubin 0.3 - 1.2 mg/dL 1.0   Alkaline Phos 38 - 126 U/L 101   AST 15 - 41 U/L 37   ALT 0 - 44 U/L 30       Latest Ref Rng & Units 09/06/2022    8:22 AM  CBC  WBC 4.0 - 10.5 K/uL 3.8   Hemoglobin 12.0 - 15.0 g/dL 12.3   Hematocrit 36.0 - 46.0 % 35.4   Platelets 150 - 400 K/uL 96      Assessment and plan- Patient is a 61 y.o. 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  She is here for on treatment assessment prior to cycle 9 of palliative FOLFOX chemotherapy  Thrombocytopenia likely secondary to oxaliplatin.  Platelet counts are however greater than 75 and therefore counts okay to proceed with cycle 9 of palliative FOLFOX chemotherapy today.  I will see her back in 2 weeks for cycle 10.  Oxaliplatin has been on hold since cycle 8 given progressive neuropathy as well as thrombocytopenia.  Patient is meeting Dr. Elenor Quinones on 09/15/2021 and will be getting a PET scan on that day.  Based on his assessment we will see if she is a candidate for surgery down the line or not.  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 Encounter for antineoplastic chemotherapy   2. Primary  malignant neuroendocrine tumor of esophagus ([place])   3. Chemotherapy-induced thrombocytopenia   4. Hypokalemia   5. Chemotherapy-induced peripheral neuropathy ([place])      Dr. Randa Evens, MD, MPH [facility] [phone] 09/06/2022 10:29 AM

## 2022-09-08 ENCOUNTER — Other Ambulatory Visit: Payer: Self-pay

## 2022-09-08 ENCOUNTER — Encounter: Payer: Self-pay | Admitting: Oncology

## 2022-09-08 ENCOUNTER — Inpatient Hospital Stay: Payer: BC Managed Care – PPO

## 2022-09-08 DIAGNOSIS — Z5111 Encounter for antineoplastic chemotherapy: Secondary | ICD-10-CM | POA: Diagnosis not present

## 2022-09-08 DIAGNOSIS — C7A8 Other malignant neuroendocrine tumors: Secondary | ICD-10-CM

## 2022-09-08 MED ORDER — PEGFILGRASTIM-CBQV 6 MG/0.6ML ~~LOC~~ SOSY
6.0000 mg | PREFILLED_SYRINGE | Freq: Once | SUBCUTANEOUS | Status: AC
  Administered 2022-09-08: 6 mg via SUBCUTANEOUS

## 2022-09-08 MED ORDER — SODIUM CHLORIDE 0.9% FLUSH
10.0000 mL | INTRAVENOUS | Status: DC | PRN
  Administered 2022-09-08: 10 mL
  Filled 2022-09-08: qty 10

## 2022-09-08 MED ORDER — HEPARIN SOD (PORK) LOCK FLUSH 100 UNIT/ML IV SOLN
500.0000 [IU] | Freq: Once | INTRAVENOUS | Status: AC | PRN
  Administered 2022-09-08: 500 [IU]
  Filled 2022-09-08: qty 5

## 2022-09-13 ENCOUNTER — Other Ambulatory Visit: Payer: Self-pay | Admitting: *Deleted

## 2022-09-13 DIAGNOSIS — C7A8 Other malignant neuroendocrine tumors: Secondary | ICD-10-CM

## 2022-09-13 MED ORDER — LORAZEPAM 0.5 MG PO TABS: 0.5000 mg | ORAL_TABLET | Freq: Four times a day (QID) | ORAL | 2 refills | Status: DC | PRN

## 2022-09-16 ENCOUNTER — Encounter: Payer: Self-pay | Admitting: Oncology

## 2022-09-19 ENCOUNTER — Other Ambulatory Visit: Payer: Self-pay | Admitting: Oncology

## 2022-09-19 ENCOUNTER — Other Ambulatory Visit: Payer: Self-pay | Admitting: *Deleted

## 2022-09-19 DIAGNOSIS — D649 Anemia, unspecified: Secondary | ICD-10-CM

## 2022-09-19 DIAGNOSIS — C7A8 Other malignant neuroendocrine tumors: Secondary | ICD-10-CM

## 2022-09-19 MED FILL — Dexamethasone Sodium Phosphate Inj 100 MG/10ML: INTRAMUSCULAR | Qty: 1 | Status: AC

## 2022-09-20 ENCOUNTER — Inpatient Hospital Stay: Payer: BC Managed Care – PPO

## 2022-09-20 ENCOUNTER — Inpatient Hospital Stay: Payer: BC Managed Care – PPO | Admitting: Oncology

## 2022-09-20 ENCOUNTER — Other Ambulatory Visit: Payer: Self-pay

## 2022-09-22 ENCOUNTER — Inpatient Hospital Stay: Payer: BC Managed Care – PPO

## 2022-10-02 ENCOUNTER — Other Ambulatory Visit: Payer: Self-pay

## 2022-10-04 ENCOUNTER — Ambulatory Visit: Payer: BC Managed Care – PPO

## 2022-10-04 ENCOUNTER — Ambulatory Visit: Payer: BC Managed Care – PPO | Admitting: Oncology

## 2022-10-04 ENCOUNTER — Other Ambulatory Visit: Payer: BC Managed Care – PPO

## 2022-10-13 DIAGNOSIS — E039 Hypothyroidism, unspecified: Secondary | ICD-10-CM | POA: Insufficient documentation

## 2022-10-21 ENCOUNTER — Encounter: Payer: Self-pay | Admitting: Oncology

## 2022-10-21 ENCOUNTER — Other Ambulatory Visit: Payer: Self-pay

## 2022-10-21 ENCOUNTER — Inpatient Hospital Stay (HOSPITAL_BASED_OUTPATIENT_CLINIC_OR_DEPARTMENT_OTHER): Payer: BC Managed Care – PPO | Admitting: Oncology

## 2022-10-21 ENCOUNTER — Inpatient Hospital Stay: Payer: BC Managed Care – PPO | Attending: Oncology

## 2022-10-21 VITALS — BP 124/62 | HR 75 | Temp 98.2°F | Resp 16 | Ht 61.0 in | Wt 166.0 lb

## 2022-10-21 DIAGNOSIS — D649 Anemia, unspecified: Secondary | ICD-10-CM | POA: Diagnosis not present

## 2022-10-21 DIAGNOSIS — C160 Malignant neoplasm of cardia: Secondary | ICD-10-CM | POA: Diagnosis present

## 2022-10-21 DIAGNOSIS — C7A8 Other malignant neuroendocrine tumors: Secondary | ICD-10-CM

## 2022-10-21 LAB — CBC WITH DIFFERENTIAL/PLATELET
Abs Immature Granulocytes: 0 10*3/uL (ref 0.00–0.07)
Basophils Absolute: 0 10*3/uL (ref 0.0–0.1)
Basophils Relative: 0 %
Eosinophils Absolute: 0.3 10*3/uL (ref 0.0–0.5)
Eosinophils Relative: 6 %
HCT: 38.5 % (ref 36.0–46.0)
Hemoglobin: 13.3 g/dL (ref 12.0–15.0)
Immature Granulocytes: 0 %
Lymphocytes Relative: 38 %
Lymphs Abs: 1.8 10*3/uL (ref 0.7–4.0)
MCH: 36 pg — ABNORMAL HIGH (ref 26.0–34.0)
MCHC: 34.5 g/dL (ref 30.0–36.0)
MCV: 104.3 fL — ABNORMAL HIGH (ref 80.0–100.0)
Monocytes Absolute: 0.5 10*3/uL (ref 0.1–1.0)
Monocytes Relative: 11 %
Neutro Abs: 2.1 10*3/uL (ref 1.7–7.7)
Neutrophils Relative %: 45 %
Platelets: 144 10*3/uL — ABNORMAL LOW (ref 150–400)
RBC: 3.69 MIL/uL — ABNORMAL LOW (ref 3.87–5.11)
RDW: 12.1 % (ref 11.5–15.5)
WBC: 4.7 10*3/uL (ref 4.0–10.5)
nRBC: 0 % (ref 0.0–0.2)

## 2022-10-21 LAB — COMPREHENSIVE METABOLIC PANEL
ALT: 31 U/L (ref 0–44)
AST: 32 U/L (ref 15–41)
Albumin: 4.1 g/dL (ref 3.5–5.0)
Alkaline Phosphatase: 94 U/L (ref 38–126)
Anion gap: 8 (ref 5–15)
BUN: 12 mg/dL (ref 8–23)
CO2: 26 mmol/L (ref 22–32)
Calcium: 8.9 mg/dL (ref 8.9–10.3)
Chloride: 104 mmol/L (ref 98–111)
Creatinine, Ser: 0.73 mg/dL (ref 0.44–1.00)
GFR, Estimated: >60 mL/min (ref >60)
Glucose, Bld: 117 mg/dL — ABNORMAL HIGH (ref 70–99)
Potassium: 3.6 mmol/L (ref 3.5–5.1)
Sodium: 138 mmol/L (ref 135–145)
Total Bilirubin: 1 mg/dL (ref 0.3–1.2)
Total Protein: 7.2 g/dL (ref 6.5–8.1)

## 2022-10-21 LAB — IRON AND TIBC
Iron: 126 ug/dL (ref 28–170)
Saturation Ratios: 32 % — ABNORMAL HIGH (ref 10.4–31.8)
TIBC: 393 ug/dL (ref 250–450)
UIBC: 267 ug/dL

## 2022-10-21 LAB — SAMPLE TO BLOOD BANK

## 2022-10-21 LAB — FERRITIN: Ferritin: 73 ng/mL (ref 11–307)

## 2022-10-21 MED ORDER — SODIUM CHLORIDE 0.9% FLUSH
10.0000 mL | INTRAVENOUS | Status: DC | PRN
  Administered 2022-10-21: 10 mL via INTRAVENOUS
  Filled 2022-10-21: qty 10

## 2022-10-21 MED ORDER — HEPARIN SOD (PORK) LOCK FLUSH 100 UNIT/ML IV SOLN
500.0000 [IU] | Freq: Once | INTRAVENOUS | Status: AC
  Administered 2022-10-21: 500 [IU] via INTRAVENOUS
  Filled 2022-10-21: qty 5

## 2022-10-21 NOTE — Progress Notes (Signed)
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 Haig Prophet on 03/01/2022 EGD showed a medium-sized ulcerating mass with no bleeding or stigmata of recent bleeding at the GE junction..  Mass was partially obstructing.  There is also mention of another ulcerated noncircumferential mass with oozing bleeding found at the GE junction with extension into the cardia.  Both these masses were biopsied.  Pathology was pending at the time of my visit with the patient and was reported to be a poorly differentiated carcinoma.  Patient is currently able to swallow soft foods without much difficulty.  However more solid foods occasionally feel stuck.    PET was denied by insurance.  CT chest abdomen and pelvis with contrast showed asymmetric distal esophageal wall thickening with thickening extending around the lesser curvature of the stomach through the gastric cardia and proximal fundus.  Paratracheal/paraesophageal, gastrohepatic and PET retroperitoneal adenopathy.  Left periaortic lymph node measuring 17 mm at the level of left renal hilum.   Final pathology showed there were 2 specimens collected.  Specimen a was poorly differentiated carcinoma with ulceration and necrosis.v verys weakly positive for synaptophysin 50% of the cells.  Cells negative for CK7 and CK23 for the BF1 CDX2 100 Melan-A HMB45 and CD20.  Specimen B showed  large cell neuroendocrine carcinoma with neoplastic cells positive for CD56 with diffuse strong staining and majority of the neoplastic cells were also positive for synaptophysin with weak staining Ki-67 95%   Patient went to [facility] for second opinion.  Pathology was also looked at but was signed out as poorly differentiated adenocarcinoma at [facility].  Patient did not have a good response to cisplatin etoposide Tecentriq regimen.  She was therefore switched to FOLFOX chemotherapy.  CPS score was 1 and therefore Keytruda was excluded.  HER2 negative.   Patient initially received 4 cycles of cisplatin and etoposide chemotherapy up until August 2023.  Scans did not show any significant response and but she was switched to FOLFOX chemotherapy starting September 2023.  She received 9 cycles of treatment up until December 2023.  She had CT scan as well as PET scan with the PET scan being done in January 2024 at [facility].PET CT scan showed marked interval improvement in the distal esophageal hypermetabolic activity compatible with treatment effect.  Resolution of hypermetabolic thoracoabdominal adenopathy and no new sites of metastatic disease.  Plan was to hold off on any further chemotherapy.  She did see Dr. Elenor Quinones at [facility] as well and underwent repeat EGD with biopsies which were negative for malignancy.  She will therefore not undergo any curative intent surgery at this time and will continue to be monitored off treatment   Interval history-patient is doing well and denies any specific complaints at this time.  Appetite is good and she denies any difficulty swallowing.  ECOG PS- 1 Pain scale- 0   Review of systems- Review of Systems  Constitutional:  Negative for chills, fever, malaise/fatigue and  weight loss.  HENT:  Negative for congestion, ear discharge and nosebleeds.   Eyes:  Negative for blurred vision.  Respiratory:  Negative for cough, hemoptysis, sputum production, shortness of breath and wheezing.    Cardiovascular:  Negative for chest pain, palpitations, orthopnea and claudication.  Gastrointestinal:  Negative for abdominal pain, blood in stool, constipation, diarrhea, heartburn, melena, nausea and vomiting.  Genitourinary:  Negative for dysuria, flank pain, frequency, hematuria and urgency.  Musculoskeletal:  Negative for back pain, joint pain and myalgias.  Skin:  Negative for rash.  Neurological:  Negative for dizziness, tingling, focal weakness, seizures, weakness and headaches.  Endo/Heme/Allergies:  Does not bruise/bleed easily.  Psychiatric/Behavioral:  Negative for depression and suicidal ideas. The patient does not have insomnia.       Allergies  Allergen Reactions   Sulfa Antibiotics Hives    Says her tongue swelled a bit   Sulfonamide Derivatives Swelling    Tongue swelling     Past Medical History:  Diagnosis Date   Anemia    Arthritis    Atrial fibrillation ([place])    B12 deficiency    Dysrhythmia    Hyperlipidemia    Hypothyroidism    Menopause    Palpitations    Hx of, 25 years ago   Vertigo      Past Surgical History:  Procedure Laterality Date   BILATERAL SALPINGOOPHORECTOMY  2008   DILATION AND CURETTAGE OF UTERUS  08/24/2006   ESOPHAGOGASTRODUODENOSCOPY (EGD) WITH PROPOFOL N/A 03/01/2022   Procedure: ESOPHAGOGASTRODUODENOSCOPY (EGD) WITH PROPOFOL;  Surgeon: Lesly Rubenstein, MD;  Location: [facility] ENDOSCOPY;  Service: Endoscopy;  Laterality: N/A;   ESOPHAGOGASTRODUODENOSCOPY (EGD) WITH PROPOFOL N/A 05/04/2022   Procedure: ESOPHAGOGASTRODUODENOSCOPY (EGD) WITH PROPOFOL;  Surgeon: [name], MD;  Location: [facility] ENDOSCOPY;  Service: Gastroenterology;  Laterality: N/A;   incision tendon sheath for trigger finger Right    ring finger   LAPAROSCOPIC SUPRACERVICAL HYSTERECTOMY  2008   Dr. Ammie Dalton; with BSO   NOSE SURGERY  03/2008   PORTA CATH INSERTION N/A 03/21/2022   Procedure: PORTA CATH INSERTION;  Surgeon: Algernon Huxley, MD;  Location: [place] [facility];  Service: Cardiovascular;  Laterality: N/A;    Social History   Socioeconomic History   Marital status: Married    Spouse name: Not on file   Number of children: Not on file   Years of education: Not on file   Highest education level: Not on file  Occupational History   Occupation: Full time  Tobacco Use   Smoking status: Never   Smokeless tobacco: Never  Vaping Use   Vaping Use: Never used  Substance and Sexual Activity   Alcohol use: Never   Drug use: Never   Sexual activity: Not Currently    Birth control/protection: Surgical    Comment: Hysterectomy  Other Topics Concern   Not on file  Social History Narrative   Married with 2 children   Gets regular exercise   Social Determinants of Health   Financial Resource Strain: Low Risk  (04/20/2022)   Overall Financial Resource Strain (CARDIA)    Difficulty of Paying Living Expenses: Not very hard  Food Insecurity: No Food Insecurity (04/20/2022)   Hunger Vital Sign    Worried About Running Out of Food in the Last Year: Never true    Ran Out of Food in the Last Year: Never true  Transportation Needs: No Transportation Needs (04/20/2022)   PRAPARE - Hydrologist (Medical): No  Lack of Transportation (Non-Medical): No  Physical Activity: Inactive (04/20/2022)   Exercise Vital Sign    Days of Exercise per Week: 0 days    Minutes of Exercise per Session: 0 min  Stress: No Stress Concern Present (04/20/2022)   [place]    Feeling of Stress : Only a little  Social Connections: Socially Integrated (04/20/2022)   Social Connection and Isolation Panel [NHANES]    Frequency of Communication with Friends and Family: More than three times a week    Frequency of Social Gatherings with Friends and Family: More than three times a week    Attends Religious Services: More than 4 times per year    Active Member of [employer] or  Organizations: Yes    Attends Music therapist: More than 4 times per year    Marital Status: Married  Human resources officer Violence: Not on file    Family History  Problem Relation Age of Onset   Hyperlipidemia Mother    Hypertension Mother    Stroke Mother    Heart disease Father    Hyperlipidemia Father    Atrial fibrillation Father    Melanoma Father    Arrhythmia Sister        Atrial fibrillation   Heart disease Sister    Atrial fibrillation Sister    Heart attack Brother    Breast cancer Neg Hx      Current Outpatient Medications:    Calcium Carbonate-Vitamin D 600-200 MG-UNIT TABS, Take 1 tablet by mouth daily.  , Disp: , Rfl:    cetirizine (ZYRTEC) 10 MG tablet, Take 10 mg by mouth daily., Disp: , Rfl:    Cholecalciferol 25 MCG (1000 UT) tablet, Take 1,000 Units by mouth daily., Disp: , Rfl:    diphenhydrAMINE (BENADRYL) 25 mg capsule, Take 25 mg by mouth every 6 (six) hours as needed., Disp: , Rfl:    famotidine (PEPCID) 40 MG tablet, Take 40 mg by mouth daily., Disp: , Rfl:    flecainide (TAMBOCOR) 50 MG tablet, Take 1 tablet by mouth 2 (two) times daily., Disp: , Rfl:    LORazepam (ATIVAN) 0.5 MG tablet, Take 1 tablet (0.5 mg total) by mouth every 6 (six) hours as needed (Nausea or vomiting)., Disp: 30 tablet, Rfl: 2   midodrine (PROAMATINE) 10 MG tablet, Take by mouth., Disp: , Rfl:    mometasone (NASONEX) 50 MCG/ACT nasal spray, Place 2 sprays into the nose daily., Disp: , Rfl:    nystatin cream (MYCOSTATIN), Apply topically 2 (two) times daily., Disp: , Rfl:    OLANZapine (ZYPREXA) 10 MG tablet, TAKE 1 TABLET BY MOUTH EVERYDAY AT BEDTIME, Disp: 90 tablet, Rfl: 1   ondansetron (ZOFRAN) 8 MG tablet, TAKE 1 TABLET BY MOUTH 2 TIMES DAILY AS NEEDED. START ON THE THIRD DAY AFTER CISPLATIN CHEMOTHERAPY., Disp: 18 tablet, Rfl: 3   ondansetron (ZOFRAN-ODT) 4 MG disintegrating tablet, Take 1 tablet (4 mg total) by mouth every 8 (eight) hours as needed for nausea or  vomiting., Disp: 20 tablet, Rfl: 3   pantoprazole (PROTONIX) 40 MG tablet, Take 40 mg by mouth daily., Disp: , Rfl:    Saccharomyces boulardii (PROBIOTIC) 250 MG CAPS, Take 1 capsule by mouth daily., Disp: , Rfl:    simvastatin (ZOCOR) 20 MG tablet, TAKE 1 TABLET BY MOUTH EVERY EVENING., Disp: 30 tablet, Rfl: 0   traZODone (DESYREL) 50 MG tablet, Take 50 mg by mouth at bedtime as needed., Disp: , Rfl:  Zinc Citrate-Phytase (ZYTAZE) 25-500 MG CAPS, Take 1 Dose by mouth daily., Disp: , Rfl:  No current facility-administered medications for this visit.  Facility-Administered Medications Ordered in Other Visits:    heparin lock flush 100 UNIT/ML injection, , , ,   Physical exam:  Vitals:   10/21/22 1108  BP: 124/62  Pulse: 75  Resp: 16  Temp: 98.2 F (36.8 C)  TempSrc: Tympanic  SpO2: 99%  Weight: 166 lb (75.3 kg)  Height: 5' 1"$  (1.549 m)   Physical Exam Cardiovascular:     Rate and Rhythm: Normal rate and regular rhythm.     Heart sounds: Normal heart sounds.  Pulmonary:     Effort: Pulmonary effort is normal.     Breath sounds: Normal breath sounds.  Abdominal:     General: Bowel sounds are normal. There is no distension.     Palpations: Abdomen is soft.     Tenderness: There is no abdominal tenderness.  Lymphadenopathy:     Comments: No palpable supraclavicular adenopathy.  Skin:    General: Skin is warm and dry.  Neurological:     Mental Status: She is alert and oriented to person, place, and time.         Latest Ref Rng & Units 10/21/2022   10:52 AM  CMP  Glucose 70 - 99 mg/dL 117   BUN 8 - 23 mg/dL 12   Creatinine 0.44 - 1.00 mg/dL 0.73   Sodium 135 - 145 mmol/L 138   Potassium 3.5 - 5.1 mmol/L 3.6   Chloride 98 - 111 mmol/L 104   CO2 22 - 32 mmol/L 26   Calcium 8.9 - 10.3 mg/dL 8.9   Total Protein 6.5 - 8.1 g/dL 7.2   Total Bilirubin 0.3 - 1.2 mg/dL 1.0   Alkaline Phos 38 - 126 U/L 94   AST 15 - 41 U/L 32   ALT 0 - 44 U/L 31       Latest Ref Rng &  Units 10/21/2022   10:52 AM  CBC  WBC 4.0 - 10.5 K/uL 4.7   Hemoglobin 12.0 - 15.0 g/dL 13.3   Hematocrit 36.0 - 46.0 % 38.5   Platelets 150 - 400 K/uL 144      Assessment and plan- Patient is a 62 y.o. 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  She is s/p 9 cycles of palliative FOLFOX and 5-FU chemotherapy.  Recent PET scan shows NED and she is currently being monitored off treatment  Clinically patient is doing well with no concerning signs and symptoms of recurrence based on today's exam.  She was recently seen by [facility] Dr. Elenor Quinones as well and underwent repeat EGD with biopsies which were negative for malignancy.  PET scan in January 2024 was NED.  Plan is to monitor her off treatment at this time.  She has repeat CT scans in EGD with biopsy scheduled at [facility] in May 2024.  I will see her thereafter over here with labs CBC with differential CMP and CEA   Visit Diagnosis 1. 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 Symptomatic anemia      Dr. Randa Evens, MD, MPH [facility] [id number] 10/21/2022 1:34 PM

## 2022-10-22 ENCOUNTER — Other Ambulatory Visit: Payer: Self-pay

## 2022-10-23 LAB — CEA: CEA: 1.9 ng/mL (ref 0.0–4.7)

## 2022-12-13 ENCOUNTER — Other Ambulatory Visit: Payer: Self-pay

## 2022-12-20 ENCOUNTER — Other Ambulatory Visit: Payer: Self-pay

## 2023-01-16 ENCOUNTER — Inpatient Hospital Stay
Admission: RE | Admit: 2023-01-16 | Discharge: 2023-01-16 | Disposition: A | Discharge status: Home / Self Care | Payer: Self-pay | Source: Ambulatory Visit | Attending: Oncology | Admitting: Oncology

## 2023-01-16 ENCOUNTER — Encounter: Payer: Self-pay | Admitting: Oncology

## 2023-01-16 ENCOUNTER — Inpatient Hospital Stay (HOSPITAL_BASED_OUTPATIENT_CLINIC_OR_DEPARTMENT_OTHER): Payer: BC Managed Care – PPO | Admitting: Oncology

## 2023-01-16 ENCOUNTER — Inpatient Hospital Stay: Payer: BC Managed Care – PPO | Attending: Oncology

## 2023-01-16 ENCOUNTER — Other Ambulatory Visit: Payer: Self-pay

## 2023-01-16 VITALS — BP 137/68 | HR 58 | Temp 98.1°F | Resp 17 | Wt 158.0 lb

## 2023-01-16 DIAGNOSIS — C7A8 Other malignant neuroendocrine tumors: Secondary | ICD-10-CM | POA: Diagnosis not present

## 2023-01-16 DIAGNOSIS — C7A1 Malignant poorly differentiated neuroendocrine tumors: Secondary | ICD-10-CM | POA: Diagnosis present

## 2023-01-16 DIAGNOSIS — D649 Anemia, unspecified: Secondary | ICD-10-CM

## 2023-01-16 LAB — COMPREHENSIVE METABOLIC PANEL
ALT: 23 U/L (ref 0–44)
AST: 24 U/L (ref 15–41)
Albumin: 4.3 g/dL (ref 3.5–5.0)
Alkaline Phosphatase: 78 U/L (ref 38–126)
Anion gap: 9 (ref 5–15)
BUN: 16 mg/dL (ref 8–23)
CO2: 25 mmol/L (ref 22–32)
Calcium: 9.1 mg/dL (ref 8.9–10.3)
Chloride: 101 mmol/L (ref 98–111)
Creatinine, Ser: 0.8 mg/dL (ref 0.44–1.00)
GFR, Estimated: >60 mL/min (ref >60)
Glucose, Bld: 97 mg/dL (ref 70–99)
Potassium: 3.7 mmol/L (ref 3.5–5.1)
Sodium: 135 mmol/L (ref 135–145)
Total Bilirubin: 1.3 mg/dL — ABNORMAL HIGH (ref 0.3–1.2)
Total Protein: 7.2 g/dL (ref 6.5–8.1)

## 2023-01-16 LAB — CBC WITH DIFFERENTIAL/PLATELET
Abs Immature Granulocytes: 0.01 10*3/uL (ref 0.00–0.07)
Basophils Absolute: 0 10*3/uL (ref 0.0–0.1)
Basophils Relative: 1 %
Eosinophils Absolute: 0.3 10*3/uL (ref 0.0–0.5)
Eosinophils Relative: 5 %
HCT: 36.1 % (ref 36.0–46.0)
Hemoglobin: 12.6 g/dL (ref 12.0–15.0)
Immature Granulocytes: 0 %
Lymphocytes Relative: 41 %
Lymphs Abs: 2.2 10*3/uL (ref 0.7–4.0)
MCH: 33.2 pg (ref 26.0–34.0)
MCHC: 34.9 g/dL (ref 30.0–36.0)
MCV: 95 fL (ref 80.0–100.0)
Monocytes Absolute: 0.5 10*3/uL (ref 0.1–1.0)
Monocytes Relative: 10 %
Neutro Abs: 2.3 10*3/uL (ref 1.7–7.7)
Neutrophils Relative %: 43 %
Platelets: 167 10*3/uL (ref 150–400)
RBC: 3.8 MIL/uL — ABNORMAL LOW (ref 3.87–5.11)
RDW: 11.9 % (ref 11.5–15.5)
WBC: 5.3 10*3/uL (ref 4.0–10.5)
nRBC: 0 % (ref 0.0–0.2)

## 2023-01-16 MED ORDER — HEPARIN SOD (PORK) LOCK FLUSH 100 UNIT/ML IV SOLN
500.0000 [IU] | Freq: Once | INTRAVENOUS | Status: AC
  Administered 2023-01-16: 500 [IU] via INTRAVENOUS
  Filled 2023-01-16: qty 5

## 2023-01-16 MED ORDER — SODIUM CHLORIDE 0.9% FLUSH
10.0000 mL | Freq: Once | INTRAVENOUS | Status: AC
  Administered 2023-01-16: 10 mL via INTRAVENOUS
  Filled 2023-01-16: qty 10

## 2023-01-16 NOTE — Progress Notes (Signed)
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 Mia Creek on 03/01/2022 EGD showed a medium-sized ulcerating mass with no bleeding or stigmata of recent bleeding at the GE junction..  Mass was partially obstructing.  There is also mention of another ulcerated noncircumferential mass with oozing bleeding found at the GE junction with extension into the cardia.  Both these masses were biopsied.  Pathology was pending at the time of my visit with the patient and was reported to be a poorly differentiated carcinoma.  Patient is currently able to swallow soft foods without much difficulty.  However more solid foods occasionally feel stuck.    PET was denied by insurance.  CT chest abdomen and pelvis with contrast showed asymmetric distal esophageal wall thickening with thickening extending around the lesser curvature of the stomach through the gastric cardia and proximal fundus.  Paratracheal/paraesophageal, gastrohepatic and PET retroperitoneal adenopathy.  Left periaortic lymph node measuring 17 mm at the level of left renal hilum.   Final pathology showed there were 2 specimens collected.  Specimen a was poorly differentiated carcinoma with ulceration and necrosis.v verys weakly positive for synaptophysin 50% of the cells.  Cells negative for CK7 and CK23 for the BF1 CDX2 100 Melan-A HMB45 and CD20.  Specimen B showed  large cell neuroendocrine carcinoma with neoplastic cells positive for CD56 with diffuse strong staining and majority of the neoplastic cells were also positive for synaptophysin with weak staining Ki-67 95%   Patient went to [facility] for second opinion.  Pathology was also looked at but was signed out as poorly differentiated adenocarcinoma at [facility].  Patient did not have a good response to cisplatin etoposide Tecentriq regimen.  She was therefore switched to FOLFOX chemotherapy.  CPS score was 1 and therefore Keytruda was excluded.  HER2 negative.   Patient initially received 4 cycles of cisplatin and etoposide chemotherapy up until August 2023.  Scans did not show any significant response and but she was switched to FOLFOX chemotherapy starting September 2023.  She received 9 cycles of treatment up until December 2023.   She had CT scan as well as PET scan with the PET scan being done in January 2024 at [facility].PET CT scan showed marked interval improvement in the distal esophageal hypermetabolic activity compatible with treatment effect.  Resolution of hypermetabolic thoracoabdominal adenopathy and no new sites of metastatic disease.  Plan was to hold off on any further chemotherapy.  She did see Dr. Ewing Schlein at [facility] as well and underwent repeat EGD with biopsies which were negative for malignancy.  She will therefore not undergo any curative intent surgery at this time and will continue to be monitored off treatment  Interval history-patient feels well presently.  Denies any difficulty swallowing.  Appetite and weight have remained stable  ECOG PS- 1 Pain scale- 0   Review of systems- Review of Systems  Constitutional:  Negative for chills, fever, malaise/fatigue and weight loss.  HENT:  Negative for  congestion, ear discharge and nosebleeds.   Eyes:  Negative for blurred vision.  Respiratory:  Negative for cough, hemoptysis, sputum production, shortness of breath and wheezing.   Cardiovascular:   Negative for chest pain, palpitations, orthopnea and claudication.  Gastrointestinal:  Negative for abdominal pain, blood in stool, constipation, diarrhea, heartburn, melena, nausea and vomiting.  Genitourinary:  Negative for dysuria, flank pain, frequency, hematuria and urgency.  Musculoskeletal:  Negative for back pain, joint pain and myalgias.  Skin:  Negative for rash.  Neurological:  Negative for dizziness, tingling, focal weakness, seizures, weakness and headaches.  Endo/Heme/Allergies:  Does not bruise/bleed easily.  Psychiatric/Behavioral:  Negative for depression and suicidal ideas. The patient does not have insomnia.       Allergies  Allergen Reactions   Sulfa Antibiotics Hives    Says her tongue swelled a bit   Sulfonamide Derivatives Swelling    Tongue swelling     Past Medical History:  Diagnosis Date   Anemia    Arthritis    Atrial fibrillation (HCC)    B12 deficiency    Dysrhythmia    Hyperlipidemia    Hypothyroidism    Menopause    Palpitations    Hx of, 25 years ago   Vertigo      Past Surgical History:  Procedure Laterality Date   BILATERAL SALPINGOOPHORECTOMY  2008   DILATION AND CURETTAGE OF UTERUS  08/24/2006   ESOPHAGOGASTRODUODENOSCOPY (EGD) WITH PROPOFOL N/A 03/01/2022   Procedure: ESOPHAGOGASTRODUODENOSCOPY (EGD) WITH PROPOFOL;  Surgeon: Regis Bill, MD;  Location: [facility] ENDOSCOPY;  Service: Endoscopy;  Laterality: N/A;   ESOPHAGOGASTRODUODENOSCOPY (EGD) WITH PROPOFOL N/A 05/04/2022   Procedure: ESOPHAGOGASTRODUODENOSCOPY (EGD) WITH PROPOFOL;  Surgeon: [name], MD;  Location: [facility] ENDOSCOPY;  Service: Gastroenterology;  Laterality: N/A;   incision tendon sheath for trigger finger Right    ring finger   LAPAROSCOPIC SUPRACERVICAL HYSTERECTOMY  2008   Dr. Arvil Chaco; with BSO   NOSE SURGERY  03/2008   PORTA CATH INSERTION N/A 03/21/2022   Procedure: PORTA CATH INSERTION;  Surgeon: Annice Needy, MD;  Location: [facility] INVASIVE CV LAB;   Service: Cardiovascular;  Laterality: N/A;    Social History   Socioeconomic History   Marital status: Married    Spouse name: Not on file   Number of children: Not on file   Years of education: Not on file   Highest education level: Not on file  Occupational History   Occupation: Full time  Tobacco Use   Smoking status: Never   Smokeless tobacco: Never  Vaping Use   Vaping Use: Never used  Substance and Sexual Activity   Alcohol use: Never   Drug use: Never   Sexual activity: Not Currently    Birth control/protection: Surgical    Comment: Hysterectomy  Other Topics Concern   Not on file  Social History Narrative   Married with 2 children   Gets regular exercise   Social Determinants of Health   Financial Resource Strain: Low Risk  (04/20/2022)   Overall Financial Resource Strain (CARDIA)    Difficulty of Paying Living Expenses: Not very hard  Food Insecurity: No Food Insecurity (04/20/2022)   Hunger Vital Sign    Worried About Running Out of Food in the Last Year: Never true    Ran Out of Food in the Last Year: Never true  Transportation Needs: No Transportation Needs (04/20/2022)   PRAPARE - Administrator, Civil Service (Medical): No    Lack of Transportation (Non-Medical):  No  Physical Activity: Inactive (04/20/2022)   Exercise Vital Sign    Days of Exercise per Week: 0 days    Minutes of Exercise per Session: 0 min  Stress: No Stress Concern Present (04/20/2022)   [employer] of Occupational Health - Occupational Stress Questionnaire    Feeling of Stress : Only a little  Social Connections: Socially Integrated (04/20/2022)   Social Connection and Isolation Panel [NHANES]    Frequency of Communication with Friends and Family: More than three times a week    Frequency of Social Gatherings with Friends and Family: More than three times a week    Attends Religious Services: More than 4 times per year    Active Member of [employer] or Organizations: Yes     Attends Engineer, structural: More than 4 times per year    Marital Status: Married  Catering manager Violence: Not on file    Family History  Problem Relation Age of Onset   Hyperlipidemia Mother    Hypertension Mother    Stroke Mother    Heart disease Father    Hyperlipidemia Father    Atrial fibrillation Father    Melanoma Father    Arrhythmia Sister        Atrial fibrillation   Heart disease Sister    Atrial fibrillation Sister    Heart attack Brother    Breast cancer Neg Hx      Current Outpatient Medications:    famotidine (PEPCID) 40 MG tablet, Take 1 tablet by mouth at bedtime., Disp: , Rfl:    simvastatin (ZOCOR) 20 MG tablet, Take 1 tablet by mouth at bedtime., Disp: , Rfl:    aspirin EC 81 MG tablet, Take by mouth., Disp: , Rfl:    Calcium Carbonate-Vitamin D 600-200 MG-UNIT TABS, Take 1 tablet by mouth daily.  , Disp: , Rfl:    cetirizine (ZYRTEC) 10 MG tablet, Take 10 mg by mouth daily., Disp: , Rfl:    Cholecalciferol 25 MCG (1000 UT) tablet, Take 1,000 Units by mouth daily., Disp: , Rfl:    diphenhydrAMINE (BENADRYL) 25 mg capsule, Take 25 mg by mouth every 6 (six) hours as needed., Disp: , Rfl:    flecainide (TAMBOCOR) 50 MG tablet, Take 1 tablet by mouth 2 (two) times daily., Disp: , Rfl:    LORazepam (ATIVAN) 0.5 MG tablet, Take 1 tablet (0.5 mg total) by mouth every 6 (six) hours as needed (Nausea or vomiting)., Disp: 30 tablet, Rfl: 2   midodrine (PROAMATINE) 10 MG tablet, Take by mouth., Disp: , Rfl:    mometasone (NASONEX) 50 MCG/ACT nasal spray, Place 2 sprays into the nose daily., Disp: , Rfl:    nystatin cream (MYCOSTATIN), Apply topically 2 (two) times daily., Disp: , Rfl:    OLANZapine (ZYPREXA) 10 MG tablet, TAKE 1 TABLET BY MOUTH EVERYDAY AT BEDTIME, Disp: 90 tablet, Rfl: 1   ondansetron (ZOFRAN) 8 MG tablet, TAKE 1 TABLET BY MOUTH 2 TIMES DAILY AS NEEDED. START ON THE THIRD DAY AFTER CISPLATIN CHEMOTHERAPY., Disp: 18 tablet, Rfl: 3    pantoprazole (PROTONIX) 40 MG tablet, Take 40 mg by mouth daily., Disp: , Rfl:    polyethylene glycol (MIRALAX / GLYCOLAX) 17 g packet, Take by mouth., Disp: , Rfl:    Saccharomyces boulardii (PROBIOTIC) 250 MG CAPS, Take 1 capsule by mouth daily., Disp: , Rfl:    simvastatin (ZOCOR) 20 MG tablet, TAKE 1 TABLET BY MOUTH EVERY EVENING., Disp: 30 tablet, Rfl: 0   Zinc Citrate-Phytase (  ZYTAZE) 25-500 MG CAPS, Take 1 Dose by mouth daily., Disp: , Rfl:  No current facility-administered medications for this visit.  Facility-Administered Medications Ordered in Other Visits:    heparin lock flush 100 UNIT/ML injection, , , ,   Physical exam: There were no vitals filed for this visit. Physical Exam Cardiovascular:     Rate and Rhythm: Normal rate and regular rhythm.     Heart sounds: Normal heart sounds.  Pulmonary:     Effort: Pulmonary effort is normal.     Breath sounds: Normal breath sounds.  Abdominal:     General: Bowel sounds are normal.     Palpations: Abdomen is soft.  Skin:    General: Skin is warm and dry.  Neurological:     Mental Status: She is alert and oriented to person, place, and time.         Latest Ref Rng & Units 10/21/2022   10:52 AM  CMP  Glucose 70 - 99 mg/dL 161   BUN 8 - 23 mg/dL 12   Creatinine 0.96 - 1.00 mg/dL 0.45   Sodium 409 - 811 mmol/L 138   Potassium 3.5 - 5.1 mmol/L 3.6   Chloride 98 - 111 mmol/L 104   CO2 22 - 32 mmol/L 26   Calcium 8.9 - 10.3 mg/dL 8.9   Total Protein 6.5 - 8.1 g/dL 7.2   Total Bilirubin 0.3 - 1.2 mg/dL 1.0   Alkaline Phos 38 - 126 U/L 94   AST 15 - 41 U/L 32   ALT 0 - 44 U/L 31       Latest Ref Rng & Units 10/21/2022   10:52 AM  CBC  WBC 4.0 - 10.5 K/uL 4.7   Hemoglobin 12.0 - 15.0 g/dL 91.4   Hematocrit 78.2 - 46.0 % 38.5   Platelets 150 - 400 K/uL 144      Assessment and plan- Patient is a 62 y.o. 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  She is s/p 9 cycles of palliative FOLFOX  and 5-FU chemotherapy.  She is being monitored.  This is a routine follow-up visit  I am awaiting CT and PET scan images from [facility] to be pushed over to our system.  Patient had CT scan May 2024 at [facility] which showed that the retroperitoneal adenopathy appeared more prominent as compared to her prior PET scan.  She had not had a near complete metabolic response on PET scanAnd therefore she was being monitored off treatment.  There are no new sites of metastatic disease.  It is difficult to ascertain if the prominent retroperitoneal adenopathy truly represents progressive disease in the absence of chemotherapy.  At this time the plan is to get a repeat CT in 3 months which is going to be scheduled at [facility] around August 8.  I will see her a week from that point and based on the CT scan we will decide if patient needs further PET scan or biopsies before considering further treatment.  Patient is also undergoing surveillance endoscopies at [facility] and had one 3 days ago the results of which are pending.  I will see her in mid August 2024 with CT scans done prior at [facility]   Visit Diagnosis 1. Primary malignant neuroendocrine tumor of esophagus (HCC)      Dr. Owens Shark, MD, MPH [facility] [phone] 01/16/2023 9:50 AM

## 2023-01-16 NOTE — Progress Notes (Signed)
Patient here for oncology follow-up appointment, concerns of constipation °  °

## 2023-01-17 ENCOUNTER — Other Ambulatory Visit: Payer: Self-pay

## 2023-01-18 LAB — CEA: CEA: 1.5 ng/mL (ref 0.0–4.7)

## 2023-01-20 ENCOUNTER — Other Ambulatory Visit: Payer: BC Managed Care – PPO

## 2023-01-20 ENCOUNTER — Ambulatory Visit: Payer: BC Managed Care – PPO | Admitting: Oncology

## 2023-03-08 ENCOUNTER — Other Ambulatory Visit: Payer: Self-pay

## 2023-04-20 ENCOUNTER — Telehealth: Payer: Self-pay | Admitting: *Deleted

## 2023-04-20 ENCOUNTER — Telehealth: Payer: Self-pay | Admitting: Oncology

## 2023-04-20 NOTE — Telephone Encounter (Signed)
Patient has covid and they moved her endoscopy  out due to that. She can come 9/3 for labs and port flush, then see Smith Robert on 9/9 at 10:15. If you need to change the dates just call back or send my chart

## 2023-04-20 NOTE — Telephone Encounter (Signed)
This patient called to let us know she has tested positive for COVID and had to reschedule her scan at [facility]. They rescheduled that to 9/5. She wonders if she should come in for flush prior to that? She also request her appointment be moved to after 9/5. Please advise.  Thank you

## 2023-04-21 ENCOUNTER — Other Ambulatory Visit: Payer: Self-pay

## 2023-04-28 ENCOUNTER — Other Ambulatory Visit: Payer: BC Managed Care – PPO

## 2023-04-28 ENCOUNTER — Ambulatory Visit: Payer: BC Managed Care – PPO | Admitting: Oncology

## 2023-05-11 ENCOUNTER — Other Ambulatory Visit: Payer: Self-pay | Admitting: Internal Medicine

## 2023-05-11 DIAGNOSIS — Z1231 Encounter for screening mammogram for malignant neoplasm of breast: Secondary | ICD-10-CM

## 2023-05-12 ENCOUNTER — Other Ambulatory Visit: Payer: Self-pay

## 2023-05-16 ENCOUNTER — Inpatient Hospital Stay: Payer: BC Managed Care – PPO | Attending: Oncology

## 2023-05-16 DIAGNOSIS — Z95828 Presence of other vascular implants and grafts: Secondary | ICD-10-CM

## 2023-05-16 DIAGNOSIS — Z5111 Encounter for antineoplastic chemotherapy: Secondary | ICD-10-CM | POA: Insufficient documentation

## 2023-05-16 DIAGNOSIS — C7A1 Malignant poorly differentiated neuroendocrine tumors: Secondary | ICD-10-CM | POA: Diagnosis not present

## 2023-05-16 DIAGNOSIS — C7A8 Other malignant neuroendocrine tumors: Secondary | ICD-10-CM

## 2023-05-16 DIAGNOSIS — C7B8 Other secondary neuroendocrine tumors: Secondary | ICD-10-CM | POA: Insufficient documentation

## 2023-05-16 DIAGNOSIS — D649 Anemia, unspecified: Secondary | ICD-10-CM | POA: Insufficient documentation

## 2023-05-16 DIAGNOSIS — G893 Neoplasm related pain (acute) (chronic): Secondary | ICD-10-CM | POA: Insufficient documentation

## 2023-05-16 LAB — IRON AND TIBC
Iron: 51 ug/dL (ref 28–170)
Saturation Ratios: 19 % (ref 10.4–31.8)
TIBC: 274 ug/dL (ref 250–450)
UIBC: 223 ug/dL

## 2023-05-16 LAB — CBC WITH DIFFERENTIAL/PLATELET
Abs Immature Granulocytes: 0.01 10*3/uL (ref 0.00–0.07)
Basophils Absolute: 0 10*3/uL (ref 0.0–0.1)
Basophils Relative: 1 %
Eosinophils Absolute: 0.3 10*3/uL (ref 0.0–0.5)
Eosinophils Relative: 6 %
HCT: 31.1 % — ABNORMAL LOW (ref 36.0–46.0)
Hemoglobin: 10.3 g/dL — ABNORMAL LOW (ref 12.0–15.0)
Immature Granulocytes: 0 %
Lymphocytes Relative: 33 %
Lymphs Abs: 1.7 10*3/uL (ref 0.7–4.0)
MCH: 32.7 pg (ref 26.0–34.0)
MCHC: 33.1 g/dL (ref 30.0–36.0)
MCV: 98.7 fL (ref 80.0–100.0)
Monocytes Absolute: 0.5 10*3/uL (ref 0.1–1.0)
Monocytes Relative: 10 %
Neutro Abs: 2.6 10*3/uL (ref 1.7–7.7)
Neutrophils Relative %: 50 %
Platelets: 233 10*3/uL (ref 150–400)
RBC: 3.15 MIL/uL — ABNORMAL LOW (ref 3.87–5.11)
RDW: 12.9 % (ref 11.5–15.5)
WBC: 5 10*3/uL (ref 4.0–10.5)
nRBC: 0 % (ref 0.0–0.2)

## 2023-05-16 LAB — COMPREHENSIVE METABOLIC PANEL
ALT: 10 U/L (ref 0–44)
AST: 14 U/L — ABNORMAL LOW (ref 15–41)
Albumin: 3.9 g/dL (ref 3.5–5.0)
Alkaline Phosphatase: 59 U/L (ref 38–126)
Anion gap: 10 (ref 5–15)
BUN: 25 mg/dL — ABNORMAL HIGH (ref 8–23)
CO2: 22 mmol/L (ref 22–32)
Calcium: 8.8 mg/dL — ABNORMAL LOW (ref 8.9–10.3)
Chloride: 101 mmol/L (ref 98–111)
Creatinine, Ser: 0.89 mg/dL (ref 0.44–1.00)
GFR, Estimated: >60 mL/min (ref >60)
Glucose, Bld: 91 mg/dL (ref 70–99)
Potassium: 3.9 mmol/L (ref 3.5–5.1)
Sodium: 133 mmol/L — ABNORMAL LOW (ref 135–145)
Total Bilirubin: 0.6 mg/dL (ref 0.3–1.2)
Total Protein: 7 g/dL (ref 6.5–8.1)

## 2023-05-16 LAB — FERRITIN: Ferritin: 94 ng/mL (ref 11–307)

## 2023-05-16 MED ORDER — SODIUM CHLORIDE 0.9% FLUSH
10.0000 mL | Freq: Once | INTRAVENOUS | Status: DC
  Filled 2023-05-16: qty 10

## 2023-05-16 MED ORDER — HEPARIN SOD (PORK) LOCK FLUSH 100 UNIT/ML IV SOLN
500.0000 [IU] | Freq: Once | INTRAVENOUS | Status: DC
  Filled 2023-05-16: qty 5

## 2023-05-17 LAB — CEA: CEA: 3.1 ng/mL (ref 0.0–4.7)

## 2023-05-18 ENCOUNTER — Inpatient Hospital Stay
Admission: RE | Admit: 2023-05-18 | Discharge: 2023-05-18 | Disposition: A | Discharge status: Home / Self Care | Payer: Self-pay | Source: Ambulatory Visit | Attending: Oncology | Admitting: Oncology

## 2023-05-18 ENCOUNTER — Other Ambulatory Visit: Payer: Self-pay

## 2023-05-18 DIAGNOSIS — C7A8 Other malignant neuroendocrine tumors: Secondary | ICD-10-CM

## 2023-05-19 ENCOUNTER — Encounter: Payer: Self-pay | Admitting: Oncology

## 2023-05-19 ENCOUNTER — Other Ambulatory Visit: Payer: Self-pay | Admitting: *Deleted

## 2023-05-19 ENCOUNTER — Telehealth: Payer: Self-pay | Admitting: *Deleted

## 2023-05-19 ENCOUNTER — Telehealth: Payer: Self-pay

## 2023-05-19 MED ORDER — OXYCODONE HCL 5 MG PO TABS: 5.0000 mg | ORAL_TABLET | ORAL | 0 refills | Status: DC | PRN

## 2023-05-19 NOTE — Telephone Encounter (Signed)
Images received from [facility]. Request sent to radiology to obtain biopsy for NGS testing.

## 2023-05-19 NOTE — Telephone Encounter (Signed)
Patient called and left message that he cancer is back in multiple places and that she is having pain for which Tylenol is not helping. She is asking if doctor can order her something for pain. Please advise

## 2023-05-19 NOTE — Telephone Encounter (Signed)
Med pending to you to sign off on, Patient informed of prescription being sent

## 2023-05-19 NOTE — Telephone Encounter (Signed)
Please send her oxycodone 5 mg Q4 prn for pain 60 tab and we will discuss more on monday

## 2023-05-22 ENCOUNTER — Other Ambulatory Visit: Payer: Self-pay | Admitting: *Deleted

## 2023-05-22 ENCOUNTER — Inpatient Hospital Stay (HOSPITAL_BASED_OUTPATIENT_CLINIC_OR_DEPARTMENT_OTHER): Payer: BC Managed Care – PPO | Admitting: Oncology

## 2023-05-22 ENCOUNTER — Encounter: Payer: Self-pay | Admitting: Oncology

## 2023-05-22 ENCOUNTER — Telehealth: Payer: Self-pay | Admitting: *Deleted

## 2023-05-22 ENCOUNTER — Other Ambulatory Visit: Payer: BC Managed Care – PPO

## 2023-05-22 VITALS — BP 110/59 | HR 74 | Temp 96.3°F | Resp 18 | Wt 154.5 lb

## 2023-05-22 DIAGNOSIS — Z7189 Other specified counseling: Secondary | ICD-10-CM

## 2023-05-22 DIAGNOSIS — C155 Malignant neoplasm of lower third of esophagus: Secondary | ICD-10-CM

## 2023-05-22 DIAGNOSIS — C7A8 Other malignant neuroendocrine tumors: Secondary | ICD-10-CM

## 2023-05-22 DIAGNOSIS — Z5111 Encounter for antineoplastic chemotherapy: Secondary | ICD-10-CM | POA: Diagnosis not present

## 2023-05-22 MED ORDER — LIDOCAINE-PRILOCAINE 2.5-2.5 % EX CREA
TOPICAL_CREAM | CUTANEOUS | 3 refills | Status: DC
Start: 2023-05-22 — End: 2023-07-12

## 2023-05-22 MED ORDER — ONDANSETRON HCL 8 MG PO TABS
8.0000 mg | ORAL_TABLET | Freq: Three times a day (TID) | ORAL | 1 refills | Status: DC | PRN
Start: 2023-05-22 — End: 2023-07-17

## 2023-05-22 MED ORDER — LOPERAMIDE HCL 2 MG PO CAPS
ORAL_CAPSULE | ORAL | 2 refills | Status: DC
Start: 2023-05-22 — End: 2023-07-12

## 2023-05-22 MED ORDER — PROCHLORPERAZINE MALEATE 10 MG PO TABS
10.0000 mg | ORAL_TABLET | Freq: Four times a day (QID) | ORAL | 1 refills | Status: DC | PRN
Start: 2023-05-22 — End: 2023-06-08

## 2023-05-22 MED ORDER — DEXAMETHASONE 4 MG PO TABS
8.0000 mg | ORAL_TABLET | Freq: Every day | ORAL | 5 refills | Status: DC
Start: 2023-05-22 — End: 2023-07-12

## 2023-05-22 MED ORDER — FENTANYL 12 MCG/HR TD PT72: 1.0000 | MEDICATED_PATCH | TRANSDERMAL | 0 refills | Status: DC

## 2023-05-22 NOTE — Progress Notes (Signed)
Patient has a couple of questions regarding recent CT SCAN.

## 2023-05-22 NOTE — Progress Notes (Signed)
Pharmacist Chemotherapy Monitoring - Initial Assessment    Anticipated start date: 05/30/23   The following has been reviewed per standard work regarding the patient's treatment regimen: The patient's diagnosis, treatment plan and drug doses, and organ/hematologic function Lab orders and baseline tests specific to treatment regimen  The treatment plan start date, drug sequencing, and pre-medications Prior authorization status  Patient's documented medication list, including drug-drug interaction screen and prescriptions for anti-emetics and supportive care specific to the treatment regimen The drug concentrations, fluid compatibility, administration routes, and timing of the medications to be used The patient's access for treatment and lifetime cumulative dose history, if applicable  The patient's medication allergies and previous infusion related reactions, if applicable   Changes made to treatment plan:  N/A  Follow up needed:  Pending authorization for treatment    Ebony Hail, Pharm.D., CPP 05/22/2023@1 :42 PM

## 2023-05-22 NOTE — Progress Notes (Signed)
Oley Balm, MD sent to [facility] PROCEDURE / BIOPSY REVIEW Date: 05/19/23  Requested Biopsy site: LAN left paraaortic Reason for request: LAN Imaging review: Best seen on CT 05/18/23  Decision: Approved Imaging modality to perform: CT Schedule with: Moderate Sedation Schedule for: Any VIR  Additional comments: @VIR : L paraaortic below L renal art see CT  Please contact me with questions, concerns, or if issue pertaining to this request arise.  Dayne Oley Balm, MD [facility]

## 2023-05-22 NOTE — Telephone Encounter (Signed)
/  While the patient was here I gave her all the instructions for the abdominal biopsy on 9/20.  She will arrive at 8:30 for a 9:30 appt. she will go to the front of the hospital to the heart and vascular for this biopsy.  She knows not to eat or drink 8 hours prior to this.  She knows that she needs a driver.  And she will take the last aspirin on 9/14.  Then after the biopsy you can go back on aspirin.  I went over the instructions as well as gave them a piece of paper saying all this above

## 2023-05-22 NOTE — Progress Notes (Signed)
DISCONTINUE OFF PATHWAY REGIMEN - Other   [id number] (Leucovorin IV D1 + Fluorouracil IV D1/CIV D1,2 + Oxaliplatin IV D1) q14 Days:   A cycle is every 14 days:     Oxaliplatin      Leucovorin      Fluorouracil      Fluorouracil   **Always confirm dose/schedule in your pharmacy ordering system**  REASON: Disease Progression PRIOR TREATMENT: [id number] (Leucovorin IV D1 + Fluorouracil IV D1/CIV D1,2 + Oxaliplatin IV D1) q14 Days TREATMENT RESPONSE: Progressive Disease (PD)  START OFF PATHWAY REGIMEN - Other   [id number] (Leucovorin IV D1 + Fluorouracil IV D1/CIV D1,2 + Irinotecan IV D1) q14 Days:   A cycle is every 14 days:     Irinotecan      Leucovorin      Fluorouracil      Fluorouracil   **Always confirm dose/schedule in your pharmacy ordering system**  Patient Characteristics: Intent of Therapy: Non-Curative / Palliative Intent, Discussed with Patient 

## 2023-05-23 ENCOUNTER — Encounter: Payer: Self-pay | Admitting: Oncology

## 2023-05-24 ENCOUNTER — Telehealth: Payer: Self-pay | Admitting: *Deleted

## 2023-05-24 ENCOUNTER — Encounter: Payer: Self-pay | Admitting: Oncology

## 2023-05-24 ENCOUNTER — Inpatient Hospital Stay (HOSPITAL_BASED_OUTPATIENT_CLINIC_OR_DEPARTMENT_OTHER): Payer: BC Managed Care – PPO | Admitting: Hospice and Palliative Medicine

## 2023-05-24 DIAGNOSIS — G893 Neoplasm related pain (acute) (chronic): Secondary | ICD-10-CM | POA: Diagnosis not present

## 2023-05-24 DIAGNOSIS — C7A8 Other malignant neuroendocrine tumors: Secondary | ICD-10-CM | POA: Diagnosis not present

## 2023-05-24 NOTE — Telephone Encounter (Signed)
Can you call her?

## 2023-05-24 NOTE — Telephone Encounter (Signed)
Josh NP will call patient today to discuss these concerns.

## 2023-05-24 NOTE — Telephone Encounter (Signed)
Patient called reporting that she put on her first Fentanyl patch Monday and she started having shortness of breath on Tuesday to the point that she had to stop and sit to catch her breath before proceeding to walk. She states that she has noticed that her ppain is better, but she also had nausea which her antiemetic has helped control. Sh eis asking if there is anything else that could be ordered for her pain

## 2023-05-24 NOTE — Telephone Encounter (Signed)
Josh to call patient to further discuss concerns.

## 2023-05-25 ENCOUNTER — Encounter: Payer: Self-pay | Admitting: Oncology

## 2023-05-25 NOTE — Progress Notes (Signed)
Virtual Visit via Telephone Note  I connected with Ariana Wilson on 05/25/23 at  3:20 PM EDT by a video enabled telemedicine application and verified that I am speaking with the correct person using two identifiers.  Location: Patient: Home Provider: Clinic   I discussed the limitations of evaluation and management by telemedicine and the availability of in person appointments. The patient expressed understanding and agreed to proceed.  History of Present Illness: Ariana Wilson is a 62 y.o. 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  She was diagnosed with cancer in June 2023.   Observations/Objective: Patient recently started on transdermal fentanyl by Dr. Smith Robert.  Telephone visit today to discuss side effects.  Patient reports that pain is greatly improved on transdermal fentanyl.  However, she has noticed some intermittent shortness of breath.  Denies cough or congestion.  No chest pain.  Denies rashes or pruritus.  No fever or chills or other symptomatic complaints.  She wondered if symptoms could be stemming from fentanyl.  Patient says that she feels the fentanyl has been more effective than the oxycodone.  She also reports that oxycodone causes nausea and she feels like she has not tolerated pain medications well in the past.  Assessment and Plan: Neoplasm related pain -discussed options for discontinuing fentanyl and rotating to another long-acting opioid versus managing symptomatic complaints as overall pain is improved.  Patient would like to give the fentanyl more time as she feels that is working well.  Recommended that she keep [place] informed of any changes or concerns.  Case and plan discussed with Dr. Smith Robert  Follow Up Instructions: RTC next week as previously scheduled   I discussed the assessment and treatment plan with the patient. The patient was provided an opportunity to ask questions and all were answered. The  patient agreed with the plan and demonstrated an understanding of the instructions.   The patient was advised to call back or seek an in-person evaluation if the symptoms worsen or if the condition fails to improve as anticipated.  I provided 10 minutes of non-face-to-face time during this encounter.   Malachy Moan, NP

## 2023-05-27 ENCOUNTER — Encounter: Payer: Self-pay | Admitting: Oncology

## 2023-05-28 ENCOUNTER — Encounter: Payer: Self-pay | Admitting: Oncology

## 2023-05-28 NOTE — Progress Notes (Signed)
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 Mia Creek on 03/01/2022 EGD showed a medium-sized ulcerating mass with no bleeding or stigmata of recent bleeding at the GE junction..  Mass was partially obstructing.  There is also mention of another ulcerated noncircumferential mass with oozing bleeding found at the GE junction with extension into the cardia.  Both these masses were biopsied.  Pathology was pending at the time of my visit with the patient and was reported to be a poorly differentiated carcinoma.  Patient is currently able to swallow soft foods without much difficulty.  However more solid foods occasionally feel stuck.    PET was denied by insurance.  CT chest abdomen and pelvis with contrast showed asymmetric distal esophageal wall thickening with thickening extending around the lesser curvature of the stomach through the gastric cardia and proximal fundus.  Paratracheal/paraesophageal, gastrohepatic and PET retroperitoneal adenopathy.  Left periaortic lymph node measuring 17 mm at the level of left renal hilum.   Final pathology showed there were 2 specimens collected.  Specimen a was poorly differentiated carcinoma with ulceration and necrosis.v verys weakly positive for synaptophysin 50% of the cells.  Cells negative for CK7 and CK23 for the BF1 CDX2 100 Melan-A HMB45 and CD20.   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  Pathology was also looked at but was signed out as poorly differentiated adenocarcinoma at [facility].  Patient did not have a good response to cisplatin etoposide Tecentriq regimen.  She was therefore switched to FOLFOX chemotherapy.  CPS score was 1 and therefore Keytruda was excluded.  HER2 negative.   Patient initially received 4 cycles of cisplatin and etoposide chemotherapy up until August 2023.  Scans did not show any significant response and but she was switched to FOLFOX chemotherapy starting September 2023.  She received 9 cycles of treatment up until December 2023.   She had CT scan as well as PET scan with the PET scan being done in January 2024 at [facility].PET CT scan showed marked interval improvement in the distal esophageal hypermetabolic activity compatible with treatment effect.  Resolution of hypermetabolic thoracoabdominal adenopathy and no new sites of metastatic disease.  Plan was to hold off on any further chemotherapy.  She did see Dr. Ewing Schlein at [facility] as well and underwent repeat EGD with biopsies which were negative for malignancy.  She is off treatment since December 2023  Patient underwent repeat staging scans on 05/18/2023 at [facility].  CT abdomen showed markedly worsening multistation adenopathy in the upper abdomen and retroperitoneum with masslike nodal conglomerate involving gastrohepatic and porta hepatis spaces.  New left adrenal metastases.  CT chest showed increased size of multiple mediastinal lymph nodes concerning for progressive metastatic disease.  Interval history-patient reports worsening mid back pain.  She just started taking as needed oxycodone but is having to take it every 4 hours and sometimes it wakes her up  in the middle of the night because of pain as well.  Appetite and weight have remained stable.  ECOG PS- 1 Pain scale- 5  Review of systems- Review of Systems  Constitutional:  Positive for malaise/fatigue. Negative for chills, fever and weight loss.  HENT:  Negative for congestion, ear discharge and nosebleeds.   Eyes:  Negative for blurred vision.  Respiratory:  Negative for cough, hemoptysis, sputum production, shortness of breath and wheezing.   Cardiovascular:  Negative for chest pain, palpitations, orthopnea and claudication.  Gastrointestinal:  Negative for abdominal pain, blood in stool, constipation, diarrhea, heartburn, melena, nausea and vomiting.  Genitourinary:  Negative for dysuria, flank pain, frequency, hematuria and urgency.  Musculoskeletal:  Positive for back pain. Negative for joint pain and myalgias.  Skin:  Negative for rash.  Neurological:  Negative for dizziness, tingling, focal weakness, seizures, weakness and headaches.  Endo/Heme/Allergies:  Does not bruise/bleed easily.  Psychiatric/Behavioral:  Negative for depression and suicidal ideas. The patient does not have insomnia.       Allergies  Allergen Reactions   Sulfa Antibiotics Hives    Says her tongue swelled a bit   Sulfonamide Derivatives Swelling    Tongue swelling     Past Medical History:  Diagnosis Date   Anemia    Arthritis    Atrial fibrillation (HCC)    B12 deficiency    Dysrhythmia    Hyperlipidemia    Hypothyroidism    Menopause    Palpitations    Hx of, 25 years ago   Vertigo      Past Surgical History:  Procedure Laterality Date   BILATERAL SALPINGOOPHORECTOMY  2008   DILATION AND CURETTAGE OF UTERUS  08/24/2006   ESOPHAGOGASTRODUODENOSCOPY (EGD) WITH PROPOFOL N/A 03/01/2022   Procedure: ESOPHAGOGASTRODUODENOSCOPY (EGD) WITH PROPOFOL;  Surgeon: Regis Bill, MD;  Location: [facility] ENDOSCOPY;  Service: Endoscopy;  Laterality: N/A;   ESOPHAGOGASTRODUODENOSCOPY (EGD) WITH  PROPOFOL N/A 05/04/2022   Procedure: ESOPHAGOGASTRODUODENOSCOPY (EGD) WITH PROPOFOL;  Surgeon: [name], MD;  Location: [facility] ENDOSCOPY;  Service: Gastroenterology;  Laterality: N/A;   incision tendon sheath for trigger finger Right    ring finger   LAPAROSCOPIC SUPRACERVICAL HYSTERECTOMY  2008   Dr. Arvil Chaco; with BSO   NOSE SURGERY  03/2008   PORTA CATH INSERTION N/A 03/21/2022   Procedure: PORTA CATH INSERTION;  Surgeon: Annice Needy, MD;  Location: [facility] INVASIVE CV LAB;  Service: Cardiovascular;  Laterality: N/A;    Social History   Socioeconomic History   Marital status: Married    Spouse name: Not on file   Number of children: Not on file   Years of education: Not on file   Highest education level: Not on file  Occupational History   Occupation: Full time  Tobacco Use   Smoking status: Never   Smokeless tobacco: Never  Vaping Use   Vaping status: Never Used  Substance and Sexual Activity   Alcohol use: Never   Drug use: Never   Sexual activity: Not Currently    Birth control/protection: Surgical    Comment: Hysterectomy  Other Topics Concern   Not on file  Social History Narrative   Married with 2 children   Gets regular exercise   Social Determinants of Health   Financial Resource Strain: Low Risk  (04/18/2023)   Received from [facility]   Overall  Financial Resource Strain (CARDIA)    Difficulty of Paying Living Expenses: Not hard at all  Food Insecurity: No Food Insecurity (04/18/2023)   [facility]   Hunger Vital Sign    Worried About Running Out of Food in the Last Year: Never true    Ran Out of Food in the Last Year: Never true  Transportation Needs: No Transportation Needs (04/18/2023)   Received from [facility] - Transportation    In the past 12 months, has lack of transportation kept you from medical appointments or from getting medications?: No    Lack of Transportation  (Non-Medical): No  Physical Activity: Inactive (04/20/2022)   Exercise Vital Sign    Days of Exercise per Week: 0 days    Minutes of Exercise per Session: 0 min  Stress: No Stress Concern Present (04/20/2022)   [employer] of Occupational Health - Occupational Stress Questionnaire    Feeling of Stress : Only a little  Social Connections: Socially Integrated (04/20/2022)   Social Connection and Isolation Panel [NHANES]    Frequency of Communication with Friends and Family: More than three times a week    Frequency of Social Gatherings with Friends and Family: More than three times a week    Attends Religious Services: More than 4 times per year    Active Member of [employer] or Organizations: Yes    Attends Engineer, structural: More than 4 times per year    Marital Status: Married  Catering manager Violence: Not on file    Family History  Problem Relation Age of Onset   Hyperlipidemia Mother    Hypertension Mother    Stroke Mother    Heart disease Father    Hyperlipidemia Father    Atrial fibrillation Father    Melanoma Father    Arrhythmia Sister        Atrial fibrillation   Heart disease Sister    Atrial fibrillation Sister    Heart attack Brother    Breast cancer Neg Hx      Current Outpatient Medications:    aspirin EC 81 MG tablet, Take by mouth., Disp: , Rfl:    Calcium Carbonate-Vitamin D 600-200 MG-UNIT TABS, Take 1 tablet by mouth daily.  , Disp: , Rfl:    cetirizine (ZYRTEC) 10 MG tablet, Take 10 mg by mouth daily., Disp: , Rfl:    Cholecalciferol 25 MCG (1000 UT) tablet, Take 1,000 Units by mouth daily., Disp: , Rfl:    famotidine (PEPCID) 40 MG tablet, Take 1 tablet by mouth at bedtime., Disp: , Rfl:    flecainide (TAMBOCOR) 50 MG tablet, Take 1 tablet by mouth 2 (two) times daily., Disp: , Rfl:    midodrine (PROAMATINE) 10 MG tablet, Take by mouth., Disp: , Rfl:    mometasone (NASONEX) 50 MCG/ACT nasal spray, Place 2 sprays into the nose daily., Disp: ,  Rfl:    oxyCODONE (OXY IR/ROXICODONE) 5 MG immediate release tablet, Take 1 tablet (5 mg total) by mouth every 4 (four) hours as needed for severe pain., Disp: 60 tablet, Rfl: 0   pantoprazole (PROTONIX) 40 MG tablet, Take 40 mg by mouth daily., Disp: , Rfl:    polyethylene glycol (MIRALAX / GLYCOLAX) 17 g packet, Take by mouth., Disp: , Rfl:    psyllium (METAMUCIL) 58.6 % powder, Take 1 packet by mouth 3 (three) times daily., Disp: , Rfl:    Saccharomyces boulardii (PROBIOTIC) 250 MG CAPS, Take 1  capsule by mouth daily., Disp: , Rfl:    simvastatin (ZOCOR) 20 MG tablet, Take 1 tablet by mouth at bedtime., Disp: , Rfl:    dexamethasone (DECADRON) 4 MG tablet, Take 2 tablets (8 mg total) by mouth daily. Start the day after chemotherapy for 2 days. Take with food., Disp: 8 tablet, Rfl: 5   diphenhydrAMINE (BENADRYL) 25 mg capsule, Take 25 mg by mouth every 6 (six) hours as needed. (Patient not taking: Reported on 01/16/2023), Disp: , Rfl:    docusate sodium (COLACE) 50 MG capsule, Take 50 mg by mouth 2 (two) times daily., Disp: , Rfl:    fentaNYL (DURAGESIC) 12 MCG/HR, Place 1 patch onto the skin every 3 (three) days., Disp: 10 patch, Rfl: 0   lidocaine-prilocaine (EMLA) cream, Apply to affected area once, Disp: 30 g, Rfl: 3   loperamide (IMODIUM) 2 MG capsule, Take 2 tabs by mouth with first loose stool, then 1 tab with each additional loose stool as needed. Do not exceed 8 tabs in a 24-hour period, Disp: 60 capsule, Rfl: 2   LORazepam (ATIVAN) 0.5 MG tablet, Take 1 tablet (0.5 mg total) by mouth every 6 (six) hours as needed (Nausea or vomiting)., Disp: 30 tablet, Rfl: 2   nystatin cream (MYCOSTATIN), Apply topically 2 (two) times daily., Disp: , Rfl:    OLANZapine (ZYPREXA) 10 MG tablet, TAKE 1 TABLET BY MOUTH EVERYDAY AT BEDTIME (Patient not taking: Reported on 01/16/2023), Disp: 90 tablet, Rfl: 1   ondansetron (ZOFRAN) 8 MG tablet, TAKE 1 TABLET BY MOUTH 2 TIMES DAILY AS NEEDED. START ON THE THIRD DAY  AFTER CISPLATIN CHEMOTHERAPY. (Patient not taking: Reported on 01/16/2023), Disp: 18 tablet, Rfl: 3   ondansetron (ZOFRAN) 8 MG tablet, Take 1 tablet (8 mg total) by mouth every 8 (eight) hours as needed for nausea, vomiting or refractory nausea / vomiting. Start on the third day after chemotherapy., Disp: 30 tablet, Rfl: 1   prochlorperazine (COMPAZINE) 10 MG tablet, Take 1 tablet (10 mg total) by mouth every 6 (six) hours as needed for nausea or vomiting., Disp: 30 tablet, Rfl: 1   simvastatin (ZOCOR) 20 MG tablet, TAKE 1 TABLET BY MOUTH EVERY EVENING., Disp: 30 tablet, Rfl: 0   Zinc Citrate-Phytase (ZYTAZE) 25-500 MG CAPS, Take 1 Dose by mouth daily. (Patient not taking: Reported on 01/16/2023), Disp: , Rfl:  No current facility-administered medications for this visit.  Facility-Administered Medications Ordered in Other Visits:    heparin lock flush 100 UNIT/ML injection, , , ,   Physical exam:  Vitals:   05/22/23 1019  BP: (!) 110/59  Pulse: 74  Resp: 18  Temp: (!) 96.3 F (35.7 C)  TempSrc: Tympanic  SpO2: 100%  Weight: 154 lb 8 oz (70.1 kg)   Physical Exam Cardiovascular:     Rate and Rhythm: Normal rate and regular rhythm.     Heart sounds: Normal heart sounds.  Pulmonary:     Effort: Pulmonary effort is normal.     Breath sounds: Normal breath sounds.  Abdominal:     General: Bowel sounds are normal.     Palpations: Abdomen is soft.  Skin:    General: Skin is warm and dry.  Neurological:     Mental Status: She is alert and oriented to person, place, and time.         Latest Ref Rng & Units 05/16/2023   10:40 AM  CMP  Glucose 70 - 99 mg/dL 91   BUN 8 - 23 mg/dL 25  Creatinine 0.44 - 1.00 mg/dL 8.84   Sodium 166 - 063 mmol/L 133   Potassium 3.5 - 5.1 mmol/L 3.9   Chloride 98 - 111 mmol/L 101   CO2 22 - 32 mmol/L 22   Calcium 8.9 - 10.3 mg/dL 8.8   Total Protein 6.5 - 8.1 g/dL 7.0   Total Bilirubin 0.3 - 1.2 mg/dL 0.6   Alkaline Phos 38 - 126 U/L 59   AST 15 - 41  U/L 14   ALT 0 - 44 U/L 10       Latest Ref Rng & Units 05/16/2023   10:40 AM  CBC  WBC 4.0 - 10.5 K/uL 5.0   Hemoglobin 12.0 - 15.0 g/dL 01.6   Hematocrit 01.0 - 46.0 % 31.1   Platelets 150 - 400 K/uL 233       No results found.   Assessment and plan- Patient is a 62 y.o. female with history of stage IV adenocarcinoma of the GE junction with retroperitoneal lymph node involvement s/p 9 cycles of palliative FOLFOX chemotherapy.  She has been monitored off treatment since December 2023.  She is here to discuss CT scan results and further management.  I have reviewed CT chest abdomen pelvis images that were transferred from [facility].  I have reviewed the report as well.  Patient had stage IV adenocarcinoma of the esophagus with retroperitoneal lymph node involvement.  She was on FOLFOX chemotherapy until December 2023 and since then she has been monitored off treatment.  CT scan presently shows worsening adenopathy in the upper mediastinum as well as retroperitoneal lymph node involvement.  Left adrenal involvement.  She therefore needs to be restarted on systemic treatment.  I am planning to get a CT-guided biopsy of the retroperitoneal lymph node to run NGS testing to see if there are any acquired mutations.  At baseline she was HER2 negative and her CPS score was less than 5 and therefore I do not plan to incorporate immunotherapy at this time.  She is also received multiple cycles of oxaliplatin which can potentially worsen her neuropathy as well as risk of infusion reaction.  I therefore plan to give her FOLFIRI chemotherapy at this time every 2 weeks until progression or toxicity.  Discussed risks and benefits of chemotherapy including all but not limited to nausea, vomiting, low blood counts, risk of infections and hospitalizations.  Risk of diarrhea associated with irinotecan.  Treatment will be given with a palliative intent.  Patient understands and agrees to proceed as planned.  I will  tentatively plan to start chemotherapy in 1 week's time  Neoplasm related pain: She will continue as needed oxycodone but I will add fentanyl patch 12 mcg as well.  We will adjust her pain medications in due course.   Visit Diagnosis 1. Goals of care, counseling/discussion   2. Malignant tumor of lower third of esophagus (HCC)      Dr. Owens Shark, MD, MPH [facility] [phone] 05/28/2023 10:41 AM

## 2023-05-29 ENCOUNTER — Other Ambulatory Visit: Payer: BC Managed Care – PPO

## 2023-05-29 ENCOUNTER — Encounter: Payer: Self-pay | Admitting: Oncology

## 2023-05-29 ENCOUNTER — Inpatient Hospital Stay (HOSPITAL_BASED_OUTPATIENT_CLINIC_OR_DEPARTMENT_OTHER): Payer: BC Managed Care – PPO | Admitting: Hospice and Palliative Medicine

## 2023-05-29 ENCOUNTER — Telehealth: Payer: Self-pay

## 2023-05-29 ENCOUNTER — Inpatient Hospital Stay: Payer: BC Managed Care – PPO

## 2023-05-29 ENCOUNTER — Other Ambulatory Visit: Payer: Self-pay | Admitting: Oncology

## 2023-05-29 ENCOUNTER — Other Ambulatory Visit: Payer: Self-pay

## 2023-05-29 ENCOUNTER — Telehealth: Payer: Self-pay | Admitting: *Deleted

## 2023-05-29 ENCOUNTER — Other Ambulatory Visit: Payer: Self-pay | Admitting: *Deleted

## 2023-05-29 VITALS — BP 114/54 | HR 77 | Temp 98.3°F | Resp 18

## 2023-05-29 DIAGNOSIS — D649 Anemia, unspecified: Secondary | ICD-10-CM

## 2023-05-29 DIAGNOSIS — K922 Gastrointestinal hemorrhage, unspecified: Secondary | ICD-10-CM | POA: Diagnosis not present

## 2023-05-29 DIAGNOSIS — R195 Other fecal abnormalities: Secondary | ICD-10-CM | POA: Diagnosis not present

## 2023-05-29 DIAGNOSIS — Z5111 Encounter for antineoplastic chemotherapy: Secondary | ICD-10-CM | POA: Diagnosis not present

## 2023-05-29 DIAGNOSIS — C7A8 Other malignant neuroendocrine tumors: Secondary | ICD-10-CM

## 2023-05-29 LAB — CBC (CANCER CENTER ONLY)
HCT: 23.2 % — ABNORMAL LOW (ref 36.0–46.0)
Hemoglobin: 7.3 g/dL — ABNORMAL LOW (ref 12.0–15.0)
MCH: 33.6 pg (ref 26.0–34.0)
MCHC: 31.5 g/dL (ref 30.0–36.0)
MCV: 106.9 fL — ABNORMAL HIGH (ref 80.0–100.0)
Platelet Count: 265 10*3/uL (ref 150–400)
RBC: 2.17 MIL/uL — ABNORMAL LOW (ref 3.87–5.11)
RDW: 16.3 % — ABNORMAL HIGH (ref 11.5–15.5)
WBC Count: 6 10*3/uL (ref 4.0–10.5)
nRBC: 0 % (ref 0.0–0.2)

## 2023-05-29 LAB — PREPARE RBC (CROSSMATCH)

## 2023-05-29 MED FILL — Dexamethasone Sodium Phosphate Inj 100 MG/10ML: INTRAMUSCULAR | Qty: 1 | Status: AC

## 2023-05-29 NOTE — Telephone Encounter (Signed)
Pt accepted apt for 1pm today. Cbc today/hold tube. Per v/o Dr. Smith Robert if major drop in hgb- pt needs to be referred back to [facility] for an endoscopy.

## 2023-05-29 NOTE — Telephone Encounter (Signed)
Per Myles Lipps can we add her for an EGD this week please. Called and left a message for call back. Can add for Wednesday

## 2023-05-29 NOTE — Progress Notes (Signed)
Symptom Management Clinic [facility] at [facility] [phone]) [phone] [phone]  Patient Care Team: Danella Penton, MD as PCP - General (Internal Medicine) Benita Gutter, RN as Oncology Nurse Navigator Creig Hines, MD as Consulting Physician (Oncology)   NAME OF PATIENT: Ariana Wilson  [id number]  [birth year]   DATE OF VISIT: 05/29/23  REASON FOR CONSULT: Ariana Wilson is a 62 y.o. 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  She was diagnosed with cancer in June 2023. .   INTERVAL HISTORY: Patient presents [facility] with several days of fatigue and dark-colored stools.  She denies pain.  No nausea or vomiting.  No changes in oral intake.  Denies any neurologic complaints. Reports fair appetite. Denies chest pain. Denies any nausea, vomiting, constipation, or diarrhea. Denies urinary complaints. Patient offers no further specific complaints today.  PAST MEDICAL HISTORY: Past Medical History:  Diagnosis Date   Anemia    Arthritis    Atrial fibrillation (HCC)    B12 deficiency    Dysrhythmia    Hyperlipidemia    Hypothyroidism    Menopause    Palpitations    Hx of, 25 years ago   Vertigo     PAST SURGICAL HISTORY:  Past Surgical History:  Procedure Laterality Date   BILATERAL SALPINGOOPHORECTOMY  2008   DILATION AND CURETTAGE OF UTERUS  08/24/2006   ESOPHAGOGASTRODUODENOSCOPY (EGD) WITH PROPOFOL N/A 03/01/2022   Procedure: ESOPHAGOGASTRODUODENOSCOPY (EGD) WITH PROPOFOL;  Surgeon: Regis Bill, MD;  Location: [facility] ENDOSCOPY;  Service: Endoscopy;  Laterality: N/A;   ESOPHAGOGASTRODUODENOSCOPY (EGD) WITH PROPOFOL N/A 05/04/2022   Procedure: ESOPHAGOGASTRODUODENOSCOPY (EGD) WITH PROPOFOL;  Surgeon: [name], MD;  Location: [facility] ENDOSCOPY;  Service: Gastroenterology;  Laterality: N/A;   incision tendon sheath for trigger finger Right    ring finger   LAPAROSCOPIC  SUPRACERVICAL HYSTERECTOMY  2008   Dr. Arvil Chaco; with BSO   NOSE SURGERY  03/2008   PORTA CATH INSERTION N/A 03/21/2022   Procedure: PORTA CATH INSERTION;  Surgeon: Annice Needy, MD;  Location: [facility] INVASIVE CV LAB;  Service: Cardiovascular;  Laterality: N/A;    HEMATOLOGY/ONCOLOGY HISTORY:  Oncology History  Primary malignant neuroendocrine tumor of esophagus (HCC)  03/20/2022 Initial Diagnosis   Primary malignant neuroendocrine neoplasm of esophagus (HCC)   03/20/2022 Cancer Staging   Staging form: Esophagus - Other Histologies, AJCC 8th Edition - Clinical stage from 03/20/2022: cTX, cN1, cM1, G3 - Signed by Creig Hines, MD on 03/20/2022 Histopathologic type: Large cell neuroendocrine carcinoma Histologic grading system: 3 grade system   03/30/2022 - 04/29/2022 Chemotherapy   Patient is on Treatment Plan : NEUROENDOCRINE GE junctionCisplatin/Tecentriq D1 + Etoposide D1-3 q21d     05/17/2022 - 09/08/2022 Chemotherapy   Patient is on Treatment Plan : GASTROESOPHAGEAL FOLFOX q14d      06/06/2023 -  Chemotherapy   Patient is on Treatment Plan : GE junction adenocarcinoma FOLFIRI q14d     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  MEDICATIONS:  Current Outpatient Medications  Medication Sig Dispense Refill   Calcium Carbonate-Vitamin D 600-200 MG-UNIT TABS Take 1 tablet by mouth daily.  cetirizine (ZYRTEC) 10 MG tablet Take 10 mg by mouth daily.     Cholecalciferol 25 MCG (1000 UT) tablet Take 1,000 Units by mouth daily.     famotidine (PEPCID) 40 MG tablet Take 1 tablet by mouth at bedtime.     fentaNYL (DURAGESIC) 12 MCG/HR Place 1 patch onto the skin every 3 (three) days. 10 patch 0   flecainide (TAMBOCOR) 50  MG tablet Take 1 tablet by mouth 2 (two) times daily.     lidocaine-prilocaine (EMLA) cream Apply to affected area once 30 g 3   midodrine (PROAMATINE) 10 MG tablet Take by mouth.     mometasone (NASONEX) 50 MCG/ACT nasal spray Place 2 sprays into the nose daily.     ondansetron (ZOFRAN) 8 MG tablet Take 1 tablet (8 mg total) by mouth every 8 (eight) hours as needed for nausea, vomiting or refractory nausea / vomiting. Start on the third day after chemotherapy. 30 tablet 1   oxyCODONE (OXY IR/ROXICODONE) 5 MG immediate release tablet Take 1 tablet (5 mg total) by mouth every 4 (four) hours as needed for severe pain. 60 tablet 0   pantoprazole (PROTONIX) 40 MG tablet Take 40 mg by mouth daily.     polyethylene glycol (MIRALAX / GLYCOLAX) 17 g packet Take by mouth.     prochlorperazine (COMPAZINE) 10 MG tablet Take 1 tablet (10 mg total) by mouth every 6 (six) hours as needed for nausea or vomiting. 30 tablet 1   psyllium (METAMUCIL) 58.6 % powder Take 1 packet by mouth 3 (three) times daily.     Saccharomyces boulardii (PROBIOTIC) 250 MG CAPS Take 1 capsule by mouth daily.     simvastatin (ZOCOR) 20 MG tablet Take 1 tablet by mouth at bedtime.     dexamethasone (DECADRON) 4 MG tablet Take 2 tablets (8 mg total) by mouth daily. Start the day after chemotherapy for 2 days. Take with food. (Patient not taking: Reported on 05/29/2023) 8 tablet 5   loperamide (IMODIUM) 2 MG capsule Take 2 tabs by mouth with first loose stool, then 1 tab with each additional loose stool as needed. Do not exceed 8 tabs in a 24-hour period (Patient not taking: Reported on 05/29/2023) 60 capsule 2   Zinc Citrate-Phytase (ZYTAZE) 25-500 MG CAPS Take 1 Dose by mouth daily. (Patient not taking: Reported on 01/16/2023)     No current facility-administered medications for this visit.   Facility-Administered Medications Ordered in Other Visits  Medication Dose Route Frequency Provider Last Rate Last Admin   heparin lock flush 100  UNIT/ML injection             VITAL SIGNS: BP (!) 114/54   Pulse 77   Temp 98.3 F (36.8 C) (Tympanic)   Resp 18   SpO2 100%  There were no vitals filed for this visit.  Estimated body mass index is 29.19 kg/m as calculated from the following:   Height as of 10/21/22: 5\' 1"  (1.549 m).   Weight as of 05/22/23: 154 lb 8 oz (70.1 kg).  LABS: CBC:    Component Value Date/Time   WBC 6.0 05/29/2023 1315   WBC 5.0 05/16/2023 1040   HGB 7.3 (L) 05/29/2023 1315   HCT 23.2 (L) 05/29/2023 1315   PLT 265 05/29/2023 1315   MCV 106.9 (H) 05/29/2023 1315   NEUTROABS 2.6 05/16/2023 1040   LYMPHSABS 1.7 05/16/2023 1040   MONOABS 0.5 05/16/2023 1040   EOSABS 0.3 05/16/2023 1040   BASOSABS 0.0 05/16/2023 1040  Comprehensive Metabolic Panel:    Component Value Date/Time   NA 133 (L) 05/16/2023 1040   K 3.9 05/16/2023 1040   CL 101 05/16/2023 1040   CO2 22 05/16/2023 1040   BUN 25 (H) 05/16/2023 1040   CREATININE 0.89 05/16/2023 1040   GLUCOSE 91 05/16/2023 1040   CALCIUM 8.8 (L) 05/16/2023 1040   AST 14 (L) 05/16/2023 1040   ALT 10 05/16/2023 1040   ALKPHOS 59 05/16/2023 1040   BILITOT 0.6 05/16/2023 1040   BILITOT 0.6 03/06/2015 0910   PROT 7.0 05/16/2023 1040   PROT 6.6 03/06/2015 0910   ALBUMIN 3.9 05/16/2023 1040   ALBUMIN 4.6 03/06/2015 0910    RADIOGRAPHIC STUDIES: No results found.  PERFORMANCE STATUS (ECOG) : 1 - Symptomatic but completely ambulatory  Review of Systems Unless otherwise noted, a complete review of systems is negative.  Physical Exam General: NAD Cardiovascular: regular rate and rhythm Pulmonary: clear ant fields Abdomen: soft, nontender, + bowel sounds GU: no suprapubic tenderness Extremities: no edema, no joint deformities Skin: no rashes Neurological: Weakness but otherwise nonfocal  IMPRESSION/PLAN: Stage IV cancer GE junction -recent CT scan shows worsening mediastinal and retroperitoneal adenopathy.  Plan is to initiate FOLFIRI  chemotherapy.  Symptomatic anemia -suspect GI bleed likely secondary to her GE junction malignancy.  Hemoglobin dropped from 10.3 two weeks ago to 7.3 today.  Case discussed with Dr. Smith Robert and will proceed with blood transfusion tomorrow.  Patient is being referred back to GI (Dr. Tobi Bastos) for EGD later this week.  ED triggers reviewed in detail with patient.  Blood transfusion tomorrow, CT on Friday, referral to GI for EGD   Patient expressed understanding and was in agreement with this plan. She also understands that She can call clinic at any time with any questions, concerns, or complaints.   Thank you for allowing me to participate in the care of this very pleasant patient.   Time Total: 20 minutes  Visit consisted of counseling and education dealing with the complex and emotionally intense issues of symptom management in the setting of serious illness.Greater than 50%  of this time was spent counseling and coordinating care related to the above assessment and plan.  Signed by: Laurette Schimke, PhD, NP-C

## 2023-05-29 NOTE — Telephone Encounter (Signed)
Vm left for patient to return my phone call re: dark stools. Offered an apt in [facility] today with Josh and to check labs. Pt sent mychart msg back accepted apt at 1pm.

## 2023-05-29 NOTE — Telephone Encounter (Signed)
Patient called back and we schedule for 06/01/2023 with Dr. Tobi Bastos. Went over instructions, and sent to [employer]

## 2023-05-30 ENCOUNTER — Inpatient Hospital Stay: Payer: BC Managed Care – PPO

## 2023-05-30 ENCOUNTER — Encounter: Payer: Self-pay | Admitting: Oncology

## 2023-05-30 ENCOUNTER — Inpatient Hospital Stay: Payer: BC Managed Care – PPO | Admitting: Oncology

## 2023-05-30 VITALS — BP 101/46 | HR 82 | Temp 97.4°F | Resp 18

## 2023-05-30 DIAGNOSIS — K922 Gastrointestinal hemorrhage, unspecified: Secondary | ICD-10-CM

## 2023-05-30 DIAGNOSIS — Z5111 Encounter for antineoplastic chemotherapy: Secondary | ICD-10-CM | POA: Diagnosis not present

## 2023-05-30 DIAGNOSIS — D649 Anemia, unspecified: Secondary | ICD-10-CM

## 2023-05-30 DIAGNOSIS — C7A8 Other malignant neuroendocrine tumors: Secondary | ICD-10-CM

## 2023-05-30 DIAGNOSIS — R195 Other fecal abnormalities: Secondary | ICD-10-CM

## 2023-05-30 MED ORDER — ONDANSETRON HCL 4 MG/2ML IJ SOLN: 4.0000 mg | Freq: Once | INTRAMUSCULAR | Status: DC

## 2023-05-30 MED ORDER — ONDANSETRON HCL 4 MG PO TABS
4.0000 mg | ORAL_TABLET | Freq: Once | ORAL | Status: DC
  Filled 2023-05-30: qty 1

## 2023-05-30 MED ORDER — SODIUM CHLORIDE 0.9% IV SOLUTION
250.0000 mL | Freq: Once | INTRAVENOUS | Status: AC
  Administered 2023-05-30: 250 mL via INTRAVENOUS
  Filled 2023-05-30: qty 250

## 2023-05-30 MED ORDER — HEPARIN SOD (PORK) LOCK FLUSH 100 UNIT/ML IV SOLN
500.0000 [IU] | Freq: Once | INTRAVENOUS | Status: AC
  Administered 2023-05-30: 500 [IU] via INTRAVENOUS
  Filled 2023-05-30: qty 5

## 2023-05-30 MED ORDER — ONDANSETRON HCL 8 MG PO TABS
4.0000 mg | ORAL_TABLET | Freq: Once | ORAL | Status: AC
  Administered 2023-05-30: 4 mg via ORAL
  Filled 2023-05-30: qty 0.5

## 2023-05-30 MED ORDER — ACETAMINOPHEN 325 MG PO TABS: 650.0000 mg | ORAL_TABLET | Freq: Once | ORAL | Status: DC

## 2023-05-30 MED ORDER — DIPHENHYDRAMINE HCL 25 MG PO CAPS
25.0000 mg | ORAL_CAPSULE | Freq: Once | ORAL | Status: AC
  Administered 2023-05-30: 25 mg via ORAL
  Filled 2023-05-30: qty 1

## 2023-05-30 NOTE — Progress Notes (Signed)
Pharmacist Chemotherapy Monitoring - Initial Assessment    Anticipated start date: 03/06/23   The following has been reviewed per standard work regarding the patient's treatment regimen: The patient's diagnosis, treatment plan and drug doses, and organ/hematologic function Lab orders and baseline tests specific to treatment regimen  The treatment plan start date, drug sequencing, and pre-medications Prior authorization status  Patient's documented medication list, including drug-drug interaction screen and prescriptions for anti-emetics and supportive care specific to the treatment regimen The drug concentrations, fluid compatibility, administration routes, and timing of the medications to be used The patient's access for treatment and lifetime cumulative dose history, if applicable  The patient's medication allergies and previous infusion related reactions, if applicable   Changes made to treatment plan:  N/A  Follow up needed:  N/A   Ebony Hail, Pharm.D., CPP 05/30/2023@10 :36 PM

## 2023-05-31 ENCOUNTER — Encounter: Payer: Self-pay | Admitting: Gastroenterology

## 2023-05-31 LAB — TYPE AND SCREEN
ABO/RH(D): B POS
Antibody Screen: NEGATIVE
Unit division: 0

## 2023-05-31 LAB — BPAM RBC
Blood Product Expiration Date: 202409282359
ISSUE DATE / TIME: 202409171119
Unit Type and Rh: 7300

## 2023-06-01 ENCOUNTER — Other Ambulatory Visit: Payer: Self-pay | Admitting: Student

## 2023-06-01 ENCOUNTER — Ambulatory Visit
Admission: RE | Admit: 2023-06-01 | Discharge: 2023-06-01 | Disposition: A | Discharge status: Home / Self Care | Payer: BC Managed Care – PPO | Attending: Gastroenterology | Admitting: Gastroenterology

## 2023-06-01 ENCOUNTER — Encounter: Payer: Self-pay | Admitting: Gastroenterology

## 2023-06-01 ENCOUNTER — Other Ambulatory Visit: Payer: Self-pay

## 2023-06-01 ENCOUNTER — Encounter
Admission: RE | Disposition: A | Discharge status: Home / Self Care | Payer: Self-pay | Source: Home / Self Care | Attending: Gastroenterology

## 2023-06-01 ENCOUNTER — Ambulatory Visit: Payer: BC Managed Care – PPO

## 2023-06-01 ENCOUNTER — Ambulatory Visit: Payer: BC Managed Care – PPO | Admitting: General Practice

## 2023-06-01 ENCOUNTER — Encounter: Payer: Self-pay | Admitting: Oncology

## 2023-06-01 DIAGNOSIS — Z8249 Family history of ischemic heart disease and other diseases of the circulatory system: Secondary | ICD-10-CM | POA: Diagnosis not present

## 2023-06-01 DIAGNOSIS — Z01812 Encounter for preprocedural laboratory examination: Secondary | ICD-10-CM

## 2023-06-01 DIAGNOSIS — K922 Gastrointestinal hemorrhage, unspecified: Secondary | ICD-10-CM | POA: Diagnosis not present

## 2023-06-01 DIAGNOSIS — I10 Essential (primary) hypertension: Secondary | ICD-10-CM | POA: Insufficient documentation

## 2023-06-01 DIAGNOSIS — K921 Melena: Secondary | ICD-10-CM | POA: Insufficient documentation

## 2023-06-01 DIAGNOSIS — D490 Neoplasm of unspecified behavior of digestive system: Secondary | ICD-10-CM | POA: Diagnosis not present

## 2023-06-01 DIAGNOSIS — Z79899 Other long term (current) drug therapy: Secondary | ICD-10-CM | POA: Diagnosis not present

## 2023-06-01 DIAGNOSIS — D378 Neoplasm of uncertain behavior of other specified digestive organs: Secondary | ICD-10-CM | POA: Diagnosis not present

## 2023-06-01 DIAGNOSIS — K219 Gastro-esophageal reflux disease without esophagitis: Secondary | ICD-10-CM | POA: Diagnosis not present

## 2023-06-01 DIAGNOSIS — D649 Anemia, unspecified: Secondary | ICD-10-CM

## 2023-06-01 DIAGNOSIS — I4891 Unspecified atrial fibrillation: Secondary | ICD-10-CM | POA: Insufficient documentation

## 2023-06-01 HISTORY — DX: Unspecified osteoarthritis, unspecified site: M19.90

## 2023-06-01 HISTORY — PX: ESOPHAGOGASTRODUODENOSCOPY (EGD) WITH PROPOFOL: SHX5813

## 2023-06-01 HISTORY — PX: HEMOSTASIS CONTROL: SHX6838

## 2023-06-01 HISTORY — DX: Malignant (primary) neoplasm, unspecified: C80.1

## 2023-06-01 SURGERY — ESOPHAGOGASTRODUODENOSCOPY (EGD) WITH PROPOFOL
Anesthesia: General

## 2023-06-01 MED ORDER — SODIUM CHLORIDE 0.9 % IV SOLN: INTRAVENOUS | Status: DC

## 2023-06-01 MED ORDER — HEPARIN SOD (PORK) LOCK FLUSH 100 UNIT/ML IV SOLN
500.0000 [IU] | INTRAVENOUS | Status: AC | PRN
  Administered 2023-06-01: 500 [IU]

## 2023-06-01 MED ORDER — HEPARIN SOD (PORK) LOCK FLUSH 100 UNIT/ML IV SOLN
INTRAVENOUS | Status: AC
  Filled 2023-06-01: qty 5

## 2023-06-01 MED ORDER — SODIUM CHLORIDE 0.9% FLUSH
10.0000 mL | Freq: Once | INTRAVENOUS | Status: AC
  Administered 2023-06-01: 10 mL via INTRAVENOUS

## 2023-06-01 MED ORDER — LIDOCAINE HCL (CARDIAC) PF 100 MG/5ML IV SOSY
PREFILLED_SYRINGE | INTRAVENOUS | Status: DC | PRN
  Administered 2023-06-01: 80 mg via INTRAVENOUS

## 2023-06-01 MED ORDER — PROPOFOL 1000 MG/100ML IV EMUL
INTRAVENOUS | Status: AC
  Filled 2023-06-01: qty 100

## 2023-06-01 MED ORDER — PROPOFOL 500 MG/50ML IV EMUL
INTRAVENOUS | Status: DC | PRN
  Administered 2023-06-01: 100 ug/kg/min via INTRAVENOUS

## 2023-06-01 MED ORDER — PROPOFOL 10 MG/ML IV BOLUS
INTRAVENOUS | Status: DC | PRN
  Administered 2023-06-01: 80 mg via INTRAVENOUS

## 2023-06-01 MED ORDER — PROPOFOL 10 MG/ML IV BOLUS
INTRAVENOUS | Status: AC
  Filled 2023-06-01: qty 20

## 2023-06-01 NOTE — Op Note (Signed)
[facility] Gastroenterology Patient Name: Ariana Wilson Procedure Date: 06/01/2023 9:55 AM MRN: [id number] Account #: [id number] Date of Birth: 1961/08/29 Admit Type: Outpatient Age: 62 Room: [facility] ENDO ROOM 3 Gender: Female Note Status: Finalized Instrument Name: Upper Endoscope [id number] Procedure:             Upper GI endoscopy Indications:           Melena Providers:             [name] MD, MD Referring MD:          Danella Penton, MD (Referring MD) Medicines:             Monitored Anesthesia Care Complications:         No immediate complications. Procedure:             Pre-Anesthesia Assessment:                        - Prior to the procedure, a History and Physical was                         performed, and patient medications, allergies and                         sensitivities were reviewed. The patient's tolerance                         of previous anesthesia was reviewed.                        - The risks and benefits of the procedure and the                         sedation options and risks were discussed with the                         patient. All questions were answered and informed                         consent was obtained.                        - ASA Grade Assessment: II - A patient with mild                         systemic disease.                        After obtaining informed consent, the endoscope was                         passed under direct vision. Throughout the procedure,                         the patient's blood pressure, pulse, and oxygen                         saturations were monitored continuously. The Endoscope                         was introduced  through the mouth, and advanced to the                         third part of duodenum. The upper GI endoscopy was                         accomplished with ease. The patient tolerated the                         procedure well. Findings:      The examined duodenum was  normal.      The stomach was normal.      A medium-sized, fungating mass with no bleeding and stigmata of recent       bleeding was found at the gastroesophageal junction. The mass was       non-obstructing and not circumferential. For hemostasis, hemostatic       spray was deployed. Multiple sprays were applied. There was no bleeding       at the end of the procedure.      The cardia and gastric fundus were normal on retroflexion. Impression:            - Normal examined duodenum.                        - Normal stomach.                        - Esophageal tumor was found at the gastroesophageal                         junction. Hemostatic spray applied.                        - No specimens collected. Recommendation:        - Discharge patient to home (with escort).                        - Resume previous diet.                        - Continue present medications. Procedure Code(s):     --- Professional ---                        [phone], Esophagogastroduodenoscopy, flexible,                         transoral; with control of bleeding, any method Diagnosis Code(s):     --- Professional ---                        D49.0, Neoplasm of unspecified behavior of digestive                         system                        K92.1, Melena (includes Hematochezia) CPT copyright 2022 American Medical Association. All rights reserved. The codes documented in this report are preliminary and upon coder review may  be revised to meet current compliance requirements. [name], MD [name] MD, MD 06/01/2023 10:26:12 AM  This report has been signed electronically. Number of Addenda: 0 Note Initiated On: 06/01/2023 9:55 AM Estimated Blood Loss:  Estimated blood loss: none.      [facility]

## 2023-06-01 NOTE — H&P (Signed)
Ariana Mood, MD [address], [place], [place], [address] [address], [address], [place], [place], [address] Phone: [phone]  Fax: [phone]  Primary Care Physician:  Danella Penton, MD   Pre-Procedure History & Physical: HPI:  Ariana Wilson is a 62 y.o. female is here for an endoscopy    Past Medical History:  Diagnosis Date   Anemia    Arthritis    Atrial fibrillation (HCC)    B12 deficiency    Cancer [facility])    Dysrhythmia/history A. Fib    Hyperlipidemia    Hypothyroidism    Menopause    Osteoarthritis    Palpitations    Hx of, 25 years ago   Vertigo     Past Surgical History:  Procedure Laterality Date   BILATERAL SALPINGOOPHORECTOMY  2008   DILATION AND CURETTAGE OF UTERUS  08/24/2006   ESOPHAGOGASTRODUODENOSCOPY (EGD) WITH PROPOFOL N/A 03/01/2022   Procedure: ESOPHAGOGASTRODUODENOSCOPY (EGD) WITH PROPOFOL;  Surgeon: Regis Bill, MD;  Location: [facility] ENDOSCOPY;  Service: Endoscopy;  Laterality: N/A;   ESOPHAGOGASTRODUODENOSCOPY (EGD) WITH PROPOFOL N/A 05/04/2022   Procedure: ESOPHAGOGASTRODUODENOSCOPY (EGD) WITH PROPOFOL;  Surgeon: Ariana Mood, MD;  Location: [facility] ENDOSCOPY;  Service: Gastroenterology;  Laterality: N/A;   incision tendon sheath for trigger finger Right    ring finger   LAPAROSCOPIC SUPRACERVICAL HYSTERECTOMY  2008   Dr. Arvil Chaco; with BSO   NOSE SURGERY  03/2008   PORTA CATH INSERTION N/A 03/21/2022   Procedure: PORTA CATH INSERTION;  Surgeon: Annice Needy, MD;  Location: [facility] INVASIVE CV LAB;  Service: Cardiovascular;  Laterality: N/A;    Prior to Admission medications   Medication Sig Start Date End Date Taking? Authorizing Provider  flecainide (TAMBOCOR) 50 MG tablet Take 1 tablet by mouth 2 (two) times daily. 07/09/20  Yes [provider]  midodrine (PROAMATINE) 10 MG tablet Take by mouth. 05/26/22  Yes [provider]  pantoprazole (PROTONIX) 40 MG tablet Take 40 mg by mouth daily.   Yes  [provider]  Calcium Carbonate-Vitamin D 600-200 MG-UNIT TABS Take 1 tablet by mouth daily.      [provider]  cetirizine (ZYRTEC) 10 MG tablet Take 10 mg by mouth daily.    [provider]  Cholecalciferol 25 MCG (1000 UT) tablet Take 1,000 Units by mouth daily.    [provider]  dexamethasone (DECADRON) 4 MG tablet Take 2 tablets (8 mg total) by mouth daily. Start the day after chemotherapy for 2 days. Take with food. Patient not taking: Reported on 05/29/2023 05/22/23   Creig Hines, MD  famotidine (PEPCID) 40 MG tablet Take 1 tablet by mouth at bedtime. 10/21/22 10/21/23  [provider]  fentaNYL (DURAGESIC) 12 MCG/HR Place 1 patch onto the skin every 3 (three) days. 05/22/23   Creig Hines, MD  lidocaine-prilocaine (EMLA) cream Apply to affected area once 05/22/23   Creig Hines, MD  loperamide (IMODIUM) 2 MG capsule Take 2 tabs by mouth with first loose stool, then 1 tab with each additional loose stool as needed. Do not exceed 8 tabs in a 24-hour period Patient not taking: Reported on 05/29/2023 05/22/23   Creig Hines, MD  mometasone (NASONEX) 50 MCG/ACT nasal spray Place 2 sprays into the nose daily.    [provider]  ondansetron (ZOFRAN) 8 MG tablet Take 1 tablet (8 mg total) by mouth every 8 (eight) hours as needed for nausea, vomiting or refractory nausea /  vomiting. Start on the third day after chemotherapy. 05/22/23   Creig Hines, MD  oxyCODONE (OXY IR/ROXICODONE) 5 MG immediate release tablet Take 1 tablet (5 mg total) by mouth every 4 (four) hours as needed for severe pain. 05/19/23   Creig Hines, MD  polyethylene glycol (MIRALAX / GLYCOLAX) 17 g packet Take by mouth.    [provider]  prochlorperazine (COMPAZINE) 10 MG tablet Take 1 tablet (10 mg total) by mouth every 6 (six) hours as needed for nausea or vomiting. 05/22/23   Creig Hines, MD  psyllium (METAMUCIL) 58.6 % powder Take 1 packet by mouth 3 (three)  times daily.    [provider]  Saccharomyces boulardii (PROBIOTIC) 250 MG CAPS Take 1 capsule by mouth daily.    [provider]  simvastatin (ZOCOR) 20 MG tablet Take 1 tablet by mouth at bedtime. 08/10/22   [provider]  Zinc Citrate-Phytase (ZYTAZE) 25-500 MG CAPS Take 1 Dose by mouth daily. Patient not taking: Reported on 01/16/2023    [provider]    Allergies as of 05/30/2023 - Review Complete 05/22/2023  Allergen Reaction Noted   Sulfa antibiotics Hives 12/06/2018   Sulfonamide derivatives Swelling 02/18/2010    Family History  Problem Relation Age of Onset   Hyperlipidemia Mother    Hypertension Mother    Stroke Mother    Heart disease Father    Hyperlipidemia Father    Atrial fibrillation Father    Melanoma Father    Arrhythmia Sister        Atrial fibrillation   Heart disease Sister    Atrial fibrillation Sister    Heart attack Brother    Breast cancer Neg Hx     Social History   Socioeconomic History   Marital status: Married    Spouse name: Not on file   Number of children: Not on file   Years of education: Not on file   Highest education level: Not on file  Occupational History   Occupation: Full time  Tobacco Use   Smoking status: Never   Smokeless tobacco: Never  Vaping Use   Vaping status: Never Used  Substance and Sexual Activity   Alcohol use: Never   Drug use: Never   Sexual activity: Not Currently    Birth control/protection: Surgical    Comment: Hysterectomy  Other Topics Concern   Not on file  Social History Narrative   Married with 2 children   Gets regular exercise   Social Determinants of Health   Financial Resource Strain: Low Risk  (04/18/2023)   Received from [facility]   Overall Financial Resource Strain (CARDIA)    Difficulty of Paying Living Expenses: Not hard at all  Food Insecurity: No Food Insecurity (04/18/2023)   Received from [facility]    Hunger Vital Sign    Worried About Running Out of Food in the Last Year: Never true    Ran Out of Food in the Last Year: Never true  Transportation Needs: No Transportation Needs (04/18/2023)   Received from [facility] - Transportation    In the past 12 months, has lack of transportation kept you from medical appointments or from getting medications?: No    Lack of Transportation (Non-Medical): No  Physical Activity: Inactive (04/20/2022)   Exercise Vital Sign    Days of Exercise per Week: 0 days    Minutes of Exercise per Session: 0 min  Stress:  No Stress Concern Present (04/20/2022)   [employer] of Occupational Health - Occupational Stress Questionnaire    Feeling of Stress : Only a little  Social Connections: Socially Integrated (04/20/2022)   Social Connection and Isolation Panel [NHANES]    Frequency of Communication with Friends and Family: More than three times a week    Frequency of Social Gatherings with Friends and Family: More than three times a week    Attends Religious Services: More than 4 times per year    Active Member of [employer] or Organizations: Yes    Attends Engineer, structural: More than 4 times per year    Marital Status: Married  Catering manager Violence: Not on file    Review of Systems: See HPI, otherwise negative ROS  Physical Exam: BP 132/71   Pulse 78   Temp (!) 97.4 F (36.3 C) (Temporal)   Resp 18   Ht 5' (1.524 m)   Wt 67.6 kg   SpO2 100%   BMI 29.10 kg/m  General:   Alert,  pleasant and cooperative in NAD Head:  Normocephalic and atraumatic. Neck:  Supple; no masses or thyromegaly. Lungs:  Clear throughout to auscultation, normal respiratory effort.    Heart:  +S1, +S2, Regular rate and rhythm, No edema. Abdomen:  Soft, nontender and nondistended. Normal bowel sounds, without guarding, and without rebound.   Neurologic:  Alert and  oriented x4;  grossly normal neurologically.  Impression/Plan: Ariana Wilson is here for an endoscopy  to be performed for  evaluation of gi bleed    Risks, benefits, limitations, and alternatives regarding endoscopy have been reviewed with the patient.  Questions have been answered.  All parties agreeable.   Ariana Mood, MD  06/01/2023, 9:38 AM

## 2023-06-01 NOTE — Progress Notes (Signed)
Patient for CT guided Abd Mass Biopsy on Friday 06/02/2023, I called and LVM for the patient on the phone and gave pre-procedure instructions. VM made pt aware to be here at 8:30a, last dose of ASA 81mg  was Sat 05/27/23, NPO after MN prior to procedure as well as driver post procedure/recovery/discharge.  Called 05/26/23 and 06/01/23

## 2023-06-01 NOTE — Anesthesia Preprocedure Evaluation (Signed)
Anesthesia Evaluation  Patient identified by MRN, date of birth, ID band Patient awake    Reviewed: Allergy & Precautions, NPO status , Patient's Chart, lab work & pertinent test results  History of Anesthesia Complications Negative for: history of anesthetic complications  Airway Mallampati: II  TM Distance: >3 FB Neck ROM: Full    Dental  (+) Teeth Intact   Pulmonary neg pulmonary ROS, neg sleep apnea, neg COPD, Patient abstained from smoking.Not current smoker   Pulmonary exam normal breath sounds clear to auscultation       Cardiovascular Exercise Tolerance: Good hypertension, Pt. on medications (-) CAD and (-) Past MI Normal cardiovascular exam+ dysrhythmias Atrial Fibrillation  Rhythm:Regular Rate:Normal  Unremarkable TTE 2020   Neuro/Psych negative neurological ROS  negative psych ROS   GI/Hepatic Neg liver ROS,GERD  Controlled and Medicated,,  Endo/Other  neg diabetesHypothyroidism    Renal/GU      Musculoskeletal   Abdominal   Peds  Hematology  (+) Blood dyscrasia, anemia   Anesthesia Other Findings Past Medical History: No date: Anemia No date: Arthritis No date: Atrial fibrillation (HCC) No date: B12 deficiency No date: Dysrhythmia No date: Hyperlipidemia No date: Hypertension No date: Hypothyroidism No date: Menopause No date: Palpitations     Comment:  Hx of, 25 years ago No date: Vertigo  Past Surgical History: 2008: BILATERAL SALPINGOOPHORECTOMY 08/24/2006: DILATION AND CURETTAGE OF UTERUS No date: incision tendon sheath for trigger finger; Right     Comment:  ring finger 2008: LAPAROSCOPIC SUPRACERVICAL HYSTERECTOMY     Comment:  Dr. Arvil Chaco; with BSO 03/2008: NOSE SURGERY  BMI    Body Mass Index: 29.49 kg/m      Reproductive/Obstetrics negative OB ROS                             Anesthesia Physical Anesthesia Plan  ASA: 3  Anesthesia Plan:  General   Post-op Pain Management: Minimal or no pain anticipated   Induction: Intravenous  PONV Risk Score and Plan: 3 and Propofol infusion, TIVA and Ondansetron  Airway Management Planned: Nasal Cannula  Additional Equipment: None  Intra-op Plan:   Post-operative Plan:   Informed Consent: I have reviewed the patients History and Physical, chart, labs and discussed the procedure including the risks, benefits and alternatives for the proposed anesthesia with the patient or authorized representative who has indicated his/her understanding and acceptance.     Dental advisory given  Plan Discussed with: CRNA and Surgeon  Anesthesia Plan Comments: (Discussed risks of anesthesia with patient, including possibility of difficulty with spontaneous ventilation under anesthesia necessitating airway intervention, PONV, and rare risks such as cardiac or respiratory or neurological events, and allergic reactions. Discussed the role of CRNA in patient's perioperative care. Patient understands.)       Anesthesia Quick Evaluation

## 2023-06-01 NOTE — Anesthesia Postprocedure Evaluation (Signed)
Anesthesia Post Note  Patient: Ariana Wilson  Procedure(s) Performed: ESOPHAGOGASTRODUODENOSCOPY (EGD) WITH PROPOFOL  Patient location during evaluation: Endoscopy Anesthesia Type: General Level of consciousness: awake and alert Pain management: pain level controlled Vital Signs Assessment: post-procedure vital signs reviewed and stable Respiratory status: spontaneous breathing, nonlabored ventilation, respiratory function stable and patient connected to nasal cannula oxygen Cardiovascular status: blood pressure returned to baseline and stable Postop Assessment: no apparent nausea or vomiting Anesthetic complications: no  No notable events documented.   Last Vitals:  Vitals:   06/01/23 1037 06/01/23 1047  BP: (!) 110/54 (!) 116/57  Pulse:    Resp:    Temp:    SpO2:      Last Pain:  Vitals:   06/01/23 1047  TempSrc:   PainSc: 0-No pain                 Stephanie Coup

## 2023-06-01 NOTE — Transfer of Care (Signed)
Immediate Anesthesia Transfer of Care Note  Patient: Ariana Wilson  Procedure(s) Performed: ESOPHAGOGASTRODUODENOSCOPY (EGD) WITH PROPOFOL  Patient Location: PACU  Anesthesia Type:General  Level of Consciousness: awake and alert   Airway & Oxygen Therapy: Patient Spontanous Breathing  Post-op Assessment: Report given to RN and Post -op Vital signs reviewed and stable  Post vital signs: Reviewed and stable  Last Vitals:  Vitals Value Taken Time  BP 118/49 06/01/23 1027  Temp 97.4   Pulse 88 06/01/23 1027  Resp 30 06/01/23 1027  SpO2 94 % 06/01/23 1027  Vitals shown include unfiled device data.  Last Pain:  Vitals:   06/01/23 0902  TempSrc: Temporal         Complications: No notable events documented.

## 2023-06-02 ENCOUNTER — Telehealth: Payer: Self-pay | Admitting: *Deleted

## 2023-06-02 ENCOUNTER — Other Ambulatory Visit: Payer: Self-pay

## 2023-06-02 ENCOUNTER — Ambulatory Visit
Admission: RE | Admit: 2023-06-02 | Discharge: 2023-06-02 | Disposition: A | Discharge status: Home / Self Care | Payer: BC Managed Care – PPO | Source: Ambulatory Visit | Attending: Oncology | Admitting: Oncology

## 2023-06-02 DIAGNOSIS — C159 Malignant neoplasm of esophagus, unspecified: Secondary | ICD-10-CM | POA: Insufficient documentation

## 2023-06-02 DIAGNOSIS — R590 Localized enlarged lymph nodes: Secondary | ICD-10-CM | POA: Diagnosis present

## 2023-06-02 DIAGNOSIS — I4891 Unspecified atrial fibrillation: Secondary | ICD-10-CM | POA: Diagnosis not present

## 2023-06-02 DIAGNOSIS — E785 Hyperlipidemia, unspecified: Secondary | ICD-10-CM | POA: Diagnosis not present

## 2023-06-02 DIAGNOSIS — C772 Secondary and unspecified malignant neoplasm of intra-abdominal lymph nodes: Secondary | ICD-10-CM | POA: Diagnosis not present

## 2023-06-02 DIAGNOSIS — Z01812 Encounter for preprocedural laboratory examination: Secondary | ICD-10-CM

## 2023-06-02 DIAGNOSIS — Z7189 Other specified counseling: Secondary | ICD-10-CM

## 2023-06-02 LAB — CBC
HCT: 28.8 % — ABNORMAL LOW (ref 36.0–46.0)
Hemoglobin: 9.1 g/dL — ABNORMAL LOW (ref 12.0–15.0)
MCH: 31.5 pg (ref 26.0–34.0)
MCHC: 31.6 g/dL (ref 30.0–36.0)
MCV: 99.7 fL (ref 80.0–100.0)
Platelets: 295 10*3/uL (ref 150–400)
RBC: 2.89 MIL/uL — ABNORMAL LOW (ref 3.87–5.11)
RDW: 18.3 % — ABNORMAL HIGH (ref 11.5–15.5)
WBC: 7.6 10*3/uL (ref 4.0–10.5)
nRBC: 0 % (ref 0.0–0.2)

## 2023-06-02 LAB — PROTIME-INR
INR: 1 (ref 0.8–1.2)
Prothrombin Time: 13.1 seconds (ref 11.4–15.2)

## 2023-06-02 MED ORDER — FENTANYL CITRATE (PF) 100 MCG/2ML IJ SOLN
INTRAMUSCULAR | Status: AC
  Filled 2023-06-02: qty 2

## 2023-06-02 MED ORDER — MIDAZOLAM HCL 2 MG/2ML IJ SOLN
INTRAMUSCULAR | Status: AC | PRN
Start: 2023-06-02 — End: 2023-06-02
  Administered 2023-06-02: 1 mg via INTRAVENOUS
  Administered 2023-06-02: .5 mg via INTRAVENOUS

## 2023-06-02 MED ORDER — MIDAZOLAM HCL 2 MG/2ML IJ SOLN
INTRAMUSCULAR | Status: AC
  Filled 2023-06-02: qty 2

## 2023-06-02 MED ORDER — FENTANYL CITRATE (PF) 100 MCG/2ML IJ SOLN
INTRAMUSCULAR | Status: AC | PRN
Start: 2023-06-02 — End: 2023-06-02
  Administered 2023-06-02: 25 ug via INTRAVENOUS
  Administered 2023-06-02: 50 ug via INTRAVENOUS

## 2023-06-02 MED ORDER — SODIUM CHLORIDE 0.9 % IV SOLN: INTRAVENOUS | Status: DC

## 2023-06-02 NOTE — Telephone Encounter (Signed)
Pain medication the duragesic is not giving her the best for her. She spoke to [facility] earlier in the last week and told her that we can change it every 2 days than 3. The pt. Wanted to see if that is ok. I called Smith Robert she is ok with that and I told her that her patches will run out early so let us know when she gets low and then we will change to 48 hours rather that the 72hour. She will try it .

## 2023-06-02 NOTE — H&P (Signed)
Chief Complaint: Patient was seen in consultation today for left para-aortic lymph node biopsy  Referring Physician(s): Rao,Archana C  Supervising Physician: Pernell Dupre  Patient Status: [facility] - Out-pt  History of Present Illness: Ariana Wilson is a 62 y.o. female with PMH significant for anemia, atrial fibrillation, cancer of the esophagus, hyperlipidemia, and vertigo being seen today for left para-aortic lymph node biopsy. 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 Patient has had interval development of worsening lymphadenopathy in the upper mediastinum. Dr Smith Robert has requested lymph node biopsy for NGS testing.   Past Medical History:  Diagnosis Date   Anemia    Arthritis    Atrial fibrillation (HCC)    B12 deficiency    Cancer (HCC)    Dysrhythmia/history A. Fib    Hyperlipidemia    Hypothyroidism    Menopause    Osteoarthritis    Palpitations    Hx of, 25 years ago   Vertigo     Past Surgical History:  Procedure Laterality Date   BILATERAL SALPINGOOPHORECTOMY  2008   DILATION AND CURETTAGE OF UTERUS  08/24/2006   ESOPHAGOGASTRODUODENOSCOPY (EGD) WITH PROPOFOL N/A 03/01/2022   Procedure: ESOPHAGOGASTRODUODENOSCOPY (EGD) WITH PROPOFOL;  Surgeon: Regis Bill, MD;  Location: [facility] ENDOSCOPY;  Service: Endoscopy;  Laterality: N/A;   ESOPHAGOGASTRODUODENOSCOPY (EGD) WITH PROPOFOL N/A 05/04/2022   Procedure: ESOPHAGOGASTRODUODENOSCOPY (EGD) WITH PROPOFOL;  Surgeon: [name], MD;  Location: [facility] ENDOSCOPY;  Service: Gastroenterology;  Laterality: N/A;   incision tendon sheath for trigger finger Right    ring finger   LAPAROSCOPIC SUPRACERVICAL HYSTERECTOMY  2008   Dr. Arvil Chaco; with BSO   NOSE SURGERY  03/2008   PORTA CATH INSERTION N/A 03/21/2022   Procedure: PORTA CATH INSERTION;  Surgeon: Annice Needy, MD;  Location: [facility] INVASIVE CV LAB;  Service: Cardiovascular;   Laterality: N/A;    Allergies: Sulfa antibiotics and Sulfonamide derivatives  Medications: Prior to Admission medications   Medication Sig Start Date End Date Taking? Authorizing Provider  Calcium Carbonate-Vitamin D 600-200 MG-UNIT TABS Take 1 tablet by mouth daily.      [provider]  cetirizine (ZYRTEC) 10 MG tablet Take 10 mg by mouth daily.    [provider]  Cholecalciferol 25 MCG (1000 UT) tablet Take 1,000 Units by mouth daily.    [provider]  dexamethasone (DECADRON) 4 MG tablet Take 2 tablets (8 mg total) by mouth daily. Start the day after chemotherapy for 2 days. Take with food. Patient not taking: Reported on 05/29/2023 05/22/23   Creig Hines, MD  famotidine (PEPCID) 40 MG tablet Take 1 tablet by mouth at bedtime. 10/21/22 10/21/23  [provider]  fentaNYL (DURAGESIC) 12 MCG/HR Place 1 patch onto the skin every 3 (three) days. 05/22/23   Creig Hines, MD  flecainide (TAMBOCOR) 50 MG tablet Take 1 tablet by mouth 2 (two) times daily. 07/09/20   [provider]  lidocaine-prilocaine (EMLA) cream Apply to affected area once 05/22/23   Creig Hines, MD  loperamide (IMODIUM) 2 MG capsule Take 2 tabs by mouth with first loose stool, then 1 tab with each additional loose stool as needed. Do not exceed 8 tabs in a 24-hour period Patient not taking: Reported on 05/29/2023 05/22/23   Creig Hines, MD  midodrine (PROAMATINE) 10 MG tablet Take by mouth. 05/26/22   [provider]  mometasone (NASONEX) 50 MCG/ACT nasal spray Place 2 sprays into the nose daily.    [provider]  ondansetron (ZOFRAN) 8 MG tablet Take 1 tablet (8 mg total) by mouth every 8 (eight) hours as needed for nausea, vomiting or refractory nausea / vomiting. Start on the third day after chemotherapy. 05/22/23   Creig Hines, MD  oxyCODONE (OXY IR/ROXICODONE) 5 MG immediate release tablet Take 1 tablet (5 mg total) by mouth every 4 (four) hours as needed  for severe pain. 05/19/23   Creig Hines, MD  pantoprazole (PROTONIX) 40 MG tablet Take 40 mg by mouth daily.    [provider]  polyethylene glycol (MIRALAX / GLYCOLAX) 17 g packet Take by mouth.    [provider]  prochlorperazine (COMPAZINE) 10 MG tablet Take 1 tablet (10 mg total) by mouth every 6 (six) hours as needed for nausea or vomiting. 05/22/23   Creig Hines, MD  psyllium (METAMUCIL) 58.6 % powder Take 1 packet by mouth 3 (three) times daily.    [provider]  Saccharomyces boulardii (PROBIOTIC) 250 MG CAPS Take 1 capsule by mouth daily.    [provider]  simvastatin (ZOCOR) 20 MG tablet Take 1 tablet by mouth at bedtime. 08/10/22   [provider]  Zinc Citrate-Phytase (ZYTAZE) 25-500 MG CAPS Take 1 Dose by mouth daily. Patient not taking: Reported on 01/16/2023    [provider]     Family History  Problem Relation Age of Onset   Hyperlipidemia Mother    Hypertension Mother    Stroke Mother    Heart disease Father    Hyperlipidemia Father    Atrial fibrillation Father    Melanoma Father    Arrhythmia Sister        Atrial fibrillation   Heart disease Sister    Atrial fibrillation Sister    Heart attack Brother    Breast cancer Neg Hx     Social History   Socioeconomic History   Marital status: Married    Spouse name: Not on file   Number of children: Not on file   Years of education: Not on file   Highest education level: Not on file  Occupational History   Occupation: Full time  Tobacco Use   Smoking status: Never   Smokeless tobacco: Never  Vaping Use   Vaping status: Never Used  Substance and Sexual Activity   Alcohol use: Never   Drug use: Never   Sexual activity: Not Currently    Birth control/protection: Surgical    Comment: Hysterectomy  Other Topics Concern   Not on file  Social History Narrative   Married with 2 children   Gets regular exercise   Social Determinants of Health    Financial Resource Strain: Low Risk  (04/18/2023)   Received from [facility]   Overall Financial Resource Strain (CARDIA)    Difficulty of Paying Living Expenses: Not hard at all  Food Insecurity: No Food Insecurity (04/18/2023)   Received from [facility]   Hunger Vital Sign    Worried About Running Out of Food in the Last Year: Never true    Ran Out of Food in the Last Year: Never true  Transportation Needs: No Transportation Needs (04/18/2023)   Received from [facility] - Transportation    In the past 12 months, has lack of transportation kept you from medical appointments or from getting medications?: No  Lack of Transportation (Non-Medical): No  Physical Activity: Inactive (04/20/2022)   Exercise Vital Sign    Days of Exercise per Week: 0 days    Minutes of Exercise per Session: 0 min  Stress: No Stress Concern Present (04/20/2022)   [employer] of Occupational Health - Occupational Stress Questionnaire    Feeling of Stress : Only a little  Social Connections: Socially Integrated (04/20/2022)   Social Connection and Isolation Panel [NHANES]    Frequency of Communication with Friends and Family: More than three times a week    Frequency of Social Gatherings with Friends and Family: More than three times a week    Attends Religious Services: More than 4 times per year    Active Member of [employer] or Organizations: Yes    Attends Engineer, structural: More than 4 times per year    Marital Status: Married    Code Status: Full code  Review of Systems: A 12 point ROS discussed and pertinent positives are indicated in the HPI above.  All other systems are negative.  Review of Systems  Constitutional:  Negative for chills and fever.  Respiratory:  Negative for chest tightness and shortness of breath.   Cardiovascular:  Negative for chest pain and leg swelling.  Gastrointestinal:  Positive for abdominal pain.  Negative for diarrhea, nausea and vomiting.  Neurological:  Negative for dizziness and headaches.  Psychiatric/Behavioral:  Negative for confusion.     Vital Signs: BP 126/66   Pulse 79   Temp 98.1 F (36.7 C)   Resp 13   Ht 5' (1.524 m)   Wt 149 lb (67.6 kg)   SpO2 98%   BMI 29.10 kg/m      Physical Exam Vitals reviewed.  Constitutional:      General: She is not in acute distress. Cardiovascular:     Rate and Rhythm: Normal rate and regular rhythm.     Pulses: Normal pulses.     Heart sounds: Normal heart sounds.  Pulmonary:     Effort: Pulmonary effort is normal.     Breath sounds: Normal breath sounds.  Abdominal:     Palpations: Abdomen is soft.     Tenderness: There is no abdominal tenderness.  Musculoskeletal:     Right lower leg: No edema.     Left lower leg: No edema.  Skin:    General: Skin is warm and dry.  Neurological:     Mental Status: She is alert and oriented to person, place, and time.  Psychiatric:        Mood and Affect: Mood normal.        Behavior: Behavior normal.        Thought Content: Thought content normal.        Judgment: Judgment normal.     Imaging: No results found.  Labs:  CBC: Recent Labs    10/21/22 1052 01/16/23 0953 05/16/23 1040 05/29/23 1315  WBC 4.7 5.3 5.0 6.0  HGB 13.3 12.6 10.3* 7.3*  HCT 38.5 36.1 31.1* 23.2*  PLT 144* 167 233 265    COAGS: No results for input(s): "INR", "APTT" in the last 8760 hours.  BMP: Recent Labs    09/06/22 0822 10/21/22 1052 01/16/23 0953 05/16/23 1040  NA 139 138 135 133*  K 3.4* 3.6 3.7 3.9  CL 108 104 101 101  CO2 26 26 25 22   GLUCOSE 99 117* 97 91  BUN 11 12 16  25*  CALCIUM 9.1 8.9 9.1 8.8*  CREATININE  0.74 0.73 0.80 0.89  GFRNONAA >60 >60 >60 >60    LIVER FUNCTION TESTS: Recent Labs    09/06/22 0822 10/21/22 1052 01/16/23 0953 05/16/23 1040  BILITOT 1.0 1.0 1.3* 0.6  AST 37 32 24 14*  ALT 30 31 23 10   ALKPHOS 101 94 78 59  PROT 7.0 7.2 7.2 7.0   ALBUMIN 4.0 4.1 4.3 3.9    TUMOR MARKERS: No results for input(s): "AFPTM", "CEA", "CA199", "CHROMGRNA" in the last 8760 hours.  Assessment and Plan:  Swathi Socks is a 62 yo female being seen today for left para-aortic lymph node biopsy. Patient is followed by Dr Smith Robert from Oncology for her stage IV esophageal adenocarcinoma. Imaging reviewed and approved by Dr Deanne Coffer for image-guided para-aortic lymph node biopsy. Case reviewed with Dr Juliette Alcide this morning. Patient is NPO and presents in her usual state of health today. Case scheduled to proceed on 06/02/23 with Dr Juliette Alcide.  Risks and benefits of image-guided para-aortic lymph node biopsy was discussed with the patient and/or patient's family including, but not limited to bleeding, infection, damage to adjacent structures or low yield requiring additional tests.  All of the questions were answered and there is agreement to proceed.  Consent signed and in chart.   Thank you for this interesting consult.  I greatly enjoyed meeting JAYLANI ROUSSELL and look forward to participating in their care.  A copy of this report was sent to the requesting provider on this date.  Electronically Signed: Kennieth Francois, PA-C 06/02/2023, 9:16 AM   I spent a total of 15 Minutes   in face to face in clinical consultation, greater than 50% of which was counseling/coordinating care for para-aortic lymph node biopsy.

## 2023-06-02 NOTE — Procedures (Signed)
Interventional Radiology Procedure Note  Date of Procedure: 06/02/2023  Procedure: CT paraaortic LN core needle biposy   Findings:  1. CT core needle biopsy of left paraaortic LN 18ga x6 cores   Complications: No immediate complications noted.   Estimated Blood Loss: minimal  Follow-up and Recommendations: 1. Bedrest 1 hour    Olive Bass, MD  Vascular & Interventional Radiology  06/02/2023 9:57 AM

## 2023-06-05 ENCOUNTER — Telehealth: Payer: Self-pay

## 2023-06-05 LAB — SURGICAL PATHOLOGY

## 2023-06-05 MED FILL — Dexamethasone Sodium Phosphate Inj 100 MG/10ML: INTRAMUSCULAR | Qty: 1 | Status: AC

## 2023-06-05 NOTE — Telephone Encounter (Signed)
Request for [employer] CDx/RNA sent on specimen [id number] along with financial aide application.

## 2023-06-06 ENCOUNTER — Encounter: Payer: Self-pay | Admitting: Oncology

## 2023-06-06 ENCOUNTER — Inpatient Hospital Stay (HOSPITAL_BASED_OUTPATIENT_CLINIC_OR_DEPARTMENT_OTHER): Payer: BC Managed Care – PPO | Admitting: Oncology

## 2023-06-06 ENCOUNTER — Inpatient Hospital Stay: Payer: BC Managed Care – PPO

## 2023-06-06 VITALS — BP 103/49 | HR 79 | Temp 96.7°F | Resp 18 | Ht 60.0 in | Wt 152.0 lb

## 2023-06-06 DIAGNOSIS — K5903 Drug induced constipation: Secondary | ICD-10-CM

## 2023-06-06 DIAGNOSIS — T402X5A Adverse effect of other opioids, initial encounter: Secondary | ICD-10-CM

## 2023-06-06 DIAGNOSIS — G893 Neoplasm related pain (acute) (chronic): Secondary | ICD-10-CM

## 2023-06-06 DIAGNOSIS — C155 Malignant neoplasm of lower third of esophagus: Secondary | ICD-10-CM

## 2023-06-06 DIAGNOSIS — Z5111 Encounter for antineoplastic chemotherapy: Secondary | ICD-10-CM

## 2023-06-06 DIAGNOSIS — C7A8 Other malignant neuroendocrine tumors: Secondary | ICD-10-CM

## 2023-06-06 DIAGNOSIS — D649 Anemia, unspecified: Secondary | ICD-10-CM

## 2023-06-06 DIAGNOSIS — Z1231 Encounter for screening mammogram for malignant neoplasm of breast: Secondary | ICD-10-CM

## 2023-06-06 LAB — CBC WITH DIFFERENTIAL (CANCER CENTER ONLY)
Abs Immature Granulocytes: 0.01 10*3/uL (ref 0.00–0.07)
Basophils Absolute: 0 10*3/uL (ref 0.0–0.1)
Basophils Relative: 0 %
Eosinophils Absolute: 0.2 10*3/uL (ref 0.0–0.5)
Eosinophils Relative: 4 %
HCT: 23.3 % — ABNORMAL LOW (ref 36.0–46.0)
Hemoglobin: 7.4 g/dL — ABNORMAL LOW (ref 12.0–15.0)
Immature Granulocytes: 0 %
Lymphocytes Relative: 17 %
Lymphs Abs: 0.9 10*3/uL (ref 0.7–4.0)
MCH: 31.9 pg (ref 26.0–34.0)
MCHC: 31.8 g/dL (ref 30.0–36.0)
MCV: 100.4 fL — ABNORMAL HIGH (ref 80.0–100.0)
Monocytes Absolute: 0.6 10*3/uL (ref 0.1–1.0)
Monocytes Relative: 11 %
Neutro Abs: 3.5 10*3/uL (ref 1.7–7.7)
Neutrophils Relative %: 68 %
Platelet Count: 279 10*3/uL (ref 150–400)
RBC: 2.32 MIL/uL — ABNORMAL LOW (ref 3.87–5.11)
RDW: 16.6 % — ABNORMAL HIGH (ref 11.5–15.5)
WBC Count: 5.3 10*3/uL (ref 4.0–10.5)
nRBC: 0 % (ref 0.0–0.2)

## 2023-06-06 LAB — CMP (CANCER CENTER ONLY)
ALT: 10 U/L (ref 0–44)
AST: 15 U/L (ref 15–41)
Albumin: 3.5 g/dL (ref 3.5–5.0)
Alkaline Phosphatase: 66 U/L (ref 38–126)
Anion gap: 8 (ref 5–15)
BUN: 21 mg/dL (ref 8–23)
CO2: 25 mmol/L (ref 22–32)
Calcium: 8.5 mg/dL — ABNORMAL LOW (ref 8.9–10.3)
Chloride: 100 mmol/L (ref 98–111)
Creatinine: 0.79 mg/dL (ref 0.44–1.00)
GFR, Estimated: >60 mL/min (ref >60)
Glucose, Bld: 100 mg/dL — ABNORMAL HIGH (ref 70–99)
Potassium: 3.7 mmol/L (ref 3.5–5.1)
Sodium: 133 mmol/L — ABNORMAL LOW (ref 135–145)
Total Bilirubin: 0.6 mg/dL (ref 0.3–1.2)
Total Protein: 6.6 g/dL (ref 6.5–8.1)

## 2023-06-06 MED ORDER — SODIUM CHLORIDE 0.9 % IV SOLN
400.0000 mg/m2 | Freq: Once | INTRAVENOUS | Status: AC
  Administered 2023-06-06: 692 mg via INTRAVENOUS
  Filled 2023-06-06: qty 34.6

## 2023-06-06 MED ORDER — FLUOROURACIL CHEMO INJECTION 2.5 GM/50ML
400.0000 mg/m2 | Freq: Once | INTRAVENOUS | Status: AC
  Administered 2023-06-06: 700 mg via INTRAVENOUS
  Filled 2023-06-06: qty 14

## 2023-06-06 MED ORDER — SODIUM CHLORIDE 0.9 % IV SOLN
2400.0000 mg/m2 | INTRAVENOUS | Status: DC
  Administered 2023-06-06: 4150 mg via INTRAVENOUS
  Filled 2023-06-06: qty 83

## 2023-06-06 MED ORDER — SODIUM CHLORIDE 0.9 % IV SOLN
10.0000 mg | Freq: Once | INTRAVENOUS | Status: AC
  Administered 2023-06-06: 10 mg via INTRAVENOUS
  Filled 2023-06-06: qty 1
  Filled 2023-06-06: qty 10

## 2023-06-06 MED ORDER — SODIUM CHLORIDE 0.9 % IV SOLN
150.0000 mg/m2 | Freq: Once | INTRAVENOUS | Status: AC
  Administered 2023-06-06: 260 mg via INTRAVENOUS
  Filled 2023-06-06: qty 13

## 2023-06-06 MED ORDER — ATROPINE SULFATE 1 MG/ML IV SOLN: 0.5000 mg | Freq: Once | INTRAVENOUS | Status: DC | PRN

## 2023-06-06 MED ORDER — SODIUM CHLORIDE 0.9 % IV SOLN
Freq: Once | INTRAVENOUS | Status: AC
  Filled 2023-06-06: qty 250

## 2023-06-06 MED ORDER — PALONOSETRON HCL INJECTION 0.25 MG/5ML
0.2500 mg | Freq: Once | INTRAVENOUS | Status: AC
  Administered 2023-06-06: 0.25 mg via INTRAVENOUS
  Filled 2023-06-06: qty 5

## 2023-06-06 NOTE — Patient Instructions (Signed)
[place] [facility]  Discharge Instructions: Thank you for choosing [facility] to provide your oncology and hematology care.  If you have a lab appointment with the Cancer Center, please go directly to the Cancer Center and check in at the registration area.  Wear comfortable clothing and clothing appropriate for easy access to any Portacath or PICC line.   We strive to give you quality time with your provider. You may need to reschedule your appointment if you arrive late (15 or more minutes).  Arriving late affects you and other patients whose appointments are after yours.  Also, if you miss three or more appointments without notifying the office, you may be dismissed from the clinic at the provider's discretion.      For prescription refill requests, have your pharmacy contact our office and allow 72 hours for refills to be completed.    Today you received the following chemotherapy and/or immunotherapy agents Irinotecan, leucovorin, adurcil      To help prevent nausea and vomiting after your treatment, we encourage you to take your nausea medication as directed.  BELOW ARE SYMPTOMS THAT SHOULD BE REPORTED IMMEDIATELY: *FEVER GREATER THAN 100.4 F (38 C) OR HIGHER *CHILLS OR SWEATING *NAUSEA AND VOMITING THAT IS NOT CONTROLLED WITH YOUR NAUSEA MEDICATION *UNUSUAL SHORTNESS OF BREATH *UNUSUAL BRUISING OR BLEEDING *URINARY PROBLEMS (pain or burning when urinating, or frequent urination) *BOWEL PROBLEMS (unusual diarrhea, constipation, pain near the anus) TENDERNESS IN MOUTH AND THROAT WITH OR WITHOUT PRESENCE OF ULCERS (sore throat, sores in mouth, or a toothache) UNUSUAL RASH, SWELLING OR PAIN  UNUSUAL VAGINAL DISCHARGE OR ITCHING   Items with * indicate a potential emergency and should be followed up as soon as possible or go to the Emergency Department if any problems should occur.  Please show the CHEMOTHERAPY ALERT CARD or IMMUNOTHERAPY  ALERT CARD at check-in to the Emergency Department and triage nurse.  Should you have questions after your visit or need to cancel or reschedule your appointment, please contact [place] [facility]  [phone] and follow the prompts.  Office hours are 8:00 a.m. to 4:30 p.m. Monday - Friday. Please note that voicemails left after 4:00 p.m. may not be returned until the following business day.  We are closed weekends and major holidays. You have access to a nurse at all times for urgent questions. Please call the main number to the clinic [phone] and follow the prompts.  For any non-urgent questions, you may also contact your provider using MyChart. We now offer e-Visits for anyone 39 and older to request care online for non-urgent symptoms. For details visit [email or web address].   Also download the MyChart app! Go to the app store, search "MyChart", open the app, select [place], and log in with your MyChart username and password.

## 2023-06-06 NOTE — Progress Notes (Signed)
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 Mia Creek on 03/01/2022 EGD showed a medium-sized ulcerating mass with no bleeding or stigmata of recent bleeding at the GE junction..  Mass was partially obstructing.  There is also mention of another ulcerated noncircumferential mass with oozing bleeding found at the GE junction with extension into the cardia.  Both these masses were biopsied.  Pathology was pending at the time of my visit with the patient and was reported to be a poorly differentiated carcinoma.  Patient is currently able to swallow soft foods without much difficulty.  However more solid foods occasionally feel stuck.    PET was denied by insurance.  CT chest abdomen and pelvis with contrast showed asymmetric distal esophageal wall thickening with thickening extending around the lesser curvature of the stomach through the gastric cardia and proximal fundus.  Paratracheal/paraesophageal, gastrohepatic and PET retroperitoneal adenopathy.  Left periaortic lymph node measuring 17 mm at the level of left renal hilum.   Final pathology showed there were 2 specimens collected.  Specimen a was poorly differentiated carcinoma with ulceration and necrosis.v verys weakly positive for synaptophysin 50% of the cells.  Cells negative for CK7 and CK23 for the BF1 CDX2 100 Melan-A HMB45  and CD20.  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  Pathology was also looked at but was signed out as poorly differentiated adenocarcinoma at [facility].  Patient did not have a good response to cisplatin etoposide Tecentriq regimen.  She was therefore switched to FOLFOX chemotherapy.  CPS score was 1 and therefore Keytruda was excluded.  HER2 negative.   Patient initially received 4 cycles of cisplatin and etoposide chemotherapy up until August 2023.  Scans did not show any significant response and but she was switched to FOLFOX chemotherapy starting September 2023.  She received 9 cycles of treatment up until December 2023.   She had CT scan as well as PET scan with the PET scan being done in January 2024 at [facility].PET CT scan showed marked interval improvement in the distal esophageal hypermetabolic activity compatible with treatment effect.  Resolution of hypermetabolic thoracoabdominal adenopathy and no new sites of metastatic disease.  Plan was to hold off on any further chemotherapy.  She did see Dr. Ewing Schlein at [facility] as well and underwent repeat EGD with biopsies which were negative for malignancy.  She is off treatment since December 2023   Patient underwent repeat staging scans on 05/18/2023 at [facility].  CT abdomen showed markedly worsening multistation adenopathy in the upper abdomen and retroperitoneum with masslike nodal conglomerate involving gastrohepatic and porta hepatis spaces.  New left adrenal metastases.  CT chest showed increased size of multiple mediastinal lymph nodes concerning for progressive metastatic disease.  Repeat  biopsy has been performed for NGS testing which is currently pending  Plan is to proceed with second line FOLFIRI chemotherapy.  Immunotherapy was not  added as CPS score was less than 10  Interval history-patient reports ongoing fatigue.  Pain is currently well-controlled with fentanyl and as needed oxycodone but she does report on and off constipation.  Last bowel movement was 2 days ago.  ECOG PS- 1 Pain scale- 0   Review of systems- Review of Systems  Constitutional:  Positive for malaise/fatigue. Negative for chills, fever and weight loss.  HENT:  Negative for congestion, ear discharge and nosebleeds.   Eyes:  Negative for blurred vision.  Respiratory:  Negative for cough, hemoptysis, sputum production, shortness of breath and wheezing.   Cardiovascular:  Negative for chest pain, palpitations, orthopnea and claudication.  Gastrointestinal:  Negative for abdominal pain, blood in stool, constipation, diarrhea, heartburn, melena, nausea and vomiting.  Genitourinary:  Negative for dysuria, flank pain, frequency, hematuria and urgency.  Musculoskeletal:  Positive for back pain. Negative for joint pain and myalgias.  Skin:  Negative for rash.  Neurological:  Negative for dizziness, tingling, focal weakness, seizures, weakness and headaches.  Endo/Heme/Allergies:  Does not bruise/bleed easily.  Psychiatric/Behavioral:  Negative for depression and suicidal ideas. The patient does not have insomnia.       Allergies  Allergen Reactions   Sulfa Antibiotics Hives    Says her tongue swelled a bit   Sulfonamide Derivatives Swelling    Tongue swelling     Past Medical History:  Diagnosis Date   Anemia    Arthritis    Atrial fibrillation (HCC)    B12 deficiency    Cancer (HCC)    Dysrhythmia/history A. Fib    Hyperlipidemia    Hypothyroidism    Menopause    Osteoarthritis    Palpitations    Hx of, 25 years ago   Vertigo      Past Surgical History:  Procedure Laterality Date   BILATERAL SALPINGOOPHORECTOMY  2008   DILATION AND CURETTAGE OF UTERUS  08/24/2006   ESOPHAGOGASTRODUODENOSCOPY (EGD) WITH PROPOFOL N/A 03/01/2022    Procedure: ESOPHAGOGASTRODUODENOSCOPY (EGD) WITH PROPOFOL;  Surgeon: Regis Bill, MD;  Location: [facility] ENDOSCOPY;  Service: Endoscopy;  Laterality: N/A;   ESOPHAGOGASTRODUODENOSCOPY (EGD) WITH PROPOFOL N/A 05/04/2022   Procedure: ESOPHAGOGASTRODUODENOSCOPY (EGD) WITH PROPOFOL;  Surgeon: [name], MD;  Location: [facility] ENDOSCOPY;  Service: Gastroenterology;  Laterality: N/A;   ESOPHAGOGASTRODUODENOSCOPY (EGD) WITH PROPOFOL N/A 06/01/2023   Procedure: ESOPHAGOGASTRODUODENOSCOPY (EGD) WITH PROPOFOL;  Surgeon: [name], MD;  Location: [facility] ENDOSCOPY;  Service: Gastroenterology;  Laterality: N/A;   HEMOSTASIS CONTROL  06/01/2023   Procedure: HEMOSTASIS CONTROL;  Surgeon: [name], MD;  Location: [facility] ENDOSCOPY;  Service: Gastroenterology;;   incision tendon sheath for trigger finger Right    ring finger   LAPAROSCOPIC SUPRACERVICAL HYSTERECTOMY  2008   Dr. Arvil Chaco; with [facility]SO   NOSE SURGERY  03/2008   PORTA CATH INSERTION N/A 03/21/2022   Procedure: PORTA CATH INSERTION;  Surgeon: Annice Needy, MD;  Location: [facility] INVASIVE CV LAB;  Service: Cardiovascular;  Laterality: N/A;    Social History   Socioeconomic History   Marital status: Married    Spouse name: Not on file   Number of children: Not on file   Years of education: Not on file   Highest education level: Not on file  Occupational History   Occupation: Full time  Tobacco Use   Smoking status: Never   Smokeless tobacco: Never  Vaping Use   Vaping status: Never Used  Substance and Sexual Activity   Alcohol use: Never   Drug use: Never   Sexual activity: Not Currently    Birth control/protection: Surgical    Comment: Hysterectomy  Other Topics Concern   Not on file  Social History Narrative   Married with 2 children   Gets regular exercise   Social Determinants of Health   Financial Resource Strain: Low Risk  (04/18/2023)   [facility] from [facility]   Overall Financial Resource Strain (CARDIA)     Difficulty of Paying Living Expenses: Not hard at all  Food Insecurity: No Food Insecurity (04/18/2023)   Received from [facility]   Hunger Vital Sign    Worried About Running Out of Food in the Last Year: Never true    Ran Out of Food in the Last Year: Never true  Transportation Needs: No Transportation Needs (04/18/2023)   Received from [facility] - Transportation    In the past 12 months, has lack of transportation kept you from medical appointments or from getting medications?: No    Lack of Transportation (Non-Medical): No  Physical Activity: Inactive (04/20/2022)   Exercise Vital Sign    Days of Exercise per Week: 0 days    Minutes of Exercise per Session: 0 min  Stress: No Stress Concern Present (04/20/2022)   [employer] of Occupational Health - Occupational Stress Questionnaire    Feeling of Stress : Only a little  Social Connections: Socially Integrated (04/20/2022)   Social Connection and Isolation Panel [NHANES]    Frequency of Communication with Friends and Family: More than three times a week    Frequency of Social Gatherings with Friends and Family: More than three times a week    Attends Religious Services: More than 4 times per year    Active Member of [employer] or Organizations: Yes    Attends Engineer, structural: More than 4 times per year    Marital Status: Married  Catering manager Violence: Not on file    Family History  Problem Relation Age of Onset   Hyperlipidemia Mother    Hypertension Mother    Stroke Mother    Heart disease Father    Hyperlipidemia Father    Atrial fibrillation Father    Melanoma Father    Arrhythmia Sister        Atrial fibrillation   Heart disease Sister    Atrial fibrillation Sister    Heart attack Brother    Breast cancer Neg Hx      Current Outpatient Medications:    Calcium Carbonate-Vitamin D 600-200 MG-UNIT TABS, Take 1 tablet by mouth daily.  , Disp: , Rfl:     cetirizine (ZYRTEC) 10 MG tablet, Take 10 mg by mouth daily., Disp: , Rfl:    Cholecalciferol 25 MCG (1000 UT) tablet, Take 1,000 Units by mouth daily., Disp: , Rfl:    famotidine (PEPCID) 40 MG tablet, Take 1 tablet by mouth at bedtime., Disp: , Rfl:    fentaNYL (DURAGESIC) 12 MCG/HR, Place 1 patch onto the skin every 3 (three) days., Disp: 10 patch, Rfl: 0   flecainide (TAMBOCOR) 50 MG tablet, Take 1 tablet by mouth 2 (two) times daily., Disp: , Rfl:    lidocaine-prilocaine (EMLA) cream, Apply to affected area once, Disp: 30 g, Rfl: 3   midodrine (PROAMATINE) 10 MG tablet, Take by mouth., Disp: , Rfl:    mometasone (  NASONEX) 50 MCG/ACT nasal spray, Place 2 sprays into the nose daily., Disp: , Rfl:    ondansetron (ZOFRAN) 8 MG tablet, Take 1 tablet (8 mg total) by mouth every 8 (eight) hours as needed for nausea, vomiting or refractory nausea / vomiting. Start on the third day after chemotherapy., Disp: 30 tablet, Rfl: 1   oxyCODONE (OXY IR/ROXICODONE) 5 MG immediate release tablet, Take 1 tablet (5 mg total) by mouth every 4 (four) hours as needed for severe pain., Disp: 60 tablet, Rfl: 0   pantoprazole (PROTONIX) 40 MG tablet, Take 40 mg by mouth daily., Disp: , Rfl:    polyethylene glycol (MIRALAX / GLYCOLAX) 17 g packet, Take by mouth., Disp: , Rfl:    prochlorperazine (COMPAZINE) 10 MG tablet, Take 1 tablet (10 mg total) by mouth every 6 (six) hours as needed for nausea or vomiting., Disp: 30 tablet, Rfl: 1   psyllium (METAMUCIL) 58.6 % powder, Take 1 packet by mouth 3 (three) times daily., Disp: , Rfl:    Saccharomyces boulardii (PROBIOTIC) 250 MG CAPS, Take 1 capsule by mouth daily., Disp: , Rfl:    simvastatin (ZOCOR) 20 MG tablet, Take 1 tablet by mouth at bedtime., Disp: , Rfl:    dexamethasone (DECADRON) 4 MG tablet, Take 2 tablets (8 mg total) by mouth daily. Start the day after chemotherapy for 2 days. Take with food. (Patient not taking: Reported on 05/29/2023), Disp: 8 tablet, Rfl: 5    loperamide (IMODIUM) 2 MG capsule, Take 2 tabs by mouth with first loose stool, then 1 tab with each additional loose stool as needed. Do not exceed 8 tabs in a 24-hour period (Patient not taking: Reported on 05/29/2023), Disp: 60 capsule, Rfl: 2   Zinc Citrate-Phytase (ZYTAZE) 25-500 MG CAPS, Take 1 Dose by mouth daily. (Patient not taking: Reported on 01/16/2023), Disp: , Rfl:  No current facility-administered medications for this visit.  Facility-Administered Medications Ordered in Other Visits:    atropine injection 0.5 mg, 0.5 mg, Intravenous, Once PRN, Creig Hines, MD   fluorouracil (ADRUCIL) 4,150 mg in sodium chloride 0.9 % 67 mL chemo infusion, 2,400 mg/m2 (Order-Specific), Intravenous, 1 day or 1 dose, Creig Hines, MD   fluorouracil (ADRUCIL) chemo injection 700 mg, 400 mg/m2 (Order-Specific), Intravenous, Once, Creig Hines, MD   heparin lock flush 100 UNIT/ML injection, , , ,    irinotecan (CAMPTOSAR) 260 mg in sodium chloride 0.9 % 500 mL chemo infusion, 150 mg/m2 (Order-Specific), Intravenous, Once, Creig Hines, MD, Last Rate: 342 mL/hr at 06/06/23 1250, 260 mg at 06/06/23 1250   leucovorin 692 mg in sodium chloride 0.9 % 250 mL infusion, 400 mg/m2 (Order-Specific), Intravenous, Once, Creig Hines, MD, Last Rate: 190 mL/hr at 06/06/23 1251, 692 mg at 06/06/23 1251  Physical exam:  Vitals:   06/06/23 1054  BP: (!) 103/49  Pulse: 79  Resp: 18  Temp: (!) 96.7 F (35.9 C)  TempSrc: Tympanic  SpO2: 100%  Weight: 152 lb (68.9 kg)  Height: 5' (1.524 m)   Physical Exam Cardiovascular:     Rate and Rhythm: Normal rate and regular rhythm.     Heart sounds: Normal heart sounds.  Pulmonary:     Effort: Pulmonary effort is normal.     Breath sounds: Normal breath sounds.  Abdominal:     General: Bowel sounds are normal.     Palpations: Abdomen is soft.  Skin:    General: Skin is warm and dry.  Neurological:  Mental Status: She is alert and oriented to person,  place, and time.         Latest Ref Rng & Units 06/06/2023   10:08 AM  CMP  Glucose 70 - 99 mg/dL 366   BUN 8 - 23 mg/dL 21   Creatinine 4.40 - 1.00 mg/dL 3.47   Sodium 425 - 956 mmol/L 133   Potassium 3.5 - 5.1 mmol/L 3.7   Chloride 98 - 111 mmol/L 100   CO2 22 - 32 mmol/L 25   Calcium 8.9 - 10.3 mg/dL 8.5   Total Protein 6.5 - 8.1 g/dL 6.6   Total Bilirubin 0.3 - 1.2 mg/dL 0.6   Alkaline Phos 38 - 126 U/L 66   AST 15 - 41 U/L 15   ALT 0 - 44 U/L 10       Latest Ref Rng & Units 06/06/2023   10:08 AM  CBC  WBC 4.0 - 10.5 K/uL 5.3   Hemoglobin 12.0 - 15.0 g/dL 7.4   Hematocrit 38.7 - 46.0 % 23.3   Platelets 150 - 400 K/uL 279     No images are attached to the encounter.  CT ABDOMINAL MASS BIOPSY  Result Date: 06/02/2023 INDICATION: Primary malignant neuroendocrine tumor of esophagus, metastatic lymphadenopathy EXAM: CT-guided core needle biopsy of retroperitoneal lymphadenopathy MEDICATIONS: None. ANESTHESIA/SEDATION: Moderate (conscious) sedation was employed during this procedure. A total of Versed 1.5 mg and Fentanyl 75 mcg was administered intravenously. Moderate Sedation Time: 12 minutes. The patient's level of consciousness and vital signs were monitored continuously by radiology nursing throughout the procedure under my direct supervision. FLUOROSCOPY TIME:  N/a COMPLICATIONS: None immediate. PROCEDURE: Informed written consent was obtained from the patient after a thorough discussion of the procedural risks, benefits and alternatives. All questions were addressed. Maximal Sterile Barrier Technique was utilized including caps, mask, sterile gowns, sterile gloves, sterile drape, hand hygiene and skin antiseptic. A timeout was performed prior to the initiation of the procedure. The patient was placed prone on the exam table. Limited CT of the abdomen and pelvis was performed for planning purposes. This demonstrated retroperitoneal lymphadenopathy, with a attention turned to left  para-aortic conglomerate lymph nodes. Note was made of the position of the left renal artery. Skin entry site was marked, and the overlying skin was prepped and draped in the standard sterile fashion. Local analgesia was obtained with 1% lidocaine. Using intermittent CT fluoroscopy, a 17 gauge introducer needle was advanced towards the identified lesion. Subsequently, core needle biopsy was performed using an 18 gauge core biopsy device x6 total passes. Specimens were submitted in formalin to pathology for further handling. Limited postprocedure imaging demonstrated no complicating feature. The patient tolerated the procedure well, and was transferred to recovery in stable condition. IMPRESSION: Successful CT-guided core needle biopsy of left retroperitoneal/para-aortic lymphadenopathy. Electronically Signed   By: Olive Bass M.D.   On: 06/02/2023 10:46     Assessment and plan- Patient is a 61 y.o. female  with history of stage IV adenocarcinoma of the GE junction with retroperitoneal lymph node involvement s/p 9 cycles of palliative FOLFOX chemotherapy.  She was being monitored off treatment since December 2023.  Scans in September 2024 showed disease progression.  She is here for on treatment assessment prior to cycle 1 of FOLFIRI chemotherapy  Patient underwent EGD last week given worsening anemia and symptoms of melena.  She was found to have a partially obstructive fungating mass at the GE junction which was not bleeding with no stigmata of  bleeding.  She underwent Hemospray as a palliative procedure.  Despite that hemoglobin is down to 7.4 today.  We will plan to give her 1 unit of PRBC transfusion today and check her PRBC next week.  There is a continued downward trend in her hemoglobin we will consider giving her palliative radiation to the mass  Counts otherwise okay to proceed with cycle 1 of FOLFIRI chemotherapy today.  Pump disconnect on day 3.  I will see her back in 2 weeks for cycle 2.  If  she has significant neutropenia with chemotherapy we will consider giving her growth factor support as needed.  Discussed risks and benefits of chemotherapy including all but not limited to nausea vomiting low blood counts, risk of infections and hospitalization as well as diarrhea associated with chemotherapy.  Patient understands and agrees to proceed as planned.  Opioid-induced constipation: I have asked her to take MiraLAX and senna but be watchful for any symptoms of diarrhea that can be associated with irinotecan.  Neoplasm related pain: Continue fentanyl and as needed oxycodone   Visit Diagnosis 1. 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 Encounter for antineoplastic chemotherapy   3. Normocytic anemia   4. Neoplasm related pain   5. Therapeutic opioid induced constipation      Dr. Owens Shark, MD, MPH [facility] at [facility] [phone] 06/06/2023 1:51 PM

## 2023-06-07 ENCOUNTER — Other Ambulatory Visit: Payer: Self-pay

## 2023-06-07 ENCOUNTER — Encounter: Payer: Self-pay | Admitting: *Deleted

## 2023-06-07 ENCOUNTER — Other Ambulatory Visit: Payer: Self-pay | Admitting: *Deleted

## 2023-06-07 ENCOUNTER — Telehealth: Payer: Self-pay | Admitting: *Deleted

## 2023-06-07 ENCOUNTER — Other Ambulatory Visit: Payer: Self-pay | Admitting: Oncology

## 2023-06-07 DIAGNOSIS — I10 Essential (primary) hypertension: Secondary | ICD-10-CM | POA: Diagnosis not present

## 2023-06-07 DIAGNOSIS — D649 Anemia, unspecified: Secondary | ICD-10-CM

## 2023-06-07 DIAGNOSIS — R531 Weakness: Secondary | ICD-10-CM | POA: Diagnosis not present

## 2023-06-07 DIAGNOSIS — R112 Nausea with vomiting, unspecified: Secondary | ICD-10-CM | POA: Insufficient documentation

## 2023-06-07 LAB — CBC WITH DIFFERENTIAL/PLATELET
Abs Immature Granulocytes: 0.03 10*3/uL (ref 0.00–0.07)
Basophils Absolute: 0 10*3/uL (ref 0.0–0.1)
Basophils Relative: 0 %
Eosinophils Absolute: 0 10*3/uL (ref 0.0–0.5)
Eosinophils Relative: 0 %
HCT: 23.5 % — ABNORMAL LOW (ref 36.0–46.0)
Hemoglobin: 7.7 g/dL — ABNORMAL LOW (ref 12.0–15.0)
Immature Granulocytes: 0 %
Lymphocytes Relative: 10 %
Lymphs Abs: 0.9 10*3/uL (ref 0.7–4.0)
MCH: 31.6 pg (ref 26.0–34.0)
MCHC: 32.8 g/dL (ref 30.0–36.0)
MCV: 96.3 fL (ref 80.0–100.0)
Monocytes Absolute: 0.6 10*3/uL (ref 0.1–1.0)
Monocytes Relative: 7 %
Neutro Abs: 7.7 10*3/uL (ref 1.7–7.7)
Neutrophils Relative %: 83 %
Platelets: 273 10*3/uL (ref 150–400)
RBC: 2.44 MIL/uL — ABNORMAL LOW (ref 3.87–5.11)
RDW: 16.3 % — ABNORMAL HIGH (ref 11.5–15.5)
WBC: 9.3 10*3/uL (ref 4.0–10.5)
nRBC: 0 % (ref 0.0–0.2)

## 2023-06-07 LAB — COMPREHENSIVE METABOLIC PANEL
ALT: 10 U/L (ref 0–44)
AST: 16 U/L (ref 15–41)
Albumin: 3.5 g/dL (ref 3.5–5.0)
Alkaline Phosphatase: 67 U/L (ref 38–126)
Anion gap: 8 (ref 5–15)
BUN: 21 mg/dL (ref 8–23)
CO2: 23 mmol/L (ref 22–32)
Calcium: 8.8 mg/dL — ABNORMAL LOW (ref 8.9–10.3)
Chloride: 102 mmol/L (ref 98–111)
Creatinine, Ser: 0.83 mg/dL (ref 0.44–1.00)
GFR, Estimated: >60 mL/min (ref >60)
Glucose, Bld: 113 mg/dL — ABNORMAL HIGH (ref 70–99)
Potassium: 3.9 mmol/L (ref 3.5–5.1)
Sodium: 133 mmol/L — ABNORMAL LOW (ref 135–145)
Total Bilirubin: 0.8 mg/dL (ref 0.3–1.2)
Total Protein: 7.1 g/dL (ref 6.5–8.1)

## 2023-06-07 LAB — URINALYSIS, ROUTINE W REFLEX MICROSCOPIC
Bilirubin Urine: NEGATIVE
Glucose, UA: NEGATIVE mg/dL
Hgb urine dipstick: NEGATIVE
Ketones, ur: 5 mg/dL — AB
Leukocytes,Ua: NEGATIVE
Nitrite: NEGATIVE
Protein, ur: NEGATIVE mg/dL
Specific Gravity, Urine: 1.009 (ref 1.005–1.030)
pH: 9 — ABNORMAL HIGH (ref 5.0–8.0)

## 2023-06-07 LAB — TROPONIN I (HIGH SENSITIVITY): Troponin I (High Sensitivity): 3 ng/L (ref <18)

## 2023-06-07 LAB — PREPARE RBC (CROSSMATCH)

## 2023-06-07 LAB — LIPASE, BLOOD: Lipase: 193 U/L — ABNORMAL HIGH (ref 11–51)

## 2023-06-07 MED ORDER — FENTANYL 25 MCG/HR TD PT72
1.0000 | MEDICATED_PATCH | TRANSDERMAL | 0 refills | Status: DC
Start: 2023-06-07 — End: 2023-06-30

## 2023-06-07 NOTE — Telephone Encounter (Signed)
Patient called asking to have her Fentanyl increased to 25 mcg as the 12 mcg every 48 hours is not controlling it. She rates her pain at 8/10 last night and took an Oxycodone which makes her sick and it is rated at 2/10 this morning.Please advise

## 2023-06-07 NOTE — Telephone Encounter (Signed)
I spoke with patient.  She reports poorly controlled upper abdominal pain, similar to her chronic neoplasm related pain.  No significant changes but patient is unable to tolerate oxycodone as it contributes to her nausea.  She denies vomiting.  She is taking antiemetics regularly.  Denies adverse effects from fentanyl.  She did not notice much improvement with going to every 48 hour dosing.  Patient is interested in liberalizing dose of fentanyl.  Will send new prescription for 25 mcg patches every 72 hours.

## 2023-06-07 NOTE — ED Triage Notes (Signed)
Pt presents to ER via ems from home with c/o n/v and generalized weakness x1 week.  Pt is currently receiving first dose of chemo for tx of stage IV adenocarcinoma of the GE junction with retroperitoneal lymph node involvement.  Pt denies any fevers at home, and denies any contact with any sick persons.  Pt is otherwise A&O x4 and in NAD at this time.

## 2023-06-07 NOTE — Telephone Encounter (Signed)
Ariana Wilson (Key: [facility] PA Case ID #: [id number] Rx #: [id number]  PA submitted for fentanyl patches

## 2023-06-07 NOTE — Telephone Encounter (Signed)
Ariana Wilson (Key: [id number]) - [id number] fentaNYL 25MCG/HR 72 hr patches status: PA Response - Approved

## 2023-06-08 ENCOUNTER — Encounter: Payer: Self-pay | Admitting: Hospice and Palliative Medicine

## 2023-06-08 ENCOUNTER — Emergency Department
Admission: EM | Admit: 2023-06-08 | Discharge: 2023-06-08 | Disposition: A | Discharge status: Home / Self Care | Payer: BC Managed Care – PPO | Attending: Emergency Medicine | Admitting: Emergency Medicine

## 2023-06-08 ENCOUNTER — Inpatient Hospital Stay: Payer: BC Managed Care – PPO | Admitting: Hospice and Palliative Medicine

## 2023-06-08 ENCOUNTER — Ambulatory Visit: Payer: BC Managed Care – PPO

## 2023-06-08 ENCOUNTER — Inpatient Hospital Stay: Payer: BC Managed Care – PPO

## 2023-06-08 ENCOUNTER — Telehealth: Payer: Self-pay | Admitting: *Deleted

## 2023-06-08 ENCOUNTER — Other Ambulatory Visit: Payer: Self-pay | Admitting: *Deleted

## 2023-06-08 ENCOUNTER — Other Ambulatory Visit: Payer: Self-pay

## 2023-06-08 VITALS — BP 121/66 | HR 72 | Temp 97.4°F | Resp 18

## 2023-06-08 DIAGNOSIS — R531 Weakness: Secondary | ICD-10-CM

## 2023-06-08 DIAGNOSIS — C7A8 Other malignant neuroendocrine tumors: Secondary | ICD-10-CM

## 2023-06-08 DIAGNOSIS — R112 Nausea with vomiting, unspecified: Secondary | ICD-10-CM

## 2023-06-08 DIAGNOSIS — Z5111 Encounter for antineoplastic chemotherapy: Secondary | ICD-10-CM | POA: Diagnosis not present

## 2023-06-08 DIAGNOSIS — C155 Malignant neoplasm of lower third of esophagus: Secondary | ICD-10-CM

## 2023-06-08 DIAGNOSIS — D649 Anemia, unspecified: Secondary | ICD-10-CM | POA: Diagnosis not present

## 2023-06-08 LAB — BPAM RBC
Blood Product Expiration Date: 202410072359
Unit Type and Rh: 7300
Unit Type and Rh: 7300

## 2023-06-08 LAB — TYPE AND SCREEN
ABO/RH(D): B POS
Antibody Screen: NEGATIVE
Unit division: 0

## 2023-06-08 MED ORDER — FENTANYL CITRATE PF 50 MCG/ML IJ SOSY
50.0000 ug | PREFILLED_SYRINGE | INTRAMUSCULAR | Status: DC | PRN
  Administered 2023-06-08: 50 ug via INTRAVENOUS
  Filled 2023-06-08: qty 1

## 2023-06-08 MED ORDER — HEPARIN SOD (PORK) LOCK FLUSH 100 UNIT/ML IV SOLN
500.0000 [IU] | Freq: Once | INTRAVENOUS | Status: AC | PRN
  Administered 2023-06-08: 500 [IU]
  Filled 2023-06-08: qty 5

## 2023-06-08 MED ORDER — SODIUM CHLORIDE 0.9% IV SOLUTION
250.0000 mL | Freq: Once | INTRAVENOUS | Status: AC
  Administered 2023-06-08: 250 mL via INTRAVENOUS
  Filled 2023-06-08: qty 250

## 2023-06-08 MED ORDER — OLANZAPINE 10 MG PO TABS: 10.0000 mg | ORAL_TABLET | Freq: Every day | ORAL | 1 refills | Status: DC

## 2023-06-08 MED ORDER — SODIUM CHLORIDE 0.9% FLUSH
10.0000 mL | INTRAVENOUS | Status: DC | PRN
  Administered 2023-06-08: 10 mL
  Filled 2023-06-08: qty 10

## 2023-06-08 MED ORDER — LACTATED RINGERS IV BOLUS
1000.0000 mL | Freq: Once | INTRAVENOUS | Status: AC
  Administered 2023-06-08: 1000 mL via INTRAVENOUS

## 2023-06-08 MED ORDER — ONDANSETRON HCL 4 MG/2ML IJ SOLN
4.0000 mg | Freq: Once | INTRAMUSCULAR | Status: AC
  Administered 2023-06-08: 4 mg via INTRAVENOUS
  Filled 2023-06-08: qty 2

## 2023-06-08 NOTE — Telephone Encounter (Signed)
See mychart msg with correspondence. Pt will see josh at 130 today.

## 2023-06-08 NOTE — Patient Instructions (Signed)

## 2023-06-08 NOTE — Telephone Encounter (Signed)
I attempted to reach patient to follow-up on her symptoms and ER visit from last night. No answer vm left. We can evaluate her concerns when she comes in for d/c pump and blood transfusion today.

## 2023-06-08 NOTE — Progress Notes (Signed)
Symptom Management Clinic [facility] [phone] Fax:([phone]) [phone]  Patient Care Team: Danella Penton, MD as PCP - General (Internal Medicine) Benita Gutter, RN as Oncology Nurse Navigator Creig Hines, MD as Consulting Physician (Oncology)   NAME OF PATIENT: Ariana Wilson  [id number]  [birth year]   DATE OF VISIT: 06/08/23  REASON FOR CONSULT: MARLESHA SNIFFEN is a 62 y.o. 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  She was diagnosed with cancer in June 2023. .   INTERVAL HISTORY: Repeat staging CTs on 05/18/2023 at [facility] revealed disease progression.  Patient was rotated to FOLFIRI and she received cycle 1 on 06/06/2023.  Patient seen in the emergency department on 06/07/2023 with weakness and nausea.  She returns to clinic today for blood transfusion and pump removal.  Today, patient says that she is feeling slightly better than last night.  She does continue to endorse nausea without significant vomiting.  No fever or chills.  Abdominal pain is improved.  Denies any neurologic complaints. Reports fair appetite. Denies chest pain. Denies any constipation, or diarrhea. Denies urinary complaints. Patient offers no further specific complaints today.  PAST MEDICAL HISTORY: Past Medical History:  Diagnosis Date   Anemia    Arthritis    Atrial fibrillation (HCC)    B12 deficiency    Cancer (HCC)    Dysrhythmia/history A. Fib    Hyperlipidemia    Hypothyroidism    Menopause    Osteoarthritis    Palpitations    Hx of, 25 years ago   Vertigo     PAST SURGICAL HISTORY:  Past Surgical History:  Procedure Laterality Date   BILATERAL SALPINGOOPHORECTOMY  2008   DILATION AND CURETTAGE OF UTERUS  08/24/2006   ESOPHAGOGASTRODUODENOSCOPY (EGD) WITH PROPOFOL N/A 03/01/2022   Procedure: ESOPHAGOGASTRODUODENOSCOPY (EGD) WITH PROPOFOL;  Surgeon: Regis Bill, MD;   Location: [facility] ENDOSCOPY;  Service: Endoscopy;  Laterality: N/A;   ESOPHAGOGASTRODUODENOSCOPY (EGD) WITH PROPOFOL N/A 05/04/2022   Procedure: ESOPHAGOGASTRODUODENOSCOPY (EGD) WITH PROPOFOL;  Surgeon: [name], MD;  Location: [facility] ENDOSCOPY;  Service: Gastroenterology;  Laterality: N/A;   ESOPHAGOGASTRODUODENOSCOPY (EGD) WITH PROPOFOL N/A 06/01/2023   Procedure: ESOPHAGOGASTRODUODENOSCOPY (EGD) WITH PROPOFOL;  Surgeon: [name], MD;  Location: [facility] ENDOSCOPY;  Service: Gastroenterology;  Laterality: N/A;   HEMOSTASIS CONTROL  06/01/2023   Procedure: HEMOSTASIS CONTROL;  Surgeon: [name], MD;  Location: [facility] ENDOSCOPY;  Service: Gastroenterology;;   incision tendon sheath for trigger finger Right    ring finger   LAPAROSCOPIC SUPRACERVICAL HYSTERECTOMY  2008   Dr. Arvil Chaco; with BSO   NOSE SURGERY  03/2008   PORTA CATH INSERTION N/A 03/21/2022   Procedure: PORTA CATH INSERTION;  Surgeon: Annice Needy, MD;  Location: [facility] INVASIVE CV LAB;  Service: Cardiovascular;  Laterality: N/A;    HEMATOLOGY/ONCOLOGY HISTORY:  Oncology History  Primary malignant neuroendocrine tumor of esophagus (HCC)  03/20/2022 Initial Diagnosis   Primary malignant neuroendocrine neoplasm of esophagus (HCC)   03/20/2022 Cancer Staging   Staging form: Esophagus - Other Histologies, AJCC 8th Edition - Clinical stage from 03/20/2022: cTX, cN1, cM1, G3 - Signed by Creig Hines, MD on 03/20/2022 Histopathologic type: Large cell neuroendocrine carcinoma Histologic grading system: 3 grade system   03/30/2022 - 04/29/2022 Chemotherapy   Patient is on Treatment Plan : NEUROENDOCRINE GE junctionCisplatin/Tecentriq D1 + Etoposide D1-3 q21d     05/17/2022 - 09/08/2022 Chemotherapy   Patient is on  Treatment Plan : GASTROESOPHAGEAL FOLFOX q14d      06/06/2023 -  Chemotherapy   Patient is on Treatment Plan : GE junction adenocarcinoma FOLFIRI q14d     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  MEDICATIONS:  Current Outpatient Medications  Medication Sig Dispense Refill   Calcium Carbonate-Vitamin D 600-200 MG-UNIT TABS Take 1 tablet by mouth daily.       cetirizine (ZYRTEC) 10 MG tablet Take 10 mg by mouth daily.     Cholecalciferol 25 MCG (1000 UT) tablet Take 1,000 Units by mouth daily.     famotidine (PEPCID) 40 MG tablet Take 1 tablet by mouth at bedtime.     fentaNYL (DURAGESIC) 25 MCG/HR Place 1 patch onto the skin every 3 (three) days. 10 patch 0   flecainide (TAMBOCOR) 50 MG tablet Take 1 tablet by mouth 2 (two) times daily.     lidocaine-prilocaine (EMLA) cream Apply to affected area once 30 g 3   midodrine (PROAMATINE) 10 MG tablet Take by mouth.     mometasone (NASONEX) 50 MCG/ACT nasal spray Place 2 sprays into the nose daily.     ondansetron (ZOFRAN) 8 MG tablet Take 1 tablet (8 mg total) by mouth every 8 (eight) hours as needed for nausea, vomiting or refractory nausea / vomiting. Start on the third day after chemotherapy. 30 tablet 1   oxyCODONE (OXY IR/ROXICODONE) 5 MG immediate release tablet Take 1 tablet (5 mg total) by mouth every 4 (four) hours as needed for severe pain. 60 tablet 0   pantoprazole (PROTONIX) 40 MG tablet Take 40 mg by mouth daily.     polyethylene glycol (MIRALAX / GLYCOLAX) 17 g packet Take by mouth.     prochlorperazine (COMPAZINE) 10 MG tablet Take 1 tablet (10 mg total) by mouth every 6 (six) hours as needed for nausea or vomiting. 30 tablet 1   psyllium (METAMUCIL) 58.6 % powder Take 1 packet by mouth 3 (three) times daily.     Saccharomyces boulardii (PROBIOTIC) 250 MG CAPS Take 1 capsule by mouth daily.     simvastatin (ZOCOR) 20 MG tablet Take 1 tablet by mouth at bedtime.     Zinc  Citrate-Phytase (ZYTAZE) 25-500 MG CAPS Take 1 Dose by mouth daily.     dexamethasone (DECADRON) 4 MG tablet Take 2 tablets (8 mg total) by mouth daily. Start the day after chemotherapy for 2 days. Take with food. (Patient not taking: Reported on 05/29/2023) 8 tablet 5   loperamide (IMODIUM) 2 MG capsule Take 2 tabs by mouth with first loose stool, then 1 tab with each additional loose stool as needed. Do not exceed 8 tabs in a 24-hour period (Patient not taking: Reported on 05/29/2023) 60 capsule 2   No current facility-administered medications for this visit.   Facility-Administered Medications Ordered in Other Visits  Medication Dose Route Frequency Provider Last Rate Last Admin   0.9 %  sodium chloride infusion (Manually program via Guardrails IV Fluids)  250 mL Intravenous Once Creig Hines, MD       heparin lock flush 100 UNIT/ML injection            heparin lock flush 100 unit/mL  500 Units  Intracatheter Once PRN Creig Hines, MD       sodium chloride flush (NS) 0.9 % injection 10 mL  10 mL Intracatheter PRN Creig Hines, MD        VITAL SIGNS: There were no vitals taken for this visit. There were no vitals filed for this visit.  Estimated body mass index is 29.69 kg/m as calculated from the following:   Height as of 06/06/23: 5' (1.524 m).   Weight as of 06/06/23: 152 lb (68.9 kg).  LABS: CBC:    Component Value Date/Time   WBC 9.3 06/07/2023 2218   HGB 7.7 (L) 06/07/2023 2218   HGB 7.4 (L) 06/06/2023 1008   HCT 23.5 (L) 06/07/2023 2218   PLT 273 06/07/2023 2218   PLT 279 06/06/2023 1008   MCV 96.3 06/07/2023 2218   NEUTROABS 7.7 06/07/2023 2218   LYMPHSABS 0.9 06/07/2023 2218   MONOABS 0.6 06/07/2023 2218   EOSABS 0.0 06/07/2023 2218   BASOSABS 0.0 06/07/2023 2218   Comprehensive Metabolic Panel:    Component Value Date/Time   NA 133 (L) 06/07/2023 2218   K 3.9 06/07/2023 2218   CL 102 06/07/2023 2218   CO2 23 06/07/2023 2218   BUN 21 06/07/2023 2218    CREATININE 0.83 06/07/2023 2218   CREATININE 0.79 06/06/2023 1008   GLUCOSE 113 (H) 06/07/2023 2218   CALCIUM 8.8 (L) 06/07/2023 2218   AST 16 06/07/2023 2218   AST 15 06/06/2023 1008   ALT 10 06/07/2023 2218   ALT 10 06/06/2023 1008   ALKPHOS 67 06/07/2023 2218   BILITOT 0.8 06/07/2023 2218   BILITOT 0.6 06/06/2023 1008   PROT 7.1 06/07/2023 2218   PROT 6.6 03/06/2015 0910   ALBUMIN 3.5 06/07/2023 2218   ALBUMIN 4.6 03/06/2015 0910    RADIOGRAPHIC STUDIES: CT ABDOMINAL MASS BIOPSY  Result Date: 06/02/2023 INDICATION: Primary malignant neuroendocrine tumor of esophagus, metastatic lymphadenopathy EXAM: CT-guided core needle biopsy of retroperitoneal lymphadenopathy MEDICATIONS: None. ANESTHESIA/SEDATION: Moderate (conscious) sedation was employed during this procedure. A total of Versed 1.5 mg and Fentanyl 75 mcg was administered intravenously. Moderate Sedation Time: 12 minutes. The patient's level of consciousness and vital signs were monitored continuously by radiology nursing throughout the procedure under my direct supervision. FLUOROSCOPY TIME:  N/a COMPLICATIONS: None immediate. PROCEDURE: Informed written consent was obtained from the patient after a thorough discussion of the procedural risks, benefits and alternatives. All questions were addressed. Maximal Sterile Barrier Technique was utilized including caps, mask, sterile gowns, sterile gloves, sterile drape, hand hygiene and skin antiseptic. A timeout was performed prior to the initiation of the procedure. The patient was placed prone on the exam table. Limited CT of the abdomen and pelvis was performed for planning purposes. This demonstrated retroperitoneal lymphadenopathy, with a attention turned to left para-aortic conglomerate lymph nodes. Note was made of the position of the left renal artery. Skin entry site was marked, and the overlying skin was prepped and draped in the standard sterile fashion. Local analgesia was obtained  with 1% lidocaine. Using intermittent CT fluoroscopy, a 17 gauge introducer needle was advanced towards the identified lesion. Subsequently, core needle biopsy was performed using an 18 gauge core biopsy device x6 total passes. Specimens were submitted in formalin to pathology for further handling. Limited postprocedure imaging demonstrated no complicating feature. The patient tolerated the procedure well, and was transferred to recovery in stable condition. IMPRESSION: Successful CT-guided core needle biopsy of left retroperitoneal/para-aortic lymphadenopathy. Electronically Signed   By: Elijio Miles  El-Abd M.D.   On: 06/02/2023 10:46    PERFORMANCE STATUS (ECOG) : 1 - Symptomatic but completely ambulatory  Review of Systems Unless otherwise noted, a complete review of systems is negative.  Physical Exam General: NAD Cardiovascular: regular rate and rhythm Pulmonary: clear ant fields Abdomen: soft, nontender, + bowel sounds GU: no suprapubic tenderness Extremities: no edema, no joint deformities Skin: no rashes Neurological: Weakness but otherwise nonfocal  IMPRESSION/PLAN: Stage IV cancer GE junction -recent CT scan shows worsening mediastinal and retroperitoneal adenopathy.  Patient is status post cycle 1 FOLFIRI chemotherapy.  Symptomatic anemia -recent EGD with Hemospray.  Hemoglobin stable at 7.7.  No evidence of bleeding currently.  Proceed with transfusion.  Nausea -likely secondary to chemotherapy.  Recommended scheduled ondansetron.  Will rotate from prochlorperazine to olanzapine.  Continue supportive care.  Neoplasm related pain -improved after dose escalation of transdermal fentanyl.  Continue as needed oxycodone.  RTC next week for labs/supportive care/possible transfusion  Patient expressed understanding and was in agreement with this plan. She also understands that She can call clinic at any time with any questions, concerns, or complaints.   Thank you for allowing me to  participate in the care of this very pleasant patient.   Time Total: 20 minutes  Visit consisted of counseling and education dealing with the complex and emotionally intense issues of symptom management in the setting of serious illness.Greater than 50%  of this time was spent counseling and coordinating care related to the above assessment and plan.  Signed by: Laurette Schimke, PhD, NP-C

## 2023-06-08 NOTE — ED Provider Notes (Signed)
[facility] Provider Note    Event Date/Time   First MD Initiated Contact with Patient 06/08/23 [phone]     (approximate)   History   Chief Complaint Weakness   HPI  Ariana Wilson is a 62 y.o. female with past medical history of hypertension, hyperlipidemia, atrial fibrillation, and metastatic esophageal cancer who presents to the ED complaining of nausea and vomiting.  Patient reports that for the past week she has been feeling nauseous and has had multiple episodes of vomiting.  She was recently started back on chemotherapy for metastatic cancer at her GE junction, currently receiving chemotherapy infusion via port.  She has not had any fevers, reports chronic pain in her left upper quadrant is unchanged today.  She has not had any diarrhea, dysuria, or flank pain.  She received IV Zofran and fentanyl in triage with improvement in her symptoms.     Physical Exam   Triage Vital Signs: ED Triage Vitals  Encounter Vitals Group     BP 06/07/23 2211 (!) 125/109     Systolic BP Percentile --      Diastolic BP Percentile --      Pulse Rate 06/07/23 2211 73     Resp 06/07/23 2211 14     Temp 06/07/23 2211 98.5 F (36.9 C)     Temp Source 06/07/23 2211 Oral     SpO2 06/07/23 2211 100 %     Weight --      Height --      Head Circumference --      Peak Flow --      Pain Score 06/07/23 2216 5     Pain Loc --      Pain Education --      Exclude from Growth Chart --     Most recent vital signs: Vitals:   06/08/23 0130 06/08/23 0200  BP: 112/61 (!) 122/58  Pulse: 66 66  Resp:  17  Temp:    SpO2: 100% 93%    Constitutional: Alert and oriented. Eyes: Conjunctivae are normal. Head: Atraumatic. Nose: No congestion/rhinnorhea. Mouth/Throat: Mucous membranes are moist.  Cardiovascular: Normal rate, regular rhythm. Grossly normal heart sounds.  2+ radial pulses bilaterally. Respiratory: Normal respiratory effort.  No retractions. Lungs CTAB.  Right chest  wall Mediport currently accessed with chemotherapy infusion ongoing. Gastrointestinal: Soft and nontender. No distention. Musculoskeletal: No lower extremity tenderness nor edema.  Neurologic:  Normal speech and language. No gross focal neurologic deficits are appreciated.    ED Results / Procedures / Treatments   Labs (all labs ordered are listed, but only abnormal results are displayed) Labs Reviewed  CBC WITH DIFFERENTIAL/PLATELET - Abnormal; Notable for the following components:      Result Value   RBC 2.44 (*)    Hemoglobin 7.7 (*)    HCT 23.5 (*)    RDW 16.3 (*)    All other components within normal limits  COMPREHENSIVE METABOLIC PANEL - Abnormal; Notable for the following components:   Sodium 133 (*)    Glucose, Bld 113 (*)    Calcium 8.8 (*)    All other components within normal limits  LIPASE, BLOOD - Abnormal; Notable for the following components:   Lipase 193 (*)    All other components within normal limits  URINALYSIS, ROUTINE W REFLEX MICROSCOPIC - Abnormal; Notable for the following components:   Color, Urine STRAW (*)    APPearance CLEAR (*)    pH 9.0 (*)  Ketones, ur 5 (*)    All other components within normal limits  TROPONIN I (HIGH SENSITIVITY)     EKG  ED ECG REPORT I, Chesley Noon, the attending physician, personally viewed and interpreted this ECG.   Date: 06/08/2023  EKG Time: 22:17  Rate: 73  Rhythm: normal sinus rhythm  Axis: Normal  Intervals:none  ST&T Change: None  PROCEDURES:  Critical Care performed: No  Procedures   MEDICATIONS ORDERED IN ED: Medications  fentaNYL (SUBLIMAZE) injection 50 mcg (50 mcg Intravenous Given 06/08/23 0033)  ondansetron (ZOFRAN) injection 4 mg (4 mg Intravenous Given 06/08/23 0032)  lactated ringers bolus 1,000 mL (0 mLs Intravenous Stopped 06/08/23 0220)     IMPRESSION / MDM / ASSESSMENT AND PLAN / ED COURSE  I reviewed the triage vital signs and the nursing notes.                               62 y.o. female with past medical history of hypertension, hyperlipidemia, atrial fibrillation, and metastatic cancer of the GE junction who presents to the ED complaining of nausea, vomiting, and weakness.  Patient's presentation is most consistent with acute presentation with potential threat to life or bodily function.  Differential diagnosis includes, but is not limited to, esophageal obstruction, dehydration, electrolyte abnormality, AKI, gastroenteritis.  Patient well-appearing and in no acute distress, vital signs are unremarkable.  Her abdominal exam is soft with no focal tenderness, patient reports improvement in symptoms following IV fentanyl and Zofran.  Her abdominal pain is chronic and unchanged today, do not feel CT imaging is indicated.  Labs are reassuring with anemia stable compared to previous, no leukocytosis, electrolyte abnormality, or AKI.  LFTs and lipase are also unremarkable, urinalysis shows no signs of infection.  No findings concerning for infectious process, will give IV fluid bolus but patient declines additional pain or nausea medication.  She is currently tolerating water without difficulty.  Patient continues to feel well following IV fluid bolus, tolerating oral intake without difficulty.  She is requesting to be discharged home, states she has follow-up with her oncologist tomorrow.  She also states that she has plenty of nausea medication available at home.  She was counseled to return to the ED for new or worsening symptoms, patient agrees with plan.      FINAL CLINICAL IMPRESSION(S) / ED DIAGNOSES   Final diagnoses:  Generalized weakness  Nausea and vomiting, unspecified vomiting type     Rx / DC Orders   ED Discharge Orders     None        Note:  This document was prepared using Dragon voice recognition software and may include unintentional dictation errors.   Chesley Noon, MD 06/08/23 [phone]

## 2023-06-08 NOTE — Telephone Encounter (Signed)
Patient husband called reporting that patient had to go to ER last night and got IV fluids and pain medicine. He is asking if patient can see Dr Smith Robert or someone when she comes in today for blood transfusion and pump D/C to see about getting something so she can keep food and liquids to stay on her stomach. Please advise. Her appointment is at 130

## 2023-06-08 NOTE — Telephone Encounter (Signed)
Duplicate encounter

## 2023-06-13 ENCOUNTER — Encounter: Payer: Self-pay | Admitting: Oncology

## 2023-06-13 ENCOUNTER — Other Ambulatory Visit: Payer: Self-pay | Admitting: *Deleted

## 2023-06-13 ENCOUNTER — Inpatient Hospital Stay (HOSPITAL_BASED_OUTPATIENT_CLINIC_OR_DEPARTMENT_OTHER): Payer: BC Managed Care – PPO | Admitting: Nurse Practitioner

## 2023-06-13 ENCOUNTER — Inpatient Hospital Stay: Payer: BC Managed Care – PPO | Attending: Oncology

## 2023-06-13 ENCOUNTER — Other Ambulatory Visit: Payer: Self-pay

## 2023-06-13 ENCOUNTER — Inpatient Hospital Stay: Payer: BC Managed Care – PPO

## 2023-06-13 ENCOUNTER — Telehealth: Payer: Self-pay

## 2023-06-13 VITALS — BP 132/86 | HR 97 | Temp 98.1°F | Resp 18 | Ht 60.0 in | Wt 151.0 lb

## 2023-06-13 DIAGNOSIS — Z5111 Encounter for antineoplastic chemotherapy: Secondary | ICD-10-CM | POA: Diagnosis present

## 2023-06-13 DIAGNOSIS — D649 Anemia, unspecified: Secondary | ICD-10-CM

## 2023-06-13 DIAGNOSIS — K521 Toxic gastroenteritis and colitis: Secondary | ICD-10-CM | POA: Insufficient documentation

## 2023-06-13 DIAGNOSIS — T451X5A Adverse effect of antineoplastic and immunosuppressive drugs, initial encounter: Secondary | ICD-10-CM

## 2023-06-13 DIAGNOSIS — C7A8 Other malignant neuroendocrine tumors: Secondary | ICD-10-CM

## 2023-06-13 DIAGNOSIS — E871 Hypo-osmolality and hyponatremia: Secondary | ICD-10-CM | POA: Insufficient documentation

## 2023-06-13 DIAGNOSIS — R197 Diarrhea, unspecified: Secondary | ICD-10-CM

## 2023-06-13 DIAGNOSIS — R112 Nausea with vomiting, unspecified: Secondary | ICD-10-CM

## 2023-06-13 DIAGNOSIS — E860 Dehydration: Secondary | ICD-10-CM

## 2023-06-13 DIAGNOSIS — D6481 Anemia due to antineoplastic chemotherapy: Secondary | ICD-10-CM | POA: Insufficient documentation

## 2023-06-13 DIAGNOSIS — G893 Neoplasm related pain (acute) (chronic): Secondary | ICD-10-CM | POA: Insufficient documentation

## 2023-06-13 DIAGNOSIS — Z23 Encounter for immunization: Secondary | ICD-10-CM | POA: Insufficient documentation

## 2023-06-13 DIAGNOSIS — C7A1 Malignant poorly differentiated neuroendocrine tumors: Secondary | ICD-10-CM | POA: Insufficient documentation

## 2023-06-13 DIAGNOSIS — R7989 Other specified abnormal findings of blood chemistry: Secondary | ICD-10-CM | POA: Insufficient documentation

## 2023-06-13 DIAGNOSIS — C7B8 Other secondary neuroendocrine tumors: Secondary | ICD-10-CM | POA: Diagnosis not present

## 2023-06-13 LAB — SAMPLE TO BLOOD BANK

## 2023-06-13 LAB — CBC WITH DIFFERENTIAL (CANCER CENTER ONLY)
Abs Immature Granulocytes: 0.04 10*3/uL (ref 0.00–0.07)
Basophils Absolute: 0 10*3/uL (ref 0.0–0.1)
Basophils Relative: 1 %
Eosinophils Absolute: 0.2 10*3/uL (ref 0.0–0.5)
Eosinophils Relative: 6 %
HCT: 28.7 % — ABNORMAL LOW (ref 36.0–46.0)
Hemoglobin: 9.4 g/dL — ABNORMAL LOW (ref 12.0–15.0)
Immature Granulocytes: 2 %
Lymphocytes Relative: 57 %
Lymphs Abs: 1.6 10*3/uL (ref 0.7–4.0)
MCH: 30.9 pg (ref 26.0–34.0)
MCHC: 32.8 g/dL (ref 30.0–36.0)
MCV: 94.4 fL (ref 80.0–100.0)
Monocytes Absolute: 0.1 10*3/uL (ref 0.1–1.0)
Monocytes Relative: 3 %
Neutro Abs: 0.8 10*3/uL — ABNORMAL LOW (ref 1.7–7.7)
Neutrophils Relative %: 31 %
Platelet Count: 136 10*3/uL — ABNORMAL LOW (ref 150–400)
RBC: 3.04 MIL/uL — ABNORMAL LOW (ref 3.87–5.11)
RDW: 14.7 % (ref 11.5–15.5)
WBC Count: 2.7 10*3/uL — ABNORMAL LOW (ref 4.0–10.5)
nRBC: 0 % (ref 0.0–0.2)

## 2023-06-13 LAB — CMP (CANCER CENTER ONLY)
ALT: 9 U/L (ref 0–44)
AST: 14 U/L — ABNORMAL LOW (ref 15–41)
Albumin: 3.5 g/dL (ref 3.5–5.0)
Alkaline Phosphatase: 60 U/L (ref 38–126)
Anion gap: 10 (ref 5–15)
BUN: 17 mg/dL (ref 8–23)
CO2: 25 mmol/L (ref 22–32)
Calcium: 8.6 mg/dL — ABNORMAL LOW (ref 8.9–10.3)
Chloride: 97 mmol/L — ABNORMAL LOW (ref 98–111)
Creatinine: 0.77 mg/dL (ref 0.44–1.00)
GFR, Estimated: >60 mL/min (ref >60)
Glucose, Bld: 104 mg/dL — ABNORMAL HIGH (ref 70–99)
Potassium: 3.4 mmol/L — ABNORMAL LOW (ref 3.5–5.1)
Sodium: 132 mmol/L — ABNORMAL LOW (ref 135–145)
Total Bilirubin: 0.7 mg/dL (ref 0.3–1.2)
Total Protein: 6.7 g/dL (ref 6.5–8.1)

## 2023-06-13 LAB — MAGNESIUM: Magnesium: 2.2 mg/dL (ref 1.7–2.4)

## 2023-06-13 MED ORDER — SODIUM CHLORIDE 0.9 % IV SOLN
INTRAVENOUS | Status: DC
  Filled 2023-06-13 (×2): qty 250

## 2023-06-13 MED ORDER — SODIUM CHLORIDE 0.9 % IV SOLN
10.0000 mg | Freq: Once | INTRAVENOUS | Status: AC
  Administered 2023-06-13: 10 mg via INTRAVENOUS
  Filled 2023-06-13: qty 10

## 2023-06-13 MED ORDER — PROMETHAZINE HCL 25 MG PO TABS: 25.0000 mg | ORAL_TABLET | Freq: Three times a day (TID) | ORAL | 0 refills | Status: DC | PRN

## 2023-06-13 MED ORDER — MORPHINE SULFATE (PF) 2 MG/ML IV SOLN
2.0000 mg | Freq: Once | INTRAVENOUS | Status: AC
  Administered 2023-06-13: 2 mg via INTRAVENOUS
  Filled 2023-06-13: qty 1

## 2023-06-13 MED ORDER — DIPHENOXYLATE-ATROPINE 2.5-0.025 MG PO TABS: 2.0000 | ORAL_TABLET | Freq: Four times a day (QID) | ORAL | 0 refills | Status: DC | PRN

## 2023-06-13 MED ORDER — SODIUM CHLORIDE 0.9% FLUSH
10.0000 mL | Freq: Once | INTRAVENOUS | Status: AC
  Administered 2023-06-13: 10 mL via INTRAVENOUS
  Filled 2023-06-13: qty 10

## 2023-06-13 MED ORDER — HEPARIN SOD (PORK) LOCK FLUSH 100 UNIT/ML IV SOLN
500.0000 [IU] | Freq: Once | INTRAVENOUS | Status: AC
  Administered 2023-06-13: 500 [IU] via INTRAVENOUS
  Filled 2023-06-13: qty 5

## 2023-06-13 MED ORDER — PROCHLORPERAZINE EDISYLATE 10 MG/2ML IJ SOLN
10.0000 mg | Freq: Once | INTRAMUSCULAR | Status: AC
  Administered 2023-06-13: 10 mg via INTRAVENOUS
  Filled 2023-06-13: qty 2

## 2023-06-13 NOTE — Progress Notes (Unsigned)
Symptom Management Clinic  [facility] at [facility] A Department of the [place]. 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  Chief complaint/ Reason for visit- nausea, vomiting, diarrhea, weakness, pain  Heme/Onc history:  Oncology History  Primary malignant neuroendocrine tumor of esophagus (HCC)  03/20/2022 Initial Diagnosis   Primary malignant neuroendocrine neoplasm of esophagus (HCC)   03/20/2022 Cancer Staging   Staging form: Esophagus - Other Histologies, AJCC 8th Edition - Clinical stage from 03/20/2022: cTX, cN1, cM1, G3 - Signed by Creig Hines, MD on 03/20/2022 Histopathologic type: Large cell neuroendocrine carcinoma Histologic grading system: 3 grade system   03/30/2022 - 04/29/2022 Chemotherapy   Patient is on Treatment Plan : NEUROENDOCRINE GE junctionCisplatin/Tecentriq D1 + Etoposide D1-3 q21d     05/17/2022 - 09/08/2022 Chemotherapy   Patient is on Treatment Plan : GASTROESOPHAGEAL FOLFOX q14d      06/06/2023 -  Chemotherapy   Patient is on Treatment Plan : GE junction adenocarcinoma FOLFIRI q14d     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 She felt ok on Saturday, 4 days after chemo, then began feeling poorly on day 5 with nausea and malaise. Symptoms have worsened since that time. She estimates vomiting 6-8 times in last 24 hours. Has had several episodes of diarrhea. Complains of abdominal pain that is generalized and worse in left upper quadrant. She's been taking nausea medications as able. She was afraid fentanyl was contributing to her symptoms and removed her patch but nausea is not better.   ECOG FS:4 - Bedbound  Review of systems- Review of Systems  Constitutional:  Positive for malaise/fatigue and weight loss. Negative for chills and fever.  HENT:  Negative for hearing loss, nosebleeds, sore throat and tinnitus.   Eyes:  Negative for blurred vision and double vision.  Respiratory:  Negative for cough, hemoptysis, shortness of breath and wheezing.   Cardiovascular:  Negative for chest pain, palpitations and leg swelling.  Gastrointestinal:  Positive for abdominal pain, diarrhea, nausea and vomiting. Negative for blood in stool, constipation and melena.  Genitourinary:  Negative for dysuria, frequency and urgency.  Musculoskeletal:  Negative for back pain, falls, joint pain and myalgias.  Skin:  Negative for itching and rash.  Neurological:  Positive for weakness. Negative for dizziness, tingling, sensory change, loss of consciousness and headaches.  Endo/Heme/Allergies:  Negative for environmental allergies. Does not bruise/bleed easily.  Psychiatric/Behavioral:  Negative for depression. The patient is not nervous/anxious and does not have insomnia.      Allergies  Allergen Reactions   Sulfa Antibiotics Hives    Says her tongue swelled a bit   Sulfonamide Derivatives Swelling    Tongue swelling  Past Medical History:  Diagnosis Date   Anemia    Arthritis    Atrial fibrillation (HCC)    B12 deficiency    Cancer (HCC)    Dysrhythmia/history A. Fib    Hyperlipidemia    Hypothyroidism    Menopause     Osteoarthritis    Palpitations    Hx of, 25 years ago   Vertigo     Past Surgical History:  Procedure Laterality Date   BILATERAL SALPINGOOPHORECTOMY  2008   DILATION AND CURETTAGE OF UTERUS  08/24/2006   ESOPHAGOGASTRODUODENOSCOPY (EGD) WITH PROPOFOL N/A 03/01/2022   Procedure: ESOPHAGOGASTRODUODENOSCOPY (EGD) WITH PROPOFOL;  Surgeon: Regis Bill, MD;  Location: [facility] ENDOSCOPY;  Service: Endoscopy;  Laterality: N/A;   ESOPHAGOGASTRODUODENOSCOPY (EGD) WITH PROPOFOL N/A 05/04/2022   Procedure: ESOPHAGOGASTRODUODENOSCOPY (EGD) WITH PROPOFOL;  Surgeon: [name], MD;  Location: [facility] ENDOSCOPY;  Service: Gastroenterology;  Laterality: N/A;   ESOPHAGOGASTRODUODENOSCOPY (EGD) WITH PROPOFOL N/A 06/01/2023   Procedure: ESOPHAGOGASTRODUODENOSCOPY (EGD) WITH PROPOFOL;  Surgeon: [name], MD;  Location: [facility] ENDOSCOPY;  Service: Gastroenterology;  Laterality: N/A;   HEMOSTASIS CONTROL  06/01/2023   Procedure: HEMOSTASIS CONTROL;  Surgeon: [name], MD;  Location: [facility] ENDOSCOPY;  Service: Gastroenterology;;   incision tendon sheath for trigger finger Right    ring finger   LAPAROSCOPIC SUPRACERVICAL HYSTERECTOMY  2008   Dr. Arvil Chaco; with [facility]   NOSE SURGERY  03/2008   PORTA CATH INSERTION N/A 03/21/2022   Procedure: PORTA CATH INSERTION;  Surgeon: Annice Needy, MD;  Location: [facility] INVASIVE CV LAB;  Service: Cardiovascular;  Laterality: N/A;    Social History   Socioeconomic History   Marital status: Married    Spouse name: Not on file   Number of children: Not on file   Years of education: Not on file   Highest education level: Not on file  Occupational History   Occupation: Full time  Tobacco Use   Smoking status: Never   Smokeless tobacco: Never  Vaping Use   Vaping status: Never Used  Substance and Sexual Activity   Alcohol use: Never   Drug use: Never   Sexual activity: Not Currently    Birth control/protection: Surgical    Comment: Hysterectomy  Other Topics  Concern   Not on file  Social History Narrative   Married with 2 children   Gets regular exercise   Social Determinants of Health   Financial Resource Strain: Low Risk  (04/18/2023)   Received [facility]   Overall Financial Resource Strain (CARDIA)    Difficulty of Paying Living Expenses: Not hard at all  Food Insecurity: No Food Insecurity (04/18/2023)   [facility]   Hunger Vital Sign    Worried About Running Out of Food in the Last Year: Never true    Ran Out of Food in the Last Year: Never true  Transportation Needs: No Transportation Needs (04/18/2023)   Received from [facility] - Transportation    In the past 12 months, has lack of transportation kept you from medical appointments or from getting medications?: No    Lack of Transportation (Non-Medical): No  Physical Activity: Inactive (04/20/2022)   Exercise Vital Sign    Days of Exercise per Week: 0 days    Minutes of Exercise per Session: 0 min  Stress: No Stress Concern Present (04/20/2022)   [employer] of Occupational Health - Occupational Stress Questionnaire    Feeling of Stress :  Only a little  Social Connections: Socially Integrated (04/20/2022)   Social Connection and Isolation Panel [NHANES]    Frequency of Communication with Friends and Family: More than three times a week    Frequency of Social Gatherings with Friends and Family: More than three times a week    Attends Religious Services: More than 4 times per year    Active Member of [employer] or Organizations: Yes    Attends Engineer, structural: More than 4 times per year    Marital Status: Married  Catering manager Violence: Not on file    Family History  Problem Relation Age of Onset   Hyperlipidemia Mother    Hypertension Mother    Stroke Mother    Heart disease Father    Hyperlipidemia Father    Atrial fibrillation Father    Melanoma Father    Arrhythmia  Sister        Atrial fibrillation   Heart disease Sister    Atrial fibrillation Sister    Heart attack Brother    Breast cancer Neg Hx     Current Outpatient Medications:    Calcium Carbonate-Vitamin D 600-200 MG-UNIT TABS, Take 1 tablet by mouth daily.  , Disp: , Rfl:    cetirizine (ZYRTEC) 10 MG tablet, Take 10 mg by mouth daily., Disp: , Rfl:    Cholecalciferol 25 MCG (1000 UT) tablet, Take 1,000 Units by mouth daily., Disp: , Rfl:    dexamethasone (DECADRON) 4 MG tablet, Take 2 tablets (8 mg total) by mouth daily. Start the day after chemotherapy for 2 days. Take with food. (Patient not taking: Reported on 05/29/2023), Disp: 8 tablet, Rfl: 5   famotidine (PEPCID) 40 MG tablet, Take 1 tablet by mouth at bedtime., Disp: , Rfl:    fentaNYL (DURAGESIC) 25 MCG/HR, Place 1 patch onto the skin every 3 (three) days., Disp: 10 patch, Rfl: 0   flecainide (TAMBOCOR) 50 MG tablet, Take 1 tablet by mouth 2 (two) times daily., Disp: , Rfl:    lidocaine-prilocaine (EMLA) cream, Apply to affected area once, Disp: 30 g, Rfl: 3   loperamide (IMODIUM) 2 MG capsule, Take 2 tabs by mouth with first loose stool, then 1 tab with each additional loose stool as needed. Do not exceed 8 tabs in a 24-hour period (Patient not taking: Reported on 05/29/2023), Disp: 60 capsule, Rfl: 2   midodrine (PROAMATINE) 10 MG tablet, Take by mouth., Disp: , Rfl:    mometasone (NASONEX) 50 MCG/ACT nasal spray, Place 2 sprays into the nose daily., Disp: , Rfl:    OLANZapine (ZYPREXA) 10 MG tablet, Take 1 tablet (10 mg total) by mouth at bedtime., Disp: 30 tablet, Rfl: 1   ondansetron (ZOFRAN) 8 MG tablet, Take 1 tablet (8 mg total) by mouth every 8 (eight) hours as needed for nausea, vomiting or refractory nausea / vomiting. Start on the third day after chemotherapy., Disp: 30 tablet, Rfl: 1   oxyCODONE (OXY IR/ROXICODONE) 5 MG immediate release tablet, Take 1 tablet (5 mg total) by mouth every 4 (four) hours as needed for severe  pain., Disp: 60 tablet, Rfl: 0   pantoprazole (PROTONIX) 40 MG tablet, Take 40 mg by mouth daily., Disp: , Rfl:    polyethylene glycol (MIRALAX / GLYCOLAX) 17 g packet, Take by mouth., Disp: , Rfl:    psyllium (METAMUCIL) 58.6 % powder, Take 1 packet by mouth 3 (three) times daily., Disp: , Rfl:    Saccharomyces boulardii (PROBIOTIC) 250 MG CAPS, Take 1 capsule  by mouth daily., Disp: , Rfl:    simvastatin (ZOCOR) 20 MG tablet, Take 1 tablet by mouth at bedtime., Disp: , Rfl:    Zinc Citrate-Phytase (ZYTAZE) 25-500 MG CAPS, Take 1 Dose by mouth daily., Disp: , Rfl:  No current facility-administered medications for this visit.  Facility-Administered Medications Ordered in Other Visits:    heparin lock flush 100 UNIT/ML injection, , , ,    heparin lock flush 100 unit/mL, 500 Units, Intravenous, Once, Borders, Daryl Eastern, NP  Physical exam:  Vitals:   06/13/23 1031 06/13/23 1045  BP: 132/86   Pulse: 97   Resp: 18   Temp: 98 F (36.7 C) 98.1 F (36.7 C)  TempSrc: Tympanic Oral  Weight: 151 lb (68.5 kg)   Height: 5' (1.524 m)    Physical Exam Vitals reviewed.  Constitutional:      Appearance: She is ill-appearing.     Comments: Fatigued appearing  HENT:     Mouth/Throat:     Mouth: Mucous membranes are dry.  Eyes:     General: No scleral icterus. Cardiovascular:     Rate and Rhythm: Normal rate and regular rhythm.     Pulses: Normal pulses.  Pulmonary:     Effort: No respiratory distress.  Abdominal:     General: Bowel sounds are normal. There is no distension.     Palpations: Abdomen is soft.     Tenderness: There is abdominal tenderness (generalized). There is no guarding or rebound.  Musculoskeletal:        General: No deformity.  Skin:    General: Skin is dry.     Coloration: Skin is pale. Skin is not jaundiced.  Neurological:     Mental Status: She is alert and oriented to person, place, and time.  Psychiatric:        Mood and Affect: Mood normal.        Behavior:  Behavior normal.         Latest Ref Rng & Units 06/13/2023   10:21 AM  CMP  Glucose 70 - 99 mg/dL 161   BUN 8 - 23 mg/dL 17   Creatinine 0.96 - 1.00 mg/dL 0.45   Sodium 409 - 811 mmol/L 132   Potassium 3.5 - 5.1 mmol/L 3.4   Chloride 98 - 111 mmol/L 97   CO2 22 - 32 mmol/L 25   Calcium 8.9 - 10.3 mg/dL 8.6   Total Protein 6.5 - 8.1 g/dL 6.7   Total Bilirubin 0.3 - 1.2 mg/dL 0.7   Alkaline Phos 38 - 126 U/L 60   AST 15 - 41 U/L 14   ALT 0 - 44 U/L 9       Latest Ref Rng & Units 06/13/2023   10:21 AM  CBC  WBC 4.0 - 10.5 K/uL 2.7   Hemoglobin 12.0 - 15.0 g/dL 9.4   Hematocrit 91.4 - 46.0 % 28.7   Platelets 150 - 400 K/uL 136    No images are attached to the encounter.  CT ABDOMINAL MASS BIOPSY  Result Date: 06/02/2023 INDICATION: Primary malignant neuroendocrine tumor of esophagus, metastatic lymphadenopathy EXAM: CT-guided core needle biopsy of retroperitoneal lymphadenopathy MEDICATIONS: None. ANESTHESIA/SEDATION: Moderate (conscious) sedation was employed during this procedure. A total of Versed 1.5 mg and Fentanyl 75 mcg was administered intravenously. Moderate Sedation Time: 12 minutes. The patient's level of consciousness and vital signs were monitored continuously by radiology nursing throughout the procedure under my direct supervision. FLUOROSCOPY TIME:  N/a COMPLICATIONS: None immediate. PROCEDURE: Informed  written consent was obtained from the patient after a thorough discussion of the procedural risks, benefits and alternatives. All questions were addressed. Maximal Sterile Barrier Technique was utilized including caps, mask, sterile gowns, sterile gloves, sterile drape, hand hygiene and skin antiseptic. A timeout was performed prior to the initiation of the procedure. The patient was placed prone on the exam table. Limited CT of the abdomen and pelvis was performed for planning purposes. This demonstrated retroperitoneal lymphadenopathy, with a attention turned to left  para-aortic conglomerate lymph nodes. Note was made of the position of the left renal artery. Skin entry site was marked, and the overlying skin was prepped and draped in the standard sterile fashion. Local analgesia was obtained with 1% lidocaine. Using intermittent CT fluoroscopy, a 17 gauge introducer needle was advanced towards the identified lesion. Subsequently, core needle biopsy was performed using an 18 gauge core biopsy device x6 total passes. Specimens were submitted in formalin to pathology for further handling. Limited postprocedure imaging demonstrated no complicating feature. The patient tolerated the procedure well, and was transferred to recovery in stable condition. IMPRESSION: Successful CT-guided core needle biopsy of left retroperitoneal/para-aortic lymphadenopathy. Electronically Signed   By: Olive Bass M.D.   On: 06/02/2023 10:46    Assessment and plan- Patient is a 62 y.o. female who presents to clinic for   Chemotherapy induced nausea & vomiting-  Chemotherapy induced diarrhea-  Cancer related pain-  Symptomatic anemia- recent EGD with hemospray. Hmg was 7.7 on 06/08/23. Received transfusion.  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  05/18/23- Restaging scans at [facility] consistent with progression. Started FOLFIRI 06/06/23.    Visit Diagnosis No diagnosis found.  Patient expressed understanding and was in agreement with this plan. She also understands that She can call clinic at any time with any questions, concerns, or complaints.   Thank you for allowing me to participate in the care of this very pleasant patient.   Consuello Masse, DNP, AGNP-C, AOCNP [facility] at [facility] [phone]  CC:

## 2023-06-13 NOTE — Telephone Encounter (Signed)
Patient husband called stating that she has had a really rough night. He states she is in pain, very nauseated, and extremely weak. She has experienced some diarrhea today and that she is cold and clammy. He states she does have A fib but there has been no change in her heart rhythm.

## 2023-06-14 ENCOUNTER — Encounter: Payer: Self-pay | Admitting: Oncology

## 2023-06-14 ENCOUNTER — Inpatient Hospital Stay: Payer: BC Managed Care – PPO

## 2023-06-14 DIAGNOSIS — T451X5A Adverse effect of antineoplastic and immunosuppressive drugs, initial encounter: Secondary | ICD-10-CM | POA: Insufficient documentation

## 2023-06-14 DIAGNOSIS — G893 Neoplasm related pain (acute) (chronic): Secondary | ICD-10-CM | POA: Insufficient documentation

## 2023-06-15 ENCOUNTER — Encounter: Payer: Self-pay | Admitting: Oncology

## 2023-06-15 ENCOUNTER — Inpatient Hospital Stay: Payer: BC Managed Care – PPO

## 2023-06-15 NOTE — Telephone Encounter (Signed)
Lauren can you f/u? Did you see her after this message?

## 2023-06-19 ENCOUNTER — Encounter: Payer: Self-pay | Admitting: Oncology

## 2023-06-19 MED FILL — Dexamethasone Sodium Phosphate Inj 100 MG/10ML: INTRAMUSCULAR | Qty: 1 | Status: AC

## 2023-06-20 ENCOUNTER — Inpatient Hospital Stay: Payer: BC Managed Care – PPO

## 2023-06-20 ENCOUNTER — Inpatient Hospital Stay (HOSPITAL_BASED_OUTPATIENT_CLINIC_OR_DEPARTMENT_OTHER): Payer: BC Managed Care – PPO | Admitting: Oncology

## 2023-06-20 ENCOUNTER — Encounter: Payer: Self-pay | Admitting: Oncology

## 2023-06-20 VITALS — BP 112/64 | HR 96 | Temp 96.6°F | Resp 18 | Ht 60.0 in | Wt 143.6 lb

## 2023-06-20 DIAGNOSIS — C7A8 Other malignant neuroendocrine tumors: Secondary | ICD-10-CM

## 2023-06-20 DIAGNOSIS — E871 Hypo-osmolality and hyponatremia: Secondary | ICD-10-CM | POA: Diagnosis not present

## 2023-06-20 DIAGNOSIS — C155 Malignant neoplasm of lower third of esophagus: Secondary | ICD-10-CM

## 2023-06-20 DIAGNOSIS — D701 Agranulocytosis secondary to cancer chemotherapy: Secondary | ICD-10-CM | POA: Diagnosis not present

## 2023-06-20 DIAGNOSIS — R112 Nausea with vomiting, unspecified: Secondary | ICD-10-CM | POA: Diagnosis not present

## 2023-06-20 DIAGNOSIS — Z5111 Encounter for antineoplastic chemotherapy: Secondary | ICD-10-CM | POA: Diagnosis not present

## 2023-06-20 DIAGNOSIS — T451X5A Adverse effect of antineoplastic and immunosuppressive drugs, initial encounter: Secondary | ICD-10-CM

## 2023-06-20 LAB — CMP (CANCER CENTER ONLY)
ALT: 11 U/L (ref 0–44)
AST: 15 U/L (ref 15–41)
Albumin: 3.2 g/dL — ABNORMAL LOW (ref 3.5–5.0)
Alkaline Phosphatase: 61 U/L (ref 38–126)
Anion gap: 7 (ref 5–15)
BUN: 20 mg/dL (ref 8–23)
CO2: 25 mmol/L (ref 22–32)
Calcium: 8.1 mg/dL — ABNORMAL LOW (ref 8.9–10.3)
Chloride: 96 mmol/L — ABNORMAL LOW (ref 98–111)
Creatinine: 0.82 mg/dL (ref 0.44–1.00)
GFR, Estimated: >60 mL/min (ref >60)
Glucose, Bld: 127 mg/dL — ABNORMAL HIGH (ref 70–99)
Potassium: 4 mmol/L (ref 3.5–5.1)
Sodium: 128 mmol/L — ABNORMAL LOW (ref 135–145)
Total Bilirubin: 0.6 mg/dL (ref 0.3–1.2)
Total Protein: 5.9 g/dL — ABNORMAL LOW (ref 6.5–8.1)

## 2023-06-20 LAB — CBC WITH DIFFERENTIAL (CANCER CENTER ONLY)
Abs Immature Granulocytes: 0 10*3/uL (ref 0.00–0.07)
Basophils Absolute: 0 10*3/uL (ref 0.0–0.1)
Basophils Relative: 0 %
Eosinophils Absolute: 0.2 10*3/uL (ref 0.0–0.5)
Eosinophils Relative: 7 %
HCT: 27 % — ABNORMAL LOW (ref 36.0–46.0)
Hemoglobin: 8.8 g/dL — ABNORMAL LOW (ref 12.0–15.0)
Immature Granulocytes: 0 %
Lymphocytes Relative: 58 %
Lymphs Abs: 1.7 10*3/uL (ref 0.7–4.0)
MCH: 32.1 pg (ref 26.0–34.0)
MCHC: 32.6 g/dL (ref 30.0–36.0)
MCV: 98.5 fL (ref 80.0–100.0)
Monocytes Absolute: 0.4 10*3/uL (ref 0.1–1.0)
Monocytes Relative: 15 %
Neutro Abs: 0.6 10*3/uL — ABNORMAL LOW (ref 1.7–7.7)
Neutrophils Relative %: 20 %
Platelet Count: 178 10*3/uL (ref 150–400)
RBC: 2.74 MIL/uL — ABNORMAL LOW (ref 3.87–5.11)
RDW: 16.2 % — ABNORMAL HIGH (ref 11.5–15.5)
WBC Count: 2.9 10*3/uL — ABNORMAL LOW (ref 4.0–10.5)
nRBC: 0 % (ref 0.0–0.2)

## 2023-06-20 MED ORDER — HEPARIN SOD (PORK) LOCK FLUSH 100 UNIT/ML IV SOLN
500.0000 [IU] | Freq: Once | INTRAVENOUS | Status: AC
  Administered 2023-06-20: 500 [IU] via INTRAVENOUS
  Filled 2023-06-20: qty 5

## 2023-06-20 MED ORDER — LACTULOSE 10 GM/15ML PO SOLN: 10.0000 g | Freq: Three times a day (TID) | ORAL | 0 refills | Status: DC | PRN

## 2023-06-20 NOTE — Progress Notes (Signed)
The following biosimilar Fulphila ([id number]) has been selected for use in this patient.  Ebony Hail, Pharm.D., CPP 06/20/2023@10 :35 PM

## 2023-06-20 NOTE — Progress Notes (Signed)
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 Mia Creek on 03/01/2022 EGD showed a medium-sized ulcerating mass with no bleeding or stigmata of recent bleeding at the GE junction..  Mass was partially obstructing.  There is also mention of another ulcerated noncircumferential mass with oozing bleeding found at the GE junction with extension into the cardia.  Both these masses were biopsied.  Pathology was pending at the time of my visit with the patient and was reported to be a poorly differentiated carcinoma.  Patient is currently able to swallow soft foods without much difficulty.  However more solid foods occasionally feel stuck.    PET was denied by insurance.  CT chest abdomen and pelvis with contrast showed asymmetric distal esophageal wall thickening with thickening extending around the lesser curvature of the stomach through the gastric cardia and proximal fundus.  Paratracheal/paraesophageal, gastrohepatic and PET retroperitoneal adenopathy.  Left periaortic lymph node measuring 17 mm at the level of left renal hilum.   Final pathology showed there were 2 specimens collected.  Specimen a was poorly differentiated carcinoma with ulceration and necrosis.v verys weakly positive for synaptophysin 50% of the cells.  Cells negative for CK7 and CK23 for the BF1 CDX2 100 Melan-A HMB45  and CD20.  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  Pathology was also looked at but was signed out as poorly differentiated adenocarcinoma at [facility].  Patient did not have a good response to cisplatin etoposide Tecentriq regimen.  She was therefore switched to FOLFOX chemotherapy.  CPS score was 1 and therefore Keytruda was excluded.  HER2 negative.   Patient initially received 4 cycles of cisplatin and etoposide chemotherapy up until August 2023.  Scans did not show any significant response and but she was switched to FOLFOX chemotherapy starting September 2023.  She received 9 cycles of treatment up until December 2023.   She had CT scan as well as PET scan with the PET scan being done in January 2024 at [facility].PET CT scan showed marked interval improvement in the distal esophageal hypermetabolic activity compatible with treatment effect.  Resolution of hypermetabolic thoracoabdominal adenopathy and no new sites of metastatic disease.  Plan was to hold off on any further chemotherapy.  She did see Dr. Ewing Schlein at [facility] as well and underwent repeat EGD with biopsies which were negative for malignancy.  She is off treatment since December 2023   Patient underwent repeat staging scans on 05/18/2023 at [facility].  CT abdomen showed markedly worsening multistation adenopathy in the upper abdomen and retroperitoneum with masslike nodal conglomerate involving gastrohepatic and porta hepatis spaces.  New left adrenal metastases.  CT chest showed increased size of multiple mediastinal lymph nodes concerning for progressive metastatic disease.  Repeat  biopsy has been performed for NGS testing which is currently pending   Plan is to proceed with second line FOLFIRI chemotherapy.  Immunotherapy was not  added as CPS score was less than 10  Interval history-patient had significant nausea and vomiting for 4 to 5 days following her first cycle of chemotherapy.  She also feels significantly fatigued.  She is trying to keep up with her oral intake.  She has not had a bowel movement for the last 4 days  ECOG PS- 2 Pain scale- 2 Opioid associated constipation- yes  Review of systems- Review of Systems  Constitutional:  Positive for malaise/fatigue. Negative for chills, fever and weight loss.  HENT:  Negative for congestion, ear discharge and nosebleeds.   Eyes:  Negative for blurred vision.  Respiratory:  Negative for cough, hemoptysis, sputum production, shortness of breath and wheezing.   Cardiovascular:  Negative for chest pain, palpitations, orthopnea and claudication.  Gastrointestinal:  Negative for abdominal pain, blood in stool, constipation, diarrhea, heartburn, melena, nausea and vomiting.  Genitourinary:  Negative for dysuria, flank pain, frequency, hematuria and urgency.  Musculoskeletal:  Negative for back pain, joint pain and myalgias.  Skin:  Negative for rash.  Neurological:  Negative for dizziness, tingling, focal weakness, seizures, weakness and headaches.  Endo/Heme/Allergies:  Does not bruise/bleed easily.  Psychiatric/Behavioral:  Negative for depression and suicidal ideas. The patient does not have insomnia.       Allergies  Allergen Reactions   Sulfa Antibiotics Hives    Says her tongue swelled a bit   Sulfonamide Derivatives Swelling    Tongue swelling     Past Medical History:  Diagnosis Date   Anemia    Arthritis    Atrial fibrillation (HCC)    B12 deficiency    Cancer (HCC)    Dysrhythmia/history A. Fib    Hyperlipidemia    Hypothyroidism    Menopause    Osteoarthritis    Palpitations    Hx of, 25 years ago   Vertigo      Past Surgical History:  Procedure Laterality Date   BILATERAL SALPINGOOPHORECTOMY  2008   DILATION AND CURETTAGE OF UTERUS   08/24/2006   ESOPHAGOGASTRODUODENOSCOPY (EGD) WITH PROPOFOL N/A 03/01/2022   Procedure: ESOPHAGOGASTRODUODENOSCOPY (EGD) WITH PROPOFOL;  Surgeon: Regis Bill, MD;  Location: [facility] ENDOSCOPY;  Service: Endoscopy;  Laterality: N/A;   ESOPHAGOGASTRODUODENOSCOPY (EGD) WITH PROPOFOL N/A 05/04/2022   Procedure: ESOPHAGOGASTRODUODENOSCOPY (EGD) WITH PROPOFOL;  Surgeon: [name], MD;  Location: [facility] ENDOSCOPY;  Service: Gastroenterology;  Laterality: N/A;   ESOPHAGOGASTRODUODENOSCOPY (EGD) WITH PROPOFOL N/A 06/01/2023   Procedure: ESOPHAGOGASTRODUODENOSCOPY (EGD) WITH PROPOFOL;  Surgeon: [name], MD;  Location: [facility] ENDOSCOPY;  Service: Gastroenterology;  Laterality: N/A;   HEMOSTASIS CONTROL  06/01/2023   Procedure: HEMOSTASIS CONTROL;  Surgeon: [name], MD;  Location: [facility] ENDOSCOPY;  Service: Gastroenterology;;   incision tendon sheath for trigger finger Right    ring finger   LAPAROSCOPIC SUPRACERVICAL HYSTERECTOMY  2008   Dr. Arvil Chaco; with [facility]   NOSE SURGERY  03/2008   PORTA CATH INSERTION N/A 03/21/2022   Procedure: PORTA CATH INSERTION;  Surgeon: Annice Needy, MD;  Location: [facility] INVASIVE CV LAB;  Service: Cardiovascular;  Laterality: N/A;    Social History   Socioeconomic History   Marital status: Married    Spouse name: Not on file   Number of children: Not on file   Years of education: Not on file   Highest education level: Not on file  Occupational History  Occupation: Full time  Tobacco Use   Smoking status: Never   Smokeless tobacco: Never  Vaping Use   Vaping status: Never Used  Substance and Sexual Activity   Alcohol use: Never   Drug use: Never   Sexual activity: Not Currently    Birth control/protection: Surgical    Comment: Hysterectomy  Other Topics Concern   Not on file  Social History Narrative   Married with 2 children   Gets regular exercise   Social Determinants of Health   Financial Resource Strain: Low Risk  (04/18/2023)   [facility]   Overall Financial Resource Strain (CARDIA)    Difficulty of Paying Living Expenses: Not hard at all  Food Insecurity: No Food Insecurity (04/18/2023)   Received from [facility]   Hunger Vital Sign    Worried About Running Out of Food in the Last Year: Never true    Ran Out of Food in the Last Year: Never true  Transportation Needs: No Transportation Needs (04/18/2023)   Received from [facility] - Transportation    In the past 12 months, has lack of transportation kept you from medical appointments or from getting medications?: No    Lack of Transportation (Non-Medical): No  Physical Activity: Inactive (04/20/2022)   Exercise Vital Sign    Days of Exercise per Week: 0 days    Minutes of Exercise per Session: 0 min  Stress: No Stress Concern Present (04/20/2022)   [employer] of Occupational Health - Occupational Stress Questionnaire    Feeling of Stress : Only a little  Social Connections: Socially Integrated (04/20/2022)   Social Connection and Isolation Panel [NHANES]    Frequency of Communication with Friends and Family: More than three times a week    Frequency of Social Gatherings with Friends and Family: More than three times a week    Attends Religious Services: More than 4 times per year    Active Member of [employer] or Organizations: Yes    Attends Engineer, structural: More than 4 times per year    Marital Status: Married  Catering manager Violence: Not on file    Family History  Problem Relation Age of Onset   Hyperlipidemia Mother    Hypertension Mother    Stroke Mother    Heart disease Father    Hyperlipidemia Father    Atrial fibrillation Father    Melanoma Father    Arrhythmia Sister        Atrial fibrillation   Heart disease Sister    Atrial fibrillation Sister    Heart attack Brother    Breast cancer Neg Hx      Current Outpatient Medications:    Cobalamin Combinations  (B-12 PLUS FOLIC ACID PO), , Disp: , Rfl:    cyanocobalamin (VITAMIN B12) 1000 MCG tablet, Take 1,000 mcg by mouth daily., Disp: , Rfl:    dexamethasone (DECADRON) 4 MG tablet, Take 2 tablets (8 mg total) by mouth daily. Start the day after chemotherapy for 2 days. Take with food., Disp: 8 tablet, Rfl: 5   dicyclomine (BENTYL) 20 MG tablet, Take 20 mg by mouth 2 (two) times daily., Disp: , Rfl:    diphenoxylate-atropine (LOMOTIL) 2.5-0.025 MG tablet, Take 2 tablets by mouth every 6 (six) hours as needed for diarrhea or loose stools., Disp: 120 tablet, Rfl: 0   etodolac (LODINE) 400 MG tablet, Take 400 mg by mouth 2 (two) times daily., Disp: ,  Rfl:    flecainide (TAMBOCOR) 50 MG tablet, Take 1 tablet by mouth 2 (two) times daily., Disp: , Rfl:    lactulose (CHRONULAC) 10 GM/15ML solution, Take 15 mLs (10 g total) by mouth 3 (three) times daily as needed for mild constipation., Disp: 236 mL, Rfl: 0   metoprolol tartrate (LOPRESSOR) 25 MG tablet, Take 25 mg by mouth 2 (two) times daily as needed., Disp: , Rfl:    midodrine (PROAMATINE) 10 MG tablet, Take by mouth., Disp: , Rfl:    mometasone (NASONEX) 50 MCG/ACT nasal spray, Place 2 sprays into the nose daily., Disp: , Rfl:    OLANZapine (ZYPREXA) 10 MG tablet, Take 1 tablet (10 mg total) by mouth at bedtime., Disp: 30 tablet, Rfl: 1   ondansetron (ZOFRAN) 8 MG tablet, Take 1 tablet (8 mg total) by mouth every 8 (eight) hours as needed for nausea, vomiting or refractory nausea / vomiting. Start on the third day after chemotherapy., Disp: 30 tablet, Rfl: 1   oxyCODONE (OXY IR/ROXICODONE) 5 MG immediate release tablet, Take 1 tablet (5 mg total) by mouth every 4 (four) hours as needed for severe pain., Disp: 60 tablet, Rfl: 0   sucralfate (CARAFATE) 1 g tablet, Take 1 g by mouth 4 (four) times daily., Disp: , Rfl:    traZODone (DESYREL) 50 MG tablet, Take 50 mg by mouth at bedtime as needed., Disp: , Rfl:    Calcium Carbonate-Vitamin D 600-200 MG-UNIT  TABS, Take 1 tablet by mouth daily.   (Patient not taking: Reported on 06/13/2023), Disp: , Rfl:    cetirizine (ZYRTEC) 10 MG tablet, Take 10 mg by mouth daily. (Patient not taking: Reported on 06/13/2023), Disp: , Rfl:    Cholecalciferol 25 MCG (1000 UT) tablet, Take 1,000 Units by mouth daily. (Patient not taking: Reported on 06/13/2023), Disp: , Rfl:    famotidine (PEPCID) 40 MG tablet, Take 1 tablet by mouth at bedtime. (Patient not taking: Reported on 06/13/2023), Disp: , Rfl:    fentaNYL (DURAGESIC) 25 MCG/HR, Place 1 patch onto the skin every 3 (three) days. (Patient not taking: Reported on 06/13/2023), Disp: 10 patch, Rfl: 0   lidocaine-prilocaine (EMLA) cream, Apply to affected area once (Patient not taking: Reported on 06/13/2023), Disp: 30 g, Rfl: 3   loperamide (IMODIUM) 2 MG capsule, Take 2 tabs by mouth with first loose stool, then 1 tab with each additional loose stool as needed. Do not exceed 8 tabs in a 24-hour period (Patient not taking: Reported on 05/29/2023), Disp: 60 capsule, Rfl: 2   Melatonin 10 MG TABS, Take by mouth. (Patient not taking: Reported on 06/20/2023), Disp: , Rfl:    pantoprazole (PROTONIX) 40 MG tablet, Take 40 mg by mouth daily. (Patient not taking: Reported on 06/13/2023), Disp: , Rfl:    polyethylene glycol (MIRALAX / GLYCOLAX) 17 g packet, Take by mouth. (Patient not taking: Reported on 06/13/2023), Disp: , Rfl:    promethazine (PHENERGAN) 25 MG tablet, Take 1 tablet (25 mg total) by mouth every 8 (eight) hours as needed for refractory nausea / vomiting. May cause sleepiness. (Patient not taking: Reported on 06/20/2023), Disp: 30 tablet, Rfl: 0   psyllium (METAMUCIL) 58.6 % powder, Take 1 packet by mouth 3 (three) times daily. (Patient not taking: Reported on 06/13/2023), Disp: , Rfl:    Saccharomyces boulardii (PROBIOTIC) 250 MG CAPS, Take 1 capsule by mouth daily. (Patient not taking: Reported on 06/13/2023), Disp: , Rfl:    simvastatin (ZOCOR) 20 MG tablet, Take 1 tablet  by mouth at bedtime. (Patient not taking: Reported on 06/13/2023), Disp: , Rfl:    Zinc Citrate-Phytase (ZYTAZE) 25-500 MG CAPS, Take 1 Dose by mouth daily. (Patient not taking: Reported on 06/13/2023), Disp: , Rfl:  No current facility-administered medications for this visit.  Facility-Administered Medications Ordered in Other Visits:    heparin lock flush 100 UNIT/ML injection, , , ,   Physical exam:  Vitals:   06/20/23 0840  BP: 112/64  Pulse: 96  Resp: 18  Temp: (!) 96.6 F (35.9 C)  TempSrc: Tympanic  SpO2: 100%  Weight: 143 lb 9.6 oz (65.1 kg)  Height: 5' (1.524 m)   Physical Exam Constitutional:      Comments: Sitting in a wheel chair.  Appears in no acute distress.  Appears fatigued  Cardiovascular:     Rate and Rhythm: Normal rate and regular rhythm.     Heart sounds: Normal heart sounds.  Pulmonary:     Effort: Pulmonary effort is normal.     Breath sounds: Normal breath sounds.  Abdominal:     General: Bowel sounds are normal.     Palpations: Abdomen is soft.  Skin:    General: Skin is warm and dry.  Neurological:     Mental Status: She is alert and oriented to person, place, and time.         Latest Ref Rng & Units 06/20/2023    8:09 AM  CMP  Glucose 70 - 99 mg/dL 161   BUN 8 - 23 mg/dL 20   Creatinine 0.96 - 1.00 mg/dL 0.45   Sodium 409 - 811 mmol/L 128   Potassium 3.5 - 5.1 mmol/L 4.0   Chloride 98 - 111 mmol/L 96   CO2 22 - 32 mmol/L 25   Calcium 8.9 - 10.3 mg/dL 8.1   Total Protein 6.5 - 8.1 g/dL 5.9   Total Bilirubin 0.3 - 1.2 mg/dL 0.6   Alkaline Phos 38 - 126 U/L 61   AST 15 - 41 U/L 15   ALT 0 - 44 U/L 11       Latest Ref Rng & Units 06/20/2023    8:09 AM  CBC  WBC 4.0 - 10.5 K/uL 2.9   Hemoglobin 12.0 - 15.0 g/dL 8.8   Hematocrit 91.4 - 46.0 % 27.0   Platelets 150 - 400 K/uL 178     No images are attached to the encounter.  CT ABDOMINAL MASS BIOPSY  Result Date: 06/02/2023 INDICATION: Primary malignant neuroendocrine tumor of  esophagus, metastatic lymphadenopathy EXAM: CT-guided core needle biopsy of retroperitoneal lymphadenopathy MEDICATIONS: None. ANESTHESIA/SEDATION: Moderate (conscious) sedation was employed during this procedure. A total of Versed 1.5 mg and Fentanyl 75 mcg was administered intravenously. Moderate Sedation Time: 12 minutes. The patient's level of consciousness and vital signs were monitored continuously by radiology nursing throughout the procedure under my direct supervision. FLUOROSCOPY TIME:  N/a COMPLICATIONS: None immediate. PROCEDURE: Informed written consent was obtained from the patient after a thorough discussion of the procedural risks, benefits and alternatives. All questions were addressed. Maximal Sterile Barrier Technique was utilized including caps, mask, sterile gowns, sterile gloves, sterile drape, hand hygiene and skin antiseptic. A timeout was performed prior to the initiation of the procedure. The patient was placed prone on the exam table. Limited CT of the abdomen and pelvis was performed for planning purposes. This demonstrated retroperitoneal lymphadenopathy, with a attention turned to left para-aortic conglomerate lymph nodes. Note was made of the position of the left renal artery. Skin entry  site was marked, and the overlying skin was prepped and draped in the standard sterile fashion. Local analgesia was obtained with 1% lidocaine. Using intermittent CT fluoroscopy, a 17 gauge introducer needle was advanced towards the identified lesion. Subsequently, core needle biopsy was performed using an 18 gauge core biopsy device x6 total passes. Specimens were submitted in formalin to pathology for further handling. Limited postprocedure imaging demonstrated no complicating feature. The patient tolerated the procedure well, and was transferred to recovery in stable condition. IMPRESSION: Successful CT-guided core needle biopsy of left retroperitoneal/para-aortic lymphadenopathy. Electronically  Signed   By: Olive Bass M.D.   On: 06/02/2023 10:46     Assessment and plan- Patient is a 62 y.o. female with metastatic adenocarcinoma of the GE junction with retroperitoneal adenopathy here for on treatment assessment prior to cycle 2 of FOLFIRI chemotherapy  White cell count is 2.9 today with an ANC of 0.6.  She will therefore not be receiving chemotherapy today.  Chemo to be deferred by 1 week.  I will plan to add a Udenyca with each chemotherapy cycle.  I am also eliminating 5-FU bolus with subsequent cycles.  Hyponatremia: Given IV fluid shortage and the fact the patient is otherwise hemodynamically stable I have encouraged her to take oral electrolyte solutions or Gatorade and see if we can bring her sodium and chloride up.  Kidney functions remain normal.  If electrolytes remain abnormal next week I will give her IV fluids.  Chemo induced nausea vomiting: Olanzapine has been started along with Decadron and as needed nausea medications.  Continue to monitor  Neoplasm related pain: Continue fentanyl patch And as needed oxycodone  Opioid-induced constipation: I am sending her a prescription for lactulose in addition to MiraLAX and senna.  She is also taking milk of magnesia   Visit Diagnosis 1. 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 Chemotherapy induced neutropenia (HCC)   3. Hyponatremia   4. Chemotherapy induced nausea and vomiting      Dr. Owens Shark, MD, MPH [facility] [phone] 06/20/2023 9:09 AM

## 2023-06-22 ENCOUNTER — Inpatient Hospital Stay: Payer: BC Managed Care – PPO

## 2023-06-22 VITALS — BP 107/59 | HR 86 | Temp 97.9°F | Resp 16

## 2023-06-22 DIAGNOSIS — C7A8 Other malignant neuroendocrine tumors: Secondary | ICD-10-CM

## 2023-06-22 DIAGNOSIS — Z5111 Encounter for antineoplastic chemotherapy: Secondary | ICD-10-CM | POA: Diagnosis not present

## 2023-06-22 DIAGNOSIS — Z95828 Presence of other vascular implants and grafts: Secondary | ICD-10-CM

## 2023-06-22 DIAGNOSIS — E871 Hypo-osmolality and hyponatremia: Secondary | ICD-10-CM

## 2023-06-22 MED ORDER — SODIUM CHLORIDE 0.9% FLUSH
10.0000 mL | Freq: Once | INTRAVENOUS | Status: AC
  Administered 2023-06-22: 10 mL via INTRAVENOUS
  Filled 2023-06-22: qty 10

## 2023-06-22 MED ORDER — SODIUM CHLORIDE 0.9 % IV SOLN
INTRAVENOUS | Status: DC
  Filled 2023-06-22: qty 250

## 2023-06-22 MED ORDER — HEPARIN SOD (PORK) LOCK FLUSH 100 UNIT/ML IV SOLN
500.0000 [IU] | Freq: Once | INTRAVENOUS | Status: AC
  Administered 2023-06-22: 500 [IU] via INTRAVENOUS
  Filled 2023-06-22: qty 5

## 2023-06-22 MED ORDER — SODIUM CHLORIDE 0.9 % IV SOLN
INTRAVENOUS | Status: AC
  Filled 2023-06-22 (×2): qty 250

## 2023-06-22 NOTE — Patient Instructions (Signed)

## 2023-06-23 ENCOUNTER — Encounter: Payer: Self-pay | Admitting: Oncology

## 2023-06-26 MED FILL — Dexamethasone Sodium Phosphate Inj 100 MG/10ML: INTRAMUSCULAR | Qty: 1 | Status: AC

## 2023-06-27 ENCOUNTER — Other Ambulatory Visit: Payer: Self-pay | Admitting: *Deleted

## 2023-06-27 ENCOUNTER — Inpatient Hospital Stay (HOSPITAL_BASED_OUTPATIENT_CLINIC_OR_DEPARTMENT_OTHER): Payer: BC Managed Care – PPO | Admitting: Oncology

## 2023-06-27 ENCOUNTER — Encounter: Payer: Self-pay | Admitting: Oncology

## 2023-06-27 ENCOUNTER — Inpatient Hospital Stay: Payer: BC Managed Care – PPO

## 2023-06-27 VITALS — BP 96/53 | HR 85

## 2023-06-27 VITALS — BP 96/60 | HR 88 | Temp 97.7°F | Resp 18 | Ht 60.0 in | Wt 150.1 lb

## 2023-06-27 DIAGNOSIS — D649 Anemia, unspecified: Secondary | ICD-10-CM

## 2023-06-27 DIAGNOSIS — Z5111 Encounter for antineoplastic chemotherapy: Secondary | ICD-10-CM

## 2023-06-27 DIAGNOSIS — C7A8 Other malignant neuroendocrine tumors: Secondary | ICD-10-CM

## 2023-06-27 DIAGNOSIS — R7989 Other specified abnormal findings of blood chemistry: Secondary | ICD-10-CM | POA: Diagnosis not present

## 2023-06-27 DIAGNOSIS — C155 Malignant neoplasm of lower third of esophagus: Secondary | ICD-10-CM

## 2023-06-27 DIAGNOSIS — D6481 Anemia due to antineoplastic chemotherapy: Secondary | ICD-10-CM

## 2023-06-27 DIAGNOSIS — G893 Neoplasm related pain (acute) (chronic): Secondary | ICD-10-CM

## 2023-06-27 DIAGNOSIS — T451X5A Adverse effect of antineoplastic and immunosuppressive drugs, initial encounter: Secondary | ICD-10-CM

## 2023-06-27 DIAGNOSIS — Z23 Encounter for immunization: Secondary | ICD-10-CM

## 2023-06-27 LAB — CMP (CANCER CENTER ONLY)
ALT: 150 U/L — ABNORMAL HIGH (ref 0–44)
AST: 203 U/L (ref 15–41)
Albumin: 2.7 g/dL — ABNORMAL LOW (ref 3.5–5.0)
Alkaline Phosphatase: 626 U/L — ABNORMAL HIGH (ref 38–126)
Anion gap: 7 (ref 5–15)
BUN: 12 mg/dL (ref 8–23)
CO2: 24 mmol/L (ref 22–32)
Calcium: 7.9 mg/dL — ABNORMAL LOW (ref 8.9–10.3)
Chloride: 98 mmol/L (ref 98–111)
Creatinine: 0.8 mg/dL (ref 0.44–1.00)
GFR, Estimated: >60 mL/min (ref >60)
Glucose, Bld: 131 mg/dL — ABNORMAL HIGH (ref 70–99)
Potassium: 3.7 mmol/L (ref 3.5–5.1)
Sodium: 129 mmol/L — ABNORMAL LOW (ref 135–145)
Total Bilirubin: 1.1 mg/dL (ref 0.3–1.2)
Total Protein: 5.6 g/dL — ABNORMAL LOW (ref 6.5–8.1)

## 2023-06-27 LAB — CBC WITH DIFFERENTIAL (CANCER CENTER ONLY)
Abs Immature Granulocytes: 0.03 10*3/uL (ref 0.00–0.07)
Basophils Absolute: 0.1 10*3/uL (ref 0.0–0.1)
Basophils Relative: 1 %
Eosinophils Absolute: 0.2 10*3/uL (ref 0.0–0.5)
Eosinophils Relative: 3 %
HCT: 23.8 % — ABNORMAL LOW (ref 36.0–46.0)
Hemoglobin: 7.6 g/dL — ABNORMAL LOW (ref 12.0–15.0)
Immature Granulocytes: 0 %
Lymphocytes Relative: 14 %
Lymphs Abs: 1 10*3/uL (ref 0.7–4.0)
MCH: 30.9 pg (ref 26.0–34.0)
MCHC: 31.9 g/dL (ref 30.0–36.0)
MCV: 96.7 fL (ref 80.0–100.0)
Monocytes Absolute: 1 10*3/uL (ref 0.1–1.0)
Monocytes Relative: 14 %
Neutro Abs: 4.7 10*3/uL (ref 1.7–7.7)
Neutrophils Relative %: 68 %
Platelet Count: 268 10*3/uL (ref 150–400)
RBC: 2.46 MIL/uL — ABNORMAL LOW (ref 3.87–5.11)
RDW: 16.8 % — ABNORMAL HIGH (ref 11.5–15.5)
WBC Count: 6.8 10*3/uL (ref 4.0–10.5)
nRBC: 0 % (ref 0.0–0.2)

## 2023-06-27 LAB — PREPARE RBC (CROSSMATCH)

## 2023-06-27 MED ORDER — SODIUM CHLORIDE 0.9 % IV SOLN
120.0000 mg/m2 | Freq: Once | INTRAVENOUS | Status: AC
  Administered 2023-06-27: 200 mg via INTRAVENOUS
  Filled 2023-06-27: qty 10

## 2023-06-27 MED ORDER — SODIUM CHLORIDE 0.9 % IV SOLN
400.0000 mg/m2 | Freq: Once | INTRAVENOUS | Status: AC
  Administered 2023-06-27: 692 mg via INTRAVENOUS
  Filled 2023-06-27: qty 34.6

## 2023-06-27 MED ORDER — SODIUM CHLORIDE 0.9 % IV SOLN
10.0000 mg | Freq: Once | INTRAVENOUS | Status: AC
  Administered 2023-06-27: 10 mg via INTRAVENOUS
  Filled 2023-06-27: qty 1
  Filled 2023-06-27: qty 10

## 2023-06-27 MED ORDER — SODIUM CHLORIDE 0.9 % IV SOLN
Freq: Once | INTRAVENOUS | Status: AC
  Filled 2023-06-27: qty 250

## 2023-06-27 MED ORDER — ATROPINE SULFATE 1 MG/ML IV SOLN
0.5000 mg | Freq: Once | INTRAVENOUS | Status: AC | PRN
  Administered 2023-06-27: 0.5 mg via INTRAVENOUS
  Filled 2023-06-27: qty 1

## 2023-06-27 MED ORDER — SODIUM CHLORIDE 0.9 % IV SOLN
2000.0000 mg/m2 | INTRAVENOUS | Status: DC
  Administered 2023-06-27: 3500 mg via INTRAVENOUS
  Filled 2023-06-27: qty 70

## 2023-06-27 MED ORDER — INFLUENZA VIRUS VACC SPLIT PF (FLUZONE) 0.5 ML IM SUSY
0.5000 mL | PREFILLED_SYRINGE | Freq: Once | INTRAMUSCULAR | Status: AC
  Administered 2023-06-27: 0.5 mL via INTRAMUSCULAR
  Filled 2023-06-27: qty 0.5

## 2023-06-27 MED ORDER — PALONOSETRON HCL INJECTION 0.25 MG/5ML
0.2500 mg | Freq: Once | INTRAVENOUS | Status: AC
  Administered 2023-06-27: 0.25 mg via INTRAVENOUS
  Filled 2023-06-27: qty 5

## 2023-06-27 NOTE — Progress Notes (Signed)
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 Mia Creek on 03/01/2022 EGD showed a medium-sized ulcerating mass with no bleeding or stigmata of recent bleeding at the GE junction..  Mass was partially obstructing.  There is also mention of another ulcerated noncircumferential mass with oozing bleeding found at the GE junction with extension into the cardia.  Both these masses were biopsied.  Pathology was pending at the time of my visit with the patient and was reported to be a poorly differentiated carcinoma.  Patient is currently able to swallow soft foods without much difficulty.  However more solid foods occasionally feel stuck.    PET was denied by insurance.  CT chest abdomen and pelvis with contrast showed asymmetric distal esophageal wall thickening with thickening extending around the lesser curvature of the stomach through the gastric cardia and proximal fundus.  Paratracheal/paraesophageal, gastrohepatic and PET retroperitoneal adenopathy.  Left periaortic lymph node measuring 17 mm at the level of left renal hilum.   Final pathology showed there were 2 specimens collected.  Specimen a was poorly differentiated carcinoma with ulceration and necrosis.v verys weakly positive for synaptophysin 50% of the cells.  Cells negative for CK7 and CK23 for the BF1 CDX2 100 Melan-A HMB45  and CD20.  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  Pathology was also looked at but was signed out as poorly differentiated adenocarcinoma at [facility]  Patient did not have a good response to cisplatin etoposide Tecentriq regimen.  She was therefore switched to FOLFOX chemotherapy.  CPS score was 1 and therefore Keytruda was excluded.  HER2 negative.   Patient initially received 4 cycles of cisplatin and etoposide chemotherapy up until August 2023.  Scans did not show any significant response and but she was switched to FOLFOX chemotherapy starting September 2023.  She received 9 cycles of treatment up until December 2023.   She had CT scan as well as PET scan with the PET scan being done in January 2024 at [facility].PET CT scan showed marked interval improvement in the distal esophageal hypermetabolic activity compatible with treatment effect.  Resolution of hypermetabolic thoracoabdominal adenopathy and no new sites of metastatic disease.  Plan was to hold off on any further chemotherapy.  She did see Dr. Ewing Schlein at [facility] as well and underwent repeat EGD with biopsies which were negative for malignancy.  She is off treatment since December 2023   Patient underwent repeat staging scans on 05/18/2023 at [facility].  CT abdomen showed markedly worsening multistation adenopathy in the upper abdomen and retroperitoneum with masslike nodal conglomerate involving gastrohepatic and porta hepatis spaces.  New left adrenal metastases.  CT chest showed increased size of multiple mediastinal lymph nodes concerning for progressive metastatic disease.  Repeat biopsy has been performed for NGS testing which is currently pending   Plan is to proceed with second line FOLFIRI chemotherapy.  Immunotherapy was not  added as CPS score was less than 10  Interval history-patient still reports significant fatigue although she is trying to improve her oral hydration at home.  She did receive IV fluids last week in our clinic.  Pain is currently well-controlled with fentanyl patches and as needed oxycodone  ECOG PS- 2 Pain scale- 0 Opioid associated constipation- no  Review of systems- Review of Systems  Constitutional:  Positive for malaise/fatigue. Negative for chills, fever and weight loss.  HENT:  Negative for congestion, ear discharge and nosebleeds.   Eyes:  Negative for blurred vision.  Respiratory:  Negative for cough, hemoptysis, sputum production, shortness of breath and wheezing.   Cardiovascular:  Negative for chest pain, palpitations, orthopnea and claudication.  Gastrointestinal:  Negative for abdominal pain, blood in stool, constipation, diarrhea, heartburn, melena, nausea and vomiting.  Genitourinary:  Negative for dysuria, flank pain, frequency, hematuria and urgency.  Musculoskeletal:  Negative for back pain, joint pain and myalgias.  Skin:  Negative for rash.  Neurological:  Negative for dizziness, tingling, focal weakness, seizures, weakness and headaches.  Endo/Heme/Allergies:  Does not bruise/bleed easily.  Psychiatric/Behavioral:  Negative for depression and suicidal ideas. The patient does not have insomnia.       Allergies  Allergen Reactions   Sulfa Antibiotics Hives    Says her tongue swelled a bit   Sulfonamide Derivatives Swelling    Tongue swelling     Past Medical History:  Diagnosis Date   Anemia    Arthritis    Atrial fibrillation (HCC)    B12 deficiency    Cancer (HCC)    Dysrhythmia/history A. Fib    Hyperlipidemia    Hypothyroidism    Menopause    Osteoarthritis    Palpitations    Hx of, 25 years ago   Vertigo      Past Surgical History:  Procedure Laterality Date   BILATERAL SALPINGOOPHORECTOMY  2008   DILATION AND CURETTAGE OF UTERUS   08/24/2006   ESOPHAGOGASTRODUODENOSCOPY (EGD) WITH PROPOFOL N/A 03/01/2022   Procedure: ESOPHAGOGASTRODUODENOSCOPY (EGD) WITH PROPOFOL;  Surgeon: Regis Bill, MD;  Location: [facility] ENDOSCOPY;  Service: Endoscopy;  Laterality: N/A;   ESOPHAGOGASTRODUODENOSCOPY (EGD) WITH PROPOFOL N/A 05/04/2022   Procedure: ESOPHAGOGASTRODUODENOSCOPY (EGD) WITH PROPOFOL;  Surgeon: [name], MD;  Location: [facility] ENDOSCOPY;  Service: Gastroenterology;  Laterality: N/A;   ESOPHAGOGASTRODUODENOSCOPY (EGD) WITH PROPOFOL N/A 06/01/2023   Procedure: ESOPHAGOGASTRODUODENOSCOPY (EGD) WITH PROPOFOL;  Surgeon: [name], MD;  Location: [facility] ENDOSCOPY;  Service: Gastroenterology;  Laterality: N/A;   HEMOSTASIS CONTROL  06/01/2023   Procedure: HEMOSTASIS CONTROL;  Surgeon: [name], MD;  Location: [facility] ENDOSCOPY;  Service: Gastroenterology;;   incision tendon sheath for trigger finger Right    ring finger   LAPAROSCOPIC SUPRACERVICAL HYSTERECTOMY  2008   Dr. Arvil Chaco; with BSO   NOSE SURGERY  03/2008   PORTA CATH INSERTION N/A 03/21/2022   Procedure: PORTA CATH INSERTION;  Surgeon: Annice Needy, MD;  Location: [facility] INVASIVE CV LAB;  Service: Cardiovascular;  Laterality: N/A;    Social History   Socioeconomic History   Marital status: Married    Spouse name: Not on file   Number of children: Not on file   Years of education: Not on file   Highest education level: Not on file  Occupational History   Occupation: Full time  Tobacco  Use   Smoking status: Never   Smokeless tobacco: Never  Vaping Use   Vaping status: Never Used  Substance and Sexual Activity   Alcohol use: Never   Drug use: Never   Sexual activity: Not Currently    Birth control/protection: Surgical    Comment: Hysterectomy  Other Topics Concern   Not on file  Social History Narrative   Married with 2 children   Gets regular exercise   Social Determinants of Health   Financial Resource Strain: Low Risk  (04/18/2023)   Received from  [facility]   Overall Financial Resource Strain (CARDIA)    Difficulty of Paying Living Expenses: Not hard at all  Food Insecurity: No Food Insecurity (04/18/2023)   Received from [facility]   Hunger Vital Sign    Worried About Running Out of Food in the Last Year: Never true    Ran Out of Food in the Last Year: Never true  Transportation Needs: No Transportation Needs (04/18/2023)   Received from [facility] - Transportation    In the past 12 months, has lack of transportation kept you from medical appointments or from getting medications?: No    Lack of Transportation (Non-Medical): No  Physical Activity: Inactive (04/20/2022)   Exercise Vital Sign    Days of Exercise per Week: 0 days    Minutes of Exercise per Session: 0 min  Stress: No Stress Concern Present (04/20/2022)   [employer] of Occupational Health - Occupational Stress Questionnaire    Feeling of Stress : Only a little  Social Connections: Socially Integrated (04/20/2022)   Social Connection and Isolation Panel [NHANES]    Frequency of Communication with Friends and Family: More than three times a week    Frequency of Social Gatherings with Friends and Family: More than three times a week    Attends Religious Services: More than 4 times per year    Active Member of [employer] or Organizations: Yes    Attends Engineer, structural: More than 4 times per year    Marital Status: Married  Catering manager Violence: Not on file    Family History  Problem Relation Age of Onset   Hyperlipidemia Mother    Hypertension Mother    Stroke Mother    Heart disease Father    Hyperlipidemia Father    Atrial fibrillation Father    Melanoma Father    Arrhythmia Sister        Atrial fibrillation   Heart disease Sister    Atrial fibrillation Sister    Heart attack Brother    Breast cancer Neg Hx      Current Outpatient Medications:    Calcium Carbonate-Vitamin  D 600-200 MG-UNIT TABS, Take 1 tablet by mouth daily.   (Patient not taking: Reported on 06/13/2023), Disp: , Rfl:    cetirizine (ZYRTEC) 10 MG tablet, Take 10 mg by mouth daily. (Patient not taking: Reported on 06/13/2023), Disp: , Rfl:    Cholecalciferol 25 MCG (1000 UT) tablet, Take 1,000 Units by mouth daily. (Patient not taking: Reported on 06/13/2023), Disp: , Rfl:    Cobalamin Combinations (B-12 PLUS FOLIC ACID PO), , Disp: , Rfl:    cyanocobalamin (VITAMIN B12) 1000 MCG tablet, Take 1,000 mcg by mouth daily., Disp: , Rfl:    dexamethasone (DECADRON) 4 MG tablet, Take 2 tablets (8 mg total) by mouth daily. Start the day after chemotherapy for 2 days. Take with food., Disp: 8  tablet, Rfl: 5   dicyclomine (BENTYL) 20 MG tablet, Take 20 mg by mouth 2 (two) times daily., Disp: , Rfl:    diphenoxylate-atropine (LOMOTIL) 2.5-0.025 MG tablet, Take 2 tablets by mouth every 6 (six) hours as needed for diarrhea or loose stools., Disp: 120 tablet, Rfl: 0   etodolac (LODINE) 400 MG tablet, Take 400 mg by mouth 2 (two) times daily., Disp: , Rfl:    famotidine (PEPCID) 40 MG tablet, Take 1 tablet by mouth at bedtime. (Patient not taking: Reported on 06/13/2023), Disp: , Rfl:    fentaNYL (DURAGESIC) 25 MCG/HR, Place 1 patch onto the skin every 3 (three) days. (Patient not taking: Reported on 06/13/2023), Disp: 10 patch, Rfl: 0   flecainide (TAMBOCOR) 50 MG tablet, Take 1 tablet by mouth 2 (two) times daily., Disp: , Rfl:    lactulose (CHRONULAC) 10 GM/15ML solution, Take 15 mLs (10 g total) by mouth 3 (three) times daily as needed for mild constipation., Disp: 236 mL, Rfl: 0   lidocaine-prilocaine (EMLA) cream, Apply to affected area once (Patient not taking: Reported on 06/13/2023), Disp: 30 g, Rfl: 3   loperamide (IMODIUM) 2 MG capsule, Take 2 tabs by mouth with first loose stool, then 1 tab with each additional loose stool as needed. Do not exceed 8 tabs in a 24-hour period (Patient not taking: Reported on  05/29/2023), Disp: 60 capsule, Rfl: 2   Melatonin 10 MG TABS, Take by mouth. (Patient not taking: Reported on 06/20/2023), Disp: , Rfl:    metoprolol tartrate (LOPRESSOR) 25 MG tablet, Take 25 mg by mouth 2 (two) times daily as needed., Disp: , Rfl:    midodrine (PROAMATINE) 10 MG tablet, Take by mouth., Disp: , Rfl:    mometasone (NASONEX) 50 MCG/ACT nasal spray, Place 2 sprays into the nose daily., Disp: , Rfl:    OLANZapine (ZYPREXA) 10 MG tablet, Take 1 tablet (10 mg total) by mouth at bedtime., Disp: 30 tablet, Rfl: 1   ondansetron (ZOFRAN) 8 MG tablet, Take 1 tablet (8 mg total) by mouth every 8 (eight) hours as needed for nausea, vomiting or refractory nausea / vomiting. Start on the third day after chemotherapy., Disp: 30 tablet, Rfl: 1   oxyCODONE (OXY IR/ROXICODONE) 5 MG immediate release tablet, Take 1 tablet (5 mg total) by mouth every 4 (four) hours as needed for severe pain., Disp: 60 tablet, Rfl: 0   pantoprazole (PROTONIX) 40 MG tablet, Take 40 mg by mouth daily. (Patient not taking: Reported on 06/13/2023), Disp: , Rfl:    polyethylene glycol (MIRALAX / GLYCOLAX) 17 g packet, Take by mouth. (Patient not taking: Reported on 06/13/2023), Disp: , Rfl:    promethazine (PHENERGAN) 25 MG tablet, Take 1 tablet (25 mg total) by mouth every 8 (eight) hours as needed for refractory nausea / vomiting. May cause sleepiness. (Patient not taking: Reported on 06/20/2023), Disp: 30 tablet, Rfl: 0   psyllium (METAMUCIL) 58.6 % powder, Take 1 packet by mouth 3 (three) times daily. (Patient not taking: Reported on 06/13/2023), Disp: , Rfl:    Saccharomyces boulardii (PROBIOTIC) 250 MG CAPS, Take 1 capsule by mouth daily. (Patient not taking: Reported on 06/13/2023), Disp: , Rfl:    simvastatin (ZOCOR) 20 MG tablet, Take 1 tablet by mouth at bedtime. (Patient not taking: Reported on 06/13/2023), Disp: , Rfl:    sucralfate (CARAFATE) 1 g tablet, Take 1 g by mouth 4 (four) times daily., Disp: , Rfl:    traZODone  (DESYREL) 50 MG tablet, Take 50 mg  by mouth at bedtime as needed., Disp: , Rfl:    Zinc Citrate-Phytase (ZYTAZE) 25-500 MG CAPS, Take 1 Dose by mouth daily. (Patient not taking: Reported on 06/13/2023), Disp: , Rfl:  No current facility-administered medications for this visit.  Facility-Administered Medications Ordered in Other Visits:    atropine injection 0.5 mg, 0.5 mg, Intravenous, Once PRN, Creig Hines, MD   fluorouracil (ADRUCIL) 3,500 mg in sodium chloride 0.9 % 80 mL chemo infusion, 2,000 mg/m2 (Order-Specific), Intravenous, 1 day or 1 dose, Creig Hines, MD   heparin lock flush 100 UNIT/ML injection, , , ,    irinotecan (CAMPTOSAR) 200 mg in sodium chloride 0.9 % 500 mL chemo infusion, 120 mg/m2 (Order-Specific), Intravenous, Once, Creig Hines, MD, Last Rate: 340 mL/hr at 06/27/23 1255, 200 mg at 06/27/23 1255   leucovorin 692 mg in sodium chloride 0.9 % 250 mL infusion, 400 mg/m2 (Order-Specific), Intravenous, Once, Creig Hines, MD  Physical exam:  Vitals:   06/27/23 1056  BP: 96/60  Pulse: 88  Resp: 18  Temp: 97.7 F (36.5 C)  TempSrc: Tympanic  SpO2: 100%  Weight: 150 lb 1.6 oz (68.1 kg)  Height: 5' (1.524 m)   Physical Exam Cardiovascular:     Rate and Rhythm: Regular rhythm. Tachycardia present.     Heart sounds: Normal heart sounds.  Pulmonary:     Effort: Pulmonary effort is normal.     Breath sounds: Normal breath sounds.  Abdominal:     General: Bowel sounds are normal.     Palpations: Abdomen is soft.  Skin:    General: Skin is warm and dry.  Neurological:     Mental Status: She is alert and oriented to person, place, and time.         Latest Ref Rng & Units 06/27/2023   10:13 AM  CMP  Glucose 70 - 99 mg/dL 595   BUN 8 - 23 mg/dL 12   Creatinine 6.38 - 1.00 mg/dL 7.56   Sodium 433 - 295 mmol/L 129   Potassium 3.5 - 5.1 mmol/L 3.7   Chloride 98 - 111 mmol/L 98   CO2 22 - 32 mmol/L 24   Calcium 8.9 - 10.3 mg/dL 7.9   Total Protein 6.5 -  8.1 g/dL 5.6   Total Bilirubin 0.3 - 1.2 mg/dL 1.1   Alkaline Phos 38 - 126 U/L 626   AST 15 - 41 U/L 203   ALT 0 - 44 U/L 150       Latest Ref Rng & Units 06/27/2023   10:13 AM  CBC  WBC 4.0 - 10.5 K/uL 6.8   Hemoglobin 12.0 - 15.0 g/dL 7.6   Hematocrit 18.8 - 46.0 % 23.8   Platelets 150 - 400 K/uL 268     No images are attached to the encounter.  CT ABDOMINAL MASS BIOPSY  Result Date: 06/02/2023 INDICATION: Primary malignant neuroendocrine tumor of esophagus, metastatic lymphadenopathy EXAM: CT-guided core needle biopsy of retroperitoneal lymphadenopathy MEDICATIONS: None. ANESTHESIA/SEDATION: Moderate (conscious) sedation was employed during this procedure. A total of Versed 1.5 mg and Fentanyl 75 mcg was administered intravenously. Moderate Sedation Time: 12 minutes. The patient's level of consciousness and vital signs were monitored continuously by radiology nursing throughout the procedure under my direct supervision. FLUOROSCOPY TIME:  N/a COMPLICATIONS: None immediate. PROCEDURE: Informed written consent was obtained from the patient after a thorough discussion of the procedural risks, benefits and alternatives. All questions were addressed. Maximal Sterile Barrier Technique was utilized including caps,  mask, sterile gowns, sterile gloves, sterile drape, hand hygiene and skin antiseptic. A timeout was performed prior to the initiation of the procedure. The patient was placed prone on the exam table. Limited CT of the abdomen and pelvis was performed for planning purposes. This demonstrated retroperitoneal lymphadenopathy, with a attention turned to left para-aortic conglomerate lymph nodes. Note was made of the position of the left renal artery. Skin entry site was marked, and the overlying skin was prepped and draped in the standard sterile fashion. Local analgesia was obtained with 1% lidocaine. Using intermittent CT fluoroscopy, a 17 gauge introducer needle was advanced towards the  identified lesion. Subsequently, core needle biopsy was performed using an 18 gauge core biopsy device x6 total passes. Specimens were submitted in formalin to pathology for further handling. Limited postprocedure imaging demonstrated no complicating feature. The patient tolerated the procedure well, and was transferred to recovery in stable condition. IMPRESSION: Successful CT-guided core needle biopsy of left retroperitoneal/para-aortic lymphadenopathy. Electronically Signed   By: Olive Bass M.D.   On: 06/02/2023 10:46     Assessment and plan- Patient is a 62 y.o. female with stage IV adenocarcinoma of the esophagus here for on treatment assessment prior to cycle 2 of palliative FOLFIRI chemotherapy  Patient tolerated cycle 1 of FOLFIRI chemotherapy poorly as evidenced by significant fatigue hyponatremia requiring IV hydration as well as worsening anemia.  She is now 3 weeks out of her cycle 1 but hemoglobin has trended down further and is at 7.9.  LFTs are also elevated today with an AST and ALT of 203 and 150 respectively although total bilirubin is normal at 1.1.  I am eliminating 5-FU bolus and reducing infusional 5-FU dosage to 2000 mg/m.  Also reducing the dose of irinotecan from 150 to 1.3 mg/m and see if she can tolerate this dosing better.  Since we will plan to do this treatment every 3 weeks instead of every 2 weeks for better tolerance.  Her white count is likely to recover in 3 weeks and therefore I am not giving her Neulasta with this treatment.  If she were to develop neutropenic fever or were to have neutropenia in 3 weeks from now we will consider adding a Udenyca to her treatment regimen.  I will to give her 1 unit of PRBC transfusion this week.  Labs and possible transfusion next week  Neoplasm related pain: Continue fentanyl patches and as needed oxycodone   Visit Diagnosis 1. 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 Encounter for antineoplastic chemotherapy    3. Antineoplastic chemotherapy induced anemia   4. Abnormal LFTs      Dr. Owens Shark, MD, MPH [facility] [phone] 06/27/2023 1:35 PM

## 2023-06-27 NOTE — Patient Instructions (Signed)
[facility]  Discharge Instructions: Thank you for choosing [facility] to provide your oncology and hematology care.  If you have a lab appointment with the Cancer Center, please go directly to the Cancer Center and check in at the registration area.  Wear comfortable clothing and clothing appropriate for easy access to any Portacath or PICC line.   We strive to give you quality time with your provider. You may need to reschedule your appointment if you arrive late (15 or more minutes).  Arriving late affects you and other patients whose appointments are after yours.  Also, if you miss three or more appointments without notifying the office, you may be dismissed from the clinic at the provider's discretion.      For prescription refill requests, have your pharmacy contact our office and allow 72 hours for refills to be completed.    Today you received the following chemotherapy and/or immunotherapy agents- irinotecan, leucovorin, 5FU      To help prevent nausea and vomiting after your treatment, we encourage you to take your nausea medication as directed.  BELOW ARE SYMPTOMS THAT SHOULD BE REPORTED IMMEDIATELY: *FEVER GREATER THAN 100.4 F (38 C) OR HIGHER *CHILLS OR SWEATING *NAUSEA AND VOMITING THAT IS NOT CONTROLLED WITH YOUR NAUSEA MEDICATION *UNUSUAL SHORTNESS OF BREATH *UNUSUAL BRUISING OR BLEEDING *URINARY PROBLEMS (pain or burning when urinating, or frequent urination) *BOWEL PROBLEMS (unusual diarrhea, constipation, pain near the anus) TENDERNESS IN MOUTH AND THROAT WITH OR WITHOUT PRESENCE OF ULCERS (sore throat, sores in mouth, or a toothache) UNUSUAL RASH, SWELLING OR PAIN  UNUSUAL VAGINAL DISCHARGE OR ITCHING   Items with * indicate a potential emergency and should be followed up as soon as possible or go to the Emergency Department if any problems should occur.  Please show the CHEMOTHERAPY ALERT CARD or IMMUNOTHERAPY ALERT  CARD at check-in to the Emergency Department and triage nurse.  Should you have questions after your visit or need to cancel or reschedule your appointment, please contact [facility]  [phone] and follow the prompts.  Office hours are 8:00 a.m. to 4:30 p.m. Monday - Friday. Please note that voicemails left after 4:00 p.m. may not be returned until the following business day.  We are closed weekends and major holidays. You have access to a nurse at all times for urgent questions. Please call the main number to the clinic [phone] and follow the prompts.  For any non-urgent questions, you may also contact your provider using MyChart. We now offer e-Visits for anyone 16 and older to request care online for non-urgent symptoms. For details visit [email or web address].   Also download the MyChart app! Go to the app store, search "MyChart", open the app, select [facility], and log in with your MyChart username and password.

## 2023-06-28 ENCOUNTER — Other Ambulatory Visit: Payer: Self-pay

## 2023-06-29 ENCOUNTER — Inpatient Hospital Stay: Payer: BC Managed Care – PPO

## 2023-06-29 DIAGNOSIS — Z5111 Encounter for antineoplastic chemotherapy: Secondary | ICD-10-CM | POA: Diagnosis not present

## 2023-06-29 DIAGNOSIS — C155 Malignant neoplasm of lower third of esophagus: Secondary | ICD-10-CM

## 2023-06-29 DIAGNOSIS — C7A8 Other malignant neuroendocrine tumors: Secondary | ICD-10-CM

## 2023-06-29 DIAGNOSIS — T451X5A Adverse effect of antineoplastic and immunosuppressive drugs, initial encounter: Secondary | ICD-10-CM

## 2023-06-29 MED ORDER — ACETAMINOPHEN 325 MG PO TABS
650.0000 mg | ORAL_TABLET | Freq: Once | ORAL | Status: AC
  Administered 2023-06-29: 650 mg via ORAL
  Filled 2023-06-29: qty 2

## 2023-06-29 MED ORDER — SODIUM CHLORIDE 0.9% IV SOLUTION
250.0000 mL | Freq: Once | INTRAVENOUS | Status: AC
  Administered 2023-06-29: 100 mL via INTRAVENOUS
  Filled 2023-06-29: qty 250

## 2023-06-29 MED ORDER — HEPARIN SOD (PORK) LOCK FLUSH 100 UNIT/ML IV SOLN
500.0000 [IU] | Freq: Once | INTRAVENOUS | Status: AC
  Administered 2023-06-29: 500 [IU] via INTRAVENOUS
  Filled 2023-06-29: qty 5

## 2023-06-30 ENCOUNTER — Other Ambulatory Visit: Payer: Self-pay | Admitting: *Deleted

## 2023-06-30 ENCOUNTER — Inpatient Hospital Stay (HOSPITAL_BASED_OUTPATIENT_CLINIC_OR_DEPARTMENT_OTHER): Payer: BC Managed Care – PPO | Admitting: Hospice and Palliative Medicine

## 2023-06-30 ENCOUNTER — Telehealth: Payer: Self-pay | Admitting: *Deleted

## 2023-06-30 ENCOUNTER — Other Ambulatory Visit: Payer: Self-pay | Admitting: Oncology

## 2023-06-30 ENCOUNTER — Ambulatory Visit
Admission: RE | Admit: 2023-06-30 | Discharge: 2023-06-30 | Disposition: A | Discharge status: Home / Self Care | Payer: BC Managed Care – PPO | Attending: Hospice and Palliative Medicine | Admitting: Hospice and Palliative Medicine

## 2023-06-30 ENCOUNTER — Ambulatory Visit
Admission: RE | Admit: 2023-06-30 | Discharge: 2023-06-30 | Disposition: A | Discharge status: Home / Self Care | Payer: BC Managed Care – PPO | Source: Ambulatory Visit | Attending: Hospice and Palliative Medicine

## 2023-06-30 DIAGNOSIS — R112 Nausea with vomiting, unspecified: Secondary | ICD-10-CM | POA: Diagnosis not present

## 2023-06-30 DIAGNOSIS — K5900 Constipation, unspecified: Secondary | ICD-10-CM

## 2023-06-30 LAB — TYPE AND SCREEN
ABO/RH(D): B POS
Antibody Screen: NEGATIVE
Unit division: 0

## 2023-06-30 LAB — BPAM RBC
Blood Product Expiration Date: 202411012359
ISSUE DATE / TIME: 202410171337
Unit Type and Rh: 5100

## 2023-06-30 MED ORDER — LACTULOSE 10 GM/15ML PO SOLN: 10.0000 g | Freq: Three times a day (TID) | ORAL | 3 refills | Status: DC | PRN

## 2023-06-30 MED ORDER — LACTULOSE 10 GM/15ML PO SOLN: 10.0000 g | Freq: Three times a day (TID) | ORAL | Status: DC | PRN

## 2023-06-30 MED ORDER — FENTANYL 25 MCG/HR TD PT72: 1.0000 | MEDICATED_PATCH | TRANSDERMAL | 0 refills | Status: DC

## 2023-06-30 NOTE — Telephone Encounter (Signed)
Can you please call her

## 2023-06-30 NOTE — Telephone Encounter (Signed)
Patient called reporting that she is having severe constipation and that her abdominal is bloated and hard. She states she has taken the medicine prescribed ( Lactulose twice a day) for it and it is not working and asks what she is to do. Her last bowel movement was 3 days ago and it was hard balls just a small amount. Last god bowel movement was a week ago. Please advise.

## 2023-06-30 NOTE — Telephone Encounter (Signed)
Pt added for telephone call-

## 2023-06-30 NOTE — Progress Notes (Signed)
Virtual Visit via Telephone Note  I connected with Ariana Wilson on 06/30/23 at 11:30 AM EDT by telephone and verified that I am speaking with the correct person using two identifiers.  Location: Patient: Home Provider: Clinic   I discussed the limitations, risks, security and privacy concerns of performing an evaluation and management service by telephone and the availability of in person appointments. I also discussed with the patient that there may be a patient responsible charge related to this service. The patient expressed understanding and agreed to proceed.   History of Present Illness: Ariana Wilson is a 62 y.o. 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  She was diagnosed with cancer in June 2023.    Observations/Objective: Patient is on palliative FOLFIRI chemotherapy.  Last saw Dr. Smith Robert on 06/27/2023 with decision to dose reduce FOLFIRI and switch from every 2-week to every 3-week dosing.  I spoke with patient today to address constipation.  Patient says that she has been constipated x 1 week.  Last good bowel movement was 6 days ago.  She had a small hard stool 3 days ago but none since.  She feels bloated and reports abdominal distention.  Denies significant pain.  Has had some nausea and occasional vomiting.  Passing little gas.  Patient says that she has been taking lactulose but only intermittently.  Mostly has been taking Colace at night and Metamucil.  Previously tried mag citrate but did not find it helpful.  She is on transdermal fentanyl.  Has not required oxycodone for breakthrough pain recently.  Assessment and Plan: Constipation -suspect that she is severely constipated.  However, given report of abdominal distention and nausea and vomiting, will send her for abdominal x-ray to rule out obstructive bowel gas pattern.  If negative, recommended liberalized dosing of lactulose 15 to 30 mL 2-3 times daily.   Could also try Dulcolax suppository.  We could also trial Linzess versus Movantik. Discussed importance of adhering to consistent bowel regimen with target of soft stools daily or every other day.  Also discussed ED triggers with patient going into the weekend.   Follow Up Instructions: RTC next week   I discussed the assessment and treatment plan with the patient. The patient was provided an opportunity to ask questions and all were answered. The patient agreed with the plan and demonstrated an understanding of the instructions.   The patient was advised to call back or seek an in-person evaluation if the symptoms worsen or if the condition fails to improve as anticipated.  I provided 10 minutes of non-face-to-face time during this encounter.   Malachy Moan, NP

## 2023-07-01 ENCOUNTER — Emergency Department: Payer: BC Managed Care – PPO

## 2023-07-01 ENCOUNTER — Other Ambulatory Visit: Payer: Self-pay

## 2023-07-01 DIAGNOSIS — N390 Urinary tract infection, site not specified: Secondary | ICD-10-CM | POA: Insufficient documentation

## 2023-07-01 DIAGNOSIS — K5900 Constipation, unspecified: Secondary | ICD-10-CM | POA: Insufficient documentation

## 2023-07-01 DIAGNOSIS — C7A1 Malignant poorly differentiated neuroendocrine tumors: Secondary | ICD-10-CM | POA: Insufficient documentation

## 2023-07-01 DIAGNOSIS — C160 Malignant neoplasm of cardia: Secondary | ICD-10-CM | POA: Diagnosis not present

## 2023-07-01 LAB — COMPREHENSIVE METABOLIC PANEL
ALT: 248 U/L — ABNORMAL HIGH (ref 0–44)
AST: 292 U/L — ABNORMAL HIGH (ref 15–41)
Albumin: 2.8 g/dL — ABNORMAL LOW (ref 3.5–5.0)
Alkaline Phosphatase: 976 U/L — ABNORMAL HIGH (ref 38–126)
Anion gap: 9 (ref 5–15)
BUN: 16 mg/dL (ref 8–23)
CO2: 22 mmol/L (ref 22–32)
Calcium: 8.3 mg/dL — ABNORMAL LOW (ref 8.9–10.3)
Chloride: 97 mmol/L — ABNORMAL LOW (ref 98–111)
Creatinine, Ser: 0.54 mg/dL (ref 0.44–1.00)
GFR, Estimated: >60 mL/min (ref >60)
Glucose, Bld: 171 mg/dL — ABNORMAL HIGH (ref 70–99)
Potassium: 4.1 mmol/L (ref 3.5–5.1)
Sodium: 128 mmol/L — ABNORMAL LOW (ref 135–145)
Total Bilirubin: 5.9 mg/dL — ABNORMAL HIGH (ref 0.3–1.2)
Total Protein: 6.4 g/dL — ABNORMAL LOW (ref 6.5–8.1)

## 2023-07-01 LAB — CBC
HCT: 30.1 % — ABNORMAL LOW (ref 36.0–46.0)
Hemoglobin: 9.8 g/dL — ABNORMAL LOW (ref 12.0–15.0)
MCH: 30.7 pg (ref 26.0–34.0)
MCHC: 32.6 g/dL (ref 30.0–36.0)
MCV: 94.4 fL (ref 80.0–100.0)
Platelets: 154 10*3/uL (ref 150–400)
RBC: 3.19 MIL/uL — ABNORMAL LOW (ref 3.87–5.11)
RDW: 17.6 % — ABNORMAL HIGH (ref 11.5–15.5)
WBC: 7.7 10*3/uL (ref 4.0–10.5)
nRBC: 0 % (ref 0.0–0.2)

## 2023-07-01 LAB — URINALYSIS, ROUTINE W REFLEX MICROSCOPIC
Bilirubin Urine: NEGATIVE
Glucose, UA: NEGATIVE mg/dL
Ketones, ur: NEGATIVE mg/dL
Nitrite: NEGATIVE
Protein, ur: NEGATIVE mg/dL
Specific Gravity, Urine: 1.035 — ABNORMAL HIGH (ref 1.005–1.030)
Squamous Epithelial / HPF: 0 /[HPF] (ref 0–5)
pH: 5 (ref 5.0–8.0)

## 2023-07-01 LAB — LIPASE, BLOOD: Lipase: 93 U/L — ABNORMAL HIGH (ref 11–51)

## 2023-07-01 MED ORDER — IOHEXOL 300 MG/ML  SOLN
100.0000 mL | Freq: Once | INTRAMUSCULAR | Status: AC | PRN
  Administered 2023-07-01: 100 mL via INTRAVENOUS

## 2023-07-01 NOTE — ED Triage Notes (Signed)
C/O constipation.  NO BM x 6 days.  Currently being treated for GEJ cancer and receiving Chemotherapy.  Also taking fentanyl patch changes Q 3 days and Oxycodone prn for pain.  C/O decreased PO intake due to feeling full and abdominal distention.

## 2023-07-02 ENCOUNTER — Emergency Department
Admission: EM | Admit: 2023-07-02 | Discharge: 2023-07-02 | Disposition: A | Discharge status: Home / Self Care | Payer: BC Managed Care – PPO | Attending: Emergency Medicine | Admitting: Emergency Medicine

## 2023-07-02 DIAGNOSIS — N390 Urinary tract infection, site not specified: Secondary | ICD-10-CM

## 2023-07-02 DIAGNOSIS — K5900 Constipation, unspecified: Secondary | ICD-10-CM

## 2023-07-02 MED ORDER — CEPHALEXIN 500 MG PO CAPS: 500.0000 mg | ORAL_CAPSULE | Freq: Three times a day (TID) | ORAL | 0 refills | Status: DC

## 2023-07-02 MED ORDER — FENTANYL CITRATE PF 50 MCG/ML IJ SOSY
50.0000 ug | PREFILLED_SYRINGE | Freq: Once | INTRAMUSCULAR | Status: AC
  Administered 2023-07-02: 50 ug via INTRAVENOUS
  Filled 2023-07-02: qty 1

## 2023-07-02 MED ORDER — CEPHALEXIN 500 MG PO CAPS
500.0000 mg | ORAL_CAPSULE | Freq: Once | ORAL | Status: AC
  Administered 2023-07-02: 500 mg via ORAL
  Filled 2023-07-02: qty 1

## 2023-07-02 NOTE — ED Provider Notes (Signed)
[facility] Provider Note    Event Date/Time   First MD Initiated Contact with Patient 07/02/23 0125     (approximate)   History   Constipation   HPI  Ariana Wilson is a 62 y.o. female who presents to the ED for evaluation of Constipation   I review a virtual oncology visit from 10/18.  Seen for constipation.  She has a history of stage IV large cell neuroendocrine tumor at her GE junction.  Receiving palliative chemotherapy, recently decided to increase spacing between dosing.  Patient presents to the ED for constipation.  Only 1 bowel movement in the past week.   Physical Exam   Triage Vital Signs: ED Triage Vitals  Encounter Vitals Group     BP 07/01/23 1545 121/74     Systolic BP Percentile --      Diastolic BP Percentile --      Pulse Rate 07/01/23 1545 (!) 105     Resp 07/01/23 1545 16     Temp 07/01/23 1545 98.4 F (36.9 C)     Temp Source 07/01/23 1545 Oral     SpO2 07/01/23 1545 95 %     Weight 07/01/23 1546 149 lb 14.6 oz (68 kg)     Height 07/01/23 1546 5' (1.524 m)     Head Circumference --      Peak Flow --      Pain Score 07/01/23 1545 4     Pain Loc --      Pain Education --      Exclude from Growth Chart --     Most recent vital signs: Vitals:   07/01/23 1858 07/02/23 0103  BP: 125/84 106/65  Pulse: (!) 104 74  Resp: 16 19  Temp: 97.9 F (36.6 C) 98.7 F (37.1 C)  SpO2: 95% 90%    General: Awake, no distress.  CV:  Good peripheral perfusion.  Resp:  Normal effort.  Abd:  Mild distention without being tense. MSK:  No deformity noted.  Neuro:  No focal deficits appreciated. Other:     ED Results / Procedures / Treatments   Labs (all labs ordered are listed, but only abnormal results are displayed) Labs Reviewed  LIPASE, BLOOD - Abnormal; Notable for the following components:      Result Value   Lipase 93 (*)    All other components within normal limits  COMPREHENSIVE METABOLIC PANEL - Abnormal; Notable  for the following components:   Sodium 128 (*)    Chloride 97 (*)    Glucose, Bld 171 (*)    Calcium 8.3 (*)    Total Protein 6.4 (*)    Albumin 2.8 (*)    AST 292 (*)    ALT 248 (*)    Alkaline Phosphatase 976 (*)    Total Bilirubin 5.9 (*)    All other components within normal limits  CBC - Abnormal; Notable for the following components:   RBC 3.19 (*)    Hemoglobin 9.8 (*)    HCT 30.1 (*)    RDW 17.6 (*)    All other components within normal limits  URINALYSIS, ROUTINE W REFLEX MICROSCOPIC - Abnormal; Notable for the following components:   Color, Urine AMBER (*)    APPearance HAZY (*)    Specific Gravity, Urine 1.035 (*)    Hgb urine dipstick MODERATE (*)    Leukocytes,Ua MODERATE (*)    Bacteria, UA MANY (*)    All other components within normal limits  URINE CULTURE    EKG   RADIOLOGY CT with constipation but no SBO  Official radiology report(s): CT ABDOMEN PELVIS W CONTRAST  Result Date: 07/01/2023 CLINICAL DATA:  Cancer patient being treated with chemotherapy for GE junction neoplasm, presents with loss of appetite and increasing abdominal distension, abdominal pain and worsening constipation. 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 EXAM: CT ABDOMEN AND PELVIS WITH CONTRAST TECHNIQUE: Multidetector CT imaging of the abdomen and pelvis was performed using the standard protocol following bolus administration of intravenous contrast. RADIATION DOSE REDUCTION: This exam was performed according to the departmental dose-optimization program which includes automated exposure control, adjustment of the mA and/or kV according to patient size and/or use of iterative reconstruction technique. CONTRAST:  OMNIPAQUE IOHEXOL 300 MG/ML  SOLN COMPARISON:  There are multiple prior CTs. The 2 most recent do  not have available reports as they were performed at a [facility] facility. This includes a CT of the abdomen with contrast 05/18/2023 and CT abdomen and pelvis with IV contrast 01/12/2023. The most recent exam with an attached report is CT chest, abdomen and pelvis CT with contrast 08/02/2023. the patient's last PET-CT was 09/15/2022 and also does not have an attached report. FINDINGS: Lower chest: There is subsegmental atelectasis in both bases. There is a small left layering pleural effusion which has increased in volume since 05/18/2023. No right pleural effusion. A port catheter again terminates in the upper right atrium. The cardiac size is normal. Compared with 05/18/2023 there is increasing fold thickening in the distal esophagus and proximal stomach, increased luminal effacement. There is a periesophageal lymph node on 2:17 which is larger than previously now 1.6 x 1 cm, previously 1.1 x 0.4 cm (2:17). Hepatobiliary: There is a new 1.6 cm hypervascular lesion posteriorly in segment 7 on 2:23 concerning for metastasis. Another measuring 8 mm is noted in segment 4A on 2:18 and is also suspicious. The liver is mildly steatotic. No other mass enhancement is seen. There is cholelithiasis with the gallbladder contracted. There is increased intrahepatic and extrahepatic biliary prominence with the common bile duct now 9 mm. Etiology may be abutting gastrohepatic ligament and periportal adenopathy in the area contributing to this. Pancreas: No primary mass or ductal dilatation, but there is gastrohepatic ligament adenopathy just above the pancreatic neck which is inseparable from the pancreas in may potentially infiltrate the pancreas. Spleen: Somewhat heterogeneous enhancement in the medial spleen initially but uniform enhancement on the delayed images, probably due to perfusional differences or bolus timing. The spleen is not enlarged. Adrenals/Urinary Tract: There is a new solid 1.8 x 1.3 cm right adrenal genu mass  almost certainly metastatic. The left adrenal gland is not well seen due to adjacent bulky left para-aortic chain adenopathy and could also be tumor-involved. The kidneys concentrate and excrete contrast symmetrically with no mass enhancement. There is increased trace bilateral perinephric fluid. No hydronephrosis, urinary stones or bladder thickening. Stomach/Bowel: As above there is increased masslike fold thickening at the GE junction extending into the proximal stomach, but there is also increased moderate to severe irregular gastric antral wall thickening, which could be inflammatory or due to additional infiltrating disease. Furthermore the stomach is increasingly fluid distended proximal to this. No small bowel obstruction is seen. An appendix is not seen in this patient. Large volume of retained stool is increased over priors. No bowel  wall thickening. Vascular/Lymphatic: Multifocal bulky adenopathy. There is a conglomerate gastrohepatic ligament mass measuring 8.1 x 5.8 cm on 2:32, previously 7.5 x 4.2 cm. This mass completely encases, and narrows the main hepatic artery and splenic artery and encases and severely narrows the proximal hepatic portal vein. There are additional portacaval lymph nodes, largest of these is 2.2 x 1.6 cm on 2:34, slightly larger than previously. There is worsened retroperitoneal lymphadenopathy. Bulky left periaortic chain nodes are noted with the largest of these measuring 3.8 x 2.7 cm on 2:31, on the last CT was 3 x 1.9 cm, with multiple others in this chain abutting and narrowing the left renal vein. Multiple enlarged aortocaval lymph nodes are also noted, for example on 2:39 measuring 2.5 x 1.3 cm, previously 2.1 x 1.2 cm. Some of these lymph nodes partially efface the IVC lumen. No pelvic or bulky mesenteric adenopathy is seen. Reproductive: Status post hysterectomy. No adnexal masses. Other: Since 05/18/2023, large-volume abdominal and pelvic ascites has developed in the  abdomen and pelvis with scattered fluid in the mesenteric folds. There is no free air, localizing collection or hemorrhage. There is a new mesenteric lymph node in the transverse mesocolon measuring 2.0 x 1.4 cm on 2:45. Scattered omental varices. No hernias. Increased mild body wall anasarca and mesenteric congestive changes. Musculoskeletal: No regional bone metastasis is seen. IMPRESSION: 1. Increasing masslike fold thickening at the GE junction extending into the proximal stomach, with increased moderate to severe irregular gastric antral wall thickening, the latter which could be inflammatory or due to additional infiltrating disease. 2. Increasing fluid distended stomach proximal to the antral fold thickening. 3. Increased size of bulky gastrohepatic ligament adenopathy which completely encases and narrows the main hepatic artery and splenic artery, and encases and severely narrows the proximal hepatic portal vein. 4. Increased size of a pre-esophageal lymph node, and increased size of portacaval and aortocaval lymph nodes. 5. 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 6. Two new hypervascular liver lesions concerning for metastases. 7. New large-volume abdominal and pelvic ascites with increased omental varices. 8. Increased intrahepatic and extrahepatic biliary dilatation, etiology may be abutting gastrohepatic ligament and periportal adenopathy in the area. 9. Increased mild body wall anasarca and mesenteric congestive changes. 10. Increased small left pleural effusion. 11. Constipation. 12. The left adrenal gland is not well seen due to adjacent bulky left para-aortic chain adenopathy and could also be tumor-involved. Electronically Signed   By: Almira Bar M.D.   On: 07/01/2023 21:50    PROCEDURES and INTERVENTIONS:  Fecal disimpaction  Date/Time: 07/02/2023 4:13 AM  Performed by: Delton Prairie, MD Authorized by: Delton Prairie, MD  Consent: Verbal consent  obtained. Risks and benefits: risks, benefits and alternatives were discussed Consent given by: patient and spouse Local anesthesia used: no  Anesthesia: Local anesthesia used: no  Sedation: Patient sedated: no  Patient tolerance: patient tolerated the procedure well with no immediate complications Comments: Digital disimpaction with hard stool in the rectal vault removed by me     Medications  iohexol (OMNIPAQUE) 300 MG/ML solution 100 mL (100 mLs Intravenous Contrast Given 07/01/23 1817)  cephALEXin (KEFLEX) capsule 500 mg (500 mg Oral Given 07/02/23 0154)  fentaNYL (SUBLIMAZE) injection 50 mcg (50 mcg Intravenous Given 07/02/23 0154)     IMPRESSION / MDM / ASSESSMENT AND PLAN / ED COURSE  I reviewed the triage vital signs and the nursing notes.  Differential diagnosis includes, but is not limited to, SBO, UTI, dehydration,  sepsis  {Patient presents with symptoms of an acute illness or injury that is potentially life-threatening.  Patient presents with constipation and fecal impaction suitable for outpatient management.  Found to have evidence of acute cystitis without sepsis.  Disimpacted.  CT with progression of her known cancer.  She is suitable for outpatient management, although I did consider admission.  Clinical Course as of 07/02/23 0413  Wynelle Link Jul 02, 2023  0202 Disimpacted, well tolerated [DS]    Clinical Course User Index [DS] Delton Prairie, MD     FINAL CLINICAL IMPRESSION(S) / ED DIAGNOSES   Final diagnoses:  Constipation, unspecified constipation type  Lower urinary tract infectious disease     Rx / DC Orders   ED Discharge Orders          Ordered    cephALEXin (KEFLEX) 500 MG capsule  3 times daily        07/02/23 0203             Note:  This document was prepared using Dragon voice recognition software and may include unintentional dictation errors.   Delton Prairie, MD 07/02/23 [phone]

## 2023-07-02 NOTE — Discharge Instructions (Signed)
Use MiraLAX, or its generic equivalent, for your constipation.  Mix this white powder in your favorite noncarbonated drink, such as milk, water or juice.  To begin with and to get things moving, use 2 capfuls up to twice per day.  Once you are passing more regular bowel movements, cut back to about 1 capful once or twice a day.  It is safe to use suppositories or enemas with this medication.  Please stay hydrated and drink plenty of fluids while you are taking this medication.  

## 2023-07-03 ENCOUNTER — Encounter: Payer: Self-pay | Admitting: Oncology

## 2023-07-03 ENCOUNTER — Other Ambulatory Visit: Payer: Self-pay | Admitting: Hospice and Palliative Medicine

## 2023-07-03 ENCOUNTER — Telehealth: Payer: Self-pay | Admitting: Hospice and Palliative Medicine

## 2023-07-03 ENCOUNTER — Other Ambulatory Visit: Payer: Self-pay | Admitting: *Deleted

## 2023-07-03 DIAGNOSIS — R188 Other ascites: Secondary | ICD-10-CM

## 2023-07-03 DIAGNOSIS — C7A8 Other malignant neuroendocrine tumors: Secondary | ICD-10-CM

## 2023-07-03 NOTE — Telephone Encounter (Signed)
I called patient to check on her.  She was seen over the weekend in the ED and disimpacted.  Overall, feels better and less constipated.  Has been liberally taking MiraLAX.  Does continue to feel "bloated".  CT showed evidence of disease progression with large volume ascites.  Discussed option for therapeutic paracentesis and patient interested.  Patient aware that fluid may reaccumulate over time.  Will benefit from further discussion regarding goals of care in light of cancer progression.  Case and plan discussed with Dr. Smith Robert.  Patient will RTC tomorrow.

## 2023-07-04 ENCOUNTER — Inpatient Hospital Stay: Payer: BC Managed Care – PPO

## 2023-07-04 ENCOUNTER — Other Ambulatory Visit: Payer: Self-pay | Admitting: Oncology

## 2023-07-04 ENCOUNTER — Ambulatory Visit
Admission: RE | Admit: 2023-07-04 | Discharge: 2023-07-04 | Discharge status: Home / Self Care | Payer: BC Managed Care – PPO | Source: Ambulatory Visit | Attending: Hospice and Palliative Medicine

## 2023-07-04 ENCOUNTER — Other Ambulatory Visit: Payer: Self-pay | Admitting: *Deleted

## 2023-07-04 ENCOUNTER — Inpatient Hospital Stay (HOSPITAL_BASED_OUTPATIENT_CLINIC_OR_DEPARTMENT_OTHER): Payer: BC Managed Care – PPO | Admitting: Nurse Practitioner

## 2023-07-04 ENCOUNTER — Encounter: Payer: Self-pay | Admitting: Nurse Practitioner

## 2023-07-04 VITALS — BP 132/54 | HR 84

## 2023-07-04 VITALS — BP 117/79 | HR 97 | Temp 97.5°F | Wt 143.0 lb

## 2023-07-04 DIAGNOSIS — Z5189 Encounter for other specified aftercare: Secondary | ICD-10-CM | POA: Diagnosis not present

## 2023-07-04 DIAGNOSIS — K5903 Drug induced constipation: Secondary | ICD-10-CM

## 2023-07-04 DIAGNOSIS — R42 Dizziness and giddiness: Secondary | ICD-10-CM

## 2023-07-04 DIAGNOSIS — R55 Syncope and collapse: Secondary | ICD-10-CM

## 2023-07-04 DIAGNOSIS — C7A8 Other malignant neuroendocrine tumors: Secondary | ICD-10-CM

## 2023-07-04 DIAGNOSIS — G893 Neoplasm related pain (acute) (chronic): Secondary | ICD-10-CM | POA: Diagnosis not present

## 2023-07-04 DIAGNOSIS — T402X5A Adverse effect of other opioids, initial encounter: Secondary | ICD-10-CM

## 2023-07-04 DIAGNOSIS — Z5111 Encounter for antineoplastic chemotherapy: Secondary | ICD-10-CM | POA: Diagnosis not present

## 2023-07-04 DIAGNOSIS — R188 Other ascites: Secondary | ICD-10-CM | POA: Insufficient documentation

## 2023-07-04 DIAGNOSIS — R180 Malignant ascites: Secondary | ICD-10-CM

## 2023-07-04 LAB — CBC WITH DIFFERENTIAL (CANCER CENTER ONLY)
Abs Immature Granulocytes: 0.04 10*3/uL (ref 0.00–0.07)
Basophils Absolute: 0 10*3/uL (ref 0.0–0.1)
Basophils Relative: 0 %
Eosinophils Absolute: 0 10*3/uL (ref 0.0–0.5)
Eosinophils Relative: 1 %
HCT: 29.9 % — ABNORMAL LOW (ref 36.0–46.0)
Hemoglobin: 9.6 g/dL — ABNORMAL LOW (ref 12.0–15.0)
Immature Granulocytes: 1 %
Lymphocytes Relative: 52 %
Lymphs Abs: 1.8 10*3/uL (ref 0.7–4.0)
MCH: 30.5 pg (ref 26.0–34.0)
MCHC: 32.1 g/dL (ref 30.0–36.0)
MCV: 94.9 fL (ref 80.0–100.0)
Monocytes Absolute: 0 10*3/uL — ABNORMAL LOW (ref 0.1–1.0)
Monocytes Relative: 1 %
Neutro Abs: 1.6 10*3/uL — ABNORMAL LOW (ref 1.7–7.7)
Neutrophils Relative %: 45 %
Platelet Count: 79 10*3/uL — ABNORMAL LOW (ref 150–400)
RBC: 3.15 MIL/uL — ABNORMAL LOW (ref 3.87–5.11)
RDW: 17.1 % — ABNORMAL HIGH (ref 11.5–15.5)
WBC Count: 3.5 10*3/uL — ABNORMAL LOW (ref 4.0–10.5)
nRBC: 0 % (ref 0.0–0.2)

## 2023-07-04 LAB — PROTEIN, PLEURAL OR PERITONEAL FLUID: Total protein, fluid: <3 g/dL

## 2023-07-04 LAB — BASIC METABOLIC PANEL - CANCER CENTER ONLY
Anion gap: 6 (ref 5–15)
BUN: 25 mg/dL — ABNORMAL HIGH (ref 8–23)
CO2: 25 mmol/L (ref 22–32)
Calcium: 8.2 mg/dL — ABNORMAL LOW (ref 8.9–10.3)
Chloride: 99 mmol/L (ref 98–111)
Creatinine: 0.86 mg/dL (ref 0.44–1.00)
GFR, Estimated: >60 mL/min (ref >60)
Glucose, Bld: 105 mg/dL — ABNORMAL HIGH (ref 70–99)
Potassium: 4.5 mmol/L (ref 3.5–5.1)
Sodium: 130 mmol/L — ABNORMAL LOW (ref 135–145)

## 2023-07-04 LAB — URINE CULTURE: Culture: >=100000 — AB

## 2023-07-04 LAB — LACTATE DEHYDROGENASE, PLEURAL OR PERITONEAL FLUID: LD, Fluid: 37 U/L — ABNORMAL HIGH (ref 3–23)

## 2023-07-04 LAB — SAMPLE TO BLOOD BANK

## 2023-07-04 MED ORDER — LIDOCAINE HCL (PF) 1 % IJ SOLN
10.0000 mL | Freq: Once | INTRAMUSCULAR | Status: AC
  Administered 2023-07-04: 10 mL via INTRADERMAL
  Filled 2023-07-04: qty 10

## 2023-07-04 MED ORDER — LIDOCAINE HCL (PF) 1 % IJ SOLN
10.0000 mL | Freq: Once | INTRAMUSCULAR | Status: AC
  Administered 2023-07-04: 10 mL via SUBCUTANEOUS
  Filled 2023-07-04: qty 10

## 2023-07-04 MED ORDER — FENTANYL 25 MCG/HR TD PT72: 1.0000 | MEDICATED_PATCH | TRANSDERMAL | 0 refills | Status: DC

## 2023-07-04 MED ORDER — SODIUM CHLORIDE 0.9% FLUSH
10.0000 mL | Freq: Once | INTRAVENOUS | Status: AC
Start: 2023-07-04 — End: 2023-07-04
  Administered 2023-07-04: 10 mL via INTRAVENOUS
  Filled 2023-07-04: qty 10

## 2023-07-04 MED ORDER — HEPARIN SOD (PORK) LOCK FLUSH 100 UNIT/ML IV SOLN
500.0000 [IU] | Freq: Once | INTRAVENOUS | Status: AC
Start: 2023-07-04 — End: 2023-07-04
  Administered 2023-07-04: 500 [IU] via INTRAVENOUS
  Filled 2023-07-04: qty 5

## 2023-07-04 MED ORDER — SODIUM CHLORIDE 0.9 % IV SOLN
INTRAVENOUS | Status: DC
Start: 2023-07-04 — End: 2023-07-04
  Filled 2023-07-04 (×2): qty 250

## 2023-07-04 NOTE — Patient Instructions (Signed)
Oral Rehydration Solution (ORS) Recipes  Option 1 - 4  cups (1 liter) water +  teaspoon table salt + 6 level teaspoons sugar (World Health Organization's ORS recipe) - Provides: 1150 mg sodium, 25 grams sugar  Option 2 - 2 cups Gatorade + 2 cups water +  teaspoon salt - Provides: 1361 mg sodium, 24 grams sugar, 62 mg K  Option 3 - 3 cups water + 1 cup orange juice +  teaspoon salt +  teaspoon baking soda - Provides: 2355 mg sodium, 16 grams sugar, 362 mg K  Option 4 -  cup grape juice or cranberry juice + 3  cups water +  teaspoon salt - Provides: 1156-1159 mg sodium, 9-11 grams sugar, 50-90 mg K  Option 5 - 1 cup apple juice + 3 cups water +  teaspoon salt - Provides: 1160 mg sodium, 20 grams sugar, 194 mg K  Start by sipping a low volume of the mixture, 500-750 mL/day to gain acceptance to the taste, learn how to sip between meals and throughout the day all while ensuring consumption does not worsen dehydration by increasing stool volume. If you are not able to tolerate oral rehydration, notify clinic for reevaluation.

## 2023-07-04 NOTE — Progress Notes (Signed)
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 Mia Creek on 03/01/2022 EGD showed a medium-sized ulcerating mass with no bleeding or stigmata of recent bleeding at the GE junction..  Mass was partially obstructing.  There is also mention of another ulcerated noncircumferential mass with oozing bleeding found at the GE junction with extension into the cardia.  Both these masses were biopsied.  Pathology was pending at the time of my visit with the patient and was reported to be a poorly differentiated carcinoma.  Patient is currently able to swallow soft foods without much difficulty.  However more solid foods occasionally feel stuck.    PET was denied by insurance.  CT chest abdomen and pelvis with contrast showed asymmetric distal esophageal wall thickening with thickening extending around the lesser curvature of the stomach through the gastric cardia and proximal fundus.  Paratracheal/paraesophageal, gastrohepatic and PET retroperitoneal adenopathy.  Left periaortic lymph node measuring 17 mm at the level of left renal hilum.   Final pathology showed there were 2 specimens collected.  Specimen a was poorly differentiated carcinoma with ulceration and necrosis.v verys weakly positive for synaptophysin 50% of the cells.  Cells negative for CK7 and CK23 for the BF1 CDX2 100 Melan-A HMB45 and CD20.  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  Pathology was also looked at but was signed out as poorly differentiated adenocarcinoma at [facility].  Patient did not have a good response to cisplatin etoposide Tecentriq regimen.  She was therefore switched to FOLFOX chemotherapy.  CPS score was 1 and therefore Keytruda was excluded.  HER2 negative.   Patient initially received 4 cycles of cisplatin and etoposide chemotherapy up until August 2023.  Scans did not show any significant response and but she was switched to FOLFOX chemotherapy starting September 2023.  She received 9 cycles of treatment up until December 2023.   She had CT scan as well as PET scan with the PET scan being done in January 2024 at [facility].PET CT scan showed marked interval improvement in the distal esophageal hypermetabolic activity compatible with treatment effect.  Resolution of hypermetabolic thoracoabdominal adenopathy and no new sites of metastatic disease.  Plan was to hold off on any further chemotherapy.  She did see Dr. Ewing Schlein at [facility] as well and underwent repeat EGD with biopsies which were negative for malignancy.  She is off treatment since December 2023   Patient underwent repeat staging scans on 05/18/2023 at [facility].  CT abdomen showed markedly worsening multistation adenopathy in the upper abdomen and retroperitoneum with masslike nodal conglomerate involving gastrohepatic and porta hepatis spaces.  New left adrenal metastases.  CT chest showed increased size of multiple mediastinal lymph nodes concerning for progressive metastatic disease.  Repeat biopsy has been performed for NGS testing  which is currently pending   Plan is to proceed with second line FOLFIRI chemotherapy.  Immunotherapy was not added as CPS score was less than  10  Interval history- Patient is 62 year old female with above history of adenocarcinoma of GE Junction, stage IV, s/p cycle 2 of FOLFIRI chemotherapy. She has not been able to tolerate oral rehydration at home. Upon transfer from wheelchair to recliner she became lightheaded and told staff and husband she was going to pass out. Symptoms improved with laying flat. She was seen in ER for constipation and disimpacted. Bowels are now soft and normal with miralax. Pain is well controlled with fentanyl patches and oxycodone. She requests refill of fentanyl. She has taken in very little by mouth over past few days. Her weight is down but abdomen feels fuller. She believes she needs fluid taken off.   ECOG PS- 3 Pain scale- 0 Opioid associated constipation- yes  Review of systems- Review of Systems  Constitutional:  Positive for malaise/fatigue and weight loss. Negative for chills and fever.       Loss of appetite. Minimal intake.   HENT:  Negative for congestion, ear discharge and nosebleeds.   Eyes:  Negative for blurred vision.  Respiratory:  Negative for cough, hemoptysis, sputum production, shortness of breath and wheezing.   Cardiovascular:  Negative for chest pain, palpitations, orthopnea and claudication.  Gastrointestinal:  Positive for abdominal pain and constipation. Negative for blood in stool, diarrhea, heartburn, melena, nausea and vomiting.  Genitourinary:  Negative for dysuria, flank pain, frequency, hematuria and urgency.  Musculoskeletal:  Negative for back pain, falls, joint pain and myalgias.  Skin:  Negative for rash.  Neurological:  Positive for weakness. Negative for dizziness, tingling, focal weakness, seizures and headaches.  Endo/Heme/Allergies:  Does not bruise/bleed easily.  Psychiatric/Behavioral:  Negative for depression and suicidal ideas. The patient is not nervous/anxious and does not have insomnia.       Allergies  Allergen Reactions   Sulfa Antibiotics Hives     Says her tongue swelled a bit   Sulfonamide Derivatives Swelling    Tongue swelling     Past Medical History:  Diagnosis Date   Anemia    Arthritis    Atrial fibrillation (HCC)    B12 deficiency    Cancer (HCC)    Dysrhythmia/history A. Fib    Hyperlipidemia    Hypothyroidism    Menopause    Osteoarthritis    Palpitations    Hx of, 25 years ago   Vertigo      Past Surgical History:  Procedure Laterality Date   BILATERAL SALPINGOOPHORECTOMY  2008   DILATION AND CURETTAGE OF UTERUS  08/24/2006   ESOPHAGOGASTRODUODENOSCOPY (EGD) WITH PROPOFOL N/A 03/01/2022   Procedure: ESOPHAGOGASTRODUODENOSCOPY (EGD) WITH PROPOFOL;  Surgeon: Regis Bill, MD;  Location: [facility] ENDOSCOPY;  Service: Endoscopy;  Laterality: N/A;   ESOPHAGOGASTRODUODENOSCOPY (EGD) WITH PROPOFOL N/A 05/04/2022   Procedure: ESOPHAGOGASTRODUODENOSCOPY (EGD) WITH PROPOFOL;  Surgeon: [name], MD;  Location: [facility] ENDOSCOPY;  Service: Gastroenterology;  Laterality: N/A;   ESOPHAGOGASTRODUODENOSCOPY (EGD) WITH PROPOFOL N/A 06/01/2023   Procedure: ESOPHAGOGASTRODUODENOSCOPY (EGD) WITH PROPOFOL;  Surgeon: [name], MD;  Location: [facility] ENDOSCOPY;  Service: Gastroenterology;  Laterality: N/A;   HEMOSTASIS CONTROL  06/01/2023   Procedure: HEMOSTASIS CONTROL;  Surgeon: [name], MD;  Location: [facility] ENDOSCOPY;  Service: Gastroenterology;;   incision tendon sheath for trigger finger Right    ring finger   LAPAROSCOPIC SUPRACERVICAL HYSTERECTOMY  2008   Dr. Arvil Chaco; with  BSO   NOSE SURGERY  03/2008   PORTA CATH INSERTION N/A 03/21/2022   Procedure: PORTA CATH INSERTION;  Surgeon: Annice Needy, MD;  Location: [facility] INVASIVE CV LAB;  Service: Cardiovascular;  Laterality: N/A;    Social History   Socioeconomic History   Marital status: Married    Spouse name: Not on file   Number of children: Not on file   Years of education: Not on file   Highest education level: Not on file  Occupational History    Occupation: Full time  Tobacco Use   Smoking status: Never   Smokeless tobacco: Never  Vaping Use   Vaping status: Never Used  Substance and Sexual Activity   Alcohol use: Never   Drug use: Never   Sexual activity: Not Currently    Birth control/protection: Surgical    Comment: Hysterectomy  Other Topics Concern   Not on file  Social History Narrative   Married with 2 children   Gets regular exercise   Social Determinants of Health   Financial Resource Strain: Low Risk  (04/18/2023)   Received from [facility]   Overall Financial Resource Strain (CARDIA)    Difficulty of Paying Living Expenses: Not hard at all  Food Insecurity: No Food Insecurity (04/18/2023)   Received from [facility]   Hunger Vital Sign    Worried About Running Out of Food in the Last Year: Never true    Ran Out of Food in the Last Year: Never true  Transportation Needs: No Transportation Needs (04/18/2023)   Received from [facility] - Transportation    In the past 12 months, has lack of transportation kept you from medical appointments or from getting medications?: No    Lack of Transportation (Non-Medical): No  Physical Activity: Inactive (04/20/2022)   Exercise Vital Sign    Days of Exercise per Week: 0 days    Minutes of Exercise per Session: 0 min  Stress: No Stress Concern Present (04/20/2022)   [employer] of Occupational Health - Occupational Stress Questionnaire    Feeling of Stress : Only a little  Social Connections: Socially Integrated (04/20/2022)   Social Connection and Isolation Panel [NHANES]    Frequency of Communication with Friends and Family: More than three times a week    Frequency of Social Gatherings with Friends and Family: More than three times a week    Attends Religious Services: More than 4 times per year    Active Member of [employer] or Organizations: Yes    Attends Engineer, structural: More than 4 times  per year    Marital Status: Married  Catering manager Violence: Not on file    Family History  Problem Relation Age of Onset   Hyperlipidemia Mother    Hypertension Mother    Stroke Mother    Heart disease Father    Hyperlipidemia Father    Atrial fibrillation Father    Melanoma Father    Arrhythmia Sister        Atrial fibrillation   Heart disease Sister    Atrial fibrillation Sister    Heart attack Brother    Breast cancer Neg Hx      Current Outpatient Medications:    cephALEXin (KEFLEX) 500 MG capsule, Take 1 capsule (500 mg total) by mouth 3 (three) times daily for 5 days., Disp: 15 capsule, Rfl: 0   Cobalamin Combinations (B-12 PLUS FOLIC ACID PO), , Disp: , Rfl:  cyanocobalamin (VITAMIN B12) 1000 MCG tablet, Take 1,000 mcg by mouth daily., Disp: , Rfl:    dexamethasone (DECADRON) 4 MG tablet, Take 2 tablets (8 mg total) by mouth daily. Start the day after chemotherapy for 2 days. Take with food., Disp: 8 tablet, Rfl: 5   dicyclomine (BENTYL) 20 MG tablet, Take 20 mg by mouth 2 (two) times daily., Disp: , Rfl:    diphenoxylate-atropine (LOMOTIL) 2.5-0.025 MG tablet, Take 2 tablets by mouth every 6 (six) hours as needed for diarrhea or loose stools., Disp: 120 tablet, Rfl: 0   etodolac (LODINE) 400 MG tablet, Take 400 mg by mouth 2 (two) times daily., Disp: , Rfl:    fentaNYL (DURAGESIC) 25 MCG/HR, Place 1 patch onto the skin every 3 (three) days., Disp: 10 patch, Rfl: 0   flecainide (TAMBOCOR) 50 MG tablet, Take 1 tablet by mouth 2 (two) times daily., Disp: , Rfl:    lactulose (CHRONULAC) 10 GM/15ML solution, Take 15-30 mLs (10-20 g total) by mouth 3 (three) times daily as needed for mild constipation., Disp: 240 mL, Rfl: 3   metoprolol tartrate (LOPRESSOR) 25 MG tablet, Take 25 mg by mouth 2 (two) times daily as needed., Disp: , Rfl:    midodrine (PROAMATINE) 10 MG tablet, Take by mouth., Disp: , Rfl:    mometasone (NASONEX) 50 MCG/ACT nasal spray, Place 2 sprays into the  nose daily., Disp: , Rfl:    OLANZapine (ZYPREXA) 10 MG tablet, Take 1 tablet (10 mg total) by mouth at bedtime., Disp: 30 tablet, Rfl: 1   ondansetron (ZOFRAN) 8 MG tablet, Take 1 tablet (8 mg total) by mouth every 8 (eight) hours as needed for nausea, vomiting or refractory nausea / vomiting. Start on the third day after chemotherapy., Disp: 30 tablet, Rfl: 1   oxyCODONE (OXY IR/ROXICODONE) 5 MG immediate release tablet, Take 1 tablet (5 mg total) by mouth every 4 (four) hours as needed for severe pain., Disp: 60 tablet, Rfl: 0   sucralfate (CARAFATE) 1 g tablet, Take 1 g by mouth 4 (four) times daily., Disp: , Rfl:    traZODone (DESYREL) 50 MG tablet, Take 50 mg by mouth at bedtime as needed., Disp: , Rfl:    Calcium Carbonate-Vitamin D 600-200 MG-UNIT TABS, Take 1 tablet by mouth daily.   (Patient not taking: Reported on 06/13/2023), Disp: , Rfl:    cetirizine (ZYRTEC) 10 MG tablet, Take 10 mg by mouth daily. (Patient not taking: Reported on 06/13/2023), Disp: , Rfl:    Cholecalciferol 25 MCG (1000 UT) tablet, Take 1,000 Units by mouth daily. (Patient not taking: Reported on 06/13/2023), Disp: , Rfl:    famotidine (PEPCID) 40 MG tablet, Take 1 tablet by mouth at bedtime. (Patient not taking: Reported on 06/13/2023), Disp: , Rfl:    lidocaine-prilocaine (EMLA) cream, Apply to affected area once (Patient not taking: Reported on 06/13/2023), Disp: 30 g, Rfl: 3   loperamide (IMODIUM) 2 MG capsule, Take 2 tabs by mouth with first loose stool, then 1 tab with each additional loose stool as needed. Do not exceed 8 tabs in a 24-hour period (Patient not taking: Reported on 05/29/2023), Disp: 60 capsule, Rfl: 2   Melatonin 10 MG TABS, Take by mouth. (Patient not taking: Reported on 06/20/2023), Disp: , Rfl:    pantoprazole (PROTONIX) 40 MG tablet, Take 40 mg by mouth daily. (Patient not taking: Reported on 06/13/2023), Disp: , Rfl:    polyethylene glycol (MIRALAX / GLYCOLAX) 17 g packet, Take by mouth. (Patient not  taking: Reported on 06/13/2023), Disp: , Rfl:    promethazine (PHENERGAN) 25 MG tablet, Take 1 tablet (25 mg total) by mouth every 8 (eight) hours as needed for refractory nausea / vomiting. May cause sleepiness. (Patient not taking: Reported on 06/20/2023), Disp: 30 tablet, Rfl: 0   psyllium (METAMUCIL) 58.6 % powder, Take 1 packet by mouth 3 (three) times daily. (Patient not taking: Reported on 06/13/2023), Disp: , Rfl:    Saccharomyces boulardii (PROBIOTIC) 250 MG CAPS, Take 1 capsule by mouth daily. (Patient not taking: Reported on 06/13/2023), Disp: , Rfl:    simvastatin (ZOCOR) 20 MG tablet, Take 1 tablet by mouth at bedtime. (Patient not taking: Reported on 06/13/2023), Disp: , Rfl:    Zinc Citrate-Phytase (ZYTAZE) 25-500 MG CAPS, Take 1 Dose by mouth daily. (Patient not taking: Reported on 06/13/2023), Disp: , Rfl:  No current facility-administered medications for this visit.  Facility-Administered Medications Ordered in Other Visits:    heparin lock flush 100 UNIT/ML injection, , , ,   Physical exam:  Vitals:   07/04/23 1125  BP: 117/79  Pulse: 97  Temp: (!) 97.5 F (36.4 C)  TempSrc: Tympanic  SpO2: 99%  Weight: 143 lb (64.9 kg)   Physical Exam Vitals reviewed.  Constitutional:      Appearance: She is ill-appearing.  HENT:     Mouth/Throat:     Mouth: Mucous membranes are dry.  Eyes:     General: No scleral icterus. Cardiovascular:     Rate and Rhythm: Regular rhythm. Tachycardia present.  Pulmonary:     Effort: Pulmonary effort is normal.     Breath sounds: Normal breath sounds.  Abdominal:     General: There is distension.     Palpations: Abdomen is soft.     Tenderness: There is no abdominal tenderness.  Skin:    General: Skin is warm and dry.     Coloration: Skin is pale.  Neurological:     Motor: Weakness present.     Comments: Appears distant  Psychiatric:        Behavior: Behavior is slowed. Behavior is cooperative.        Latest Ref Rng & Units  07/04/2023   11:02 AM  CMP  Glucose 70 - 99 mg/dL 841   BUN 8 - 23 mg/dL 25   Creatinine 3.24 - 1.00 mg/dL 4.01   Sodium 027 - 253 mmol/L 130   Potassium 3.5 - 5.1 mmol/L 4.5   Chloride 98 - 111 mmol/L 99   CO2 22 - 32 mmol/L 25   Calcium 8.9 - 10.3 mg/dL 8.2       Latest Ref Rng & Units 07/04/2023   11:02 AM  CBC  WBC 4.0 - 10.5 K/uL 3.5   Hemoglobin 12.0 - 15.0 g/dL 9.6   Hematocrit 66.4 - 46.0 % 29.9   Platelets 150 - 400 K/uL 79    No images are attached to the encounter.  CT ABDOMEN PELVIS W CONTRAST  Result Date: 07/01/2023 CLINICAL DATA:  Cancer patient being treated with chemotherapy for GE junction neoplasm, presents with loss of appetite and increasing abdominal distension, abdominal pain and worsening constipation. 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 EXAM: CT ABDOMEN AND PELVIS WITH CONTRAST TECHNIQUE: Multidetector CT imaging of the abdomen and pelvis was performed using the standard protocol following bolus administration of intravenous contrast. RADIATION DOSE  REDUCTION: This exam was performed according to the departmental dose-optimization program which includes automated exposure control, adjustment of the mA and/or kV according to patient size and/or use of iterative reconstruction technique. CONTRAST:  OMNIPAQUE IOHEXOL 300 MG/ML  SOLN COMPARISON:  There are multiple prior CTs. The 2 most recent do not have available reports as they were performed at a [facility] facility. This includes a CT of the abdomen with contrast 05/18/2023 and CT abdomen and pelvis with IV contrast 01/12/2023. The most recent exam with an attached report is CT chest, abdomen and pelvis CT with contrast 08/02/2023. the patient's last PET-CT was 09/15/2022 and also does not have an attached report. FINDINGS: Lower chest:  There is subsegmental atelectasis in both bases. There is a small left layering pleural effusion which has increased in volume since 05/18/2023. No right pleural effusion. A port catheter again terminates in the upper right atrium. The cardiac size is normal. Compared with 05/18/2023 there is increasing fold thickening in the distal esophagus and proximal stomach, increased luminal effacement. There is a periesophageal lymph node on 2:17 which is larger than previously now 1.6 x 1 cm, previously 1.1 x 0.4 cm (2:17). Hepatobiliary: There is a new 1.6 cm hypervascular lesion posteriorly in segment 7 on 2:23 concerning for metastasis. Another measuring 8 mm is noted in segment 4A on 2:18 and is also suspicious. The liver is mildly steatotic. No other mass enhancement is seen. There is cholelithiasis with the gallbladder contracted. There is increased intrahepatic and extrahepatic biliary prominence with the common bile duct now 9 mm. Etiology may be abutting gastrohepatic ligament and periportal adenopathy in the area contributing to this. Pancreas: No primary mass or ductal dilatation, but there is gastrohepatic ligament adenopathy just above the pancreatic neck which is inseparable from the pancreas in may potentially infiltrate the pancreas. Spleen: Somewhat heterogeneous enhancement in the medial spleen initially but uniform enhancement on the delayed images, probably due to perfusional differences or bolus timing. The spleen is not enlarged. Adrenals/Urinary Tract: There is a new solid 1.8 x 1.3 cm right adrenal genu mass almost certainly metastatic. The left adrenal gland is not well seen due to adjacent bulky left para-aortic chain adenopathy and could also be tumor-involved. The kidneys concentrate and excrete contrast symmetrically with no mass enhancement. There is increased trace bilateral perinephric fluid. No hydronephrosis, urinary stones or bladder thickening. Stomach/Bowel: As above there is increased  masslike fold thickening at the GE junction extending into the proximal stomach, but there is also increased moderate to severe irregular gastric antral wall thickening, which could be inflammatory or due to additional infiltrating disease. Furthermore the stomach is increasingly fluid distended proximal to this. No small bowel obstruction is seen. An appendix is not seen in this patient. Large volume of retained stool is increased over priors. No bowel wall thickening. Vascular/Lymphatic: Multifocal bulky adenopathy. There is a conglomerate gastrohepatic ligament mass measuring 8.1 x 5.8 cm on 2:32, previously 7.5 x 4.2 cm. This mass completely encases, and narrows the main hepatic artery and splenic artery and encases and severely narrows the proximal hepatic portal vein. There are additional portacaval lymph nodes, largest of these is 2.2 x 1.6 cm on 2:34, slightly larger than previously. There is worsened retroperitoneal lymphadenopathy. Bulky left periaortic chain nodes are noted with the largest of these measuring 3.8 x 2.7 cm on 2:31, on the last CT was 3 x 1.9 cm, with multiple others in this chain abutting and narrowing the left renal vein. Multiple  enlarged aortocaval lymph nodes are also noted, for example on 2:39 measuring 2.5 x 1.3 cm, previously 2.1 x 1.2 cm. Some of these lymph nodes partially efface the IVC lumen. No pelvic or bulky mesenteric adenopathy is seen. Reproductive: Status post hysterectomy. No adnexal masses. Other: Since 05/18/2023, large-volume abdominal and pelvic ascites has developed in the abdomen and pelvis with scattered fluid in the mesenteric folds. There is no free air, localizing collection or hemorrhage. There is a new mesenteric lymph node in the transverse mesocolon measuring 2.0 x 1.4 cm on 2:45. Scattered omental varices. No hernias. Increased mild body wall anasarca and mesenteric congestive changes. Musculoskeletal: No regional bone metastasis is seen. IMPRESSION: 1.  Increasing masslike fold thickening at the GE junction extending into the proximal stomach, with increased moderate to severe irregular gastric antral wall thickening, the latter which could be inflammatory or due to additional infiltrating disease. 2. Increasing fluid distended stomach proximal to the antral fold thickening. 3. Increased size of bulky gastrohepatic ligament adenopathy which completely encases and narrows the main hepatic artery and splenic artery, and encases and severely narrows the proximal hepatic portal vein. 4. Increased size of a pre-esophageal lymph node, and increased size of portacaval and aortocaval lymph nodes. 5. 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 6. Two new hypervascular liver lesions concerning for metastases. 7. New large-volume abdominal and pelvic ascites with increased omental varices. 8. Increased intrahepatic and extrahepatic biliary dilatation, etiology may be abutting gastrohepatic ligament and periportal adenopathy in the area. 9. Increased mild body wall anasarca and mesenteric congestive changes. 10. Increased small left pleural effusion. 11. Constipation. 12. The left adrenal gland is not well seen due to adjacent bulky left para-aortic chain adenopathy and could also be tumor-involved. Electronically Signed   By: Almira Bar M.D.   On: 07/01/2023 21:50   DG Abd 2 Views  Result Date: 06/30/2023 CLINICAL DATA:  Abdominal distension, rule out obstruction. EXAM: ABDOMEN - 2 VIEW COMPARISON:  CT 05/18/2023 FINDINGS: No free intra-abdominal air. No small bowel distention or evidence of obstruction. There is a large volume of stool throughout the colon. Small volume of stool in the rectum. Multiple pelvic phleboliths. Gallstones on prior CT are not well seen. Small left pleural effusion was present on prior abdominal CT. IMPRESSION: Large volume of stool throughout the colon suggesting constipation. No evidence of bowel obstruction.  Electronically Signed   By: Narda Rutherford M.D.   On: 06/30/2023 15:04     Assessment and plan- Patient is a 62 y.o. female   stage IV adenocarcinoma of the esophagus - s/p cycle 2 of FOLFIRI chemotherapy on 06/27/23. Follow up with Dr. Smith Robert.  Anemia- hmg stable at 9.8. Hold transfusion for tomorrow.  Presyncope- d/t Limited intake secondary to d/t ascites, constipation, treatment. IV fluids today for presyncopal episode. Suspect orthostasis. Resolved with laying flat and fluids. Hold antihypertensives.  Ascites- plan for palliative paracentesis this afternoon.  Constipation- Continue miralax with increased fluid intake to maintain soft stool. Add senna if needed.  UTI- on cephalexin TID per ER. Continue.  Neoplasm related pain- follow up with palliative care. Fentanyl refilled.   Disposition:  Cancel blood tomorrow.  IV fluids instead (patient may call and cancel if tolerating orals) F/u with [facility] as scheduled- la    Visit Diagnosis 1. Postural dizziness with presyncope   2. Convalescence following chemotherapy   3. Therapeutic opioid induced constipation   4. Neoplasm related pain    Consuello Masse,  DNP, AGNP-C, AOCNP [facility] [phone] (clinic) 07/04/2023

## 2023-07-04 NOTE — Procedures (Signed)
Ultrasound-guided diagnostic and therapeutic paracentesis performed yielding 2.3 liters of cloudy yellow colored fluid.  Fluid was sent to lab for analysis. No immediate complications. EBL is none.

## 2023-07-05 ENCOUNTER — Other Ambulatory Visit: Payer: Self-pay

## 2023-07-05 ENCOUNTER — Emergency Department: Payer: BC Managed Care – PPO

## 2023-07-05 ENCOUNTER — Inpatient Hospital Stay
Admission: EM | Admit: 2023-07-05 | Discharge: 2023-07-12 | DRG: 689 | Disposition: A | Discharge status: Hospice | Payer: BC Managed Care – PPO | Attending: Internal Medicine | Admitting: Internal Medicine

## 2023-07-05 ENCOUNTER — Inpatient Hospital Stay (HOSPITAL_BASED_OUTPATIENT_CLINIC_OR_DEPARTMENT_OTHER): Payer: BC Managed Care – PPO | Admitting: Hospice and Palliative Medicine

## 2023-07-05 ENCOUNTER — Inpatient Hospital Stay: Payer: BC Managed Care – PPO

## 2023-07-05 ENCOUNTER — Telehealth: Payer: Self-pay | Admitting: *Deleted

## 2023-07-05 VITALS — BP 131/77 | HR 62 | Temp 98.6°F | Resp 20

## 2023-07-05 DIAGNOSIS — R188 Other ascites: Secondary | ICD-10-CM | POA: Diagnosis present

## 2023-07-05 DIAGNOSIS — Z515 Encounter for palliative care: Secondary | ICD-10-CM

## 2023-07-05 DIAGNOSIS — Z83438 Family history of other disorder of lipoprotein metabolism and other lipidemia: Secondary | ICD-10-CM

## 2023-07-05 DIAGNOSIS — T451X5A Adverse effect of antineoplastic and immunosuppressive drugs, initial encounter: Secondary | ICD-10-CM | POA: Diagnosis present

## 2023-07-05 DIAGNOSIS — D61810 Antineoplastic chemotherapy induced pancytopenia: Secondary | ICD-10-CM | POA: Diagnosis present

## 2023-07-05 DIAGNOSIS — Z1152 Encounter for screening for COVID-19: Secondary | ICD-10-CM

## 2023-07-05 DIAGNOSIS — D630 Anemia in neoplastic disease: Secondary | ICD-10-CM | POA: Diagnosis present

## 2023-07-05 DIAGNOSIS — C160 Malignant neoplasm of cardia: Secondary | ICD-10-CM | POA: Diagnosis present

## 2023-07-05 DIAGNOSIS — R932 Abnormal findings on diagnostic imaging of liver and biliary tract: Secondary | ICD-10-CM

## 2023-07-05 DIAGNOSIS — Z823 Family history of stroke: Secondary | ICD-10-CM

## 2023-07-05 DIAGNOSIS — R55 Syncope and collapse: Secondary | ICD-10-CM

## 2023-07-05 DIAGNOSIS — K7589 Other specified inflammatory liver diseases: Secondary | ICD-10-CM | POA: Diagnosis present

## 2023-07-05 DIAGNOSIS — E039 Hypothyroidism, unspecified: Secondary | ICD-10-CM | POA: Diagnosis present

## 2023-07-05 DIAGNOSIS — Z66 Do not resuscitate: Secondary | ICD-10-CM | POA: Diagnosis present

## 2023-07-05 DIAGNOSIS — I4891 Unspecified atrial fibrillation: Secondary | ICD-10-CM | POA: Diagnosis present

## 2023-07-05 DIAGNOSIS — D61811 Other drug-induced pancytopenia: Secondary | ICD-10-CM | POA: Diagnosis not present

## 2023-07-05 DIAGNOSIS — Z882 Allergy status to sulfonamides status: Secondary | ICD-10-CM | POA: Diagnosis not present

## 2023-07-05 DIAGNOSIS — Z5111 Encounter for antineoplastic chemotherapy: Secondary | ICD-10-CM | POA: Diagnosis not present

## 2023-07-05 DIAGNOSIS — Z79891 Long term (current) use of opiate analgesic: Secondary | ICD-10-CM

## 2023-07-05 DIAGNOSIS — I10 Essential (primary) hypertension: Secondary | ICD-10-CM | POA: Diagnosis present

## 2023-07-05 DIAGNOSIS — I482 Chronic atrial fibrillation, unspecified: Secondary | ICD-10-CM | POA: Diagnosis not present

## 2023-07-05 DIAGNOSIS — D709 Neutropenia, unspecified: Secondary | ICD-10-CM | POA: Diagnosis present

## 2023-07-05 DIAGNOSIS — N390 Urinary tract infection, site not specified: Principal | ICD-10-CM | POA: Diagnosis present

## 2023-07-05 DIAGNOSIS — E785 Hyperlipidemia, unspecified: Secondary | ICD-10-CM | POA: Diagnosis present

## 2023-07-05 DIAGNOSIS — Z8249 Family history of ischemic heart disease and other diseases of the circulatory system: Secondary | ICD-10-CM | POA: Diagnosis not present

## 2023-07-05 DIAGNOSIS — K831 Obstruction of bile duct: Secondary | ICD-10-CM | POA: Diagnosis present

## 2023-07-05 DIAGNOSIS — C7A8 Other malignant neuroendocrine tumors: Secondary | ICD-10-CM

## 2023-07-05 DIAGNOSIS — I480 Paroxysmal atrial fibrillation: Secondary | ICD-10-CM | POA: Diagnosis present

## 2023-07-05 DIAGNOSIS — R7989 Other specified abnormal findings of blood chemistry: Secondary | ICD-10-CM | POA: Diagnosis not present

## 2023-07-05 DIAGNOSIS — Z79899 Other long term (current) drug therapy: Secondary | ICD-10-CM | POA: Diagnosis not present

## 2023-07-05 DIAGNOSIS — E871 Hypo-osmolality and hyponatremia: Secondary | ICD-10-CM | POA: Diagnosis present

## 2023-07-05 DIAGNOSIS — C7B8 Other secondary neuroendocrine tumors: Secondary | ICD-10-CM | POA: Diagnosis present

## 2023-07-05 DIAGNOSIS — I959 Hypotension, unspecified: Secondary | ICD-10-CM | POA: Diagnosis present

## 2023-07-05 DIAGNOSIS — I9589 Other hypotension: Secondary | ICD-10-CM | POA: Diagnosis not present

## 2023-07-05 DIAGNOSIS — Z808 Family history of malignant neoplasm of other organs or systems: Secondary | ICD-10-CM

## 2023-07-05 DIAGNOSIS — B9620 Unspecified Escherichia coli [E. coli] as the cause of diseases classified elsewhere: Secondary | ICD-10-CM | POA: Diagnosis present

## 2023-07-05 DIAGNOSIS — G893 Neoplasm related pain (acute) (chronic): Secondary | ICD-10-CM | POA: Diagnosis present

## 2023-07-05 DIAGNOSIS — R627 Adult failure to thrive: Secondary | ICD-10-CM | POA: Diagnosis present

## 2023-07-05 DIAGNOSIS — R180 Malignant ascites: Secondary | ICD-10-CM | POA: Diagnosis not present

## 2023-07-05 DIAGNOSIS — R531 Weakness: Secondary | ICD-10-CM

## 2023-07-05 DIAGNOSIS — Z6829 Body mass index (BMI) 29.0-29.9, adult: Secondary | ICD-10-CM

## 2023-07-05 DIAGNOSIS — Z539 Procedure and treatment not carried out, unspecified reason: Secondary | ICD-10-CM | POA: Diagnosis present

## 2023-07-05 LAB — CBC WITH DIFFERENTIAL/PLATELET
Abs Immature Granulocytes: 0.02 10*3/uL (ref 0.00–0.07)
Basophils Absolute: 0 10*3/uL (ref 0.0–0.1)
Basophils Relative: 0 %
Eosinophils Absolute: 0 10*3/uL (ref 0.0–0.5)
Eosinophils Relative: 1 %
HCT: 30.7 % — ABNORMAL LOW (ref 36.0–46.0)
Hemoglobin: 9.8 g/dL — ABNORMAL LOW (ref 12.0–15.0)
Immature Granulocytes: 1 %
Lymphocytes Relative: 66 %
Lymphs Abs: 1 10*3/uL (ref 0.7–4.0)
MCH: 29.8 pg (ref 26.0–34.0)
MCHC: 31.9 g/dL (ref 30.0–36.0)
MCV: 93.3 fL (ref 80.0–100.0)
Monocytes Absolute: 0 10*3/uL — ABNORMAL LOW (ref 0.1–1.0)
Monocytes Relative: 1 %
Neutro Abs: 0.5 10*3/uL — ABNORMAL LOW (ref 1.7–7.7)
Neutrophils Relative %: 31 %
Platelets: 50 10*3/uL — ABNORMAL LOW (ref 150–400)
RBC: 3.29 MIL/uL — ABNORMAL LOW (ref 3.87–5.11)
RDW: 16.7 % — ABNORMAL HIGH (ref 11.5–15.5)
Smear Review: NORMAL
WBC: 1.5 10*3/uL — ABNORMAL LOW (ref 4.0–10.5)
nRBC: 0 % (ref 0.0–0.2)

## 2023-07-05 LAB — COMPREHENSIVE METABOLIC PANEL
ALT: 285 U/L — ABNORMAL HIGH (ref 0–44)
AST: 218 U/L — ABNORMAL HIGH (ref 15–41)
Albumin: 2.8 g/dL — ABNORMAL LOW (ref 3.5–5.0)
Alkaline Phosphatase: 1121 U/L — ABNORMAL HIGH (ref 38–126)
Anion gap: 9 (ref 5–15)
BUN: 21 mg/dL (ref 8–23)
CO2: 24 mmol/L (ref 22–32)
Calcium: 8 mg/dL — ABNORMAL LOW (ref 8.9–10.3)
Chloride: 100 mmol/L (ref 98–111)
Creatinine, Ser: 0.67 mg/dL (ref 0.44–1.00)
GFR, Estimated: >60 mL/min (ref >60)
Glucose, Bld: 115 mg/dL — ABNORMAL HIGH (ref 70–99)
Potassium: 3.9 mmol/L (ref 3.5–5.1)
Sodium: 133 mmol/L — ABNORMAL LOW (ref 135–145)
Total Bilirubin: 4.9 mg/dL — ABNORMAL HIGH (ref 0.3–1.2)
Total Protein: 6.2 g/dL — ABNORMAL LOW (ref 6.5–8.1)

## 2023-07-05 LAB — TROPONIN I (HIGH SENSITIVITY): Troponin I (High Sensitivity): 10 ng/L (ref <18)

## 2023-07-05 MED ORDER — HEPARIN SOD (PORK) LOCK FLUSH 100 UNIT/ML IV SOLN
500.0000 [IU] | Freq: Once | INTRAVENOUS | Status: DC
Start: 2023-07-05 — End: 2023-07-05
  Filled 2023-07-05: qty 5

## 2023-07-05 MED ORDER — SODIUM CHLORIDE 0.9 % IV SOLN
INTRAVENOUS | Status: DC
Start: 2023-07-05 — End: 2023-07-05
  Filled 2023-07-05 (×2): qty 250

## 2023-07-05 MED ORDER — SODIUM CHLORIDE 0.9% FLUSH
10.0000 mL | Freq: Once | INTRAVENOUS | Status: AC
Start: 2023-07-05 — End: 2023-07-05
  Administered 2023-07-05: 10 mL via INTRAVENOUS
  Filled 2023-07-05: qty 10

## 2023-07-05 MED ORDER — SODIUM CHLORIDE 0.9 % IV SOLN
1.0000 g | Freq: Once | INTRAVENOUS | Status: AC
  Administered 2023-07-05: 1 g via INTRAVENOUS
  Filled 2023-07-05: qty 10

## 2023-07-05 MED ORDER — SODIUM CHLORIDE 0.9 % IV BOLUS
1000.0000 mL | Freq: Once | INTRAVENOUS | Status: AC
  Administered 2023-07-05: 1000 mL via INTRAVENOUS

## 2023-07-05 NOTE — ED Notes (Signed)
Pt had labs drawn yesterday at [facility], pt requests to be roomed before drawing from port., port accessed by Cancer center.

## 2023-07-05 NOTE — ED Provider Notes (Signed)
[facility] Provider Note    Event Date/Time   First MD Initiated Contact with Patient 07/05/23 1937     (approximate)  History   Chief Complaint: Weakness  HPI  DARY KRETSCHMER is a 62 y.o. 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  According to the patient and the husband for the past 2 days or so the patient has been severely weak they went to the oncologist yesterday for chemotherapy also had lab work and a urine performed.  Was called back today saying there was a urinary tract infection has only taken a pill of antibiotics.  Patient states tonight she was too weak to get up so they came to the cancer center and ultimately transferred to the emergency department for further evaluation and consideration for admission given the patient's significant weakness and urinary tract infection.  Patient states recent constipation however after a disimpaction in the emergency department 5 days ago has had loose stool.  Denies any abdominal pain more so than baseline denies any chest pain denies any shortness of breath or cough.  No known fever.  Physical Exam   Triage Vital Signs: ED Triage Vitals  Encounter Vitals Group     BP 07/05/23 1525 117/80     Systolic BP Percentile --      Diastolic BP Percentile --      Pulse Rate 07/05/23 1525 (!) 118     Resp 07/05/23 1525 20     Temp 07/05/23 1525 97.8 F (36.6 C)     Temp Source 07/05/23 1525 Oral     SpO2 07/05/23 1525 95 %     Weight 07/05/23 1526 149 lb (67.6 kg)     Height 07/05/23 1526 5' (1.524 m)     Head Circumference --      Peak Flow --      Pain Score 07/05/23 1526 0     Pain Loc --      Pain Education --      Exclude from Growth Chart --     Most recent vital signs: Vitals:   07/05/23 1525  BP: 117/80  Pulse: (!) 118  Resp: 20  Temp: 97.8 F (36.6 C)  SpO2: 95%    General: Awake, no distress.   Patient has a port that is currently accessed. CV:  Good peripheral perfusion.  Regular rate and rhythm  Resp:  Normal effort.  Equal breath sounds bilaterally.  Abd:  No distention.  Soft, nontender.  No rebound or guarding.  ED Results / Procedures / Treatments   EKG  EKG viewed and interpreted by myself shows sinus tachycardia 107 bpm with a narrow QRS, normal axis, normal intervals, nonspecific ST changes.  No ST elevation.  RADIOLOGY  I have reviewed and interpreted chest x-ray images.  No obvious consolidation on my evaluation. Radiology has read the x-ray as small volume pleural effusion.   MEDICATIONS ORDERED IN ED: Medications  sodium chloride 0.9 % bolus 1,000 mL (has no administration in time range)  cefTRIAXone (ROCEPHIN) 1 g in sodium chloride 0.9 % 100 mL IVPB (has no administration in time range)     IMPRESSION / MDM / ASSESSMENT AND PLAN / ED COURSE  I reviewed the triage vital signs and the nursing notes.  Patient's presentation is most consistent with acute presentation with potential threat to life or bodily function.  Patient presents to the emergency department for  generalized weakness.  Patient was seen by oncology yesterday and today patient has a urinary tract infection.  I reviewed the patient's lab work from yesterday no other significant findings mild leukopenia but the patient is currently on chemotherapy.  Patient is mildly tachycardic around 118 bpm.  We will recheck labs in the emergency department including a urinalysis.  Will obtain a COVID swab to rule out COVID or influenza.  Will obtain a chest x-ray and EKG.  Will obtain a cardiac enzyme given the patient's weakness.  Given the urinary tract infection with E. coli positive culture we will start the patient on IV Rocephin and send blood cultures.  I anticipate admission to the hospital for further treatment once her emergency department workup is been completed.  Patient's labs have resulted showing  a white blood cell count of 1.5 with a neutrophil count of 0.5 consistent with neutropenia.  Patient's chemistry shows significant LFT elevation however this is not significantly changed from previous labs.  Patient's troponin is negative.  Chest x-ray shows no acute finding.  Given the patient's urinalysis and urine culture positive for E. coli we will cover with IV antibiotics.  We have sent blood cultures will admit to the hospital service for further workup and treatment.  FINAL CLINICAL IMPRESSION(S) / ED DIAGNOSES   Weakness Urinary tract infection Neutropenia   Note:  This document was prepared using Dragon voice recognition software and may include unintentional dictation errors.   Minna Antis, MD 07/05/23 2244

## 2023-07-05 NOTE — Progress Notes (Signed)
Palliative Medicine [facility] [phone] [phone]   Name: Ariana Wilson Date: 07/05/2023 MRN: [id number]  DOB: 11-01-1960  Patient Care Team: Danella Penton, MD as PCP - General (Internal Medicine) Benita Gutter, RN as Oncology Nurse Navigator Creig Hines, MD as Consulting Physician (Oncology)    REASON FOR CONSULTATION: Ariana Wilson is a 62 y.o. 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  She was diagnosed with cancer in June 2023.  Patient has had disease progression and poor tolerance of previous chemotherapy.  Palliative care is consulted to address goals.  SOCIAL HISTORY:     reports that she has never smoked. She has never used smokeless tobacco. She reports that she does not drink alcohol and does not use drugs.  Patient is married and lives at home with her husband.  She is retired Runner, broadcasting/film/video.   ADVANCE DIRECTIVES:  On file  CODE STATUS: DNR  PAST MEDICAL HISTORY: Past Medical History:  Diagnosis Date   Anemia    Arthritis    Atrial fibrillation (HCC)    B12 deficiency    Cancer (HCC)    Dysrhythmia/history A. Fib    Hyperlipidemia    Hypothyroidism    Menopause    Osteoarthritis    Palpitations    Hx of, 25 years ago   Vertigo     PAST SURGICAL HISTORY:  Past Surgical History:  Procedure Laterality Date   BILATERAL SALPINGOOPHORECTOMY  2008   DILATION AND CURETTAGE OF UTERUS  08/24/2006   ESOPHAGOGASTRODUODENOSCOPY (EGD) WITH PROPOFOL N/A 03/01/2022   Procedure: ESOPHAGOGASTRODUODENOSCOPY (EGD) WITH PROPOFOL;  Surgeon: Regis Bill, MD;  Location: [facility] ENDOSCOPY;  Service: Endoscopy;  Laterality: N/A;   ESOPHAGOGASTRODUODENOSCOPY (EGD) WITH PROPOFOL N/A 05/04/2022   Procedure: ESOPHAGOGASTRODUODENOSCOPY (EGD) WITH PROPOFOL;  Surgeon: [name], MD;  Location: [facility] ENDOSCOPY;  Service: Gastroenterology;   Laterality: N/A;   ESOPHAGOGASTRODUODENOSCOPY (EGD) WITH PROPOFOL N/A 06/01/2023   Procedure: ESOPHAGOGASTRODUODENOSCOPY (EGD) WITH PROPOFOL;  Surgeon: [name], MD;  Location: [facility] ENDOSCOPY;  Service: Gastroenterology;  Laterality: N/A;   HEMOSTASIS CONTROL  06/01/2023   Procedure: HEMOSTASIS CONTROL;  Surgeon: [name], MD;  Location: [facility] ENDOSCOPY;  Service: Gastroenterology;;   incision tendon sheath for trigger finger Right    ring finger   LAPAROSCOPIC SUPRACERVICAL HYSTERECTOMY  2008   Dr. Arvil Chaco; with BSO   NOSE SURGERY  03/2008   PORTA CATH INSERTION N/A 03/21/2022   Procedure: PORTA CATH INSERTION;  Surgeon: Annice Needy, MD;  Location: [facility] INVASIVE CV LAB;  Service: Cardiovascular;  Laterality: N/A;    HEMATOLOGY/ONCOLOGY HISTORY:  Oncology History  Primary malignant neuroendocrine tumor of esophagus (HCC)  03/20/2022 Initial Diagnosis   Primary malignant neuroendocrine neoplasm of esophagus (HCC)   03/20/2022 Cancer Staging   Staging form: Esophagus - Other Histologies, AJCC 8th Edition - Clinical stage from 03/20/2022: cTX, cN1, cM1, G3 - Signed by Creig Hines, MD on 03/20/2022 Histopathologic type: Large cell neuroendocrine carcinoma Histologic grading system: 3 grade system   03/30/2022 - 04/29/2022 Chemotherapy   Patient is on Treatment Plan : NEUROENDOCRINE GE junctionCisplatin/Tecentriq D1 + Etoposide D1-3 q21d     05/17/2022 - 09/08/2022 Chemotherapy   Patient is on Treatment Plan : GASTROESOPHAGEAL FOLFOX q14d      06/06/2023 -  Chemotherapy   Patient is on Treatment Plan : GE junction adenocarcinoma FOLFIRI q14d     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  MEDICATIONS:  Current  Outpatient Medications  Medication Sig Dispense Refill   Calcium Carbonate-Vitamin D 600-200 MG-UNIT TABS Take 1 tablet by mouth daily.   (Patient not taking: Reported on 06/13/2023)     cephALEXin (KEFLEX) 500 MG capsule Take 1 capsule (500 mg total) by mouth 3 (three) times daily for 5 days. 15 capsule 0   cetirizine (ZYRTEC) 10 MG tablet Take 10 mg by mouth daily. (Patient not taking: Reported on 06/13/2023)     Cholecalciferol 25 MCG (1000 UT) tablet Take 1,000 Units by mouth daily. (Patient not taking: Reported on 06/13/2023)     Cobalamin Combinations (B-12 PLUS FOLIC ACID PO)      cyanocobalamin (VITAMIN B12) 1000 MCG tablet Take 1,000 mcg by mouth daily.     dexamethasone (DECADRON) 4 MG tablet Take 2 tablets (8 mg total) by mouth daily. Start the day after chemotherapy for 2 days. Take with food. 8 tablet 5   dicyclomine (BENTYL) 20 MG tablet Take 20 mg by mouth 2 (two) times daily.     diphenoxylate-atropine (LOMOTIL) 2.5-0.025 MG tablet Take 2 tablets by mouth every 6 (six) hours as needed for diarrhea or loose stools. 120 tablet 0   etodolac (LODINE) 400 MG tablet Take 400 mg by mouth 2 (two) times daily.     famotidine (PEPCID) 40 MG tablet Take 1 tablet by mouth at bedtime. (Patient not taking: Reported on 06/13/2023)     fentaNYL (DURAGESIC) 25 MCG/HR Place 1 patch onto the skin every 3 (three) days. 10 patch 0   flecainide (TAMBOCOR) 50 MG tablet Take 1 tablet by mouth 2 (two) times daily.     lactulose (CHRONULAC) 10 GM/15ML solution Take 15-30 mLs (10-20 g total) by mouth 3 (three) times daily as needed for mild constipation. 240 mL 3   lidocaine-prilocaine (EMLA) cream Apply to affected area once (Patient not taking: Reported on 06/13/2023) 30 g 3   loperamide (IMODIUM) 2 MG capsule Take 2 tabs by mouth with first loose stool, then 1 tab with each additional loose stool as needed. Do not exceed 8 tabs in a 24-hour period (Patient not taking: Reported on 05/29/2023) 60 capsule 2    Melatonin 10 MG TABS Take by mouth. (Patient not taking: Reported on 06/20/2023)     metoprolol tartrate (LOPRESSOR) 25 MG tablet Take 25 mg by mouth 2 (two) times daily as needed.     midodrine (PROAMATINE) 10 MG tablet Take by mouth.     mometasone (NASONEX) 50 MCG/ACT nasal spray Place 2 sprays into the nose daily.     OLANZapine (ZYPREXA) 10 MG tablet Take 1 tablet (10 mg total) by mouth at bedtime. 30 tablet 1   ondansetron (ZOFRAN) 8 MG tablet Take 1 tablet (8 mg total) by mouth every 8 (eight) hours as needed for nausea, vomiting or refractory nausea / vomiting. Start on the third day after chemotherapy. 30 tablet 1   oxyCODONE (OXY IR/ROXICODONE) 5 MG immediate release tablet Take 1 tablet (5 mg total) by mouth every 4 (four) hours as needed for severe pain. 60 tablet 0   pantoprazole (PROTONIX) 40 MG tablet Take 40 mg by mouth daily. (Patient not  taking: Reported on 06/13/2023)     polyethylene glycol (MIRALAX / GLYCOLAX) 17 g packet Take by mouth. (Patient not taking: Reported on 06/13/2023)     promethazine (PHENERGAN) 25 MG tablet Take 1 tablet (25 mg total) by mouth every 8 (eight) hours as needed for refractory nausea / vomiting. May cause sleepiness. (Patient not taking: Reported on 06/20/2023) 30 tablet 0   psyllium (METAMUCIL) 58.6 % powder Take 1 packet by mouth 3 (three) times daily. (Patient not taking: Reported on 06/13/2023)     Saccharomyces boulardii (PROBIOTIC) 250 MG CAPS Take 1 capsule by mouth daily. (Patient not taking: Reported on 06/13/2023)     simvastatin (ZOCOR) 20 MG tablet Take 1 tablet by mouth at bedtime. (Patient not taking: Reported on 06/13/2023)     sucralfate (CARAFATE) 1 g tablet Take 1 g by mouth 4 (four) times daily.     traZODone (DESYREL) 50 MG tablet Take 50 mg by mouth at bedtime as needed.     Zinc Citrate-Phytase (ZYTAZE) 25-500 MG CAPS Take 1 Dose by mouth daily. (Patient not taking: Reported on 06/13/2023)     No current facility-administered medications  for this visit.   Facility-Administered Medications Ordered in Other Visits  Medication Dose Route Frequency Provider Last Rate Last Admin   0.9 %  sodium chloride infusion   Intravenous Continuous Lyndel Sarate, Daryl Eastern, NP 999 mL/hr at 07/05/23 1318 New Bag at 07/05/23 1318   heparin lock flush 100 UNIT/ML injection            heparin lock flush 100 unit/mL  500 Units Intravenous Once Rontrell Moquin, Daryl Eastern, NP        VITAL SIGNS: There were no vitals taken for this visit. There were no vitals filed for this visit.  Estimated body mass index is 27.93 kg/m as calculated from the following:   Height as of 07/01/23: 5' (1.524 m).   Weight as of 07/04/23: 143 lb (64.9 kg).  LABS: CBC:    Component Value Date/Time   WBC 3.5 (L) 07/04/2023 1102   WBC 7.7 07/01/2023 1549   HGB 9.6 (L) 07/04/2023 1102   HCT 29.9 (L) 07/04/2023 1102   PLT 79 (L) 07/04/2023 1102   MCV 94.9 07/04/2023 1102   NEUTROABS 1.6 (L) 07/04/2023 1102   LYMPHSABS 1.8 07/04/2023 1102   MONOABS 0.0 (L) 07/04/2023 1102   EOSABS 0.0 07/04/2023 1102   BASOSABS 0.0 07/04/2023 1102   Comprehensive Metabolic Panel:    Component Value Date/Time   NA 130 (L) 07/04/2023 1102   K 4.5 07/04/2023 1102   CL 99 07/04/2023 1102   CO2 25 07/04/2023 1102   BUN 25 (H) 07/04/2023 1102   CREATININE 0.86 07/04/2023 1102   GLUCOSE 105 (H) 07/04/2023 1102   CALCIUM 8.2 (L) 07/04/2023 1102   AST 292 (H) 07/01/2023 1549   AST 203 (HH) 06/27/2023 1013   ALT 248 (H) 07/01/2023 1549   ALT 150 (H) 06/27/2023 1013   ALKPHOS 976 (H) 07/01/2023 1549   BILITOT 5.9 (H) 07/01/2023 1549   BILITOT 1.1 06/27/2023 1013   PROT 6.4 (L) 07/01/2023 1549   PROT 6.6 03/06/2015 0910   ALBUMIN 2.8 (L) 07/01/2023 1549   ALBUMIN 4.6 03/06/2015 0910    RADIOGRAPHIC STUDIES: US Paracentesis  Result Date: 07/05/2023 INDICATION: 62 year old female. History of adenocarcinoma of the GE junction. Found to have ascites. Request is for therapeutic and  diagnostic paracentesis EXAM: ULTRASOUND GUIDED THERAPEUTIC AND DIAGNOSTIC PARACENTESIS MEDICATIONS: Lidocaine 1% 10 mL COMPLICATIONS: None  immediate. PROCEDURE: Informed written consent was obtained from the patient after a discussion of the risks, benefits and alternatives to treatment. A timeout was performed prior to the initiation of the procedure. Initial ultrasound scanning demonstrates a small amount of ascites within the right lower abdominal quadrant. The right lower abdomen was prepped and draped in the usual sterile fashion. 1% lidocaine was used for local anesthesia. Following this, a 19 gauge, 10-cm, Yueh catheter was introduced. An ultrasound image was saved for documentation purposes. The paracentesis was performed. The catheter was removed and a dressing was applied. The patient tolerated the procedure well without immediate post procedural complication. FINDINGS: A total of approximately 2.3 L of cloudy yellow fluid was removed. Samples were sent to the laboratory as requested by the clinical team. IMPRESSION: Successful ultrasound-guided therapeutic and diagnostic paracentesis yielding 2.3 liters of peritoneal fluid. Performed by Anders Grant NP Electronically Signed   By: Olive Bass M.D.   On: 07/05/2023 08:42   CT ABDOMEN PELVIS W CONTRAST  Result Date: 07/01/2023 CLINICAL DATA:  Cancer patient being treated with chemotherapy for GE junction neoplasm, presents with loss of appetite and increasing abdominal distension, abdominal pain and worsening constipation. 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 EXAM: CT ABDOMEN AND PELVIS WITH CONTRAST TECHNIQUE: Multidetector CT imaging of the abdomen and pelvis was performed using the standard protocol following bolus administration of intravenous contrast.  RADIATION DOSE REDUCTION: This exam was performed according to the departmental dose-optimization program which includes automated exposure control, adjustment of the mA and/or kV according to patient size and/or use of iterative reconstruction technique. CONTRAST:  OMNIPAQUE IOHEXOL 300 MG/ML  SOLN COMPARISON:  There are multiple prior CTs. The 2 most recent do not have available reports as they were performed at a [facility] facility. This includes a CT of the abdomen with contrast 05/18/2023 and CT abdomen and pelvis with IV contrast 01/12/2023. The most recent exam with an attached report is CT chest, abdomen and pelvis CT with contrast 08/02/2023. the patient's last PET-CT was 09/15/2022 and also does not have an attached report. FINDINGS: Lower chest: There is subsegmental atelectasis in both bases. There is a small left layering pleural effusion which has increased in volume since 05/18/2023. No right pleural effusion. A port catheter again terminates in the upper right atrium. The cardiac size is normal. Compared with 05/18/2023 there is increasing fold thickening in the distal esophagus and proximal stomach, increased luminal effacement. There is a periesophageal lymph node on 2:17 which is larger than previously now 1.6 x 1 cm, previously 1.1 x 0.4 cm (2:17). Hepatobiliary: There is a new 1.6 cm hypervascular lesion posteriorly in segment 7 on 2:23 concerning for metastasis. Another measuring 8 mm is noted in segment 4A on 2:18 and is also suspicious. The liver is mildly steatotic. No other mass enhancement is seen. There is cholelithiasis with the gallbladder contracted. There is increased intrahepatic and extrahepatic biliary prominence with the common bile duct now 9 mm. Etiology may be abutting gastrohepatic ligament and periportal adenopathy in the area contributing to this. Pancreas: No primary mass or ductal dilatation, but there is gastrohepatic ligament adenopathy just above the pancreatic neck  which is inseparable from the pancreas in may potentially infiltrate the pancreas. Spleen: Somewhat heterogeneous enhancement in the medial spleen initially but uniform enhancement on the delayed images, probably due to perfusional  differences or bolus timing. The spleen is not enlarged. Adrenals/Urinary Tract: There is a new solid 1.8 x 1.3 cm right adrenal genu mass almost certainly metastatic. The left adrenal gland is not well seen due to adjacent bulky left para-aortic chain adenopathy and could also be tumor-involved. The kidneys concentrate and excrete contrast symmetrically with no mass enhancement. There is increased trace bilateral perinephric fluid. No hydronephrosis, urinary stones or bladder thickening. Stomach/Bowel: As above there is increased masslike fold thickening at the GE junction extending into the proximal stomach, but there is also increased moderate to severe irregular gastric antral wall thickening, which could be inflammatory or due to additional infiltrating disease. Furthermore the stomach is increasingly fluid distended proximal to this. No small bowel obstruction is seen. An appendix is not seen in this patient. Large volume of retained stool is increased over priors. No bowel wall thickening. Vascular/Lymphatic: Multifocal bulky adenopathy. There is a conglomerate gastrohepatic ligament mass measuring 8.1 x 5.8 cm on 2:32, previously 7.5 x 4.2 cm. This mass completely encases, and narrows the main hepatic artery and splenic artery and encases and severely narrows the proximal hepatic portal vein. There are additional portacaval lymph nodes, largest of these is 2.2 x 1.6 cm on 2:34, slightly larger than previously. There is worsened retroperitoneal lymphadenopathy. Bulky left periaortic chain nodes are noted with the largest of these measuring 3.8 x 2.7 cm on 2:31, on the last CT was 3 x 1.9 cm, with multiple others in this chain abutting and narrowing the left renal vein. Multiple  enlarged aortocaval lymph nodes are also noted, for example on 2:39 measuring 2.5 x 1.3 cm, previously 2.1 x 1.2 cm. Some of these lymph nodes partially efface the IVC lumen. No pelvic or bulky mesenteric adenopathy is seen. Reproductive: Status post hysterectomy. No adnexal masses. Other: Since 05/18/2023, large-volume abdominal and pelvic ascites has developed in the abdomen and pelvis with scattered fluid in the mesenteric folds. There is no free air, localizing collection or hemorrhage. There is a new mesenteric lymph node in the transverse mesocolon measuring 2.0 x 1.4 cm on 2:45. Scattered omental varices. No hernias. Increased mild body wall anasarca and mesenteric congestive changes. Musculoskeletal: No regional bone metastasis is seen. IMPRESSION: 1. Increasing masslike fold thickening at the GE junction extending into the proximal stomach, with increased moderate to severe irregular gastric antral wall thickening, the latter which could be inflammatory or due to additional infiltrating disease. 2. Increasing fluid distended stomach proximal to the antral fold thickening. 3. Increased size of bulky gastrohepatic ligament adenopathy which completely encases and narrows the main hepatic artery and splenic artery, and encases and severely narrows the proximal hepatic portal vein. 4. Increased size of a pre-esophageal lymph node, and increased size of portacaval and aortocaval lymph nodes. 5. 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 6. Two new hypervascular liver lesions concerning for metastases. 7. New large-volume abdominal and pelvic ascites with increased omental varices. 8. Increased intrahepatic and extrahepatic biliary dilatation, etiology may be abutting gastrohepatic ligament and periportal adenopathy in the area. 9. Increased mild body wall anasarca and mesenteric congestive changes. 10. Increased small left pleural effusion. 11. Constipation. 12. The left adrenal gland  is not well seen due to adjacent bulky left para-aortic chain adenopathy and could also be tumor-involved. Electronically Signed   By: Almira Bar M.D.   On: 07/01/2023 21:50   DG Abd 2 Views  Result Date: 06/30/2023 CLINICAL DATA:  Abdominal distension, rule out  obstruction. EXAM: ABDOMEN - 2 VIEW COMPARISON:  CT 05/18/2023 FINDINGS: No free intra-abdominal air. No small bowel distention or evidence of obstruction. There is a large volume of stool throughout the colon. Small volume of stool in the rectum. Multiple pelvic phleboliths. Gallstones on prior CT are not well seen. Small left pleural effusion was present on prior abdominal CT. IMPRESSION: Large volume of stool throughout the colon suggesting constipation. No evidence of bowel obstruction. Electronically Signed   By: Narda Rutherford M.D.   On: 06/30/2023 15:04    PERFORMANCE STATUS (ECOG) : 2 - Symptomatic, <50% confined to bed  Review of Systems Unless otherwise noted, a complete review of systems is negative.  Physical Exam General: NAD Cardiovascular: regular rate and rhythm Pulmonary: clear ant fields Abdomen: soft, nontender, + bowel sounds GU: no suprapubic tenderness Extremities: no edema, no joint deformities Skin: no rashes Neurological: Weakness but otherwise nonfocal  IMPRESSION: Patient was seen in clinic yesterday felt to be dehydrated with poor oral intake.  Received IV fluids for presyncopal episode.  Underwent paracentesis with 2.3 L fluid removed.  Return to clinic today for repeat IV fluids.  Overall, patient says that she feels poorly.  She feels weak and fatigued.  Denies fever or chills.  Has poor oral intake.  No nausea or vomiting.  Is having diarrhea.  On Keflex due to recent UTI.  Also was on MiraLAX due to recent constipation (seen in ED on 10/20).  Had a long conversation with patient and husband today regarding overall goals.  Patient is on second line dose reduced FOLFIRI but has overall poorly  tolerated chemotherapy.  Likely symptoms are secondary to cancer progression.  Husband states that patient has talked about being "tired".  We discussed quality of life versus quantity of life and if patient desired further treatment versus focusing on best supportive care.  Patient says that she would like to transfer to the emergency department for further evaluation and management.  Report called to ED triage.  Of note, patient says that she would not want to be resuscitated or have her life prolonged artificially on machines.  She would like to be a DNR.  PLAN: -Continue current scope of treatment -DNR -Patient transferred to ED per her request  Case and plan discussed with Dr. Smith Robert   Patient expressed understanding and was in agreement with this plan. She also understands that She can call the clinic at any time with any questions, concerns, or complaints.     Time Total: 30 minutes  Visit consisted of counseling and education dealing with the complex and emotionally intense issues of symptom management and palliative care in the setting of serious and potentially life-threatening illness.Greater than 50%  of this time was spent counseling and coordinating care related to the above assessment and plan.  Signed by: Laurette Schimke, PhD, NP-C

## 2023-07-05 NOTE — Telephone Encounter (Signed)
RN spoke with patient husband who stated patient would need to have fluids today as scheduled.  Husband stated he was concerned that patient is much weaker today and has no energy. Requesting if there was anything else we could do during visit, stated some lab results were low.

## 2023-07-05 NOTE — ED Notes (Signed)
Called lab for blood collection. Port will not draw back.

## 2023-07-05 NOTE — ED Triage Notes (Signed)
Pt presents to ED with c/o of abnormal labs from the cancer center. Pt states labs drawn yesterday at [facility] and also states she had a liter of fluid PTA from cancer center. Pt states taking ABX for a UTI, pt endorses watery type diarrhea.

## 2023-07-05 NOTE — ED Notes (Signed)
Patient is resting comfortably. 

## 2023-07-06 ENCOUNTER — Inpatient Hospital Stay: Payer: BC Managed Care – PPO

## 2023-07-06 DIAGNOSIS — Z515 Encounter for palliative care: Secondary | ICD-10-CM

## 2023-07-06 DIAGNOSIS — R7989 Other specified abnormal findings of blood chemistry: Secondary | ICD-10-CM | POA: Diagnosis not present

## 2023-07-06 DIAGNOSIS — B9620 Unspecified Escherichia coli [E. coli] as the cause of diseases classified elsewhere: Secondary | ICD-10-CM

## 2023-07-06 DIAGNOSIS — R531 Weakness: Secondary | ICD-10-CM

## 2023-07-06 DIAGNOSIS — I480 Paroxysmal atrial fibrillation: Secondary | ICD-10-CM

## 2023-07-06 DIAGNOSIS — R932 Abnormal findings on diagnostic imaging of liver and biliary tract: Secondary | ICD-10-CM

## 2023-07-06 DIAGNOSIS — N390 Urinary tract infection, site not specified: Secondary | ICD-10-CM | POA: Diagnosis not present

## 2023-07-06 DIAGNOSIS — D61811 Other drug-induced pancytopenia: Secondary | ICD-10-CM

## 2023-07-06 DIAGNOSIS — C7A8 Other malignant neuroendocrine tumors: Secondary | ICD-10-CM | POA: Diagnosis not present

## 2023-07-06 DIAGNOSIS — R180 Malignant ascites: Secondary | ICD-10-CM

## 2023-07-06 DIAGNOSIS — R188 Other ascites: Secondary | ICD-10-CM

## 2023-07-06 LAB — COMPREHENSIVE METABOLIC PANEL
ALT: 217 U/L — ABNORMAL HIGH (ref 0–44)
AST: 154 U/L — ABNORMAL HIGH (ref 15–41)
Albumin: 2.1 g/dL — ABNORMAL LOW (ref 3.5–5.0)
Alkaline Phosphatase: 824 U/L — ABNORMAL HIGH (ref 38–126)
Anion gap: 6 (ref 5–15)
BUN: 22 mg/dL (ref 8–23)
CO2: 23 mmol/L (ref 22–32)
Calcium: 7.6 mg/dL — ABNORMAL LOW (ref 8.9–10.3)
Chloride: 103 mmol/L (ref 98–111)
Creatinine, Ser: 0.53 mg/dL (ref 0.44–1.00)
GFR, Estimated: >60 mL/min (ref >60)
Glucose, Bld: 99 mg/dL (ref 70–99)
Potassium: 4 mmol/L (ref 3.5–5.1)
Sodium: 132 mmol/L — ABNORMAL LOW (ref 135–145)
Total Bilirubin: 4 mg/dL — ABNORMAL HIGH (ref 0.3–1.2)
Total Protein: 4.6 g/dL — ABNORMAL LOW (ref 6.5–8.1)

## 2023-07-06 LAB — RESP PANEL BY RT-PCR (RSV, FLU A&B, COVID)  RVPGX2
Influenza A by PCR: NEGATIVE
Influenza B by PCR: NEGATIVE
Resp Syncytial Virus by PCR: NEGATIVE
SARS Coronavirus 2 by RT PCR: NEGATIVE

## 2023-07-06 LAB — PROTIME-INR
INR: 1.2 (ref 0.8–1.2)
Prothrombin Time: 15.3 s — ABNORMAL HIGH (ref 11.4–15.2)

## 2023-07-06 LAB — CBC
HCT: 23.8 % — ABNORMAL LOW (ref 36.0–46.0)
Hemoglobin: 7.8 g/dL — ABNORMAL LOW (ref 12.0–15.0)
MCH: 30.4 pg (ref 26.0–34.0)
MCHC: 32.8 g/dL (ref 30.0–36.0)
MCV: 92.6 fL (ref 80.0–100.0)
Platelets: 37 10*3/uL — ABNORMAL LOW (ref 150–400)
RBC: 2.57 MIL/uL — ABNORMAL LOW (ref 3.87–5.11)
RDW: 16.1 % — ABNORMAL HIGH (ref 11.5–15.5)
WBC: 1.4 10*3/uL — CL (ref 4.0–10.5)
nRBC: 0 % (ref 0.0–0.2)

## 2023-07-06 LAB — HEPATITIS PANEL, ACUTE
HCV Ab: NONREACTIVE
Hep A IgM: NONREACTIVE
Hep B C IgM: NONREACTIVE
Hepatitis B Surface Ag: NONREACTIVE

## 2023-07-06 LAB — GLUCOSE, PLEURAL OR PERITONEAL FLUID: Glucose, Fluid: 113 mg/dL

## 2023-07-06 LAB — HIV ANTIBODY (ROUTINE TESTING W REFLEX): HIV Screen 4th Generation wRfx: NONREACTIVE

## 2023-07-06 LAB — BODY FLUID CELL COUNT WITH DIFFERENTIAL
Eos, Fluid: 0 %
Lymphs, Fluid: 63 %
Monocyte-Macrophage-Serous Fluid: 34 %
Neutrophil Count, Fluid: 3 %
Total Nucleated Cell Count, Fluid: 170 uL

## 2023-07-06 LAB — ACETAMINOPHEN LEVEL: Acetaminophen (Tylenol), Serum: 12 ug/mL (ref 10–30)

## 2023-07-06 LAB — PROTEIN, PLEURAL OR PERITONEAL FLUID: Total protein, fluid: <3 g/dL

## 2023-07-06 MED ORDER — ONDANSETRON HCL 4 MG/2ML IJ SOLN
4.0000 mg | Freq: Four times a day (QID) | INTRAMUSCULAR | Status: DC | PRN
  Administered 2023-07-09 – 2023-07-10 (×2): 4 mg via INTRAVENOUS
  Filled 2023-07-06 (×2): qty 2

## 2023-07-06 MED ORDER — ONDANSETRON HCL 4 MG PO TABS: 4.0000 mg | ORAL_TABLET | Freq: Four times a day (QID) | ORAL | Status: DC | PRN

## 2023-07-06 MED ORDER — DICYCLOMINE HCL 20 MG PO TABS
20.0000 mg | ORAL_TABLET | Freq: Two times a day (BID) | ORAL | Status: DC
  Administered 2023-07-06 – 2023-07-12 (×12): 20 mg via ORAL
  Filled 2023-07-06 (×13): qty 1

## 2023-07-06 MED ORDER — LIDOCAINE HCL (PF) 1 % IJ SOLN
10.0000 mL | Freq: Once | INTRAMUSCULAR | Status: AC
Start: 2023-07-06 — End: 2023-07-06
  Administered 2023-07-06: 10 mL via SUBCUTANEOUS
  Filled 2023-07-06: qty 10

## 2023-07-06 MED ORDER — FENTANYL 25 MCG/HR TD PT72
1.0000 | MEDICATED_PATCH | TRANSDERMAL | Status: DC
  Administered 2023-07-06 – 2023-07-12 (×3): 1 via TRANSDERMAL
  Filled 2023-07-06 (×3): qty 1

## 2023-07-06 MED ORDER — MORPHINE SULFATE (PF) 2 MG/ML IV SOLN: 2.0000 mg | INTRAVENOUS | Status: DC | PRN

## 2023-07-06 MED ORDER — SODIUM CHLORIDE 0.9 % IV SOLN
1.0000 g | INTRAVENOUS | Status: AC
  Administered 2023-07-06 – 2023-07-08 (×3): 1 g via INTRAVENOUS
  Filled 2023-07-06 (×3): qty 10

## 2023-07-06 MED ORDER — ACETAMINOPHEN 650 MG RE SUPP: 650.0000 mg | Freq: Four times a day (QID) | RECTAL | Status: DC | PRN

## 2023-07-06 MED ORDER — HYDROCODONE-ACETAMINOPHEN 5-325 MG PO TABS: 1.0000 | ORAL_TABLET | ORAL | Status: DC | PRN

## 2023-07-06 MED ORDER — DIPHENOXYLATE-ATROPINE 2.5-0.025 MG PO TABS: 2.0000 | ORAL_TABLET | Freq: Four times a day (QID) | ORAL | Status: DC | PRN

## 2023-07-06 MED ORDER — CHLORHEXIDINE GLUCONATE CLOTH 2 % EX PADS
6.0000 | MEDICATED_PAD | Freq: Every day | CUTANEOUS | Status: DC
Start: 2023-07-06 — End: 2023-07-12
  Administered 2023-07-06 – 2023-07-12 (×7): 6 via TOPICAL

## 2023-07-06 MED ORDER — ALBUMIN HUMAN 25 % IV SOLN
25.0000 g | Freq: Once | INTRAVENOUS | Status: AC
  Administered 2023-07-06: 25 g via INTRAVENOUS
  Filled 2023-07-06: qty 100

## 2023-07-06 MED ORDER — FLECAINIDE ACETATE 50 MG PO TABS
50.0000 mg | ORAL_TABLET | Freq: Every day | ORAL | Status: DC
  Administered 2023-07-06 – 2023-07-12 (×7): 50 mg via ORAL
  Filled 2023-07-06 (×7): qty 1

## 2023-07-06 MED ORDER — TRAZODONE HCL 50 MG PO TABS
50.0000 mg | ORAL_TABLET | Freq: Every evening | ORAL | Status: DC | PRN
  Administered 2023-07-06 – 2023-07-10 (×5): 50 mg via ORAL
  Filled 2023-07-06 (×5): qty 1

## 2023-07-06 MED ORDER — ACETAMINOPHEN 325 MG PO TABS
650.0000 mg | ORAL_TABLET | Freq: Four times a day (QID) | ORAL | Status: DC | PRN
  Administered 2023-07-06: 650 mg via ORAL
  Filled 2023-07-06: qty 2

## 2023-07-06 MED ORDER — OLANZAPINE 10 MG PO TABS
10.0000 mg | ORAL_TABLET | Freq: Every day | ORAL | Status: DC
  Administered 2023-07-06 – 2023-07-11 (×7): 10 mg via ORAL
  Filled 2023-07-06 (×7): qty 1

## 2023-07-06 MED ORDER — LACTULOSE 10 GM/15ML PO SOLN: 10.0000 g | Freq: Three times a day (TID) | ORAL | Status: DC | PRN

## 2023-07-06 MED ORDER — METOPROLOL SUCCINATE ER 25 MG PO TB24
12.5000 mg | ORAL_TABLET | Freq: Every evening | ORAL | Status: DC
  Filled 2023-07-06: qty 1

## 2023-07-06 MED ORDER — MIDODRINE HCL 5 MG PO TABS
5.0000 mg | ORAL_TABLET | Freq: Three times a day (TID) | ORAL | Status: DC
  Administered 2023-07-06 – 2023-07-10 (×11): 5 mg via ORAL
  Filled 2023-07-06 (×11): qty 1

## 2023-07-06 MED ORDER — OXYCODONE HCL 5 MG PO TABS: 5.0000 mg | ORAL_TABLET | ORAL | Status: DC | PRN

## 2023-07-06 NOTE — Progress Notes (Signed)
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  Patient has had disease progression and poor tolerance of previous chemotherapy.  Patient presenting with generalized weakness and has a positive urine culture from 10/19. Repeat CT scan shows no significant change from previous CT scan, large volume gastric mass and gastrohepatic ligament adenopathy unchanged from prior, peritoneal metastases and a large volume intraperitoneal free fluid.  Left adrenal gland metastases unchanged, small right pleural effusion.   Assessment and Plan: * Primary malignant neuroendocrine tumor of esophagus (HCC) CT scan showing progression of tumor.  Patient is a DNR.  Case discussed with Dr. Smith Robert and also messaged palliative care to see.  E. coli UTI Continue Rocephin  Liver lesions on CT concerning for metastases, 07/01/23 Hypervascular liver lesion seen on previous CT.  Ascites s/p paracentesis 07/06/23 Will repeat paracentesis today and give IV albumin.  Abnormal LFTs Likely from metastatic disease  Chemotherapy-induced pancytopenia [facility]) Patient with pancytopenia.  Continue to monitor.  May end up needing a blood transfusion.  Cancer associated pain Continue fentanyl patch and oxycodone  Generalized weakness PT evaluation  ATRIAL FIBRILLATION paroxysmal in nature on flecainide.  Will change metoprolol over to Toprol-XL at smaller dose because blood pressure on the lower side        Subjective: Patient coming in with weakness.,  She has stage IV cancer.  Almost passed out.  Physical Exam: Vitals:   07/06/23 0030 07/06/23 0121 07/06/23 0723 07/06/23 1017  BP: 113/68 119/65 97/64 114/70  Pulse: (!) 102  (!) 110 (!) 110  Resp:  19 18   Temp:  97.9 F (36.6 C) 98.1  F (36.7 C) 98.6 F (37 C)  TempSrc:    Oral  SpO2: 98% 99% 96% 99%  Weight:      Height:       Physical Exam HENT:     Head: Normocephalic.     Mouth/Throat:     Pharynx: No oropharyngeal exudate.  Eyes:     General: Lids are normal.     Conjunctiva/sclera: Conjunctivae normal.  Cardiovascular:     Rate and Rhythm: Regular rhythm. Tachycardia present.     Heart sounds: Normal heart sounds, S1 normal and S2 normal.  Pulmonary:     Breath sounds: No decreased breath sounds, wheezing, rhonchi or rales.  Abdominal:     General: There is distension.     Palpations: Abdomen is soft.     Tenderness: There is no abdominal tenderness.  Musculoskeletal:     Right lower leg: No swelling.     Left lower leg: No swelling.  Skin:    General: Skin is warm.     Findings: No rash.  Neurological:     Mental Status: She is alert and oriented to person, place, and time.     Data Reviewed: White blood cell count 1.4, hemoglobin 7.8, platelet count 37  Family Communication: Spoke with husband at the bedside  Disposition: Status is: Inpatient Remains inpatient appropriate because: Oncology and palliative care consultations.  Will get a paracentesis.  Planned Discharge Destination: To be determined    Time spent: 28 minutes Case discussed with oncology.  Author: Alford Highland, MD 07/06/2023 2:23 PM  For on call review [email or web address].

## 2023-07-06 NOTE — Assessment & Plan Note (Addendum)
White blood cell count 2.5.  Hemoglobin 6.7.  Platelet count 143.  Patient to receive 1 unit of packed red blood cells earlier in the hospital course.  Will give another unit of packed red blood cells today.

## 2023-07-06 NOTE — Progress Notes (Signed)
   07/06/23 0723  Assess: MEWS Score  Temp 98.1 F (36.7 C)  BP 97/64  MAP (mmHg) 74  Pulse Rate (!) 110  Resp 18  SpO2 96 %  O2 Device Room Air  Assess: MEWS Score  MEWS Temp 0  MEWS Systolic 1  MEWS Pulse 1  MEWS RR 0  MEWS LOC 0  MEWS Score 2  MEWS Score Color Yellow  Assess: if the MEWS score is Yellow or Red  Were vital signs accurate and taken at a resting state? Yes  Does the patient meet 2 or more of the SIRS criteria? Yes  Does the patient have a confirmed or suspected source of infection? Yes  MEWS guidelines implemented  Yes, yellow  Treat  MEWS Interventions Considered administering scheduled or prn medications/treatments as ordered  Take Vital Signs  Increase Vital Sign Frequency  Yellow: Q2hr x1, continue Q4hrs until patient remains green for 12hrs  Escalate  MEWS: Escalate Yellow: Discuss with charge nurse and consider notifying provider and/or RRT  Notify: Charge Nurse/RN  Name of Charge Nurse/RN Notified Grier Mitts, RN  Provider Notification  Provider Name/Title Dr. Renae Gloss  Date Provider Notified 07/06/23  Time Provider Notified 0725  Method of Notification  (secure message)  Notification Reason Change in status  Provider response See new orders  Date of Provider Response 07/06/23  Time of Provider Response 0727  Assess: SIRS CRITERIA  SIRS Temperature  0  SIRS Pulse 1  SIRS Respirations  0  SIRS WBC 0  SIRS Score Sum  1

## 2023-07-06 NOTE — Assessment & Plan Note (Addendum)
MRCP showing tumor causing obstruction of common bile duct.

## 2023-07-06 NOTE — Assessment & Plan Note (Addendum)
Please PT recommendation is home with home health.

## 2023-07-06 NOTE — Assessment & Plan Note (Addendum)
CT scan showing progression of tumor.  Patient is a DNR.  Dr. Smith Robert to see patient to discuss plan.

## 2023-07-06 NOTE — Assessment & Plan Note (Addendum)
Completed antibiotic 

## 2023-07-06 NOTE — Hospital Course (Addendum)
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  Patient has had disease progression and poor tolerance of previous chemotherapy.  Patient presenting with generalized weakness and has a positive urine culture from 10/19. Repeat CT scan shows no significant change from previous CT scan, large volume gastric mass and gastrohepatic ligament adenopathy unchanged from prior, peritoneal metastases and a large volume intraperitoneal free fluid.  Left adrenal gland metastases unchanged, small right pleural effusion.  10/24.  3.45 L drawn off from abdomen with paracentesis. 10/25.  Patient feeling a little bit better.  Hemoglobin drifted down to 7.0.  Blood transfusion ordered.  MRCP showing obstruction of common bile duct and extensive cancer. 10/26.  Hemoglobin up to 8.6.  Patient interested in hospice facility. 10/27.  Patient agreeable to home with hospice until hospice facility opens up. 10/28.  Patient seen by palliative care and looking into ERCP with stent and abdominal drainage catheter.  Interventional radiology team and GI will reevaluate for potential procedures. 10/29.  Anesthesia ordered labs.  Sodium came back at 121 so ERCP and stent was canceled.  1 dose of tolvaptan ordered.  Hemoglobin 6.7 and 1 unit of blood ordered.  Interventional radiology okay with placing an abdominal drain with a low-sodium

## 2023-07-06 NOTE — ED Notes (Addendum)
ED TO INPATIENT HANDOFF REPORT  ED Nurse Name and Phone #: Victorino Dike [phone]  U Name/Age/Gender Ariana Wilson 62 y.o. female Room/Bed: ED33A/ED33A  Code Status   Code Status: Prior  Home/SNF/Other Home Patient oriented to: self, place, time, and situation Is this baseline? Yes   Triage Complete: Triage complete  Chief Complaint E. coli UTI [N39.0, B96.20]  Triage Note Pt presents to ED with c/o of abnormal labs from the cancer center. Pt states labs drawn yesterday at [facility] and also states she had a liter of fluid PTA from cancer center. Pt states taking ABX for a UTI, pt endorses watery type diarrhea.      Allergies Allergies  Allergen Reactions   Sulfa Antibiotics Hives    Says her tongue swelled a bit   Sulfonamide Derivatives Swelling    Tongue swelling    Level of Care/Admitting Diagnosis ED Disposition     ED Disposition  Admit   Condition  --   Comment  Hospital Area: [facility] [[id number]]  Level of Care: Telemetry Medical [104]  Covid Evaluation: Asymptomatic - no recent exposure (last 10 days) testing not required  Diagnosis: E. coli UTI [132440]  Admitting Physician: Andris Baumann [[id number]]  Attending Physician: Andris Baumann [[id number]]  Certification:: I certify this patient will need inpatient services for at least 2 midnights  Expected Medical Readiness: 07/07/2023          B Medical/Surgery History Past Medical History:  Diagnosis Date   Anemia    Arthritis    Atrial fibrillation (HCC)    B12 deficiency    Cancer (HCC)    Dysrhythmia/history A. Fib    Hyperlipidemia    Hypothyroidism    Menopause    Osteoarthritis    Palpitations    Hx of, 25 years ago   Vertigo    Past Surgical History:  Procedure Laterality Date   BILATERAL SALPINGOOPHORECTOMY  2008   DILATION AND CURETTAGE OF UTERUS  08/24/2006   ESOPHAGOGASTRODUODENOSCOPY (EGD) WITH PROPOFOL N/A 03/01/2022   Procedure: ESOPHAGOGASTRODUODENOSCOPY  (EGD) WITH PROPOFOL;  Surgeon: Regis Bill, MD;  Location: [facility] ENDOSCOPY;  Service: Endoscopy;  Laterality: N/A;   ESOPHAGOGASTRODUODENOSCOPY (EGD) WITH PROPOFOL N/A 05/04/2022   Procedure: ESOPHAGOGASTRODUODENOSCOPY (EGD) WITH PROPOFOL;  Surgeon: [name], MD;  Location: [facility] ENDOSCOPY;  Service: Gastroenterology;  Laterality: N/A;   ESOPHAGOGASTRODUODENOSCOPY (EGD) WITH PROPOFOL N/A 06/01/2023   Procedure: ESOPHAGOGASTRODUODENOSCOPY (EGD) WITH PROPOFOL;  Surgeon: [name], MD;  Location: [facility] ENDOSCOPY;  Service: Gastroenterology;  Laterality: N/A;   HEMOSTASIS CONTROL  06/01/2023   Procedure: HEMOSTASIS CONTROL;  Surgeon: [name], MD;  Location: [facility] ENDOSCOPY;  Service: Gastroenterology;;   incision tendon sheath for trigger finger Right    ring finger   LAPAROSCOPIC SUPRACERVICAL HYSTERECTOMY  2008   Dr. Arvil Chaco; with BSO   NOSE SURGERY  03/2008   PORTA CATH INSERTION N/A 03/21/2022   Procedure: PORTA CATH INSERTION;  Surgeon: Annice Needy, MD;  Location: [facility] INVASIVE CV LAB;  Service: Cardiovascular;  Laterality: N/A;     A IV Location/Drains/Wounds Patient Lines/Drains/Airways Status     Active Line/Drains/Airways     Name Placement date Placement time Site Days   Implanted Port 05/17/22 Right Chest 05/17/22  0925  Chest  415   Peripheral IV 07/05/23 22 G 1" Anterior;Right Forearm 07/05/23  2045  Forearm  1            Intake/Output Last 24 hours No intake or output data  in the 24 hours ending 07/06/23 0028  Labs/Imaging Results for orders placed or performed during the hospital encounter of 07/05/23 (from the past 48 hour(s))  CBC with Differential     Status: Abnormal   Collection Time: 07/05/23  9:27 PM  Result Value Ref Range   WBC 1.5 (L) 4.0 - 10.5 K/uL   RBC 3.29 (L) 3.87 - 5.11 MIL/uL   Hemoglobin 9.8 (L) 12.0 - 15.0 g/dL   HCT 16.1 (L) 09.6 - 04.5 %   MCV 93.3 80.0 - 100.0 fL   MCH 29.8 26.0 - 34.0 pg   MCHC 31.9 30.0 - 36.0 g/dL   RDW  40.9 (H) 81.1 - 15.5 %   Platelets 50 (L) 150 - 400 K/uL    Comment: Immature Platelet Fraction may be clinically indicated, consider ordering this additional test [id number] REPEATED TO VERIFY PLATELET COUNT CONFIRMED BY SMEAR    nRBC 0.0 0.0 - 0.2 %   Neutrophils Relative % 31 %   Neutro Abs 0.5 (L) 1.7 - 7.7 K/uL   Lymphocytes Relative 66 %   Lymphs Abs 1.0 0.7 - 4.0 K/uL   Monocytes Relative 1 %   Monocytes Absolute 0.0 (L) 0.1 - 1.0 K/uL   Eosinophils Relative 1 %   Eosinophils Absolute 0.0 0.0 - 0.5 K/uL   Basophils Relative 0 %   Basophils Absolute 0.0 0.0 - 0.1 K/uL   WBC Morphology MORPHOLOGY UNREMARKABLE    RBC Morphology MIXED RBC POPULATION    Smear Review Normal platelet morphology    Immature Granulocytes 1 %   Abs Immature Granulocytes 0.02 0.00 - 0.07 K/uL    Comment: Performed at [facility], [address] [place], [place] [address]  Comprehensive metabolic panel     Status: Abnormal   Collection Time: 07/05/23  9:27 PM  Result Value Ref Range   Sodium 133 (L) 135 - 145 mmol/L   Potassium 3.9 3.5 - 5.1 mmol/L   Chloride 100 98 - 111 mmol/L   CO2 24 22 - 32 mmol/L   Glucose, Bld 115 (H) 70 - 99 mg/dL    Comment: Glucose reference range applies only to samples taken after fasting for at least 8 hours.   BUN 21 8 - 23 mg/dL   Creatinine, Ser 8.65 0.44 - 1.00 mg/dL   Calcium 8.0 (L) 8.9 - 10.3 mg/dL   Total Protein 6.2 (L) 6.5 - 8.1 g/dL   Albumin 2.8 (L) 3.5 - 5.0 g/dL   AST 784 (H) 15 - 41 U/L   ALT 285 (H) 0 - 44 U/L   Alkaline Phosphatase 1,121 (H) 38 - 126 U/L   Total Bilirubin 4.9 (H) 0.3 - 1.2 mg/dL   GFR, Estimated >69 >62 mL/min    Comment: (NOTE) Calculated using the CKD-EPI Creatinine Equation (2021)    Anion gap 9 5 - 15    Comment: Performed at [facility], [address] [place], [place] [address]  Troponin I (High Sensitivity)     Status: None   Collection Time: 07/05/23  9:27 PM  Result Value Ref Range   Troponin  I (High Sensitivity) 10 <18 ng/L    Comment: (NOTE) Elevated high sensitivity troponin I (hsTnI) values and significant  changes across serial measurements may suggest ACS but many other  chronic and acute conditions are known to elevate hsTnI results.  Refer to the "Links" section for chest pain algorithms and additional  guidance. Performed at [facility], [address]  [place], [place] [address]   Resp panel by RT-PCR (RSV, Flu A&B, Covid) Anterior Nasal Swab     Status: None   Collection Time: 07/05/23 10:50 PM   Specimen: Anterior Nasal Swab  Result Value Ref Range   SARS Coronavirus 2 by RT PCR NEGATIVE NEGATIVE    Comment: (NOTE) SARS-CoV-2 target nucleic acids are NOT DETECTED.  The SARS-CoV-2 RNA is generally detectable in upper respiratory specimens during the acute phase of infection. The lowest concentration of SARS-CoV-2 viral copies this assay can detect is 138 copies/mL. A negative result does not preclude SARS-Cov-2 infection and should not be used as the sole basis for treatment or other patient management decisions. A negative result may occur with  improper specimen collection/handling, submission of specimen other than nasopharyngeal swab, presence of viral mutation(s) within the areas targeted by this assay, and inadequate number of viral copies(<138 copies/mL). A negative result must be combined with clinical observations, patient history, and epidemiological information. The expected result is Negative.  Fact Sheet for Patients:  BloggerCourse.com  Fact Sheet for Healthcare Providers:  SeriousBroker.it  This test is no t yet approved or cleared by the [place] FDA and  has been authorized for detection and/or diagnosis of SARS-CoV-2 by FDA under an Emergency Use Authorization (EUA). This EUA will remain  in effect (meaning this test can be used) for the duration of the COVID-19  declaration under Section 564(b)(1) of the Act, 21 U.S.C.section 360bbb-3(b)(1), unless the authorization is terminated  or revoked sooner.       Influenza A by PCR NEGATIVE NEGATIVE   Influenza B by PCR NEGATIVE NEGATIVE    Comment: (NOTE) The Xpert Xpress SARS-CoV-2/FLU/RSV plus assay is intended as an aid in the diagnosis of influenza from Nasopharyngeal swab specimens and should not be used as a sole basis for treatment. Nasal washings and aspirates are unacceptable for Xpert Xpress SARS-CoV-2/FLU/RSV testing.  Fact Sheet for Patients: BloggerCourse.com  Fact Sheet for Healthcare Providers: SeriousBroker.it  This test is not yet approved or cleared by the [place] FDA and has been authorized for detection and/or diagnosis of SARS-CoV-2 by FDA under an Emergency Use Authorization (EUA). This EUA will remain in effect (meaning this test can be used) for the duration of the COVID-19 declaration under Section 564(b)(1) of the Act, 21 U.S.C. section 360bbb-3(b)(1), unless the authorization is terminated or revoked.     Resp Syncytial Virus by PCR NEGATIVE NEGATIVE    Comment: (NOTE) Fact Sheet for Patients: BloggerCourse.com  Fact Sheet for Healthcare Providers: SeriousBroker.it  This test is not yet approved or cleared by the [place] FDA and has been authorized for detection and/or diagnosis of SARS-CoV-2 by FDA under an Emergency Use Authorization (EUA). This EUA will remain in effect (meaning this test can be used) for the duration of the COVID-19 declaration under Section 564(b)(1) of the Act, 21 U.S.C. section 360bbb-3(b)(1), unless the authorization is terminated or revoked.  Performed at [facility], [address] [place], [place] [address]    DG Chest Portable 1 View  Result Date: 07/05/2023 CLINICAL DATA:  Weakness abnormal labs from  the cancer center. Pt states labs drawn yesterday at [facility] and also states she had a liter of fluid PTA from cancer center. Pt states taking ABX for a UTI, pt endorses watery type diarrhea. EXAM: PORTABLE CHEST 1 VIEW COMPARISON:  Chest x-ray 07/01/2022, CT abdomen pelvis 07/01/2023, CT chest 08/01/2022 FINDINGS: Axis right chest wall dialysis catheter with tip overlying the  superior cavoatrial junction. The heart and mediastinal contours are unchanged. Aortic calcification. No focal consolidation. No pulmonary edema. Left base atelectasis. Trace to small volume left pleural effusion. No pneumothorax. No acute osseous abnormality. IMPRESSION: *Trace to small volume left pleural effusion. * Aortic Atherosclerosis (ICD10-I70.0). Electronically Signed   By: Tish Frederickson M.D.   On: 07/05/2023 22:19   US Paracentesis  Result Date: 07/05/2023 INDICATION: 62 year old female. History of adenocarcinoma of the GE junction. Found to have ascites. Request is for therapeutic and diagnostic paracentesis EXAM: ULTRASOUND GUIDED THERAPEUTIC AND DIAGNOSTIC PARACENTESIS MEDICATIONS: Lidocaine 1% 10 mL COMPLICATIONS: None immediate. PROCEDURE: Informed written consent was obtained from the patient after a discussion of the risks, benefits and alternatives to treatment. A timeout was performed prior to the initiation of the procedure. Initial ultrasound scanning demonstrates a small amount of ascites within the right lower abdominal quadrant. The right lower abdomen was prepped and draped in the usual sterile fashion. 1% lidocaine was used for local anesthesia. Following this, a 19 gauge, 10-cm, Yueh catheter was introduced. An ultrasound image was saved for documentation purposes. The paracentesis was performed. The catheter was removed and a dressing was applied. The patient tolerated the procedure well without immediate post procedural complication. FINDINGS: A total of approximately 2.3 L of cloudy yellow fluid was  removed. Samples were sent to the laboratory as requested by the clinical team. IMPRESSION: Successful ultrasound-guided therapeutic and diagnostic paracentesis yielding 2.3 liters of peritoneal fluid. Performed by Anders Grant NP Electronically Signed   By: Olive Bass M.D.   On: 07/05/2023 08:42    Pending Labs Unresulted Labs (From admission, onward)     Start     Ordered   07/05/23 1949  Urinalysis, Complete w Microscopic -Urine, Clean Catch  Once,   URGENT       Question:  Specimen Source  Answer:  Urine, Clean Catch   07/05/23 1948   07/05/23 1949  Blood culture (routine x 2)  BLOOD CULTURE X 2,   STAT      07/05/23 1948            Vitals/Pain Today's Vitals   07/05/23 2055 07/05/23 2145 07/05/23 2200 07/06/23 0016  BP:    120/70  Pulse: (!) 106 (!) 108  (!) 111  Resp: 15     Temp:   97.6 F (36.4 C)   TempSrc:   Axillary   SpO2: 97% 98%  98%  Weight:      Height:      PainSc:        Isolation Precautions No active isolations  Medications Medications  sodium chloride 0.9 % bolus 1,000 mL (0 mLs Intravenous Stopped 07/05/23 2120)  cefTRIAXone (ROCEPHIN) 1 g in sodium chloride 0.9 % 100 mL IVPB (0 g Intravenous Stopped 07/05/23 2120)    Mobility walks     Focused Assessments A/ox4, ambulatory steady with standby GU- no complaints of urinary symptoms at this time   R Recommendations: See Admitting Provider Note  Report given to:   Additional Notes: Just urinated unsuccessful on getting sample.

## 2023-07-06 NOTE — Assessment & Plan Note (Signed)
Continue fentanyl patch and oxycodone

## 2023-07-06 NOTE — Progress Notes (Signed)
Physical Therapy Evaluation Patient Details Name: Ariana Wilson MRN: [id number] DOB: [birth year] Today's Date: 07/06/2023  History of Present Illness  Ariana Wilson is a 62 y.o. 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 coli from 10/19.   Clinical Impression  Patient supine in bed upon arrival, agreeable to PT evaluation this date. PTA, patient and spouse reports she was Mod I with mobility and ADLs without AD use. On evaluation, patient was Mod I with bed mobility (increased time required), vitals stable. With functional transfers and ambulation, patient require close supervision to CGA due to mild unsteadiness/weakness, no AD utilized. Pt tolerated ~ 25 ft with rest break to/from toilet; unable to complete additional distance due to fatigue. Pt request to return to bed, therefore returned supine with all needs in reach, SCD's reapplied, and spouse at bedside. Patient presents with generalized weakness and decreased activity tolerance. Patient will benefit from acute skilled PT services to address impairments (see below for additional details), and to maximize functional mobility. Will continue to follow acutely.       If plan is discharge home, recommend the following: A little help with walking and/or transfers;Assist for transportation   Can travel by private vehicle        Equipment Recommendations Other (comment) (TBD)  Recommendations for Other Services       Functional Status Assessment Patient has had a recent decline in their functional status and demonstrates the ability to make significant improvements in function in a reasonable and predictable amount of time.     Precautions / Restrictions Precautions Precautions: Fall Restrictions Weight Bearing Restrictions: No      Mobility  Bed  Mobility Overal bed mobility: Modified Independent             General bed mobility comments: Supine > sit with HOB elevated, Mod I, increased time required for completion and use of rails, but able to complete w/o physical assistance. Sit > supine Mod I. HR 113-115 seated EOB.    Transfers Overall transfer level: Needs assistance Equipment used: None Transfers: Sit to/from Stand Sit to Stand: Supervision, Contact guard assist           General transfer comment: Pt able to stand from EOB with supervision, denies lightheadedness/dizziness upon standing. From lower height surface (toilet) require CGA)    Ambulation/Gait Ambulation/Gait assistance: Contact guard assist Gait Distance (Feet): 25 Feet Assistive device: None   Gait velocity: Decreased     General Gait Details: no AD utilized for ambulation, patient able to complete approx 25 ft during session to/from toilet with CGA for steadying assist. Patient with significant fatigue limiting ambulation distance this date. HR: mid 120's with ambulation.  Stairs            Wheelchair Mobility     Tilt Bed    Modified Rankin (Stroke Patients Only)       Balance Overall balance assessment: Needs assistance Sitting-balance support: Feet supported, No upper extremity supported Sitting balance-Leahy Scale: Normal     Standing balance support: During functional activity Standing balance-Leahy Scale: Good Standing balance comment: Patient CGA for standing balance/mobility due to unsteadiness/fatigue. May benefit from AD trial to help with balance and conserve energy with ambulation activities  Pertinent Vitals/Pain Pain Assessment Pain Assessment: No/denies pain    Home Living Family/patient expects to be discharged to:: Private residence Living Arrangements: Spouse/significant other;Children;Other relatives Available Help at Discharge: Family Type of Home: House Home  Access: Level entry       Home Layout: One level Home Equipment: Pharmacist, hospital (2 wheels)      Prior Function Prior Level of Function : Independent/Modified Independent             Mobility Comments: Pt reports was IND with ambulation without use of AD; just seems to fatigue quickly ADLs Comments: Mod I with ADLs; Husband assist with IADLs     Extremity/Trunk Assessment        Lower Extremity Assessment Lower Extremity Assessment: Generalized weakness       Communication      Cognition Arousal: Lethargic Behavior During Therapy: Flat affect Overall Cognitive Status: Within Functional Limits for tasks assessed                                          General Comments      Exercises Other Exercises Other Exercises: Pt able to complete toilet hygiene this date without assist; supervision   Assessment/Plan    PT Assessment Patient needs continued PT services  PT Problem List Decreased strength;Decreased activity tolerance;Decreased mobility;Decreased balance       PT Treatment Interventions DME instruction;Gait training;Functional mobility training;Therapeutic activities;Balance training;Therapeutic exercise;Neuromuscular re-education    PT Goals (Current goals can be found in the Care Plan section)  Acute Rehab PT Goals Patient Stated Goal: get home PT Goal Formulation: With patient Time For Goal Achievement: 07/20/23 Potential to Achieve Goals: Good    Frequency Min 1X/week     Co-evaluation               AM-PAC PT "6 Clicks" Mobility  Outcome Measure Help needed turning from your back to your side while in a flat bed without using bedrails?: None Help needed moving from lying on your back to sitting on the side of a flat bed without using bedrails?: None Help needed moving to and from a bed to a chair (including a wheelchair)?: A Little Help needed standing up from a chair using your arms (e.g., wheelchair or  bedside chair)?: A Little Help needed to walk in hospital room?: A Little Help needed climbing 3-5 steps with a railing? : A Little 6 Click Score: 20    End of Session Equipment Utilized During Treatment: Gait belt Activity Tolerance: Patient limited by fatigue Patient left: in bed;with call bell/phone within reach;with family/visitor present;with SCD's reapplied Nurse Communication: Mobility status PT Visit Diagnosis: Unsteadiness on feet (R26.81);Muscle weakness (generalized) (M62.81)    Time: 5284-1324 PT Time Calculation (min) (ACUTE ONLY): 23 min   Charges:   PT Evaluation $PT Eval Low Complexity: 1 Low   PT General Charges $$ ACUTE PT VISIT: 1 Visit         Creed Copper Fairly, PT, DPT 07/06/23 1:40 PM

## 2023-07-06 NOTE — Plan of Care (Signed)
  Problem: Education: Goal: Knowledge of General Education information will improve Description: Including pain rating scale, medication(s)/side effects and non-pharmacologic comfort measures Outcome: Completed/Met   Problem: Activity: Goal: Risk for activity intolerance will decrease Outcome: Completed/Met   Problem: Coping: Goal: Level of anxiety will decrease Outcome: Completed/Met   Problem: Safety: Goal: Ability to remain free from injury will improve Outcome: Completed/Met   Problem: Skin Integrity: Goal: Risk for impaired skin integrity will decrease Outcome: Completed/Met   Problem: Education: Goal: Knowledge of General Education information will improve Description: Including pain rating scale, medication(s)/side effects and non-pharmacologic comfort measures Outcome: Completed/Met

## 2023-07-06 NOTE — Consult Note (Addendum)
[name] , MD [address], [place], [place], [address] [address], [place], [place], [address] Phone: [phone]  Fax: [phone]  Consultation  Referring Provider:     Dr Para March  Primary Care Physician:  Danella Penton, MD Primary Gastroenterologist:  Dr. Mia Creek          Reason for Consultation:    Abnormal LFTs  Date of Admission:  07/05/2023 Date of Consultation:  07/06/2023         HPI:   Ariana Wilson is a 62 y.o. 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  Admitted with weakness and positive for UTICT scan of the abdomen performed today showed large volume gastric mass and gastrohepatic ligament adenopathy unchanged from prior peritoneal metastasis and large volume intraperitoneal free fluid not changed.   I have been called for abnormal liver function tests.  LFTs show elevated alkaline phosphate of 824, total bilirubin of 4 AST of 154 ALT of 217 albumin of 2.1.  White cell count 1.4, hemoglobin 7.8, platelet count 37  She denies any pain or new symptoms   Past Medical History:  Diagnosis Date   Anemia    Arthritis    Atrial fibrillation (HCC)    B12 deficiency    Cancer (HCC)    Dysrhythmia/history A. Fib    Hyperlipidemia    Hypothyroidism    Menopause    Osteoarthritis    Palpitations    Hx of, 25 years ago   Vertigo     Past Surgical History:  Procedure Laterality Date   BILATERAL SALPINGOOPHORECTOMY  2008   DILATION AND CURETTAGE OF UTERUS  08/24/2006   ESOPHAGOGASTRODUODENOSCOPY (EGD) WITH PROPOFOL N/A 03/01/2022   Procedure: ESOPHAGOGASTRODUODENOSCOPY (EGD) WITH PROPOFOL;  Surgeon: Regis Bill,  MD;  Location: [facility] ENDOSCOPY;  Service: Endoscopy;  Laterality: N/A;   ESOPHAGOGASTRODUODENOSCOPY (EGD) WITH PROPOFOL N/A 05/04/2022   Procedure: ESOPHAGOGASTRODUODENOSCOPY (EGD) WITH PROPOFOL;  Surgeon: [name], MD;  Location: [facility] ENDOSCOPY;  Service: Gastroenterology;  Laterality: N/A;   ESOPHAGOGASTRODUODENOSCOPY (EGD) WITH PROPOFOL N/A 06/01/2023   Procedure: ESOPHAGOGASTRODUODENOSCOPY (EGD) WITH PROPOFOL;  Surgeon: [name], MD;  Location: [facility] ENDOSCOPY;  Service: Gastroenterology;  Laterality: N/A;   HEMOSTASIS CONTROL  06/01/2023   Procedure: HEMOSTASIS CONTROL;  Surgeon: [name], MD;  Location: [facility] ENDOSCOPY;  Service: Gastroenterology;;   incision tendon sheath for trigger finger Right    ring finger   LAPAROSCOPIC SUPRACERVICAL HYSTERECTOMY  2008   Dr. Arvil Chaco; with BSO   NOSE SURGERY  03/2008   PORTA CATH INSERTION N/A 03/21/2022   Procedure: PORTA CATH INSERTION;  Surgeon: Annice Needy, MD;  Location: [facility] INVASIVE CV LAB;  Service: Cardiovascular;  Laterality: N/A;    Prior to Admission medications   Medication Sig Start Date End Date Taking? Authorizing Provider  Calcium Carbonate-Vitamin D 600-200 MG-UNIT TABS Take 1 tablet by mouth daily.   Patient not taking: Reported on 06/13/2023    [provider]  cephALEXin (KEFLEX) 500 MG capsule Take 1 capsule (500 mg total) by mouth 3 (three) times daily for 5 days. 07/02/23 07/07/23  Delton Prairie, MD  cetirizine (ZYRTEC) 10 MG tablet Take 10 mg by mouth daily. Patient  not taking: Reported on 06/13/2023    [provider]  Cholecalciferol 25 MCG (1000 UT) tablet Take 1,000 Units by mouth daily. Patient not taking: Reported on 06/13/2023    [provider]  Cobalamin Combinations (B-12 PLUS FOLIC ACID PO)     [provider]  cyanocobalamin (VITAMIN B12) 1000 MCG tablet Take 1,000 mcg by mouth daily.    [provider]  dexamethasone (DECADRON) 4 MG tablet Take 2 tablets (8 mg  total) by mouth daily. Start the day after chemotherapy for 2 days. Take with food. 05/22/23   Creig Hines, MD  dicyclomine (BENTYL) 20 MG tablet Take 20 mg by mouth 2 (two) times daily.    [provider]  diphenoxylate-atropine (LOMOTIL) 2.5-0.025 MG tablet Take 2 tablets by mouth every 6 (six) hours as needed for diarrhea or loose stools. 06/13/23   Alinda Dooms, NP  etodolac (LODINE) 400 MG tablet Take 400 mg by mouth 2 (two) times daily. 02/22/23 02/22/24  [provider]  famotidine (PEPCID) 40 MG tablet Take 1 tablet by mouth at bedtime. Patient not taking: Reported on 06/13/2023 10/21/22 10/21/23  [provider]  fentaNYL (DURAGESIC) 25 MCG/HR Place 1 patch onto the skin every 3 (three) days. 07/04/23   Alinda Dooms, NP  flecainide (TAMBOCOR) 50 MG tablet Take 1 tablet by mouth 2 (two) times daily. 07/09/20   [provider]  lactulose (CHRONULAC) 10 GM/15ML solution Take 15-30 mLs (10-20 g total) by mouth 3 (three) times daily as needed for mild constipation. 06/30/23   Borders, Daryl Eastern, NP  lidocaine-prilocaine (EMLA) cream Apply to affected area once Patient not taking: Reported on 06/13/2023 05/22/23   Creig Hines, MD  loperamide (IMODIUM) 2 MG capsule Take 2 tabs by mouth with first loose stool, then 1 tab with each additional loose stool as needed. Do not exceed 8 tabs in a 24-hour period Patient not taking: Reported on 05/29/2023 05/22/23   Creig Hines, MD  Melatonin 10 MG TABS Take by mouth. Patient not taking: Reported on 06/20/2023    [provider]  metoprolol tartrate (LOPRESSOR) 25 MG tablet Take 25 mg by mouth 2 (two) times daily as needed.    [provider]  midodrine (PROAMATINE) 10 MG tablet Take by mouth. 05/26/22   [provider]  mometasone (NASONEX) 50 MCG/ACT nasal spray Place 2 sprays into the nose daily.    [provider]  OLANZapine (ZYPREXA) 10 MG tablet Take 1 tablet (10 mg total) by mouth  at bedtime. 06/08/23   Borders, Daryl Eastern, NP  ondansetron (ZOFRAN) 8 MG tablet Take 1 tablet (8 mg total) by mouth every 8 (eight) hours as needed for nausea, vomiting or refractory nausea / vomiting. Start on the third day after chemotherapy. 05/22/23   Creig Hines, MD  oxyCODONE (OXY IR/ROXICODONE) 5 MG immediate release tablet Take 1 tablet (5 mg total) by mouth every 4 (four) hours as needed for severe pain. 05/19/23   Creig Hines, MD  pantoprazole (PROTONIX) 40 MG tablet Take 40 mg by mouth daily. Patient not taking: Reported on 06/13/2023    [provider]  polyethylene glycol (MIRALAX / GLYCOLAX) 17 g packet Take by mouth. Patient not taking: Reported on 06/13/2023    [provider]  promethazine (PHENERGAN) 25 MG tablet Take 1 tablet (25 mg total) by mouth every 8 (eight) hours as needed for refractory nausea / vomiting. May cause sleepiness. Patient not taking: Reported  on 06/20/2023 06/13/23   Alinda Dooms, NP  psyllium (METAMUCIL) 58.6 % powder Take 1 packet by mouth 3 (three) times daily. Patient not taking: Reported on 06/13/2023    [provider]  Saccharomyces boulardii (PROBIOTIC) 250 MG CAPS Take 1 capsule by mouth daily. Patient not taking: Reported on 06/13/2023    [provider]  simvastatin (ZOCOR) 20 MG tablet Take 1 tablet by mouth at bedtime. Patient not taking: Reported on 06/13/2023 08/10/22   [provider]  sucralfate (CARAFATE) 1 g tablet Take 1 g by mouth 4 (four) times daily. 05/04/23   [provider]  traZODone (DESYREL) 50 MG tablet Take 50 mg by mouth at bedtime as needed. 04/24/23   [provider]  Zinc Citrate-Phytase (ZYTAZE) 25-500 MG CAPS Take 1 Dose by mouth daily. Patient not taking: Reported on 06/13/2023    [provider]    Family History  Problem Relation Age of Onset   Hyperlipidemia Mother    Hypertension Mother    Stroke Mother    Heart disease Father    Hyperlipidemia  Father    Atrial fibrillation Father    Melanoma Father    Arrhythmia Sister        Atrial fibrillation   Heart disease Sister    Atrial fibrillation Sister    Heart attack Brother    Breast cancer Neg Hx      Social History   Tobacco Use   Smoking status: Never   Smokeless tobacco: Never  Vaping Use   Vaping status: Never Used  Substance Use Topics   Alcohol use: Never   Drug use: Never    Allergies as of 07/05/2023 - Review Complete 07/05/2023  Allergen Reaction Noted   Sulfa antibiotics Hives 12/06/2018   Sulfonamide derivatives Swelling 02/18/2010    Review of Systems:    All systems reviewed and negative except where noted in HPI.   Physical Exam:  Vital signs in last 24 hours: Temp:  [97.6 F (36.4 C)-98.6 F (37 C)] 98.1 F (36.7 C) (10/24 0723) Pulse Rate:  [62-118] 110 (10/24 0723) Resp:  [15-20] 18 (10/24 0723) BP: (97-131)/(64-80) 97/64 (10/24 0723) SpO2:  [95 %-99 %] 96 % (10/24 0723) Weight:  [67.6 kg] 67.6 kg (10/23 1526)   General:   Pleasant, cooperative in NAD Head:  Normocephalic and atraumatic. Eyes:  icterus++   Conjunctiva pink. PERRLA.  Psych:  Alert and cooperative. Normal affect.  LAB RESULTS: Recent Labs    07/04/23 1102 07/05/23 2127 07/06/23 0602  WBC 3.5* 1.5* 1.4*  HGB 9.6* 9.8* 7.8*  HCT 29.9* 30.7* 23.8*  PLT 79* 50* 37*   BMET Recent Labs    07/04/23 1102 07/05/23 2127 07/06/23 0602  NA 130* 133* 132*  K 4.5 3.9 4.0  CL 99 100 103  CO2 25 24 23   GLUCOSE 105* 115* 99  BUN 25* 21 22  CREATININE 0.86 0.67 0.53  CALCIUM 8.2* 8.0* 7.6*   LFT Recent Labs    07/06/23 0602  PROT 4.6*  ALBUMIN 2.1*  AST 154*  ALT 217*  ALKPHOS 824*  BILITOT 4.0*   PT/INR Recent Labs    07/06/23 0602  LABPROT 15.3*  INR 1.2    STUDIES: CT ABDOMEN PELVIS WO CONTRAST  Result Date: 07/06/2023 CLINICAL DATA:  Abdominal pain and flank pain. RIGHT lower quadrant pain. Ongoing chemotherapy. Esophageal cancer. * Tracking  Code: BO * is EXAM: CT ABDOMEN AND PELVIS WITHOUT CONTRAST TECHNIQUE: Multidetector CT imaging of  the abdomen and pelvis was performed following the standard protocol without IV contrast. RADIATION DOSE REDUCTION: This exam was performed according to the departmental dose-optimization program which includes automated exposure control, adjustment of the mA and/or kV according to patient size and/or use of iterative reconstruction technique. COMPARISON:  CT 07/01/2019 FINDINGS: Lower chest: Small LEFT pleural effusion Hepatobiliary: No focal hepatic lesion identified on noncontrast exam gallbladder collapsed. No biliary duct dilatation evident. Pancreas: No pancreatic inflammation identified. Spleen: Normal spleen Adrenals/urinary tract: Probable LEFT adrenal gland metastasis unchanged measuring 2.7 x 4.1 cm. No nephrolithiasis or ureterolithiasis. No bladder calculi. Stomach/Bowel: Mass in the gastric cardiac region and gastrohepatic lumen is less well-defined on noncontrast CT. No small bowel dilatation. Normal colon. Vascular/Lymphatic: Multiple periaortic lymph nodes in the upper abdomen unchanged from prior. Reproductive: Unremarkable Other: Large volume intraperitoneal free fluid unchanged from comparison exam. Peritoneal implant in ventral abdomen not changed at 18 mm (image 30/2) Musculoskeletal: No aggressive osseous lesion. IMPRESSION: 1. No nephrolithiasis ureterolithiasis or obstructive uropathy. 2. No significant interval change from CT 07/01/2023. No IV contrast 3. Large volume gastric mass and gastrohepatic ligament adenopathy unchanged from prior. 4. Peritoneal metastasis and large volume intraperitoneal free fluid not changed. 5. LEFT adrenal gland metastasis unchanged. 6. Small RIGHT pleural effusion. Electronically Signed   By: Genevive Bi M.D.   On: 07/06/2023 08:32   DG Chest Portable 1 View  Result Date: 07/05/2023 CLINICAL DATA:  Weakness abnormal labs from the cancer center. Pt states  labs drawn yesterday at [facility] center and also states she had a liter of fluid PTA from cancer center. Pt states taking ABX for a UTI, pt endorses watery type diarrhea. EXAM: PORTABLE CHEST 1 VIEW COMPARISON:  Chest x-ray 07/01/2022, CT abdomen pelvis 07/01/2023, CT chest 08/01/2022 FINDINGS: Axis right chest wall dialysis catheter with tip overlying the superior cavoatrial junction. The heart and mediastinal contours are unchanged. Aortic calcification. No focal consolidation. No pulmonary edema. Left base atelectasis. Trace to small volume left pleural effusion. No pneumothorax. No acute osseous abnormality. IMPRESSION: *Trace to small volume left pleural effusion. * Aortic Atherosclerosis (ICD10-I70.0). Electronically Signed   By: Tish Frederickson M.D.   On: 07/05/2023 22:19   US Paracentesis  Result Date: 07/05/2023 INDICATION: 62 year old female. History of adenocarcinoma of the GE junction. Found to have ascites. Request is for therapeutic and diagnostic paracentesis EXAM: ULTRASOUND GUIDED THERAPEUTIC AND DIAGNOSTIC PARACENTESIS MEDICATIONS: Lidocaine 1% 10 mL COMPLICATIONS: None immediate. PROCEDURE: Informed written consent was obtained from the patient after a discussion of the risks, benefits and alternatives to treatment. A timeout was performed prior to the initiation of the procedure. Initial ultrasound scanning demonstrates a small amount of ascites within the right lower abdominal quadrant. The right lower abdomen was prepped and draped in the usual sterile fashion. 1% lidocaine was used for local anesthesia. Following this, a 19 gauge, 10-cm, Yueh catheter was introduced. An ultrasound image was saved for documentation purposes. The paracentesis was performed. The catheter was removed and a dressing was applied. The patient tolerated the procedure well without immediate post procedural complication. FINDINGS: A total of approximately 2.3 L of cloudy yellow fluid was removed. Samples were sent to the  laboratory as requested by the clinical team. IMPRESSION: Successful ultrasound-guided therapeutic and diagnostic paracentesis yielding 2.3 liters of peritoneal fluid. Performed by Anders Grant NP Electronically Signed   By: Olive Bass M.D.   On: 07/05/2023 08:42      Impression / Plan:   Ariana Wilson is a  62 y.o. y/o female with metastatic neuroendocrine tumor of the esophagus admitted with UTI weakness.Prior abdominal imaging has demonstrated metastatic disease with lesions in the liver per CT scan on 07/01/2023 progression of the disease increased intrahepatic and extrahepatic biliary prominence with the common bile duct 9 mm on the CT scan.,  Masslike thickening at the GE junction extending into the proximal stomach also noted.  Impression   Abnormal LFTs likely due to metastatic disease of the liver.  It is possible there is a biliary compression as her CBD was dilated on CT abdomen 07/01/2023  and to determine if this could be a stentable lesion by ERCP or PTC would require an MRCP.  Overall I think the bigger picture would be to discuss goals of care with the patient and find out how much she would like to be done and based on that can proceed with the next steps. I have discussed plan with Dr Smith Robert her oncologist .   I will sign off.  Please call me if any further GI concerns or questions.  We would like to thank you for the opportunity to participate in the care of Ariana Wilson.   Thank you for involving me in the care of this patient.      LOS: 1 day   [name], MD  07/06/2023, 8:52 AM

## 2023-07-06 NOTE — IPAL (Signed)
  Interdisciplinary Goals of Care Family Meeting   Date carried out: 07/06/2023  Location of the meeting: Bedside  Member's involved: Physician and Family Member or next of kin  Durable Power of Attorney or Environmental health practitioner: husband    Discussion: We discussed goals of care for [employer] .   I have reviewed medical records including EPIC notes, labs and imaging, assessed the patient and then met with patient and husband to discuss major active diagnoses, plan of care, natural trajectory, prognosis, GOC, EOL wishes, disposition and options including Full code/DNI/DNR and the concept of comfort care if DNR is elected. Questions and concerns were addressed. They are  in agreement to continue current plan of care . Election for DNR/DNI status.   Code status:   Code Status: Limited: Do not attempt resuscitation (DNR) -DNR-LIMITED -Do Not Intubate/DNI    Disposition: Continue current acute care  Time spent for the meeting: 30    Andris Baumann, MD  07/06/2023, 5:28 AM

## 2023-07-06 NOTE — Assessment & Plan Note (Addendum)
MRCP showing obstructing common bile duct secondary to tumor.  I discussed the possibility of ERCP and stent.  Patient decided not to undergo any procedure at this time.  Patient not having any itching at this time.

## 2023-07-06 NOTE — Assessment & Plan Note (Deleted)
- 

## 2023-07-06 NOTE — Consult Note (Signed)
Palliative Medicine [facility] at [facility] [phone] [phone]   Name: Ariana Wilson Date: 07/06/2023 MRN: [id number]  DOB: [birth year]  Patient Care Team: Danella Penton, MD as PCP - General (Internal Medicine) Benita Gutter, RN as Oncology Nurse Navigator Creig Hines, MD as Consulting Physician (Oncology)    REASON FOR CONSULTATION: Ariana Wilson is a 62 y.o. 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  She was diagnosed with cancer in June 2023.  Patient has had disease progression and poor tolerance of previous chemotherapy.  She was admitted to the hospital on 07/05/2023 with E. coli UTI and CT scan showing progression of tumor.  Palliative care is consulted to address goals. .   SOCIAL HISTORY:     reports that she has never smoked. She has never used smokeless tobacco. She reports that she does not drink alcohol and does not use drugs.  Patient is married and lives at home with her husband.  She is retired Runner, broadcasting/film/video.  ADVANCE DIRECTIVES:  One file  CODE STATUS: DNR  PAST MEDICAL HISTORY: Past Medical History:  Diagnosis Date   Anemia    Arthritis    Atrial fibrillation (HCC)    B12 deficiency    Cancer (HCC)    Dysrhythmia/history A. Fib    Hyperlipidemia    Hypothyroidism    Menopause    Osteoarthritis    Palpitations    Hx of, 25 years ago   Vertigo     PAST SURGICAL HISTORY:  Past Surgical History:  Procedure Laterality Date   BILATERAL SALPINGOOPHORECTOMY  2008   DILATION AND CURETTAGE OF UTERUS  08/24/2006   ESOPHAGOGASTRODUODENOSCOPY (EGD) WITH PROPOFOL N/A 03/01/2022   Procedure: ESOPHAGOGASTRODUODENOSCOPY (EGD) WITH PROPOFOL;  Surgeon: Regis Bill, MD;  Location: [facility] ENDOSCOPY;  Service: Endoscopy;  Laterality: N/A;   ESOPHAGOGASTRODUODENOSCOPY (EGD) WITH PROPOFOL N/A 05/04/2022   Procedure: ESOPHAGOGASTRODUODENOSCOPY (EGD) WITH  PROPOFOL;  Surgeon: [name], MD;  Location: [facility] ENDOSCOPY;  Service: Gastroenterology;  Laterality: N/A;   ESOPHAGOGASTRODUODENOSCOPY (EGD) WITH PROPOFOL N/A 06/01/2023   Procedure: ESOPHAGOGASTRODUODENOSCOPY (EGD) WITH PROPOFOL;  Surgeon: [name], MD;  Location: [facility] ENDOSCOPY;  Service: Gastroenterology;  Laterality: N/A;   HEMOSTASIS CONTROL  06/01/2023   Procedure: HEMOSTASIS CONTROL;  Surgeon: [name], MD;  Location: [facility] ENDOSCOPY;  Service: Gastroenterology;;   incision tendon sheath for trigger finger Right    ring finger   LAPAROSCOPIC SUPRACERVICAL HYSTERECTOMY  2008   Dr. Arvil Chaco; with BSO   NOSE SURGERY  03/2008   PORTA CATH INSERTION N/A 03/21/2022   Procedure: PORTA CATH INSERTION;  Surgeon: Annice Needy, MD;  Location: [facility] INVASIVE CV LAB;  Service: Cardiovascular;  Laterality: N/A;    HEMATOLOGY/ONCOLOGY HISTORY:  Oncology History  Primary malignant neuroendocrine tumor of esophagus (HCC)  03/20/2022 Initial Diagnosis   Primary malignant neuroendocrine neoplasm of esophagus (HCC)   03/20/2022 Cancer Staging   Staging form: Esophagus - Other Histologies, AJCC 8th Edition - Clinical stage from 03/20/2022: cTX, cN1, cM1, G3 - Signed by Creig Hines, MD on 03/20/2022 Histopathologic type: Large cell neuroendocrine carcinoma Histologic grading system: 3 grade system   03/30/2022 - 04/29/2022 Chemotherapy   Patient is on Treatment Plan : NEUROENDOCRINE GE junctionCisplatin/Tecentriq D1 + Etoposide D1-3 q21d     05/17/2022 - 09/08/2022 Chemotherapy   Patient is on Treatment Plan : GASTROESOPHAGEAL FOLFOX q14d      06/06/2023 -  Chemotherapy  Patient is on Treatment Plan : GE junction adenocarcinoma FOLFIRI q14d     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  MEDICATIONS:  Current Facility-Administered Medications  Medication Dose Route Frequency Provider Last Rate Last Admin   acetaminophen (TYLENOL) tablet 650 mg  650 mg Oral Q6H PRN Andris Baumann, MD   650 mg at 07/06/23 0214   Or   acetaminophen (TYLENOL) suppository 650 mg  650 mg Rectal Q6H PRN Andris Baumann, MD       albumin human 25 % solution 25 g  25 g Intravenous Once Alford Highland, MD       cefTRIAXone (ROCEPHIN) 1 g in sodium chloride 0.9 % 100 mL IVPB  1 g Intravenous Q24H Andris Baumann, MD       Chlorhexidine Gluconate Cloth 2 % PADS 6 each  6 each Topical Daily Alford Highland, MD   6 each at 07/06/23 1553   dicyclomine (BENTYL) tablet 20 mg  20 mg Oral BID Andris Baumann, MD   20 mg at 07/06/23 1007   diphenoxylate-atropine (LOMOTIL) 2.5-0.025 MG per tablet 2 tablet  2 tablet Oral Q6H PRN Andris Baumann, MD       fentaNYL (DURAGESIC) 25 MCG/HR 1 patch  1 patch Transdermal Q72H Andris Baumann, MD   1 patch at 07/06/23 1400   flecainide (TAMBOCOR) tablet 50 mg  50 mg Oral Q breakfast Alford Highland, MD   50 mg at 07/06/23 1006   HYDROcodone-acetaminophen (NORCO/VICODIN) 5-325 MG per tablet 1-2 tablet  1-2 tablet Oral Q4H PRN Andris Baumann, MD       lactulose (CHRONULAC) 10 GM/15ML solution 10-20 g  10-20 g Oral TID PRN Andris Baumann, MD       metoprolol succinate (TOPROL-XL) 24 hr tablet 12.5 mg  12.5 mg Oral QPM Wieting, Richard, MD       midodrine (PROAMATINE) tablet 5 mg  5 mg Oral TID WC Alford Highland, MD       morphine (PF) 2 MG/ML injection 2 mg  2 mg Intravenous Q2H PRN Andris Baumann, MD       OLANZapine (ZYPREXA) tablet 10 mg  10 mg Oral QHS Andris Baumann, MD   10 mg at 07/06/23 0206   ondansetron (ZOFRAN) tablet 4 mg  4 mg Oral Q6H PRN Andris Baumann, MD       Or   ondansetron [facility]) injection 4 mg  4 mg Intravenous Q6H PRN Andris Baumann, MD       oxyCODONE (Oxy IR/ROXICODONE)  immediate release tablet 5 mg  5 mg Oral Q4H PRN Andris Baumann, MD       traZODone (DESYREL) tablet 50 mg  50 mg Oral QHS PRN Andris Baumann, MD       Facility-Administered Medications Ordered in Other Encounters  Medication Dose Route Frequency Provider Last Rate Last Admin   heparin lock flush 100 UNIT/ML injection             VITAL SIGNS: BP 107/69 (BP Location: Left Arm)   Pulse (!) 110   Temp 98.6 F (37 C) (Oral)   Resp 18   Ht 5' (1.524 m)  Wt 149 lb (67.6 kg)   SpO2 95%   BMI 29.10 kg/m  Filed Weights   07/05/23 1526  Weight: 149 lb (67.6 kg)    Estimated body mass index is 29.1 kg/m as calculated from the following:   Height as of this encounter: 5' (1.524 m).   Weight as of this encounter: 149 lb (67.6 kg).  LABS: CBC:    Component Value Date/Time   WBC 1.4 (LL) 07/06/2023 0602   HGB 7.8 (L) 07/06/2023 0602   HGB 9.6 (L) 07/04/2023 1102   HCT 23.8 (L) 07/06/2023 0602   PLT 37 (L) 07/06/2023 0602   PLT 79 (L) 07/04/2023 1102   MCV 92.6 07/06/2023 0602   NEUTROABS 0.5 (L) 07/05/2023 2127   LYMPHSABS 1.0 07/05/2023 2127   MONOABS 0.0 (L) 07/05/2023 2127   EOSABS 0.0 07/05/2023 2127   BASOSABS 0.0 07/05/2023 2127   Comprehensive Metabolic Panel:    Component Value Date/Time   NA 132 (L) 07/06/2023 0602   K 4.0 07/06/2023 0602   CL 103 07/06/2023 0602   CO2 23 07/06/2023 0602   BUN 22 07/06/2023 0602   CREATININE 0.53 07/06/2023 0602   CREATININE 0.86 07/04/2023 1102   GLUCOSE 99 07/06/2023 0602   CALCIUM 7.6 (L) 07/06/2023 0602   AST 154 (H) 07/06/2023 0602   AST 203 (HH) 06/27/2023 1013   ALT 217 (H) 07/06/2023 0602   ALT 150 (H) 06/27/2023 1013   ALKPHOS 824 (H) 07/06/2023 0602   BILITOT 4.0 (H) 07/06/2023 0602   BILITOT 1.1 06/27/2023 1013   PROT 4.6 (L) 07/06/2023 0602   PROT 6.6 03/06/2015 0910   ALBUMIN 2.1 (L) 07/06/2023 0602   ALBUMIN 4.6 03/06/2015 0910    RADIOGRAPHIC STUDIES: CT ABDOMEN PELVIS WO CONTRAST  Result Date:  07/06/2023 CLINICAL DATA:  Abdominal pain and flank pain. RIGHT lower quadrant pain. Ongoing chemotherapy. Esophageal cancer. * Tracking Code: BO * is EXAM: CT ABDOMEN AND PELVIS WITHOUT CONTRAST TECHNIQUE: Multidetector CT imaging of the abdomen and pelvis was performed following the standard protocol without IV contrast. RADIATION DOSE REDUCTION: This exam was performed according to the departmental dose-optimization program which includes automated exposure control, adjustment of the mA and/or kV according to patient size and/or use of iterative reconstruction technique. COMPARISON:  CT 07/01/2019 FINDINGS: Lower chest: Small LEFT pleural effusion Hepatobiliary: No focal hepatic lesion identified on noncontrast exam gallbladder collapsed. No biliary duct dilatation evident. Pancreas: No pancreatic inflammation identified. Spleen: Normal spleen Adrenals/urinary tract: Probable LEFT adrenal gland metastasis unchanged measuring 2.7 x 4.1 cm. No nephrolithiasis or ureterolithiasis. No bladder calculi. Stomach/Bowel: Mass in the gastric cardiac region and gastrohepatic lumen is less well-defined on noncontrast CT. No small bowel dilatation. Normal colon. Vascular/Lymphatic: Multiple periaortic lymph nodes in the upper abdomen unchanged from prior. Reproductive: Unremarkable Other: Large volume intraperitoneal free fluid unchanged from comparison exam. Peritoneal implant in ventral abdomen not changed at 18 mm (image 30/2) Musculoskeletal: No aggressive osseous lesion. IMPRESSION: 1. No nephrolithiasis ureterolithiasis or obstructive uropathy. 2. No significant interval change from CT 07/01/2023. No IV contrast 3. Large volume gastric mass and gastrohepatic ligament adenopathy unchanged from prior. 4. Peritoneal metastasis and large volume intraperitoneal free fluid not changed. 5. LEFT adrenal gland metastasis unchanged. 6. Small RIGHT pleural effusion. Electronically Signed   By: Genevive Bi M.D.   On:  07/06/2023 08:32   DG Chest Portable 1 View  Result Date: 07/05/2023 CLINICAL DATA:  Weakness abnormal labs from the cancer center. Pt states labs drawn  yesterday at [facility] center and also states she had a liter of fluid PTA from cancer center. Pt states taking ABX for a UTI, pt endorses watery type diarrhea. EXAM: PORTABLE CHEST 1 VIEW COMPARISON:  Chest x-ray 07/01/2022, CT abdomen pelvis 07/01/2023, CT chest 08/01/2022 FINDINGS: Axis right chest wall dialysis catheter with tip overlying the superior cavoatrial junction. The heart and mediastinal contours are unchanged. Aortic calcification. No focal consolidation. No pulmonary edema. Left base atelectasis. Trace to small volume left pleural effusion. No pneumothorax. No acute osseous abnormality. IMPRESSION: *Trace to small volume left pleural effusion. * Aortic Atherosclerosis (ICD10-I70.0). Electronically Signed   By: Tish Frederickson M.D.   On: 07/05/2023 22:19   US Paracentesis  Result Date: 07/05/2023 INDICATION: 62 year old female. History of adenocarcinoma of the GE junction. Found to have ascites. Request is for therapeutic and diagnostic paracentesis EXAM: ULTRASOUND GUIDED THERAPEUTIC AND DIAGNOSTIC PARACENTESIS MEDICATIONS: Lidocaine 1% 10 mL COMPLICATIONS: None immediate. PROCEDURE: Informed written consent was obtained from the patient after a discussion of the risks, benefits and alternatives to treatment. A timeout was performed prior to the initiation of the procedure. Initial ultrasound scanning demonstrates a small amount of ascites within the right lower abdominal quadrant. The right lower abdomen was prepped and draped in the usual sterile fashion. 1% lidocaine was used for local anesthesia. Following this, a 19 gauge, 10-cm, Yueh catheter was introduced. An ultrasound image was saved for documentation purposes. The paracentesis was performed. The catheter was removed and a dressing was applied. The patient tolerated the procedure well  without immediate post procedural complication. FINDINGS: A total of approximately 2.3 L of cloudy yellow fluid was removed. Samples were sent to the laboratory as requested by the clinical team. IMPRESSION: Successful ultrasound-guided therapeutic and diagnostic paracentesis yielding 2.3 liters of peritoneal fluid. Performed by Anders Grant NP Electronically Signed   By: Olive Bass M.D.   On: 07/05/2023 08:42   CT ABDOMEN PELVIS W CONTRAST  Result Date: 07/01/2023 CLINICAL DATA:  Cancer patient being treated with chemotherapy for GE junction neoplasm, presents with loss of appetite and increasing abdominal distension, abdominal pain and worsening constipation. 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 EXAM: CT ABDOMEN AND PELVIS WITH CONTRAST TECHNIQUE: Multidetector CT imaging of the abdomen and pelvis was performed using the standard protocol following bolus administration of intravenous contrast. RADIATION DOSE REDUCTION: This exam was performed according to the departmental dose-optimization program which includes automated exposure control, adjustment of the mA and/or kV according to patient size and/or use of iterative reconstruction technique. CONTRAST:  OMNIPAQUE IOHEXOL 300 MG/ML  SOLN COMPARISON:  There are multiple prior CTs. The 2 most recent do not have available reports as they were performed at a [facility] facility. This includes a CT of the abdomen with contrast 05/18/2023 and CT abdomen and pelvis with IV contrast 01/12/2023. The most recent exam with an attached report is CT chest, abdomen and pelvis CT with contrast 08/02/2023. the patient's last PET-CT was 09/15/2022 and also does not have an attached report. FINDINGS: Lower chest: There is subsegmental atelectasis in both bases. There is a small left  layering pleural effusion which has increased in volume since 05/18/2023. No right pleural effusion. A port catheter again terminates in the upper right atrium. The cardiac size is normal. Compared with 05/18/2023 there is increasing fold thickening in the distal esophagus and proximal  stomach, increased luminal effacement. There is a periesophageal lymph node on 2:17 which is larger than previously now 1.6 x 1 cm, previously 1.1 x 0.4 cm (2:17). Hepatobiliary: There is a new 1.6 cm hypervascular lesion posteriorly in segment 7 on 2:23 concerning for metastasis. Another measuring 8 mm is noted in segment 4A on 2:18 and is also suspicious. The liver is mildly steatotic. No other mass enhancement is seen. There is cholelithiasis with the gallbladder contracted. There is increased intrahepatic and extrahepatic biliary prominence with the common bile duct now 9 mm. Etiology may be abutting gastrohepatic ligament and periportal adenopathy in the area contributing to this. Pancreas: No primary mass or ductal dilatation, but there is gastrohepatic ligament adenopathy just above the pancreatic neck which is inseparable from the pancreas in may potentially infiltrate the pancreas. Spleen: Somewhat heterogeneous enhancement in the medial spleen initially but uniform enhancement on the delayed images, probably due to perfusional differences or bolus timing. The spleen is not enlarged. Adrenals/Urinary Tract: There is a new solid 1.8 x 1.3 cm right adrenal genu mass almost certainly metastatic. The left adrenal gland is not well seen due to adjacent bulky left para-aortic chain adenopathy and could also be tumor-involved. The kidneys concentrate and excrete contrast symmetrically with no mass enhancement. There is increased trace bilateral perinephric fluid. No hydronephrosis, urinary stones or bladder thickening. Stomach/Bowel: As above there is increased masslike fold thickening at the GE junction extending into the proximal  stomach, but there is also increased moderate to severe irregular gastric antral wall thickening, which could be inflammatory or due to additional infiltrating disease. Furthermore the stomach is increasingly fluid distended proximal to this. No small bowel obstruction is seen. An appendix is not seen in this patient. Large volume of retained stool is increased over priors. No bowel wall thickening. Vascular/Lymphatic: Multifocal bulky adenopathy. There is a conglomerate gastrohepatic ligament mass measuring 8.1 x 5.8 cm on 2:32, previously 7.5 x 4.2 cm. This mass completely encases, and narrows the main hepatic artery and splenic artery and encases and severely narrows the proximal hepatic portal vein. There are additional portacaval lymph nodes, largest of these is 2.2 x 1.6 cm on 2:34, slightly larger than previously. There is worsened retroperitoneal lymphadenopathy. Bulky left periaortic chain nodes are noted with the largest of these measuring 3.8 x 2.7 cm on 2:31, on the last CT was 3 x 1.9 cm, with multiple others in this chain abutting and narrowing the left renal vein. Multiple enlarged aortocaval lymph nodes are also noted, for example on 2:39 measuring 2.5 x 1.3 cm, previously 2.1 x 1.2 cm. Some of these lymph nodes partially efface the IVC lumen. No pelvic or bulky mesenteric adenopathy is seen. Reproductive: Status post hysterectomy. No adnexal masses. Other: Since 05/18/2023, large-volume abdominal and pelvic ascites has developed in the abdomen and pelvis with scattered fluid in the mesenteric folds. There is no free air, localizing collection or hemorrhage. There is a new mesenteric lymph node in the transverse mesocolon measuring 2.0 x 1.4 cm on 2:45. Scattered omental varices. No hernias. Increased mild body wall anasarca and mesenteric congestive changes. Musculoskeletal: No regional bone metastasis is seen. IMPRESSION: 1. Increasing masslike fold thickening at the GE junction extending into the  proximal stomach, with increased moderate to severe irregular gastric antral wall thickening, the latter which could be inflammatory or due to additional infiltrating disease. 2. Increasing fluid distended stomach proximal to the antral fold thickening. 3. Increased size of bulky gastrohepatic ligament adenopathy which completely  encases and narrows the main hepatic artery and splenic artery, and encases and severely narrows the proximal hepatic portal vein. 4. Increased size of a pre-esophageal lymph node, and increased size of portacaval and aortocaval lymph nodes. 5. 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 6. Two new hypervascular liver lesions concerning for metastases. 7. New large-volume abdominal and pelvic ascites with increased omental varices. 8. Increased intrahepatic and extrahepatic biliary dilatation, etiology may be abutting gastrohepatic ligament and periportal adenopathy in the area. 9. Increased mild body wall anasarca and mesenteric congestive changes. 10. Increased small left pleural effusion. 11. Constipation. 12. The left adrenal gland is not well seen due to adjacent bulky left para-aortic chain adenopathy and could also be tumor-involved. Electronically Signed   By: Almira Bar M.D.   On: 07/01/2023 21:50   DG Abd 2 Views  Result Date: 06/30/2023 CLINICAL DATA:  Abdominal distension, rule out obstruction. EXAM: ABDOMEN - 2 VIEW COMPARISON:  CT 05/18/2023 FINDINGS: No free intra-abdominal air. No small bowel distention or evidence of obstruction. There is a large volume of stool throughout the colon. Small volume of stool in the rectum. Multiple pelvic phleboliths. Gallstones on prior CT are not well seen. Small left pleural effusion was present on prior abdominal CT. IMPRESSION: Large volume of stool throughout the colon suggesting constipation. No evidence of bowel obstruction. Electronically Signed   By: Narda Rutherford M.D.   On: 06/30/2023 15:04     PERFORMANCE STATUS (ECOG) : 3 - Symptomatic, >50% confined to bed  Review of Systems Unless otherwise noted, a complete review of systems is negative.  Physical Exam General: NAD Cardiovascular: regular rate and rhythm Pulmonary: clear ant fields Abdomen: soft, nontender, + bowel sounds GU: no suprapubic tenderness Extremities: no edema, no joint deformities Skin: no rashes Neurological: Weakness but otherwise nonfocal  IMPRESSION: Patient sent to the hospital with weakness started on IV antibiotics for E. coli UTI.  Recent CTs consistent with disease progression.  Patient found to have transaminitis and hyperbilirubinemia, likely from liver involvement of cancer.  Today, patient says that she is feeling better after recent paracentesis.  She also had paracentesis on 10/22.  Discussed likely recurrence of ascites and may benefit from Tenckhoff catheter.  Discussed overall goals with patient and husband.  They stated they are at peace with the likelihood that she is nearing end-of-life.  They would like to speak with Dr. Smith Robert to discuss treatment options.  However, if patient is felt to be too high risk for additional chemotherapy, patient likely to opt for hospice care.  They have familiarity with hospice from the death of loved ones.  Patient and husband have discussed and she would like her end-of-life to be at a hospice facility if possible.  PLAN: -Continue current scope of treatment -Patient awaiting oncology recommendations -Will follow-up  Case and plan discussed with Dr. Smith Robert  Time Total: 50 minutes  Visit consisted of counseling and education dealing with the complex and emotionally intense issues of symptom management and palliative care in the setting of serious and potentially life-threatening illness.Greater than 50%  of this time was spent counseling and coordinating care related to the above assessment and plan.  Signed by: Laurette Schimke, PhD, NP-C

## 2023-07-06 NOTE — Procedures (Signed)
PROCEDURE SUMMARY:  Successful image-guided paracentesis from the RLQ abdomen.  Yielded 3.45  liters of clear yellow fluid.  No immediate complications.  EBL: zero Patient tolerated well.   Specimen were sent for labs.  Please see imaging section of Epic for full dictation.  Ardith Dark PA-C 07/06/2023 3:43 PM  Patient ID: Ariana Wilson, female   DOB: 07-20-1961, 62 y.o.   MRN: [id number]

## 2023-07-06 NOTE — Assessment & Plan Note (Addendum)
3.45 L drawn off with paracentesis on 10/24.  IV albumin given.

## 2023-07-06 NOTE — H&P (Addendum)
History and Physical    Patient: Ariana Wilson NWG:[id number] DOB: 1961/03/16 DOA: 07/05/2023 DOS: the patient was seen and examined on 07/06/2023 PCP: Danella Penton, MD  Patient coming from: Home  Chief Complaint: weakness   HPI: KERLY WEARS is a 62 y.o. 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 coli from 10/19.  Patient had CT abdomen and pelvis just 5 days prior on 10/19, indicated for abdominal pain and distention and worsening constipation.  The results showed moderate to severe gastric antral wall thickening ascites and new liver lesions as outlined below: IMPRESSION: 1. Increasing masslike fold thickening at the GE junction extending into the proximal stomach, with increased moderate to severe irregular gastric antral wall thickening, the latter which could be inflammatory or due to additional infiltrating disease. 2. Increasing fluid distended stomach proximal to the antral fold thickening. 3. Increased size of bulky gastrohepatic ligament adenopathy which completely encases and narrows the main hepatic artery and splenic artery, and encases and severely narrows the proximal hepatic portal vein. 4. Increased size of a pre-esophageal lymph node, and increased size of portacaval and aortocaval lymph nodes. 5. 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 6. Two new hypervascular liver lesions concerning for metastases. 7. New large-volume abdominal and pelvic ascites with increased omental varices. 8. Increased intrahepatic and extrahepatic biliary dilatation, etiology may be abutting gastrohepatic ligament and periportal adenopathy in the area. 9. Increased mild body wall anasarca and mesenteric congestive changes. 10. Increased small left pleural effusion. 11.  Constipation. 12. The left adrenal gland is not well seen due to adjacent bulky left para-aortic chain adenopathy and could also be tumor-involved.   She underwent ultrasound-guided paracentesis on 10/24 with removal of 2.3 L, results still pending.  ED course and data review: Mildly tachycardic to the low 100s with otherwise normal vitals. Pancytopenia with WBC 1.5, hemoglobin 9.8 and platelets 50 CMP notable for elevated LFTs with AST 218, ALT 285, alk phos 1121 and total bilirubin 4.9 all of which were within normal limits just 2 weeks prior. Chest x-ray showed trace left pleural effusion Urine culture from 10/19 showed E. Coli  Patient treated in the ED with Rocephin Hospitalist consulted for admission.    Past Medical History:  Diagnosis Date   Anemia    Arthritis    Atrial fibrillation (HCC)    B12 deficiency    Cancer (HCC)    Dysrhythmia/history A. Fib    Hyperlipidemia    Hypothyroidism    Menopause    Osteoarthritis    Palpitations    Hx of, 25 years ago   Vertigo    Past Surgical History:  Procedure Laterality Date   BILATERAL SALPINGOOPHORECTOMY  2008   DILATION AND CURETTAGE OF UTERUS  08/24/2006   ESOPHAGOGASTRODUODENOSCOPY (EGD) WITH PROPOFOL N/A 03/01/2022   Procedure: ESOPHAGOGASTRODUODENOSCOPY (EGD) WITH PROPOFOL;  Surgeon: Regis Bill, MD;  Location: [facility] ENDOSCOPY;  Service: Endoscopy;  Laterality: N/A;   ESOPHAGOGASTRODUODENOSCOPY (EGD) WITH PROPOFOL N/A 05/04/2022   Procedure: ESOPHAGOGASTRODUODENOSCOPY (EGD) WITH PROPOFOL;  Surgeon: [name], MD;  Location: [facility] ENDOSCOPY;  Service: Gastroenterology;  Laterality: N/A;   ESOPHAGOGASTRODUODENOSCOPY (EGD) WITH PROPOFOL N/A 06/01/2023   Procedure: ESOPHAGOGASTRODUODENOSCOPY (EGD) WITH PROPOFOL;  Surgeon: [name], MD;  Location: [facility] ENDOSCOPY;  Service: Gastroenterology;  Laterality: N/A;   HEMOSTASIS CONTROL  06/01/2023  Procedure: HEMOSTASIS CONTROL;  Surgeon: [name], MD;  Location:  [facility] ENDOSCOPY;  Service: Gastroenterology;;   incision tendon sheath for trigger finger Right    ring finger   LAPAROSCOPIC SUPRACERVICAL HYSTERECTOMY  2008   Dr. Arvil Chaco; with BSO   NOSE SURGERY  03/2008   PORTA CATH INSERTION N/A 03/21/2022   Procedure: PORTA CATH INSERTION;  Surgeon: Annice Needy, MD;  Location: [facility] INVASIVE CV LAB;  Service: Cardiovascular;  Laterality: N/A;   Social History:  reports that she has never smoked. She has never used smokeless tobacco. She reports that she does not drink alcohol and does not use drugs.  Allergies  Allergen Reactions   Sulfa Antibiotics Hives    Says her tongue swelled a bit   Sulfonamide Derivatives Swelling    Tongue swelling    Family History  Problem Relation Age of Onset   Hyperlipidemia Mother    Hypertension Mother    Stroke Mother    Heart disease Father    Hyperlipidemia Father    Atrial fibrillation Father    Melanoma Father    Arrhythmia Sister        Atrial fibrillation   Heart disease Sister    Atrial fibrillation Sister    Heart attack Brother    Breast cancer Neg Hx     Prior to Admission medications   Medication Sig Start Date End Date Taking? Authorizing Provider  Calcium Carbonate-Vitamin D 600-200 MG-UNIT TABS Take 1 tablet by mouth daily.   Patient not taking: Reported on 06/13/2023    [provider]  cephALEXin (KEFLEX) 500 MG capsule Take 1 capsule (500 mg total) by mouth 3 (three) times daily for 5 days. 07/02/23 07/07/23  Delton Prairie, MD  cetirizine (ZYRTEC) 10 MG tablet Take 10 mg by mouth daily. Patient not taking: Reported on 06/13/2023    [provider]  Cholecalciferol 25 MCG (1000 UT) tablet Take 1,000 Units by mouth daily. Patient not taking: Reported on 06/13/2023    [provider]  Cobalamin Combinations (B-12 PLUS FOLIC ACID PO)     [provider]  cyanocobalamin (VITAMIN B12) 1000 MCG tablet Take 1,000 mcg by mouth daily.    [provider]  dexamethasone (DECADRON) 4 MG tablet Take 2 tablets (8 mg total) by mouth daily. Start the day after chemotherapy for 2 days. Take with food. 05/22/23   Creig Hines, MD  dicyclomine (BENTYL) 20 MG tablet Take 20 mg by mouth 2 (two) times daily.    [provider]  diphenoxylate-atropine (LOMOTIL) 2.5-0.025 MG tablet Take 2 tablets by mouth every 6 (six) hours as needed for diarrhea or loose stools. 06/13/23   Alinda Dooms, NP  etodolac (LODINE) 400 MG tablet Take 400 mg by mouth 2 (two) times daily. 02/22/23 02/22/24  [provider]  famotidine (PEPCID) 40 MG tablet Take 1 tablet by mouth at bedtime. Patient not taking: Reported on 06/13/2023 10/21/22 10/21/23  [provider]  fentaNYL (DURAGESIC) 25 MCG/HR Place 1 patch onto the skin every 3 (three) days. 07/04/23   Alinda Dooms, NP  flecainide (TAMBOCOR) 50 MG tablet Take 1 tablet by mouth 2 (two) times daily. 07/09/20   [provider]  lactulose (CHRONULAC) 10 GM/15ML solution Take 15-30 mLs (10-20 g total) by mouth 3 (three) times daily as needed for mild constipation. 06/30/23   Borders, Daryl Eastern, NP  lidocaine-prilocaine (EMLA) cream Apply to affected area once Patient not taking: Reported on 06/13/2023 05/22/23  Creig Hines, MD  loperamide (IMODIUM) 2 MG capsule Take 2 tabs by mouth with first loose stool, then 1 tab with each additional loose stool as needed. Do not exceed 8 tabs in a 24-hour period Patient not taking: Reported on 05/29/2023 05/22/23   Creig Hines, MD  Melatonin 10 MG TABS Take by mouth. Patient not taking: Reported on 06/20/2023    [provider]  metoprolol tartrate (LOPRESSOR) 25 MG tablet Take 25 mg by mouth 2 (two) times daily as needed.    [provider]  midodrine (PROAMATINE) 10 MG tablet Take by mouth. 05/26/22   [provider]  mometasone (NASONEX) 50 MCG/ACT nasal spray Place 2 sprays into the nose daily.    [provider]   OLANZapine (ZYPREXA) 10 MG tablet Take 1 tablet (10 mg total) by mouth at bedtime. 06/08/23   Borders, Daryl Eastern, NP  ondansetron (ZOFRAN) 8 MG tablet Take 1 tablet (8 mg total) by mouth every 8 (eight) hours as needed for nausea, vomiting or refractory nausea / vomiting. Start on the third day after chemotherapy. 05/22/23   Creig Hines, MD  oxyCODONE (OXY IR/ROXICODONE) 5 MG immediate release tablet Take 1 tablet (5 mg total) by mouth every 4 (four) hours as needed for severe pain. 05/19/23   Creig Hines, MD  pantoprazole (PROTONIX) 40 MG tablet Take 40 mg by mouth daily. Patient not taking: Reported on 06/13/2023    [provider]  polyethylene glycol (MIRALAX / GLYCOLAX) 17 g packet Take by mouth. Patient not taking: Reported on 06/13/2023    [provider]  promethazine (PHENERGAN) 25 MG tablet Take 1 tablet (25 mg total) by mouth every 8 (eight) hours as needed for refractory nausea / vomiting. May cause sleepiness. Patient not taking: Reported on 06/20/2023 06/13/23   Alinda Dooms, NP  psyllium (METAMUCIL) 58.6 % powder Take 1 packet by mouth 3 (three) times daily. Patient not taking: Reported on 06/13/2023    [provider]  Saccharomyces boulardii (PROBIOTIC) 250 MG CAPS Take 1 capsule by mouth daily. Patient not taking: Reported on 06/13/2023    [provider]  simvastatin (ZOCOR) 20 MG tablet Take 1 tablet by mouth at bedtime. Patient not taking: Reported on 06/13/2023 08/10/22   [provider]  sucralfate (CARAFATE) 1 g tablet Take 1 g by mouth 4 (four) times daily. 05/04/23   [provider]  traZODone (DESYREL) 50 MG tablet Take 50 mg by mouth at bedtime as needed. 04/24/23   [provider]  Zinc Citrate-Phytase (ZYTAZE) 25-500 MG CAPS Take 1 Dose by mouth daily. Patient not taking: Reported on 06/13/2023    [provider]    Physical Exam: Vitals:   07/05/23 2200 07/06/23 0016 07/06/23 0030 07/06/23 0121   BP:  120/70 113/68 119/65  Pulse:  (!) 111 (!) 102   Resp:    19  Temp: 97.6 F (36.4 C)   97.9 F (36.6 C)  TempSrc: Axillary     SpO2:  98% 98% 99%  Weight:      Height:       Physical Exam Vitals and nursing note reviewed.  Constitutional:      General: She is not in acute distress.    Comments: Frail-appearing female  HENT:     Head: Normocephalic and atraumatic.  Cardiovascular:     Rate and Rhythm: Regular rhythm. Tachycardia present.     Heart sounds: Normal heart sounds.  Pulmonary:  Effort: Pulmonary effort is normal.     Breath sounds: Normal breath sounds.  Abdominal:     Palpations: Abdomen is soft.     Tenderness: There is abdominal tenderness in the epigastric area.  Neurological:     Mental Status: Mental status is at baseline.     Labs on Admission: I have personally reviewed following labs and imaging studies  CBC: Recent Labs  Lab 07/01/23 1549 07/04/23 1102 07/05/23 2127  WBC 7.7 3.5* 1.5*  NEUTROABS  --  1.6* 0.5*  HGB 9.8* 9.6* 9.8*  HCT 30.1* 29.9* 30.7*  MCV 94.4 94.9 93.3  PLT 154 79* 50*   Basic Metabolic Panel: Recent Labs  Lab 07/01/23 1549 07/04/23 1102 07/05/23 2127  NA 128* 130* 133*  K 4.1 4.5 3.9  CL 97* 99 100  CO2 22 25 24   GLUCOSE 171* 105* 115*  BUN 16 25* 21  CREATININE 0.54 0.86 0.67  CALCIUM 8.3* 8.2* 8.0*   GFR: Estimated Creatinine Clearance: 62.5 mL/min (by C-G formula based on SCr of 0.67 mg/dL). Liver Function Tests: Recent Labs  Lab 07/01/23 1549 07/05/23 2127  AST 292* 218*  ALT 248* 285*  ALKPHOS 976* 1,121*  BILITOT 5.9* 4.9*  PROT 6.4* 6.2*  ALBUMIN 2.8* 2.8*   Recent Labs  Lab 07/01/23 1549  LIPASE 93*   No results for input(s): "AMMONIA" in the last 168 hours. Coagulation Profile: No results for input(s): "INR", "PROTIME" in the last 168 hours. Cardiac Enzymes: No results for input(s): "CKTOTAL", "CKMB", "CKMBINDEX", "TROPONINI" in the last 168 hours. BNP (last 3 results) No  results for input(s): "PROBNP" in the last 8760 hours. HbA1C: No results for input(s): "HGBA1C" in the last 72 hours. CBG: No results for input(s): "GLUCAP" in the last 168 hours. Lipid Profile: No results for input(s): "CHOL", "HDL", "LDLCALC", "TRIG", "CHOLHDL", "LDLDIRECT" in the last 72 hours. Thyroid Function Tests: No results for input(s): "TSH", "T4TOTAL", "FREET4", "T3FREE", "THYROIDAB" in the last 72 hours. Anemia Panel: No results for input(s): "VITAMINB12", "FOLATE", "FERRITIN", "TIBC", "IRON", "RETICCTPCT" in the last 72 hours. Urine analysis:    Component Value Date/Time   COLORURINE AMBER (A) 07/01/2023 2341   APPEARANCEUR HAZY (A) 07/01/2023 2341   LABSPEC 1.035 (H) 07/01/2023 2341   PHURINE 5.0 07/01/2023 2341   GLUCOSEU NEGATIVE 07/01/2023 2341   HGBUR MODERATE (A) 07/01/2023 2341   BILIRUBINUR NEGATIVE 07/01/2023 2341   KETONESUR NEGATIVE 07/01/2023 2341   PROTEINUR NEGATIVE 07/01/2023 2341   NITRITE NEGATIVE 07/01/2023 2341   LEUKOCYTESUR MODERATE (A) 07/01/2023 2341    Radiological Exams on Admission: DG Chest Portable 1 View  Result Date: 07/05/2023 CLINICAL DATA:  Weakness abnormal labs from the cancer center. Pt states labs drawn yesterday at [facility] and also states she had a liter of fluid PTA from cancer center. Pt states taking ABX for a UTI, pt endorses watery type diarrhea. EXAM: PORTABLE CHEST 1 VIEW COMPARISON:  Chest x-ray 07/01/2022, CT abdomen pelvis 07/01/2023, CT chest 08/01/2022 FINDINGS: Axis right chest wall dialysis catheter with tip overlying the superior cavoatrial junction. The heart and mediastinal contours are unchanged. Aortic calcification. No focal consolidation. No pulmonary edema. Left base atelectasis. Trace to small volume left pleural effusion. No pneumothorax. No acute osseous abnormality. IMPRESSION: *Trace to small volume left pleural effusion. * Aortic Atherosclerosis (ICD10-I70.0). Electronically Signed   By: Tish Frederickson  M.D.   On: 07/05/2023 22:19   US Paracentesis  Result Date: 07/05/2023 INDICATION: 62 year old female. History of adenocarcinoma of the GE  junction. Found to have ascites. Request is for therapeutic and diagnostic paracentesis EXAM: ULTRASOUND GUIDED THERAPEUTIC AND DIAGNOSTIC PARACENTESIS MEDICATIONS: Lidocaine 1% 10 mL COMPLICATIONS: None immediate. PROCEDURE: Informed written consent was obtained from the patient after a discussion of the risks, benefits and alternatives to treatment. A timeout was performed prior to the initiation of the procedure. Initial ultrasound scanning demonstrates a small amount of ascites within the right lower abdominal quadrant. The right lower abdomen was prepped and draped in the usual sterile fashion. 1% lidocaine was used for local anesthesia. Following this, a 19 gauge, 10-cm, Yueh catheter was introduced. An ultrasound image was saved for documentation purposes. The paracentesis was performed. The catheter was removed and a dressing was applied. The patient tolerated the procedure well without immediate post procedural complication. FINDINGS: A total of approximately 2.3 L of cloudy yellow fluid was removed. Samples were sent to the laboratory as requested by the clinical team. IMPRESSION: Successful ultrasound-guided therapeutic and diagnostic paracentesis yielding 2.3 liters of peritoneal fluid. Performed by Anders Grant NP Electronically Signed   By: Olive Bass M.D.   On: 07/05/2023 08:42     Data Reviewed: Relevant notes from primary care and specialist visits, past discharge summaries as available in EHR, including Care Everywhere. Prior diagnostic testing as pertinent to current admission diagnoses Updated medications and problem lists for reconciliation ED course, including vitals, labs, imaging, treatment and response to treatment Triage notes, nursing and pharmacy notes and ED provider's notes Notable results as noted in HPI   Assessment and  Plan: * E. coli UTI Culture from 10/19 with E. coli Continue ceftriaxone and follow repeat cultures  Ascites s/p paracentesis 07/06/23 Abnormal LFTs CT abdomen 07/01/2023: New liver lesions/increased intrahepatic and extrahepatic biliary dilatation Elevated LFTs with AST 218, ALT 285, alk phos 1121 and total bilirubin 4.9 all of which were within normal limits just 2 weeks prior. Acute hepatitis panel, acetaminophen level Repeat CT abdomen and pelvis to evaluate for worsening or need for NG tube GI consult in the a.m.  Primary malignant neuroendocrine tumor of esophagus (HCC) CT from 10/19 showing  progression of thickening of GE junction Patient symptomatic for abdominal pain, abdominal bloating Consider oncology and GI consult in the a.m. Will keep n.p.o. Had palliative care consult on 10/23  Chemotherapy-induced pancytopenia [facility]) Patient on chemotherapy Monitor cell lines    Latest Ref Rng & Units 07/05/2023    9:27 PM 07/04/2023   11:02 AM 07/01/2023    3:49 PM  CBC  WBC 4.0 - 10.5 K/uL 1.5  3.5  7.7   Hemoglobin 12.0 - 15.0 g/dL 9.8  9.6  9.8   Hematocrit 36.0 - 46.0 % 30.7  29.9  30.1   Platelets 150 - 400 K/uL 50  79  154      Cancer associated pain Continue fentanyl patch and oxycodone  Generalized weakness Secondary to UTI, pancytopenia, malignancy  Essential hypertension Continue metoprolol  ATRIAL FIBRILLATION Continue metoprolol    DVT prophylaxis: SCD due to thrombocytopenia  Consults: none  Advance Care Planning: DNR  Family Communication: husband at bedside  Disposition Plan: Back to previous home environment  Severity of Illness: The appropriate patient status for this patient is INPATIENT. Inpatient status is judged to be reasonable and necessary in order to provide the required intensity of service to ensure the patient's safety. The patient's presenting symptoms, physical exam findings, and initial radiographic and laboratory data in  the context of their chronic comorbidities is felt to place them at  high risk for further clinical deterioration. Furthermore, it is not anticipated that the patient will be medically stable for discharge from the hospital within 2 midnights of admission.   * I certify that at the point of admission it is my clinical judgment that the patient will require inpatient hospital care spanning beyond 2 midnights from the point of admission due to high intensity of service, high risk for further deterioration and high frequency of surveillance required.*  Author: Andris Baumann, MD 07/06/2023 1:34 AM  For on call review [email or web address].

## 2023-07-06 NOTE — Assessment & Plan Note (Addendum)
Paroxysmal in nature on flecainide.  Added low-dose metoprolol.  Heart rate improved from yesterday.

## 2023-07-07 ENCOUNTER — Inpatient Hospital Stay: Payer: BC Managed Care – PPO

## 2023-07-07 DIAGNOSIS — N390 Urinary tract infection, site not specified: Secondary | ICD-10-CM | POA: Diagnosis not present

## 2023-07-07 DIAGNOSIS — I9589 Other hypotension: Secondary | ICD-10-CM

## 2023-07-07 DIAGNOSIS — R188 Other ascites: Secondary | ICD-10-CM | POA: Diagnosis not present

## 2023-07-07 DIAGNOSIS — G893 Neoplasm related pain (acute) (chronic): Secondary | ICD-10-CM

## 2023-07-07 DIAGNOSIS — C7A8 Other malignant neuroendocrine tumors: Secondary | ICD-10-CM | POA: Diagnosis not present

## 2023-07-07 DIAGNOSIS — R932 Abnormal findings on diagnostic imaging of liver and biliary tract: Secondary | ICD-10-CM | POA: Diagnosis not present

## 2023-07-07 DIAGNOSIS — Z515 Encounter for palliative care: Secondary | ICD-10-CM | POA: Diagnosis not present

## 2023-07-07 LAB — COMPREHENSIVE METABOLIC PANEL
ALT: 182 U/L — ABNORMAL HIGH (ref 0–44)
AST: 134 U/L — ABNORMAL HIGH (ref 15–41)
Albumin: 2.6 g/dL — ABNORMAL LOW (ref 3.5–5.0)
Alkaline Phosphatase: 768 U/L — ABNORMAL HIGH (ref 38–126)
Anion gap: 7 (ref 5–15)
BUN: 24 mg/dL — ABNORMAL HIGH (ref 8–23)
CO2: 24 mmol/L (ref 22–32)
Calcium: 7.8 mg/dL — ABNORMAL LOW (ref 8.9–10.3)
Chloride: 99 mmol/L (ref 98–111)
Creatinine, Ser: 0.5 mg/dL (ref 0.44–1.00)
GFR, Estimated: >60 mL/min (ref >60)
Glucose, Bld: 105 mg/dL — ABNORMAL HIGH (ref 70–99)
Potassium: 4 mmol/L (ref 3.5–5.1)
Sodium: 130 mmol/L — ABNORMAL LOW (ref 135–145)
Total Bilirubin: 4.5 mg/dL — ABNORMAL HIGH (ref 0.3–1.2)
Total Protein: 5.1 g/dL — ABNORMAL LOW (ref 6.5–8.1)

## 2023-07-07 LAB — CBC
HCT: 20.5 % — ABNORMAL LOW (ref 36.0–46.0)
Hemoglobin: 7 g/dL — ABNORMAL LOW (ref 12.0–15.0)
MCH: 30.6 pg (ref 26.0–34.0)
MCHC: 34.1 g/dL (ref 30.0–36.0)
MCV: 89.5 fL (ref 80.0–100.0)
Platelets: 46 10*3/uL — ABNORMAL LOW (ref 150–400)
RBC: 2.29 MIL/uL — ABNORMAL LOW (ref 3.87–5.11)
RDW: 16.2 % — ABNORMAL HIGH (ref 11.5–15.5)
WBC: 1.1 10*3/uL — CL (ref 4.0–10.5)
nRBC: 0 % (ref 0.0–0.2)

## 2023-07-07 LAB — URINALYSIS, COMPLETE (UACMP) WITH MICROSCOPIC
Glucose, UA: NEGATIVE mg/dL
Hgb urine dipstick: NEGATIVE
Ketones, ur: 5 mg/dL — AB
Nitrite: NEGATIVE
Protein, ur: NEGATIVE mg/dL
Specific Gravity, Urine: 1.03 (ref 1.005–1.030)
pH: 5 (ref 5.0–8.0)

## 2023-07-07 LAB — CYTOLOGY - NON PAP

## 2023-07-07 LAB — PREPARE RBC (CROSSMATCH)

## 2023-07-07 MED ORDER — ADULT MULTIVITAMIN W/MINERALS CH
1.0000 | ORAL_TABLET | Freq: Every day | ORAL | Status: DC
  Administered 2023-07-12: 1 via ORAL
  Filled 2023-07-07 (×4): qty 1

## 2023-07-07 MED ORDER — GADOBUTROL 1 MMOL/ML IV SOLN
7.0000 mL | Freq: Once | INTRAVENOUS | Status: AC | PRN
Start: 2023-07-07 — End: 2023-07-07
  Administered 2023-07-07: 7 mL via INTRAVENOUS

## 2023-07-07 MED ORDER — METOPROLOL TARTRATE 25 MG PO TABS
12.5000 mg | ORAL_TABLET | Freq: Two times a day (BID) | ORAL | Status: DC
  Administered 2023-07-07 – 2023-07-11 (×7): 12.5 mg via ORAL
  Filled 2023-07-07 (×8): qty 1

## 2023-07-07 MED ORDER — ENSURE ENLIVE PO LIQD
237.0000 mL | Freq: Three times a day (TID) | ORAL | Status: DC
  Administered 2023-07-07 – 2023-07-08 (×2): 237 mL via ORAL

## 2023-07-07 MED ORDER — SODIUM CHLORIDE 0.9% IV SOLUTION: Freq: Once | INTRAVENOUS | Status: AC

## 2023-07-07 NOTE — Progress Notes (Signed)
Pt sent for procedure via bed in stable condition.

## 2023-07-07 NOTE — Assessment & Plan Note (Signed)
On midodrine 

## 2023-07-07 NOTE — Progress Notes (Signed)
Physical Therapy Treatment Patient Details Name: Ariana Wilson MRN: [id number] DOB: 12/21/1960 Today's Date: 07/07/2023   History of Present Illness Ariana Wilson is a 62 y.o. 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 coli from 10/19.    PT Comments  Patient progressing towards physical therapy goals. Patient receiving blood and agreeable to session within tolerance. Patient able to stand from EOB with supervision and maintain standing to brush teeth at sink. Ambulated 180' with no AD and CGA for safety. No LOB but general unsteadiness noted. Encouraged patient to ambulate with husband or nursing staff to improve endurance and overall strength. Discharge plan remains appropriate.     If plan is discharge home, recommend the following: A little help with walking and/or transfers;Assist for transportation   Can travel by private vehicle        Equipment Recommendations  Other (comment) (TBD)    Recommendations for Other Services       Precautions / Restrictions Precautions Precautions: Fall Restrictions Weight Bearing Restrictions: No     Mobility  Bed Mobility Overal bed mobility: Modified Independent                  Transfers Overall transfer level: Needs assistance Equipment used: None Transfers: Sit to/from Stand Sit to Stand: Supervision                Ambulation/Gait Ambulation/Gait assistance: Contact guard assist Gait Distance (Feet): 180 Feet Assistive device: None Gait Pattern/deviations: Step-through pattern, Decreased stride length Gait velocity: decreased     General Gait Details: CGA for safety. No LOB noted   Stairs             Wheelchair Mobility     Tilt Bed    Modified Rankin (Stroke Patients Only)       Balance Overall balance  assessment: Mild deficits observed, not formally tested Sitting-balance support: Feet supported, No upper extremity supported Sitting balance-Leahy Scale: Normal     Standing balance support: During functional activity Standing balance-Leahy Scale: Good                              Cognition Arousal: Alert Behavior During Therapy: Flat affect Overall Cognitive Status: Within Functional Limits for tasks assessed                                          Exercises      General Comments        Pertinent Vitals/Pain Pain Assessment Pain Assessment: No/denies pain    Home Living                          Prior Function            PT Goals (current goals can now be found in the care plan section) Acute Rehab PT Goals Patient Stated Goal: get home PT Goal Formulation: With patient Time For Goal Achievement: 07/20/23 Potential to Achieve Goals: Good Progress towards PT goals: Progressing toward goals    Frequency    Min 1X/week      PT Plan      Co-evaluation  AM-PAC PT "6 Clicks" Mobility   Outcome Measure  Help needed turning from your back to your side while in a flat bed without using bedrails?: None Help needed moving from lying on your back to sitting on the side of a flat bed without using bedrails?: None Help needed moving to and from a bed to a chair (including a wheelchair)?: A Little Help needed standing up from a chair using your arms (e.g., wheelchair or bedside chair)?: A Little Help needed to walk in hospital room?: A Little Help needed climbing 3-5 steps with a railing? : A Little 6 Click Score: 20    End of Session   Activity Tolerance: Patient limited by fatigue Patient left: in bed;with call bell/phone within reach Nurse Communication: Mobility status PT Visit Diagnosis: Unsteadiness on feet (R26.81);Muscle weakness (generalized) (M62.81)     Time: 1610-9604 PT Time Calculation  (min) (ACUTE ONLY): 19 min  Charges:    $Therapeutic Activity: 8-22 mins PT General Charges $$ ACUTE PT VISIT: 1 Visit                     Maylon Peppers, PT, DPT Physical Therapist - [facility]  [facility]    Knox Cervi A Haydyn Liddell 07/07/2023, 3:22 PM

## 2023-07-07 NOTE — Progress Notes (Signed)
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  Patient has had disease progression and poor tolerance of previous chemotherapy.  Patient presenting with generalized weakness and has a positive urine culture from 10/19. Repeat CT scan shows no significant change from previous CT scan, large volume gastric mass and gastrohepatic ligament adenopathy unchanged from prior, peritoneal metastases and a large volume intraperitoneal free fluid.  Left adrenal gland metastases unchanged, small right pleural effusion.  10/24.  3.45 L drawn off from abdomen with paracentesis. 10/25.  Patient feeling a little bit better.  Hemoglobin drifted down to 7.0.  Blood transfusion ordered.  MRCP also ordered.  Assessment and Plan: * Primary malignant neuroendocrine tumor of esophagus (HCC) CT scan showing progression of tumor.  Patient is a DNR.  Dr. Smith Robert to see patient to discuss plan.  E. coli UTI Continue Rocephin today and ending course after that.  Liver lesions on CT concerning for metastases, 07/01/23 Hypervascular liver lesion seen on previous CT. will get an MRCP for further evaluation with total bilirubin and alkaline phosphatase being high could have obstruction of biliary tree.  Ascites s/p paracentesis 07/06/23 3.45 L drawn off with paracentesis on 10/24.  IV albumin given.  Abnormal LFTs Likely from metastatic disease.  Patient and family would like an MRCP.  Chemotherapy-induced pancytopenia (HCC) Patient with pancytopenia.  Continue to monitor.  Transfuse 1 unit of packed red blood cells today benefits and risks of blood transfusion explained to patient and husband..  Cancer associated pain Continue fentanyl patch and oxycodone  Generalized weakness PT  evaluation  Hypotension On midodrine  ATRIAL FIBRILLATION Paroxysmal in nature on flecainide.  Added low-dose metoprolol.  Heart rate improved from yesterday.        Subjective: Patient feeling a little bit better today.  Had 3.45 L drawn off of abdomen yesterday.  Physical Exam: Vitals:   07/07/23 0435 07/07/23 0726 07/07/23 1040 07/07/23 1106  BP: 115/70 101/62 (!) 98/57 (!) 97/59  Pulse: (!) 114 (!) 113 81 81  Resp: 18 18 18 18   Temp: 98.1 F (36.7 C) 98.6 F (37 C) 98.1 F (36.7 C) 98 F (36.7 C)  TempSrc:  Oral Oral Oral  SpO2: 97% 97% 97% 96%  Weight:      Height:       Physical Exam HENT:     Head: Normocephalic.     Mouth/Throat:     Pharynx: No oropharyngeal exudate.  Eyes:     General: Lids are normal.     Conjunctiva/sclera: Conjunctivae normal.  Cardiovascular:     Rate and Rhythm: Regular rhythm. Tachycardia present.     Heart sounds: Normal heart sounds, S1 normal and S2 normal.  Pulmonary:     Breath sounds: No decreased breath sounds, wheezing, rhonchi or rales.  Abdominal:     Palpations: Abdomen is soft.     Tenderness: There is no abdominal tenderness.  Musculoskeletal:     Right lower leg: No swelling.     Left lower leg: No swelling.  Skin:    General: Skin is warm.     Findings: No rash.  Neurological:     Mental Status: She is alert and oriented to person, place, and time.     Data Reviewed: Sodium 130, potassium 4.0, creatinine  0.5, alkaline phosphatase 768, total bilirubin 4.5, AST 134, ALT 182 White blood cell count 1.1, hemoglobin 7.0, platelet count 46  Family Communication: Spoke with husband at the bedside  Disposition: Status is: Inpatient Remains inpatient appropriate because: Transfusing 1 unit of packed red blood cells today.  Ordered MRCP of the abdomen.  Oncology to come discuss goals of care with patient  Planned Discharge Destination: Home with Home Health    Time spent: 28 minutes  Author: Alford Highland,  MD 07/07/2023 1:38 PM  For on call review [email or web address].

## 2023-07-07 NOTE — Plan of Care (Signed)

## 2023-07-07 NOTE — Progress Notes (Signed)
Palliative Medicine [facility] at [facility] [phone] Fax:([phone]   Name: FATEN DILLING Date: 07/07/2023 MRN: [id number]  DOB: October 25, 1960  Patient Care Team: Danella Penton, MD as PCP - General (Internal Medicine) Benita Gutter, RN as Oncology Nurse Navigator Creig Hines, MD as Consulting Physician (Oncology)    REASON FOR CONSULTATION: DALLYN BRUNN is a 62 y.o. 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  She was diagnosed with cancer in June 2023.  Patient has had disease progression and poor tolerance of previous chemotherapy.  Palliative care is consulted to address goals.  SOCIAL HISTORY:     reports that she has never smoked. She has never used smokeless tobacco. She reports that she does not drink alcohol and does not use drugs.  Patient is married and lives at home with her husband.  She is retired Runner, broadcasting/film/video.   ADVANCE DIRECTIVES:  On file  CODE STATUS: DNR  PAST MEDICAL HISTORY: Past Medical History:  Diagnosis Date   Anemia    Arthritis    Atrial fibrillation (HCC)    B12 deficiency    Cancer (HCC)    Dysrhythmia/history A. Fib    Hyperlipidemia    Hypothyroidism    Menopause    Osteoarthritis    Palpitations    Hx of, 25 years ago   Vertigo     PAST SURGICAL HISTORY:  Past Surgical History:  Procedure Laterality Date   BILATERAL SALPINGOOPHORECTOMY  2008   DILATION AND CURETTAGE OF UTERUS  08/24/2006   ESOPHAGOGASTRODUODENOSCOPY (EGD) WITH PROPOFOL N/A 03/01/2022   Procedure: ESOPHAGOGASTRODUODENOSCOPY (EGD) WITH PROPOFOL;  Surgeon: Regis Bill, MD;  Location: [facility] ENDOSCOPY;  Service: Endoscopy;  Laterality: N/A;   ESOPHAGOGASTRODUODENOSCOPY (EGD) WITH PROPOFOL N/A 05/04/2022   Procedure: ESOPHAGOGASTRODUODENOSCOPY (EGD) WITH PROPOFOL;  Surgeon: [name], MD;  Location: [facility] ENDOSCOPY;  Service: Gastroenterology;   Laterality: N/A;   ESOPHAGOGASTRODUODENOSCOPY (EGD) WITH PROPOFOL N/A 06/01/2023   Procedure: ESOPHAGOGASTRODUODENOSCOPY (EGD) WITH PROPOFOL;  Surgeon: [name], MD;  Location: [facility] ENDOSCOPY;  Service: Gastroenterology;  Laterality: N/A;   HEMOSTASIS CONTROL  06/01/2023   Procedure: HEMOSTASIS CONTROL;  Surgeon: [name], MD;  Location: [facility] ENDOSCOPY;  Service: Gastroenterology;;   incision tendon sheath for trigger finger Right    ring finger   LAPAROSCOPIC SUPRACERVICAL HYSTERECTOMY  2008   Dr. Arvil Chaco; with [facility]   NOSE SURGERY  03/2008   PORTA CATH INSERTION N/A 03/21/2022   Procedure: PORTA CATH INSERTION;  Surgeon: Annice Needy, MD;  Location: [facility] INVASIVE CV LAB;  Service: Cardiovascular;  Laterality: N/A;    HEMATOLOGY/ONCOLOGY HISTORY:  Oncology History  Primary malignant neuroendocrine tumor of esophagus (HCC)  03/20/2022 Initial Diagnosis   Primary malignant neuroendocrine neoplasm of esophagus (HCC)   03/20/2022 Cancer Staging   Staging form: Esophagus - Other Histologies, AJCC 8th Edition - Clinical stage from 03/20/2022: cTX, cN1, cM1, G3 - Signed by Creig Hines, MD on 03/20/2022 Histopathologic type: Large cell neuroendocrine carcinoma Histologic grading system: 3 grade system   03/30/2022 - 04/29/2022 Chemotherapy   Patient is on Treatment Plan : NEUROENDOCRINE GE junctionCisplatin/Tecentriq D1 + Etoposide D1-3 q21d     05/17/2022 - 09/08/2022 Chemotherapy   Patient is on Treatment Plan : GASTROESOPHAGEAL FOLFOX q14d      06/06/2023 -  Chemotherapy   Patient is on Treatment Plan : GE junction adenocarcinoma FOLFIRI q14d     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  MEDICATIONS:  Current  Facility-Administered Medications  Medication Dose Route Frequency Provider Last Rate Last Admin   acetaminophen (TYLENOL) tablet 650 mg  650 mg Oral Q6H PRN Andris Baumann, MD   650 mg at 07/06/23 0214   Or   acetaminophen (TYLENOL) suppository 650 mg  650 mg Rectal Q6H PRN Andris Baumann, MD       cefTRIAXone (ROCEPHIN) 1 g in sodium chloride 0.9 % 100 mL IVPB  1 g Intravenous Q24H Alford Highland, MD 200 mL/hr at 07/06/23 2055 1 g at 07/06/23 2055   Chlorhexidine Gluconate Cloth 2 % PADS 6 each  6 each Topical Daily Alford Highland, MD   6 each at 07/07/23 1025   dicyclomine (BENTYL) tablet 20 mg  20 mg Oral BID Andris Baumann, MD   20 mg at 07/07/23 0817   diphenoxylate-atropine (LOMOTIL) 2.5-0.025 MG per tablet 2 tablet  2 tablet Oral Q6H PRN Andris Baumann, MD       feeding supplement (ENSURE ENLIVE / ENSURE PLUS) liquid 237 mL  237 mL Oral TID BM Alford Highland, MD   237 mL at 07/07/23 1459   fentaNYL (DURAGESIC) 25 MCG/HR 1 patch  1 patch Transdermal Q72H Andris Baumann, MD   1 patch at 07/06/23 1400   flecainide (TAMBOCOR) tablet 50 mg  50 mg Oral Q breakfast Alford Highland, MD   50 mg at 07/07/23 0817   HYDROcodone-acetaminophen (NORCO/VICODIN) 5-325 MG per tablet 1-2 tablet  1-2 tablet Oral Q4H PRN Andris Baumann, MD       lactulose (CHRONULAC) 10 GM/15ML solution 10-20 g  10-20 g Oral TID PRN Andris Baumann, MD       metoprolol tartrate (LOPRESSOR) tablet 12.5 mg  12.5 mg Oral BID Alford Highland, MD   12.5 mg at 07/07/23 0816   midodrine (PROAMATINE) tablet 5 mg  5 mg Oral TID WC Alford Highland, MD   5 mg at 07/07/23 1216   morphine (PF) 2 MG/ML injection 2 mg  2 mg Intravenous Q2H PRN Andris Baumann, MD       multivitamin with minerals tablet 1 tablet  1 tablet Oral Daily Wieting, Richard, MD       OLANZapine (ZYPREXA) tablet 10 mg  10 mg Oral QHS Lindajo Royal V, MD   10 mg at 07/06/23 2045   ondansetron (ZOFRAN) tablet 4 mg  4 mg Oral Q6H PRN Andris Baumann, MD        Or   ondansetron [facility]) injection 4 mg  4 mg Intravenous Q6H PRN Andris Baumann, MD       oxyCODONE (Oxy IR/ROXICODONE) immediate release tablet 5 mg  5 mg Oral Q4H PRN Andris Baumann, MD       traZODone (DESYREL) tablet 50 mg  50 mg Oral QHS PRN Andris Baumann, MD   50 mg at 07/06/23 2045   Facility-Administered Medications Ordered in Other Encounters  Medication Dose Route Frequency Provider Last Rate Last Admin   heparin lock flush 100 UNIT/ML injection             VITAL SIGNS: BP 110/71 (BP Location: Right Arm)  Pulse 86   Temp 98 F (36.7 C) (Oral)   Resp 18   Ht 5' (1.524 m)   Wt 149 lb (67.6 kg)   SpO2 98%   BMI 29.10 kg/m  Filed Weights   07/05/23 1526  Weight: 149 lb (67.6 kg)    Estimated body mass index is 29.1 kg/m as calculated from the following:   Height as of this encounter: 5' (1.524 m).   Weight as of this encounter: 149 lb (67.6 kg).  LABS: CBC:    Component Value Date/Time   WBC 1.1 (LL) 07/07/2023 0629   HGB 7.0 (L) 07/07/2023 0629   HGB 9.6 (L) 07/04/2023 1102   HCT 20.5 (L) 07/07/2023 0629   PLT 46 (L) 07/07/2023 0629   PLT 79 (L) 07/04/2023 1102   MCV 89.5 07/07/2023 0629   NEUTROABS 0.5 (L) 07/05/2023 2127   LYMPHSABS 1.0 07/05/2023 2127   MONOABS 0.0 (L) 07/05/2023 2127   EOSABS 0.0 07/05/2023 2127   BASOSABS 0.0 07/05/2023 2127   Comprehensive Metabolic Panel:    Component Value Date/Time   NA 130 (L) 07/07/2023 0629   K 4.0 07/07/2023 0629   CL 99 07/07/2023 0629   CO2 24 07/07/2023 0629   BUN 24 (H) 07/07/2023 0629   CREATININE 0.50 07/07/2023 0629   CREATININE 0.86 07/04/2023 1102   GLUCOSE 105 (H) 07/07/2023 0629   CALCIUM 7.8 (L) 07/07/2023 0629   AST 134 (H) 07/07/2023 0629   AST 203 (HH) 06/27/2023 1013   ALT 182 (H) 07/07/2023 0629   ALT 150 (H) 06/27/2023 1013   ALKPHOS 768 (H) 07/07/2023 0629   BILITOT 4.5 (H) 07/07/2023 0629   BILITOT 1.1 06/27/2023 1013   PROT 5.1 (L) 07/07/2023 0629   PROT 6.6  03/06/2015 0910   ALBUMIN 2.6 (L) 07/07/2023 0629   ALBUMIN 4.6 03/06/2015 0910    RADIOGRAPHIC STUDIES: US Paracentesis  Result Date: 07/06/2023 INDICATION: Paracentesis for peritoneal metastasis with large volume free fluid. EXAM: ULTRASOUND GUIDED Therapeutic and Diagnostic  PARACENTESIS MEDICATIONS: None. COMPLICATIONS: None immediate. PROCEDURE: Informed written consent was obtained from the patient after a discussion of the risks, benefits and alternatives to treatment. A timeout was performed prior to the initiation of the procedure. Initial ultrasound scanning demonstrates a large amount of ascites within the right lower abdominal quadrant. The right lower abdomen was prepped and draped in the usual sterile fashion. 1% lidocaine was used for local anesthesia. Following this, a 19 gauge, 7-cm, Yueh catheter was introduced. An ultrasound image was saved for documentation purposes. The paracentesis was performed. The catheter was removed and a dressing was applied. The patient tolerated the procedure well without immediate post procedural complication. FINDINGS: A total of approximately 3.45 liters of clear yellow fluid was removed. Samples were sent to the laboratory as requested by the clinical team. IMPRESSION: Successful ultrasound-guided paracentesis yielding 3.45 liters of peritoneal fluid. Performed by Ardith Dark NP-C Electronically Signed   By: Irish Lack M.D.   On: 07/06/2023 16:16   CT ABDOMEN PELVIS WO CONTRAST  Result Date: 07/06/2023 CLINICAL DATA:  Abdominal pain and flank pain. RIGHT lower quadrant pain. Ongoing chemotherapy. Esophageal cancer. * Tracking Code: BO * is EXAM: CT ABDOMEN AND PELVIS WITHOUT CONTRAST TECHNIQUE: Multidetector CT imaging of the abdomen and pelvis was performed following the standard protocol without IV contrast. RADIATION DOSE REDUCTION: This exam was performed according to the departmental dose-optimization program which includes automated  exposure control, adjustment of the mA and/or kV according to patient  size and/or use of iterative reconstruction technique. COMPARISON:  CT 07/01/2019 FINDINGS: Lower chest: Small LEFT pleural effusion Hepatobiliary: No focal hepatic lesion identified on noncontrast exam gallbladder collapsed. No biliary duct dilatation evident. Pancreas: No pancreatic inflammation identified. Spleen: Normal spleen Adrenals/urinary tract: Probable LEFT adrenal gland metastasis unchanged measuring 2.7 x 4.1 cm. No nephrolithiasis or ureterolithiasis. No bladder calculi. Stomach/Bowel: Mass in the gastric cardiac region and gastrohepatic lumen is less well-defined on noncontrast CT. No small bowel dilatation. Normal colon. Vascular/Lymphatic: Multiple periaortic lymph nodes in the upper abdomen unchanged from prior. Reproductive: Unremarkable Other: Large volume intraperitoneal free fluid unchanged from comparison exam. Peritoneal implant in ventral abdomen not changed at 18 mm (image 30/2) Musculoskeletal: No aggressive osseous lesion. IMPRESSION: 1. No nephrolithiasis ureterolithiasis or obstructive uropathy. 2. No significant interval change from CT 07/01/2023. No IV contrast 3. Large volume gastric mass and gastrohepatic ligament adenopathy unchanged from prior. 4. Peritoneal metastasis and large volume intraperitoneal free fluid not changed. 5. LEFT adrenal gland metastasis unchanged. 6. Small RIGHT pleural effusion. Electronically Signed   By: Genevive Bi M.D.   On: 07/06/2023 08:32   DG Chest Portable 1 View  Result Date: 07/05/2023 CLINICAL DATA:  Weakness abnormal labs from the cancer center. Pt states labs drawn yesterday at [facility] and also states she had a liter of fluid PTA from cancer center. Pt states taking ABX for a UTI, pt endorses watery type diarrhea. EXAM: PORTABLE CHEST 1 VIEW COMPARISON:  Chest x-ray 07/01/2022, CT abdomen pelvis 07/01/2023, CT chest 08/01/2022 FINDINGS: Axis right chest wall  dialysis catheter with tip overlying the superior cavoatrial junction. The heart and mediastinal contours are unchanged. Aortic calcification. No focal consolidation. No pulmonary edema. Left base atelectasis. Trace to small volume left pleural effusion. No pneumothorax. No acute osseous abnormality. IMPRESSION: *Trace to small volume left pleural effusion. * Aortic Atherosclerosis (ICD10-I70.0). Electronically Signed   By: Tish Frederickson M.D.   On: 07/05/2023 22:19   US Paracentesis  Result Date: 07/05/2023 INDICATION: 62 year old female. History of adenocarcinoma of the GE junction. Found to have ascites. Request is for therapeutic and diagnostic paracentesis EXAM: ULTRASOUND GUIDED THERAPEUTIC AND DIAGNOSTIC PARACENTESIS MEDICATIONS: Lidocaine 1% 10 mL COMPLICATIONS: None immediate. PROCEDURE: Informed written consent was obtained from the patient after a discussion of the risks, benefits and alternatives to treatment. A timeout was performed prior to the initiation of the procedure. Initial ultrasound scanning demonstrates a small amount of ascites within the right lower abdominal quadrant. The right lower abdomen was prepped and draped in the usual sterile fashion. 1% lidocaine was used for local anesthesia. Following this, a 19 gauge, 10-cm, Yueh catheter was introduced. An ultrasound image was saved for documentation purposes. The paracentesis was performed. The catheter was removed and a dressing was applied. The patient tolerated the procedure well without immediate post procedural complication. FINDINGS: A total of approximately 2.3 L of cloudy yellow fluid was removed. Samples were sent to the laboratory as requested by the clinical team. IMPRESSION: Successful ultrasound-guided therapeutic and diagnostic paracentesis yielding 2.3 liters of peritoneal fluid. Performed by Anders Grant NP Electronically Signed   By: Olive Bass M.D.   On: 07/05/2023 08:42   CT ABDOMEN PELVIS W  CONTRAST  Result Date: 07/01/2023 CLINICAL DATA:  Cancer patient being treated with chemotherapy for GE junction neoplasm, presents with loss of appetite and increasing abdominal distension, abdominal pain and worsening constipation. Most recently the patient underwent biopsy of left periaortic chain adenopathy on 06/02/2023 showing findings consistent with  metastatic adenopathy from the patient's known GE junction cancer, last EGD was 06/01/2023. EXAM: CT ABDOMEN AND PELVIS WITH CONTRAST TECHNIQUE: Multidetector CT imaging of the abdomen and pelvis was performed using the standard protocol following bolus administration of intravenous contrast. RADIATION DOSE REDUCTION: This exam was performed according to the departmental dose-optimization program which includes automated exposure control, adjustment of the mA and/or kV according to patient size and/or use of iterative reconstruction technique. CONTRAST:  OMNIPAQUE IOHEXOL 300 MG/ML  SOLN COMPARISON:  There are multiple prior CTs. The 2 most recent do not have available reports as they were performed at a [facility] facility. This includes a CT of the abdomen with contrast 05/18/2023 and CT abdomen and pelvis with IV contrast 01/12/2023. The most recent exam with an attached report is CT chest, abdomen and pelvis CT with contrast 08/02/2023. the patient's last PET-CT was 09/15/2022 and also does not have an attached report. FINDINGS: Lower chest: There is subsegmental atelectasis in both bases. There is a small left layering pleural effusion which has increased in volume since 05/18/2023. No right pleural effusion. A port catheter again terminates in the upper right atrium. The cardiac size is normal. Compared with 05/18/2023 there is increasing fold thickening in the distal esophagus and proximal stomach, increased luminal effacement. There is a periesophageal lymph node on 2:17 which is larger than previously now 1.6 x 1 cm, previously 1.1 x 0.4 cm (2:17).  Hepatobiliary: There is a new 1.6 cm hypervascular lesion posteriorly in segment 7 on 2:23 concerning for metastasis. Another measuring 8 mm is noted in segment 4A on 2:18 and is also suspicious. The liver is mildly steatotic. No other mass enhancement is seen. There is cholelithiasis with the gallbladder contracted. There is increased intrahepatic and extrahepatic biliary prominence with the common bile duct now 9 mm. Etiology may be abutting gastrohepatic ligament and periportal adenopathy in the area contributing to this. Pancreas: No primary mass or ductal dilatation, but there is gastrohepatic ligament adenopathy just above the pancreatic neck which is inseparable from the pancreas in may potentially infiltrate the pancreas. Spleen: Somewhat heterogeneous enhancement in the medial spleen initially but uniform enhancement on the delayed images, probably due to perfusional differences or bolus timing. The spleen is not enlarged. Adrenals/Urinary Tract: There is a new solid 1.8 x 1.3 cm right adrenal genu mass almost certainly metastatic. The left adrenal gland is not well seen due to adjacent bulky left para-aortic chain adenopathy and could also be tumor-involved. The kidneys concentrate and excrete contrast symmetrically with no mass enhancement. There is increased trace bilateral perinephric fluid. No hydronephrosis, urinary stones or bladder thickening. Stomach/Bowel: As above there is increased masslike fold thickening at the GE junction extending into the proximal stomach, but there is also increased moderate to severe irregular gastric antral wall thickening, which could be inflammatory or due to additional infiltrating disease. Furthermore the stomach is increasingly fluid distended proximal to this. No small bowel obstruction is seen. An appendix is not seen in this patient. Large volume of retained stool is increased over priors. No bowel wall thickening. Vascular/Lymphatic: Multifocal bulky adenopathy.  There is a conglomerate gastrohepatic ligament mass measuring 8.1 x 5.8 cm on 2:32, previously 7.5 x 4.2 cm. This mass completely encases, and narrows the main hepatic artery and splenic artery and encases and severely narrows the proximal hepatic portal vein. There are additional portacaval lymph nodes, largest of these is 2.2 x 1.6 cm on 2:34, slightly larger than previously. There is worsened retroperitoneal  lymphadenopathy. Bulky left periaortic chain nodes are noted with the largest of these measuring 3.8 x 2.7 cm on 2:31, on the last CT was 3 x 1.9 cm, with multiple others in this chain abutting and narrowing the left renal vein. Multiple enlarged aortocaval lymph nodes are also noted, for example on 2:39 measuring 2.5 x 1.3 cm, previously 2.1 x 1.2 cm. Some of these lymph nodes partially efface the IVC lumen. No pelvic or bulky mesenteric adenopathy is seen. Reproductive: Status post hysterectomy. No adnexal masses. Other: Since 05/18/2023, large-volume abdominal and pelvic ascites has developed in the abdomen and pelvis with scattered fluid in the mesenteric folds. There is no free air, localizing collection or hemorrhage. There is a new mesenteric lymph node in the transverse mesocolon measuring 2.0 x 1.4 cm on 2:45. Scattered omental varices. No hernias. Increased mild body wall anasarca and mesenteric congestive changes. Musculoskeletal: No regional bone metastasis is seen. IMPRESSION: 1. Increasing masslike fold thickening at the GE junction extending into the proximal stomach, with increased moderate to severe irregular gastric antral wall thickening, the latter which could be inflammatory or due to additional infiltrating disease. 2. Increasing fluid distended stomach proximal to the antral fold thickening. 3. Increased size of bulky gastrohepatic ligament adenopathy which completely encases and narrows the main hepatic artery and splenic artery, and encases and severely narrows the proximal hepatic  portal vein. 4. Increased size of a pre-esophageal lymph node, and increased size of portacaval and aortocaval lymph nodes. 5. 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 6. Two new hypervascular liver lesions concerning for metastases. 7. New large-volume abdominal and pelvic ascites with increased omental varices. 8. Increased intrahepatic and extrahepatic biliary dilatation, etiology may be abutting gastrohepatic ligament and periportal adenopathy in the area. 9. Increased mild body wall anasarca and mesenteric congestive changes. 10. Increased small left pleural effusion. 11. Constipation. 12. The left adrenal gland is not well seen due to adjacent bulky left para-aortic chain adenopathy and could also be tumor-involved. Electronically Signed   By: Almira Bar M.D.   On: 07/01/2023 21:50   DG Abd 2 Views  Result Date: 06/30/2023 CLINICAL DATA:  Abdominal distension, rule out obstruction. EXAM: ABDOMEN - 2 VIEW COMPARISON:  CT 05/18/2023 FINDINGS: No free intra-abdominal air. No small bowel distention or evidence of obstruction. There is a large volume of stool throughout the colon. Small volume of stool in the rectum. Multiple pelvic phleboliths. Gallstones on prior CT are not well seen. Small left pleural effusion was present on prior abdominal CT. IMPRESSION: Large volume of stool throughout the colon suggesting constipation. No evidence of bowel obstruction. Electronically Signed   By: Narda Rutherford M.D.   On: 06/30/2023 15:04    PERFORMANCE STATUS (ECOG) : 2 - Symptomatic, <50% confined to bed  Review of Systems Unless otherwise noted, a complete review of systems is negative.  Physical Exam General: NAD Cardiovascular: regular rate and rhythm Pulmonary: clear ant fields Abdomen: soft, nontender, + bowel sounds GU: no suprapubic tenderness Extremities: no edema, no joint deformities Skin: no rashes Neurological: Weakness but otherwise  nonfocal  IMPRESSION: Follow-up visit.  Met with patient and husband.  Patient has decided that she is not interested in pursuing further chemotherapy.  With patient and husband are interested in hospice.  They would like to explore possible option of transferring her to a hospice facility with preference of [facility].  If patient is not eligible for hospice IPU, they are  in agreement with taking her home with hospice and considering transitioning her to a hospice facility in the future.  Discussed option of Tenckhoff catheter but patient is not interested at this time.  PLAN: -Best support of care -Referral to hospice  Case and plan discussed with Dr. Smith Robert and Dr. Renae Gloss   Time Total: 30 minutes  Visit consisted of counseling and education dealing with the complex and emotionally intense issues of symptom management and palliative care in the setting of serious and potentially life-threatening illness.Greater than 50%  of this time was spent counseling and coordinating care related to the above assessment and plan.  Signed by: Laurette Schimke, PhD, NP-C

## 2023-07-07 NOTE — Plan of Care (Signed)
  Problem: Health Behavior/Discharge Planning: Goal: Ability to manage health-related needs will improve Outcome: Progressing   Problem: Clinical Measurements: Goal: Ability to maintain clinical measurements within normal limits will improve Outcome: Progressing   Problem: Nutrition: Goal: Adequate nutrition will be maintained Outcome: Progressing   Problem: Pain Management: Goal: General experience of comfort will improve Outcome: Progressing

## 2023-07-07 NOTE — Progress Notes (Signed)
Pt received from procedure via bed in stable condition.

## 2023-07-07 NOTE — Progress Notes (Signed)
Initial Nutrition Assessment  DOCUMENTATION CODES:   Not applicable  INTERVENTION:   -Continue regular diet -Ensure Enlive po TID, each supplement provides 350 kcal and 20 grams of protein -Magic cup TID with meals, each supplement provides 290 kcal and 9 grams of protein   NUTRITION DIAGNOSIS:   Increased nutrient needs related to cancer and cancer related treatments as evidenced by estimated needs.  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 coli from 10/19.  Patient had CT abdomen and pelvis just 5 days prior on 10/19, indicated for abdominal pain and distention and worsening constipation.  Pt admitted with primary malignant neuroendocrine tumor of esophagus.   10/24- s/p RLQ paracentesis (3.45 L removed)  Reviewed I/O's: 0 ml x 24 hours and +240 ml x 24 hours   Pt unavailable at time of visit. Pt down in MRI at time of visit. RD unable to obtain further nutrition-related history or complete nutrition-focused physical exam at this time.    Per MD notes, CT scan revealing progression of tumor and liver lesions on CT concerning for metastasis. Plan for MRCP for further evaluation.   Reviewed wt hx; pt has experienced a 1.9% wt loss over the past month, which is not significant for time frame. Pt also with edema and s/p paracentesis, which may be masking true weight loss as well as fat and muscle depletions.   Palliative care following; plan for oncology to see for further recommendations. If no further treatment available, pt accepting of hospice care and transitioning to hospice house for  comfort care.   Medications reviewed and include bentyl and rocephin.   Labs reviewed: Na: 130, CBGS: 85.    Diet Order:   Diet Order             Diet regular Fluid consistency: Thin  Diet effective now                   EDUCATION NEEDS:   No education needs have been identified at this time  Skin:  Skin Assessment: Reviewed RN Assessment  Last BM:  07/06/23  Height:   Ht Readings from Last 1 Encounters:  07/05/23 5' (1.524 m)    Weight:   Wt Readings from Last 1 Encounters:  07/05/23 67.6 kg    Ideal Body Weight:  45.5 kg  BMI:  Body mass index is 29.1 kg/m.  Estimated Nutritional Needs:   Kcal:  1600-1800  Protein:  80-95 grams  Fluid:  > 1.6 L    Levada Schilling, RD, LDN, CDCES Registered Dietitian III Certified Diabetes Care and Education Specialist Please refer to [facility] for RD and/or RD on-call/weekend/after hours pager

## 2023-07-07 NOTE — Progress Notes (Signed)
Patient is alert and oriented X 4. One unit of RBC transfusion done. No any adverse reaction seen. pt tolerated well.

## 2023-07-08 DIAGNOSIS — R932 Abnormal findings on diagnostic imaging of liver and biliary tract: Secondary | ICD-10-CM | POA: Diagnosis not present

## 2023-07-08 DIAGNOSIS — R7989 Other specified abnormal findings of blood chemistry: Secondary | ICD-10-CM | POA: Diagnosis not present

## 2023-07-08 DIAGNOSIS — R180 Malignant ascites: Secondary | ICD-10-CM | POA: Diagnosis not present

## 2023-07-08 DIAGNOSIS — C7A8 Other malignant neuroendocrine tumors: Secondary | ICD-10-CM | POA: Diagnosis not present

## 2023-07-08 LAB — BASIC METABOLIC PANEL
Anion gap: 8 (ref 5–15)
BUN: 30 mg/dL — ABNORMAL HIGH (ref 8–23)
CO2: 22 mmol/L (ref 22–32)
Calcium: 7.7 mg/dL — ABNORMAL LOW (ref 8.9–10.3)
Chloride: 98 mmol/L (ref 98–111)
Creatinine, Ser: 0.42 mg/dL — ABNORMAL LOW (ref 0.44–1.00)
GFR, Estimated: >60 mL/min (ref >60)
Glucose, Bld: 98 mg/dL (ref 70–99)
Potassium: 4 mmol/L (ref 3.5–5.1)
Sodium: 128 mmol/L — ABNORMAL LOW (ref 135–145)

## 2023-07-08 LAB — BPAM RBC
Blood Product Expiration Date: 202411192359
ISSUE DATE / TIME: 202410251047
Unit Type and Rh: 7300

## 2023-07-08 LAB — TYPE AND SCREEN
ABO/RH(D): B POS
Antibody Screen: NEGATIVE
Unit division: 0

## 2023-07-08 LAB — BODY FLUID CULTURE W GRAM STAIN
Culture: NO GROWTH
Gram Stain: NONE SEEN

## 2023-07-08 LAB — CBC
HCT: 23.9 % — ABNORMAL LOW (ref 36.0–46.0)
Hemoglobin: 8.6 g/dL — ABNORMAL LOW (ref 12.0–15.0)
MCH: 30.6 pg (ref 26.0–34.0)
MCHC: 36 g/dL (ref 30.0–36.0)
MCV: 85.1 fL (ref 80.0–100.0)
Platelets: 74 10*3/uL — ABNORMAL LOW (ref 150–400)
RBC: 2.81 MIL/uL — ABNORMAL LOW (ref 3.87–5.11)
RDW: 16.3 % — ABNORMAL HIGH (ref 11.5–15.5)
WBC: 1.3 10*3/uL — CL (ref 4.0–10.5)
nRBC: 0 % (ref 0.0–0.2)

## 2023-07-08 NOTE — Plan of Care (Signed)
  Problem: Health Behavior/Discharge Planning: Goal: Ability to manage health-related needs will improve Outcome: Progressing   Problem: Nutrition: Goal: Adequate nutrition will be maintained Outcome: Progressing   Problem: Elimination: Goal: Will not experience complications related to bowel motility Outcome: Progressing   Problem: Pain Management: Goal: General experience of comfort will improve Outcome: Progressing   Problem: Health Behavior/Discharge Planning: Goal: Ability to manage health-related needs will improve Outcome: Progressing   Problem: Activity: Goal: Risk for activity intolerance will decrease Outcome: Progressing   Problem: Coping: Goal: Level of anxiety will decrease Outcome: Progressing   Problem: Pain Management: Goal: General experience of comfort will improve Outcome: Progressing   Problem: Safety: Goal: Ability to remain free from injury will improve Outcome: Progressing   Problem: Skin Integrity: Goal: Risk for impaired skin integrity will decrease Outcome: Progressing

## 2023-07-08 NOTE — Progress Notes (Addendum)
Transition of Care [facility]) - Inpatient Brief Assessment   Patient Details  Name: Ariana Wilson MRN: [id number] Date of Birth: February 21, 1961  Transition of Care [facility]) CM/SW Contact:    Bing Quarry, RN Phone Number: 07/08/2023, 10:05 AM   Clinical Narrative: 07/08/23: Patient/family requested referral to [facility]. Contact made with facility via Lanora Manis and information given to her to reach out to spouse/patient today. No beds currently available it patient qualifies for IPU. Also. Per provider, patient/spouse requested to speak to [facility] to see if any IPU beds available if qualifies. [facility] hospice liaison contacted via secure chat.   Update: Spouse and patient has chosen to go with going home with hospice with [facility]. Notified Lanora Manis and she will take care of getting things ready including DME to be delivered, hopefully, by Monday am. Patient does not yet meet IPU criteria but hospice will follow and assist with transition when patient does meet IPU status. RN CM spoke with spouse regarding this and this is their choice at this time. 1pmGabriel Cirri MSN RN CM  Care Management Department.  [place]  [facility] Direct Dial: [phone] Main Office Phone: [phone] Weekends Only      Transition of Care Asessment: Insurance and Status: Insurance coverage has been reviewed Patient has primary care physician: Yes Home environment has been reviewed: Lives at home with Spouse PTA Prior level of function:: At home with spouse. Prior/Current Home Services:  (Being referred to IPU at choice of [facility] Social Determinants of Health Reivew: SDOH reviewed no interventions necessary Readmission risk has been reviewed: Yes Transition of care needs: transition of care needs identified, TOC will continue to follow

## 2023-07-08 NOTE — Progress Notes (Signed)
   07/08/23 2040  Assess: MEWS Score  Temp 98.6 F (37 C)  BP (!) 96/50  MAP (mmHg) (!) 64  Pulse Rate (!) 102  Resp 20  Level of Consciousness Alert  SpO2 96 %  O2 Device Room Air  Assess: MEWS Score  MEWS Temp 0  MEWS Systolic 1  MEWS Pulse 1  MEWS RR 0  MEWS LOC 0  MEWS Score 2  MEWS Score Color Yellow  Assess: if the MEWS score is Yellow or Red  Were vital signs accurate and taken at a resting state? Yes  Does the patient meet 2 or more of the SIRS criteria? Yes  Does the patient have a confirmed or suspected source of infection? Yes  MEWS guidelines implemented  Yes, yellow  Treat  MEWS Interventions Considered administering scheduled or prn medications/treatments as ordered  Take Vital Signs  Increase Vital Sign Frequency  Yellow: Q2hr x1, continue Q4hrs until patient remains green for 12hrs  Escalate  MEWS: Escalate Yellow: Discuss with charge nurse and consider notifying provider and/or RRT  Notify: Charge Nurse/RN  Name of Charge Nurse/RN Notified Donna,RN  Assess: SIRS CRITERIA  SIRS Temperature  0  SIRS Pulse 1  SIRS Respirations  0  SIRS WBC 0  SIRS Score Sum  1   Patient had yellow MEWS, today, now new v/s or symptoms that warrant contacting the provider

## 2023-07-08 NOTE — Plan of Care (Signed)

## 2023-07-08 NOTE — Progress Notes (Signed)
Mobility Specialist - Progress Note   07/08/23 0928  Mobility  Activity Ambulated with assistance in room;Stood at bedside;Dangled on edge of bed  Level of Assistance Standby assist, set-up cues, supervision of patient - no hands on  Assistive Device Front wheel walker  Distance Ambulated (ft) 10 ft  Activity Response Tolerated well  Mobility Referral Yes  $Mobility charge 1 Mobility  Mobility Specialist Start Time (ACUTE ONLY) [id number]  Mobility Specialist Stop Time (ACUTE ONLY) 0851  Mobility Specialist Time Calculation (min) (ACUTE ONLY) 13 min   Pt supine in bed on RA upon arrival. Pt completes bed mobility, STS, and ambulates to/from bathroom door SBA with no physical assistance needed. Pt returns to bed with needs in reach and husband at bedside.   Terrilyn Saver  Mobility Specialist  07/08/23 9:29 AM

## 2023-07-08 NOTE — Plan of Care (Signed)
  Problem: Elimination: Goal: Will not experience complications related to bowel motility Outcome: Progressing   Problem: Pain Management: Goal: General experience of comfort will improve Outcome: Progressing   Problem: Clinical Measurements: Goal: Ability to maintain clinical measurements within normal limits will improve Outcome: Progressing   Problem: Activity: Goal: Risk for activity intolerance will decrease Outcome: Progressing   Problem: Coping: Goal: Level of anxiety will decrease Outcome: Progressing   Problem: Pain Management: Goal: General experience of comfort will improve Outcome: Progressing   Problem: Safety: Goal: Ability to remain free from injury will improve Outcome: Progressing   Problem: Skin Integrity: Goal: Risk for impaired skin integrity will decrease Outcome: Progressing

## 2023-07-08 NOTE — Progress Notes (Signed)
Progress Note   Patient: Ariana Wilson QMV:[id number] DOB: [birth year] DOA: 07/05/2023     3 DOS: the patient was seen and examined on 07/08/2023   Brief hospital course: 62 year old Ariana past medical history significant for stage IV neuroendocrine tumor of the GE junction.  Patient has had disease progression and poor tolerance of previous chemotherapy.  Patient presenting with generalized weakness and has a positive urine culture from 10/19. Repeat CT scan shows no significant change from previous CT scan, large volume gastric mass and gastrohepatic ligament adenopathy unchanged from prior, peritoneal metastases and a large volume intraperitoneal free fluid.  Left adrenal gland metastases unchanged, small right pleural effusion.  10/24.  3.45 L drawn off from abdomen with paracentesis. 10/25.  Patient feeling a little bit better.  Hemoglobin drifted down to 7.0.  Blood transfusion ordered.  MRCP showing obstruction of common bile duct and extensive cancer. 10/26.  Hemoglobin up to 8.6.  Patient interested in hospice facility.  Assessment and Plan: * Primary malignant neuroendocrine tumor of esophagus (HCC) CT scan and MRCP showing progression of tumor.  Patient is a DNR.  Patient is interested in hospice facility.  TOC to look into options.  Liver lesions on CT concerning for metastases, 07/01/23 MRCP showing tumor causing obstruction of common bile duct.  Ascites s/p paracentesis 07/06/23 3.45 L drawn off with paracentesis on 10/24.  IV albumin given.  Abnormal LFTs MRCP showing obstructing common bile duct secondary to tumor.  I discussed the possibility of ERCP and stent.  Patient decided not to undergo any procedure at this time.  Patient not having any itching at this time.  Chemotherapy-induced pancytopenia (HCC) Patient with pancytopenia.  Hemoglobin up to 8.6 after transfusion.  Platelet count up to 74.  White count up to 1.3.  Cancer associated pain Continue fentanyl patch  and oxycodone  Generalized weakness Please PT recommendation is home with home health.  E. coli UTI Completed antibiotic  Hypotension On midodrine  ATRIAL FIBRILLATION Paroxysmal in nature on flecainide and low-dose metoprolol.         Subjective: Patient feeling okay.  Still eating very little.  Admitted with weakness  Physical Exam: Vitals:   07/08/23 0545 07/08/23 0816 07/08/23 1023 07/08/23 1200  BP: 102/64 95/66 (!) 91/59 100/70  Pulse: (!) 108 (!) 102 94 84  Resp: 18 16 18 18   Temp: 98.3 F (36.8 C) 97.9 F (36.6 C) 97.6 F (36.4 C) 98 F (36.7 C)  TempSrc:  Oral Oral Oral  SpO2: 94% 98% 98% 98%  Weight:      Height:       Physical Exam HENT:     Head: Normocephalic.     Mouth/Throat:     Pharynx: No oropharyngeal exudate.  Eyes:     General: Lids are normal.     Comments: Conjunctiva icteric  Cardiovascular:     Rate and Rhythm: Regular rhythm. Tachycardia present.     Heart sounds: Normal heart sounds, S1 normal and S2 normal.  Pulmonary:     Breath sounds: No decreased breath sounds, wheezing, rhonchi or rales.  Abdominal:     Palpations: Abdomen is soft.     Tenderness: There is no abdominal tenderness.  Musculoskeletal:     Right lower leg: No swelling.     Left lower leg: No swelling.  Skin:    General: Skin is warm.     Findings: No rash.  Neurological:     Mental Status: She is alert and oriented to  person, place, and time.     Data Reviewed: Plan # count 1.3, hemoglobin 8.6, platelet count 74, creatinine 0.42, sodium 128  Family Communication: Spoke with husband at the bedside and again in the hallway.  Disposition: Status is: Inpatient Remains inpatient appropriate because: Patient interested in hospice facility.  Planned Discharge Destination: Will see if she is excepted to a hospice facility versus home with hospice.    Time spent: 28 minutes  Author: Alford Highland, MD 07/08/2023 4:24 PM  For on call review  [email or web address].

## 2023-07-08 NOTE — TOC Progression Note (Signed)
Transition of Care [facility]) - Progression Note    Patient Details  Name: Ariana Wilson MRN: [id number] Date of Birth: [birth year]  Transition of Care [facility]) CM/SW Contact  Bing Quarry, RN Phone Number: 07/08/2023, 9:51 AM  Clinical Narrative: 07/08/23: Provider inquired about hospice in [place], [place].  Palliative notes indicated patient/spouse requested [facility]. Reached out to facility and spoke to [facility] -[phone], she took patient information and will reach out to patient/spouse today, but there are no beds available or anticipated over the weekend at this point. Updated provider. TOC to follow through final disposition especially if not approved for IPU.   Gabriel Cirri MSN RN CM  Care Management Department.  [place]  [facility] Direct Dial: [phone] Main Office Phone: [phone] Weekends Only          Expected Discharge Plan and Services                                               Social Determinants of Health (SDOH) Interventions SDOH Screenings   Food Insecurity: No Food Insecurity (07/06/2023)  Housing: Low Risk  (07/06/2023)  Transportation Needs: No Transportation Needs (07/06/2023)  Utilities: Not At Risk (07/06/2023)  Alcohol Screen: Low Risk  (04/20/2022)  Depression (PHQ2-9): Low Risk  (04/20/2022)  Financial Resource Strain: Low Risk  (04/18/2023)   Received from [facility]  Physical Activity: Inactive (04/20/2022)  Social Connections: Socially Integrated (04/20/2022)  Stress: No Stress Concern Present (04/20/2022)  Tobacco Use: Low Risk  (07/05/2023)    Readmission Risk Interventions     No data to display

## 2023-07-09 DIAGNOSIS — R180 Malignant ascites: Secondary | ICD-10-CM | POA: Diagnosis not present

## 2023-07-09 DIAGNOSIS — I480 Paroxysmal atrial fibrillation: Secondary | ICD-10-CM | POA: Diagnosis not present

## 2023-07-09 DIAGNOSIS — E871 Hypo-osmolality and hyponatremia: Secondary | ICD-10-CM

## 2023-07-09 DIAGNOSIS — C7A8 Other malignant neuroendocrine tumors: Secondary | ICD-10-CM | POA: Diagnosis not present

## 2023-07-09 DIAGNOSIS — I9589 Other hypotension: Secondary | ICD-10-CM | POA: Diagnosis not present

## 2023-07-09 NOTE — Consult Note (Signed)
Hematology/Oncology Consult note [facility] [phone] [phone]  Patient Care Team: Danella Penton, MD as PCP - General (Internal Medicine) Benita Gutter, RN as Oncology Nurse Navigator Creig Hines, MD as Consulting Physician (Oncology)   Name of the patient: Ariana Wilson  [id number]  Apr 27, 1961    Reason for consult: Metastatic adenocarcinoma of the GE junction   Requesting physician: Dr.Wieting  Date of visit: 07/07/2023   History of presenting illness-patient is a 62 year old female with history of stage IV GE junction adenocarcinoma who was initially treated with palliative FOLFOX chemotherapy.  She had excellent response to treatment and was given chemotherapy break for nearly 1 year.  She was noted to have worsening metastatic disease inSeptember 2024 as evidenced by multistation adenopathy in the upper abdomen as well as retroperitoneum along with new left adrenal metastases.  She was started on FOLFIRI chemotherapy and has received 2 cycles so far.  She has tolerated chemotherapy poorly as evidenced by worsening quality of life nausea vomiting diarrhea and poor oral intake.  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  Second cycle of chemotherapy was given on 06/27/2023.  She presented to the hospital with failure to thrive and generalized weakness.  She was noted to have worsening LFTs.  On the day of chemotherapy on 06/27/2023 she was noted to have elevated AST and ALT of 203 and 150 respectively with an alkaline phosphatase of 626 with a normal bilirubin of 1.1.  On admission to the hospital she was noted to have further elevation of both AST ALT and alkaline phosphatase as well as increased total bilirubin of 4.9.  CT abdomen and pelvis with contrast on 07/01/2023 showed intrahepatic and extrahepatic biliary prominence with a CBD of 9 mm which was not noted on  subsequent CT abdomen pelvis without contrast  ECOG PS- 3  Pain scale- 4   Review of systems- Review of Systems  Constitutional:  Positive for malaise/fatigue and weight loss. Negative for chills and fever.  HENT:  Negative for congestion, ear discharge and nosebleeds.   Eyes:  Negative for blurred vision.  Respiratory:  Negative for cough, hemoptysis, sputum production, shortness of breath and wheezing.   Cardiovascular:  Negative for chest pain, palpitations, orthopnea and claudication.  Gastrointestinal:  Positive for abdominal pain. Negative for blood in stool, constipation, diarrhea, heartburn, melena, nausea and vomiting.  Genitourinary:  Negative for dysuria, flank pain, frequency, hematuria and urgency.  Musculoskeletal:  Negative for back pain, joint pain and myalgias.  Skin:  Negative for rash.  Neurological:  Negative for dizziness, tingling, focal weakness, seizures, weakness and headaches.  Endo/Heme/Allergies:  Does not bruise/bleed easily.  Psychiatric/Behavioral:  Negative for depression and suicidal ideas. The patient does not have insomnia.     Allergies  Allergen Reactions   Sulfa Antibiotics Hives    Says her tongue swelled a bit   Sulfonamide Derivatives Swelling    Tongue swelling    Patient Active Problem List   Diagnosis Date Noted   Hyponatremia 07/09/2023   Abnormal LFTs 07/06/2023   Ascites s/p paracentesis 07/06/23 07/06/2023   Liver lesions on CT concerning for metastases, 07/01/23 07/06/2023   Palliative care encounter 07/06/2023   E. coli UTI 07/05/2023   Generalized weakness 07/05/2023   Chemotherapy induced nausea and vomiting 06/14/2023   Chemotherapy induced diarrhea 06/14/2023   Cancer associated pain 06/14/2023   Hypothyroidism, adult 10/13/2022   GI bleeding  05/03/2022   Hypovolemic shock (HCC) 05/03/2022   Hypotension 04/14/2022   Iron deficiency anemia 03/31/2022   Malignant tumor of lower third of esophagus (HCC) 03/24/2022    Primary malignant neuroendocrine tumor of esophagus (HCC) 03/20/2022   Bursitis of hip 07/15/2020   History of 2019 novel coronavirus disease (COVID-19) 06/15/2020   Chronic vaginitis 01/06/2020   Acute cystitis 12/07/2018   Vertigo 12/07/2018   Developmental venous anomaly 12/06/2018   Encounter for anticoagulation discussion and counseling 01/29/2018   B12 deficiency 07/04/2017   Tubular adenoma 07/04/2017   Morbid obesity (HCC) 10/04/2016   Primary osteoarthritis of hand 03/08/2016   Family history of coronary artery disease 02/29/2016   Chemotherapy-induced pancytopenia (HCC)    Menopause    Hyperlipemia 03/07/2011   ATRIAL FIBRILLATION 02/14/2009   Palpitations 02/14/2009     Past Medical History:  Diagnosis Date   Anemia    Arthritis    Atrial fibrillation (HCC)    B12 deficiency    Cancer (HCC)    Dysrhythmia/history A. Fib    Hyperlipidemia    Hypothyroidism    Menopause    Osteoarthritis    Palpitations    Hx of, 25 years ago   Vertigo      Past Surgical History:  Procedure Laterality Date   BILATERAL SALPINGOOPHORECTOMY  2008   DILATION AND CURETTAGE OF UTERUS  08/24/2006   ESOPHAGOGASTRODUODENOSCOPY (EGD) WITH PROPOFOL N/A 03/01/2022   Procedure: ESOPHAGOGASTRODUODENOSCOPY (EGD) WITH PROPOFOL;  Surgeon: Regis Bill, MD;  Location: [facility] ENDOSCOPY;  Service: Endoscopy;  Laterality: N/A;   ESOPHAGOGASTRODUODENOSCOPY (EGD) WITH PROPOFOL N/A 05/04/2022   Procedure: ESOPHAGOGASTRODUODENOSCOPY (EGD) WITH PROPOFOL;  Surgeon: [name], MD;  Location: [facility] ENDOSCOPY;  Service: Gastroenterology;  Laterality: N/A;   ESOPHAGOGASTRODUODENOSCOPY (EGD) WITH PROPOFOL N/A 06/01/2023   Procedure: ESOPHAGOGASTRODUODENOSCOPY (EGD) WITH PROPOFOL;  Surgeon: [name], MD;  Location: [facility] ENDOSCOPY;  Service: Gastroenterology;  Laterality: N/A;   HEMOSTASIS CONTROL  06/01/2023   Procedure: HEMOSTASIS CONTROL;  Surgeon: [name], MD;  Location: [facility] ENDOSCOPY;   Service: Gastroenterology;;   incision tendon sheath for trigger finger Right    ring finger   LAPAROSCOPIC SUPRACERVICAL HYSTERECTOMY  2008   Dr. Arvil Chaco; with BSO   NOSE SURGERY  03/2008   PORTA CATH INSERTION N/A 03/21/2022   Procedure: PORTA CATH INSERTION;  Surgeon: Annice Needy, MD;  Location: [facility] INVASIVE CV LAB;  Service: Cardiovascular;  Laterality: N/A;    Social History   Socioeconomic History   Marital status: Married    Spouse name: Not on file   Number of children: Not on file   Years of education: Not on file   Highest education level: Not on file  Occupational History   Occupation: Full time  Tobacco Use   Smoking status: Never   Smokeless tobacco: Never  Vaping Use   Vaping status: Never Used  Substance and Sexual Activity   Alcohol use: Never   Drug use: Never   Sexual activity: Not Currently    Birth control/protection: Surgical    Comment: Hysterectomy  Other Topics Concern   Not on file  Social History Narrative   Married with 2 children   Gets regular exercise   Social Determinants of Health   Financial Resource Strain: Low Risk  (04/18/2023)   Received from [facility]   Overall Financial Resource Strain (CARDIA)    Difficulty of Paying Living Expenses: Not hard at all  Food Insecurity: No Food Insecurity (07/06/2023)   Hunger  Vital Sign    Worried About Programme researcher, broadcasting/film/video in the Last Year: Never true    Ran Out of Food in the Last Year: Never true  Transportation Needs: No Transportation Needs (07/06/2023)   PRAPARE - Administrator, Civil Service (Medical): No    Lack of Transportation (Non-Medical): No  Physical Activity: Inactive (04/20/2022)   Exercise Vital Sign    Days of Exercise per Week: 0 days    Minutes of Exercise per Session: 0 min  Stress: No Stress Concern Present (04/20/2022)   [employer] of Occupational Health - Occupational Stress Questionnaire    Feeling of Stress : Only a little   Social Connections: Socially Integrated (04/20/2022)   Social Connection and Isolation Panel [NHANES]    Frequency of Communication with Friends and Family: More than three times a week    Frequency of Social Gatherings with Friends and Family: More than three times a week    Attends Religious Services: More than 4 times per year    Active Member of [employer] or Organizations: Yes    Attends Engineer, structural: More than 4 times per year    Marital Status: Married  Catering manager Violence: Not At Risk (07/06/2023)   Humiliation, Afraid, Rape, and Kick questionnaire    Fear of Current or Ex-Partner: No    Emotionally Abused: No    Physically Abused: No    Sexually Abused: No     Family History  Problem Relation Age of Onset   Hyperlipidemia Mother    Hypertension Mother    Stroke Mother    Heart disease Father    Hyperlipidemia Father    Atrial fibrillation Father    Melanoma Father    Arrhythmia Sister        Atrial fibrillation   Heart disease Sister    Atrial fibrillation Sister    Heart attack Brother    Breast cancer Neg Hx      Current Facility-Administered Medications:    acetaminophen (TYLENOL) tablet 650 mg, 650 mg, Oral, Q6H PRN, 650 mg at 07/06/23 0214 **OR** acetaminophen (TYLENOL) suppository 650 mg, 650 mg, Rectal, Q6H PRN, Andris Baumann, MD   Chlorhexidine Gluconate Cloth 2 % PADS 6 each, 6 each, Topical, Daily, Renae Gloss, Richard, MD, 6 each at 07/09/23 0930   dicyclomine (BENTYL) tablet 20 mg, 20 mg, Oral, BID, Lindajo Royal V, MD, 20 mg at 07/09/23 0810   diphenoxylate-atropine (LOMOTIL) 2.5-0.025 MG per tablet 2 tablet, 2 tablet, Oral, Q6H PRN, Andris Baumann, MD   feeding supplement (ENSURE ENLIVE / ENSURE PLUS) liquid 237 mL, 237 mL, Oral, TID BM, Wieting, Richard, MD, 237 mL at 07/08/23 0838   fentaNYL (DURAGESIC) 25 MCG/HR 1 patch, 1 patch, Transdermal, Q72H, Andris Baumann, MD, 1 patch at 07/09/23 1442   flecainide (TAMBOCOR) tablet 50 mg, 50  mg, Oral, Q breakfast, Wieting, Richard, MD, 50 mg at 07/09/23 0810   HYDROcodone-acetaminophen (NORCO/VICODIN) 5-325 MG per tablet 1-2 tablet, 1-2 tablet, Oral, Q4H PRN, Andris Baumann, MD   lactulose (CHRONULAC) 10 GM/15ML solution 10-20 g, 10-20 g, Oral, TID PRN, Andris Baumann, MD   metoprolol tartrate (LOPRESSOR) tablet 12.5 mg, 12.5 mg, Oral, BID, Wieting, Richard, MD, 12.5 mg at 07/09/23 0810   midodrine (PROAMATINE) tablet 5 mg, 5 mg, Oral, TID WC, Wieting, Richard, MD, 5 mg at 07/09/23 1700   morphine (PF) 2 MG/ML injection 2 mg, 2 mg, Intravenous, Q2H PRN, Andris Baumann, MD  multivitamin with minerals tablet 1 tablet, 1 tablet, Oral, Daily, Wieting, Richard, MD   OLANZapine [facility]) tablet 10 mg, 10 mg, Oral, QHS, Lindajo Royal V, MD, 10 mg at 07/08/23 2043   ondansetron (ZOFRAN) tablet 4 mg, 4 mg, Oral, Q6H PRN **OR** ondansetron (ZOFRAN) injection 4 mg, 4 mg, Intravenous, Q6H PRN, Lindajo Royal V, MD, 4 mg at 07/09/23 2008   oxyCODONE (Oxy IR/ROXICODONE) immediate release tablet 5 mg, 5 mg, Oral, Q4H PRN, Andris Baumann, MD   traZODone (DESYREL) tablet 50 mg, 50 mg, Oral, QHS PRN, Andris Baumann, MD, 50 mg at 07/08/23 2043  Facility-Administered Medications Ordered in Other Encounters:    heparin lock flush 100 UNIT/ML injection, , , ,    Physical exam:  Vitals:   07/09/23 1225 07/09/23 1234 07/09/23 1549 07/09/23 2016  BP: (!) 89/60 90/60 (!) 100/55 (!) 90/52  Pulse: 88  96 (!) 115  Resp: 17  16 16   Temp: (!) 97.4 F (36.3 C)  97.6 F (36.4 C) 98.1 F (36.7 C)  TempSrc:    Oral  SpO2: 100%  99% 95%  Weight:      Height:       Physical Exam Eyes:     General: Scleral icterus present.  Cardiovascular:     Rate and Rhythm: Normal rate and regular rhythm.     Heart sounds: Normal heart sounds.  Pulmonary:     Effort: Pulmonary effort is normal.     Breath sounds: Normal breath sounds.  Abdominal:     General: Bowel sounds are normal.     Palpations: Abdomen  is soft.     Comments: distended  Skin:    General: Skin is warm and dry.  Neurological:     Mental Status: She is alert and oriented to person, place, and time.           Latest Ref Rng & Units 07/08/2023    5:45 AM  CMP  Glucose 70 - 99 mg/dL 98   BUN 8 - 23 mg/dL 30   Creatinine 1.61 - 1.00 mg/dL 0.96   Sodium 045 - 409 mmol/L 128   Potassium 3.5 - 5.1 mmol/L 4.0   Chloride 98 - 111 mmol/L 98   CO2 22 - 32 mmol/L 22   Calcium 8.9 - 10.3 mg/dL 7.7       Latest Ref Rng & Units 07/08/2023    5:45 AM  CBC  WBC 4.0 - 10.5 K/uL 1.3   Hemoglobin 12.0 - 15.0 g/dL 8.6   Hematocrit 81.1 - 46.0 % 23.9   Platelets 150 - 400 K/uL 74     @IMAGES @  MR ABDOMEN MRCP W WO CONTAST  Result Date: 07/07/2023 CLINICAL DATA:  Five days of abdominal pain and distention, worsening constipation, history of metastatic esophageal cancer EXAM: MRI ABDOMEN WITHOUT AND WITH CONTRAST (INCLUDING MRCP) TECHNIQUE: Multiplanar multisequence MR imaging of the abdomen was performed both before and after the administration of intravenous contrast. Heavily T2-weighted images of the biliary and pancreatic ducts were obtained, and three-dimensional MRCP images were rendered by post processing. CONTRAST:  7mL GADAVIST GADOBUTROL 1 MMOL/ML IV SOLN COMPARISON:  CT abdomen pelvis, 07/06/2023, 07/01/2023 FINDINGS: Lower chest: No acute abnormality. Hepatobiliary: T2 hyperintense, diffusion restricting, slightly hyperenhancing liver lesions of the posterior right lobe of the liver, largest in hepatic segment VII measuring 1.4 x 0.8 cm (series 4, image 15), additional lesion superiorly in hepatic segment VII measuring 0.5 cm (series 4, image 14). Gallstones. No  gallbladder wall thickening. Intra and extrahepatic biliary ductal dilatation, with abrupt obstruction of the common bile duct at the margin of the pancreatic head (series 3, image 17). Common bile or common hepatic duct measures up to 1.0 cm in caliber. Pancreas:  There is again bulky gastrohepatic ligament lymphadenopathy and soft tissue which likely infiltrates the adjacent superior pancreatic head and neck (series 9, image 19). No pancreatic ductal dilatation or surrounding inflammatory changes. Spleen: Normal in size without significant abnormality. Adrenals/Urinary Tract: Unchanged enhancing left adrenal mass measuring 4.5 x 2.8 cm (series 23, image 42). Kidneys are normal, without renal calculi, solid lesion, or hydronephrosis. Stomach/Bowel: Unchanged infiltrative gastroesophageal junction and gastric fundus mass (series 23, image 31). No evidence of other bowel wall thickening, distention, or inflammatory changes. Vascular/Lymphatic: The portal confluence is sharply narrowed and possibly partially occluded by mass effect of adjacent lymphadenopathy (series 23, image 46). Mesenteric varices about the ventral abdominal wall (series 23, image 65). Unchanged bulky, matted retroperitoneal retrocrural, and porta hepatis lymphadenopathy (series 23, image 49). Other: No abdominal wall hernia or abnormality. Large volume ascites throughout the abdomen and pelvis, similar to prior examination. Diffuse peritoneal thickening and enhancement. Musculoskeletal: No acute or significant osseous findings. IMPRESSION: 1. Unchanged infiltrative gastroesophageal junction and gastric fundus mass. 2. Unchanged bulky, matted retroperitoneal, retrocrural, and porta hepatis lymphadenopathy. This appears to extend and directly involve the adjacent pancreatic head and neck, with obstruction of the common bile duct at the margin of the pancreas. 3. Unchanged enhancing left adrenal mass, consistent with metastatic disease. 4. Two small liver lesions of the posterior right lobe of the liver, consistent with hepatic metastatic disease. 5. The portal confluence is sharply narrowed and possibly partially occluded by mass effect of adjacent lymphadenopathy. Mesenteric varices about the ventral  abdominal wall. 6. Large volume ascites throughout the abdomen and pelvis, similar to prior examination. Diffuse peritoneal thickening and enhancement, consistent with peritoneal metastatic disease. 7. Cholelithiasis. Electronically Signed   By: Jearld Lesch M.D.   On: 07/07/2023 19:02   US Paracentesis  Result Date: 07/06/2023 INDICATION: Paracentesis for peritoneal metastasis with large volume free fluid. EXAM: ULTRASOUND GUIDED Therapeutic and Diagnostic  PARACENTESIS MEDICATIONS: None. COMPLICATIONS: None immediate. PROCEDURE: Informed written consent was obtained from the patient after a discussion of the risks, benefits and alternatives to treatment. A timeout was performed prior to the initiation of the procedure. Initial ultrasound scanning demonstrates a large amount of ascites within the right lower abdominal quadrant. The right lower abdomen was prepped and draped in the usual sterile fashion. 1% lidocaine was used for local anesthesia. Following this, a 19 gauge, 7-cm, Yueh catheter was introduced. An ultrasound image was saved for documentation purposes. The paracentesis was performed. The catheter was removed and a dressing was applied. The patient tolerated the procedure well without immediate post procedural complication. FINDINGS: A total of approximately 3.45 liters of clear yellow fluid was removed. Samples were sent to the laboratory as requested by the clinical team. IMPRESSION: Successful ultrasound-guided paracentesis yielding 3.45 liters of peritoneal fluid. Performed by Ardith Dark NP-C Electronically Signed   By: Irish Lack M.D.   On: 07/06/2023 16:16   CT ABDOMEN PELVIS WO CONTRAST  Result Date: 07/06/2023 CLINICAL DATA:  Abdominal pain and flank pain. RIGHT lower quadrant pain. Ongoing chemotherapy. Esophageal cancer. * Tracking Code: BO * is EXAM: CT ABDOMEN AND PELVIS WITHOUT CONTRAST TECHNIQUE: Multidetector CT imaging of the abdomen and pelvis was performed following  the standard protocol without IV contrast. RADIATION DOSE  REDUCTION: This exam was performed according to the departmental dose-optimization program which includes automated exposure control, adjustment of the mA and/or kV according to patient size and/or use of iterative reconstruction technique. COMPARISON:  CT 07/01/2019 FINDINGS: Lower chest: Small LEFT pleural effusion Hepatobiliary: No focal hepatic lesion identified on noncontrast exam gallbladder collapsed. No biliary duct dilatation evident. Pancreas: No pancreatic inflammation identified. Spleen: Normal spleen Adrenals/urinary tract: Probable LEFT adrenal gland metastasis unchanged measuring 2.7 x 4.1 cm. No nephrolithiasis or ureterolithiasis. No bladder calculi. Stomach/Bowel: Mass in the gastric cardiac region and gastrohepatic lumen is less well-defined on noncontrast CT. No small bowel dilatation. Normal colon. Vascular/Lymphatic: Multiple periaortic lymph nodes in the upper abdomen unchanged from prior. Reproductive: Unremarkable Other: Large volume intraperitoneal free fluid unchanged from comparison exam. Peritoneal implant in ventral abdomen not changed at 18 mm (image 30/2) Musculoskeletal: No aggressive osseous lesion. IMPRESSION: 1. No nephrolithiasis ureterolithiasis or obstructive uropathy. 2. No significant interval change from CT 07/01/2023. No IV contrast 3. Large volume gastric mass and gastrohepatic ligament adenopathy unchanged from prior. 4. Peritoneal metastasis and large volume intraperitoneal free fluid not changed. 5. LEFT adrenal gland metastasis unchanged. 6. Small RIGHT pleural effusion. Electronically Signed   By: Genevive Bi M.D.   On: 07/06/2023 08:32   DG Chest Portable 1 View  Result Date: 07/05/2023 CLINICAL DATA:  Weakness abnormal labs from the cancer center. Pt states labs drawn yesterday at [facility] and also states she had a liter of fluid PTA from cancer center. Pt states taking ABX for a UTI, pt endorses  watery type diarrhea. EXAM: PORTABLE CHEST 1 VIEW COMPARISON:  Chest x-ray 07/01/2022, CT abdomen pelvis 07/01/2023, CT chest 08/01/2022 FINDINGS: Axis right chest wall dialysis catheter with tip overlying the superior cavoatrial junction. The heart and mediastinal contours are unchanged. Aortic calcification. No focal consolidation. No pulmonary edema. Left base atelectasis. Trace to small volume left pleural effusion. No pneumothorax. No acute osseous abnormality. IMPRESSION: *Trace to small volume left pleural effusion. * Aortic Atherosclerosis (ICD10-I70.0). Electronically Signed   By: Tish Frederickson M.D.   On: 07/05/2023 22:19   US Paracentesis  Result Date: 07/05/2023 INDICATION: 62 year old female. History of adenocarcinoma of the GE junction. Found to have ascites. Request is for therapeutic and diagnostic paracentesis EXAM: ULTRASOUND GUIDED THERAPEUTIC AND DIAGNOSTIC PARACENTESIS MEDICATIONS: Lidocaine 1% 10 mL COMPLICATIONS: None immediate. PROCEDURE: Informed written consent was obtained from the patient after a discussion of the risks, benefits and alternatives to treatment. A timeout was performed prior to the initiation of the procedure. Initial ultrasound scanning demonstrates a small amount of ascites within the right lower abdominal quadrant. The right lower abdomen was prepped and draped in the usual sterile fashion. 1% lidocaine was used for local anesthesia. Following this, a 19 gauge, 10-cm, Yueh catheter was introduced. An ultrasound image was saved for documentation purposes. The paracentesis was performed. The catheter was removed and a dressing was applied. The patient tolerated the procedure well without immediate post procedural complication. FINDINGS: A total of approximately 2.3 L of cloudy yellow fluid was removed. Samples were sent to the laboratory as requested by the clinical team. IMPRESSION: Successful ultrasound-guided therapeutic and diagnostic paracentesis yielding 2.3  liters of peritoneal fluid. Performed by Anders Grant NP Electronically Signed   By: Olive Bass M.D.   On: 07/05/2023 08:42   CT ABDOMEN PELVIS W CONTRAST  Result Date: 07/01/2023 CLINICAL DATA:  Cancer patient being treated with chemotherapy for GE junction neoplasm, presents with loss of appetite and  increasing abdominal distension, abdominal pain and worsening constipation. 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 EXAM: CT ABDOMEN AND PELVIS WITH CONTRAST TECHNIQUE: Multidetector CT imaging of the abdomen and pelvis was performed using the standard protocol following bolus administration of intravenous contrast. RADIATION DOSE REDUCTION: This exam was performed according to the departmental dose-optimization program which includes automated exposure control, adjustment of the mA and/or kV according to patient size and/or use of iterative reconstruction technique. CONTRAST:  OMNIPAQUE IOHEXOL 300 MG/ML  SOLN COMPARISON:  There are multiple prior CTs. The 2 most recent do not have available reports as they were performed at a [facility] facility. This includes a CT of the abdomen with contrast 05/18/2023 and CT abdomen and pelvis with IV contrast 01/12/2023. The most recent exam with an attached report is CT chest, abdomen and pelvis CT with contrast 08/02/2023. the patient's last PET-CT was 09/15/2022 and also does not have an attached report. FINDINGS: Lower chest: There is subsegmental atelectasis in both bases. There is a small left layering pleural effusion which has increased in volume since 05/18/2023. No right pleural effusion. A port catheter again terminates in the upper right atrium. The cardiac size is normal. Compared with 05/18/2023 there is increasing fold thickening in the distal esophagus and proximal stomach,  increased luminal effacement. There is a periesophageal lymph node on 2:17 which is larger than previously now 1.6 x 1 cm, previously 1.1 x 0.4 cm (2:17). Hepatobiliary: There is a new 1.6 cm hypervascular lesion posteriorly in segment 7 on 2:23 concerning for metastasis. Another measuring 8 mm is noted in segment 4A on 2:18 and is also suspicious. The liver is mildly steatotic. No other mass enhancement is seen. There is cholelithiasis with the gallbladder contracted. There is increased intrahepatic and extrahepatic biliary prominence with the common bile duct now 9 mm. Etiology may be abutting gastrohepatic ligament and periportal adenopathy in the area contributing to this. Pancreas: No primary mass or ductal dilatation, but there is gastrohepatic ligament adenopathy just above the pancreatic neck which is inseparable from the pancreas in may potentially infiltrate the pancreas. Spleen: Somewhat heterogeneous enhancement in the medial spleen initially but uniform enhancement on the delayed images, probably due to perfusional differences or bolus timing. The spleen is not enlarged. Adrenals/Urinary Tract: There is a new solid 1.8 x 1.3 cm right adrenal genu mass almost certainly metastatic. The left adrenal gland is not well seen due to adjacent bulky left para-aortic chain adenopathy and could also be tumor-involved. The kidneys concentrate and excrete contrast symmetrically with no mass enhancement. There is increased trace bilateral perinephric fluid. No hydronephrosis, urinary stones or bladder thickening. Stomach/Bowel: As above there is increased masslike fold thickening at the GE junction extending into the proximal stomach, but there is also increased moderate to severe irregular gastric antral wall thickening, which could be inflammatory or due to additional infiltrating disease. Furthermore the stomach is increasingly fluid distended proximal to this. No small bowel obstruction is seen. An appendix is  not seen in this patient. Large volume of retained stool is increased over priors. No bowel wall thickening. Vascular/Lymphatic: Multifocal bulky adenopathy. There is a conglomerate gastrohepatic ligament mass measuring 8.1 x 5.8 cm on 2:32, previously 7.5 x 4.2 cm. This mass completely encases, and narrows the main hepatic artery and splenic artery and encases and severely narrows the proximal hepatic portal  vein. There are additional portacaval lymph nodes, largest of these is 2.2 x 1.6 cm on 2:34, slightly larger than previously. There is worsened retroperitoneal lymphadenopathy. Bulky left periaortic chain nodes are noted with the largest of these measuring 3.8 x 2.7 cm on 2:31, on the last CT was 3 x 1.9 cm, with multiple others in this chain abutting and narrowing the left renal vein. Multiple enlarged aortocaval lymph nodes are also noted, for example on 2:39 measuring 2.5 x 1.3 cm, previously 2.1 x 1.2 cm. Some of these lymph nodes partially efface the IVC lumen. No pelvic or bulky mesenteric adenopathy is seen. Reproductive: Status post hysterectomy. No adnexal masses. Other: Since 05/18/2023, large-volume abdominal and pelvic ascites has developed in the abdomen and pelvis with scattered fluid in the mesenteric folds. There is no free air, localizing collection or hemorrhage. There is a new mesenteric lymph node in the transverse mesocolon measuring 2.0 x 1.4 cm on 2:45. Scattered omental varices. No hernias. Increased mild body wall anasarca and mesenteric congestive changes. Musculoskeletal: No regional bone metastasis is seen. IMPRESSION: 1. Increasing masslike fold thickening at the GE junction extending into the proximal stomach, with increased moderate to severe irregular gastric antral wall thickening, the latter which could be inflammatory or due to additional infiltrating disease. 2. Increasing fluid distended stomach proximal to the antral fold thickening. 3. Increased size of bulky gastrohepatic  ligament adenopathy which completely encases and narrows the main hepatic artery and splenic artery, and encases and severely narrows the proximal hepatic portal vein. 4. Increased size of a pre-esophageal lymph node, and increased size of portacaval and aortocaval lymph nodes. 5. 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 6. Two new hypervascular liver lesions concerning for metastases. 7. New large-volume abdominal and pelvic ascites with increased omental varices. 8. Increased intrahepatic and extrahepatic biliary dilatation, etiology may be abutting gastrohepatic ligament and periportal adenopathy in the area. 9. Increased mild body wall anasarca and mesenteric congestive changes. 10. Increased small left pleural effusion. 11. Constipation. 12. The left adrenal gland is not well seen due to adjacent bulky left para-aortic chain adenopathy and could also be tumor-involved. Electronically Signed   By: Almira Bar M.D.   On: 07/01/2023 21:50   DG Abd 2 Views  Result Date: 06/30/2023 CLINICAL DATA:  Abdominal distension, rule out obstruction. EXAM: ABDOMEN - 2 VIEW COMPARISON:  CT 05/18/2023 FINDINGS: No free intra-abdominal air. No small bowel distention or evidence of obstruction. There is a large volume of stool throughout the colon. Small volume of stool in the rectum. Multiple pelvic phleboliths. Gallstones on prior CT are not well seen. Small left pleural effusion was present on prior abdominal CT. IMPRESSION: Large volume of stool throughout the colon suggesting constipation. No evidence of bowel obstruction. Electronically Signed   By: Narda Rutherford M.D.   On: 06/30/2023 15:04    Assessment and plan- Patient is a 62 y.o. 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  It would be reasonable to get MRI to further characterize this.  However I discussed with the patient that even if MRI shows obstruction potential options at that time would include ERCP for stent placement versus PTCA if stenting is not possible by ERCP.  Even  if the obstruction is relieved my concern is patient is not tolerating treatments well and she has had a progressive disease recurrence as evidenced by worsening adenopathy and ascites.  I suspect that the patient's overall prognosis is poor likely in weeks.  Her performance status presently does not permit giving any further chemotherapy as an outpatient which would not add any meaningful quality or quantity at this time.  I would recommend proceeding with best supportive care/hospice.  Patient and her husband understand all of their options and they are willing to consider hospice at this time.  She is a DNR/DNI.  Patient's husband would ideally like her to go to a hospice facility instead of coming home with hospice.  Palliative care nurse practitioner NP Laurette Schimke will be coordinating this with case manager to see if going to a hospice facility is an option.  Patient does not have any long-term insurance and therefore going to a nursing home with hospice does not seem to be an option at this time.  I will discuss further with patient and family next week if they would like a Tenckhoff catheter for palliative relief from her ascites.    Thank you for this kind referral and the opportunity to participate in the care of this patient   Visit Diagnosis Metastatic GE junction adenocarcinoma Goals of care counseling/discussion  Dr. Owens Shark, MD, MPH [facility] [phone] 07/09/2023

## 2023-07-09 NOTE — Progress Notes (Signed)
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  Patient has had disease progression and poor tolerance of previous chemotherapy.  Patient presenting with generalized weakness and has a positive urine culture from 10/19. Repeat CT scan shows no significant change from previous CT scan, large volume gastric mass and gastrohepatic ligament adenopathy unchanged from prior, peritoneal metastases and a large volume intraperitoneal free fluid.  Left adrenal gland metastases unchanged, small right pleural effusion.  10/24.  3.45 L drawn off from abdomen with paracentesis. 10/25.  Patient feeling a little bit better.  Hemoglobin drifted down to 7.0.  Blood transfusion ordered.  MRCP showing obstruction of common bile duct and extensive cancer. 10/26.  Hemoglobin up to 8.6.  Patient interested in hospice facility. 10/27.  Patient agreeable to home with hospice until hospice facility opens up.  Assessment and Plan: * Primary malignant neuroendocrine tumor of esophagus (HCC) CT scan and MRCP showing progression of tumor.  Patient is a DNR.  Patient agreeable to go home with hospice until hospice facility opens up.  Equipment to be delivered potentially tomorrow.  Liver lesions on CT concerning for metastases, 07/01/23 MRCP showing tumor causing obstruction of common bile duct.  Ascites s/p paracentesis 07/06/23 3.45 L drawn off with paracentesis on 10/24.  IV albumin given at that time.  Abnormal LFTs MRCP showing obstructing common bile duct secondary to tumor.  I discussed the possibility of ERCP and stent.  Patient decided not to undergo any procedure at this time.  Patient not having any itching at this time.  Chemotherapy-induced pancytopenia (HCC) Patient with  pancytopenia.  Hemoglobin up to 8.6 after transfusion.  Platelet count up to 74.  White count up to 1.3.  Not checking further labs since patient under hospice.  Cancer associated pain Continue fentanyl patch and oxycodone  Generalized weakness Continue working with PT while here.  E. coli UTI Completed antibiotic  Hyponatremia Likely cancer related.  No further lab draws.  Hypotension On midodrine  ATRIAL FIBRILLATION Paroxysmal in nature on flecainide and low-dose metoprolol.         Subjective: Patient feels okay.  Patient is okay with going home with hospice.  Equipment will be delivered tomorrow.  Has some abdominal distention.  Physical Exam: Vitals:   07/09/23 0729 07/09/23 0800 07/09/23 1225 07/09/23 1234  BP: 95/63  (!) 89/60 90/60  Pulse: (!) 118 (!) 109 88   Resp: 18  17   Temp: 97.8 F (36.6 C)  (!) 97.4 F (36.3 C)   TempSrc: Oral     SpO2: 95%  100%   Weight:      Height:       Physical Exam HENT:     Head: Normocephalic.     Mouth/Throat:     Pharynx: No oropharyngeal exudate.  Eyes:     General: Lids are normal.     Comments: Conjunctiva icteric  Cardiovascular:     Rate and Rhythm: Normal rate and regular rhythm.     Heart sounds: Normal heart sounds, S1 normal and S2 normal.  Pulmonary:     Breath sounds: No decreased breath sounds, wheezing, rhonchi or rales.  Abdominal:     General: There is distension.     Palpations: Abdomen is soft.     Tenderness:  There is no abdominal tenderness.  Musculoskeletal:     Right lower leg: No swelling.     Left lower leg: No swelling.  Skin:    General: Skin is warm.     Findings: No rash.  Neurological:     Mental Status: She is alert and oriented to person, place, and time.     Data Reviewed: Normal labs since converted to hospice.  Family Communication: Spoke with daughter at bedside and has been on the phone  Disposition: Status is: Inpatient Remains inpatient appropriate because:  Hospice to deliver equipment likely tomorrow Planned Discharge Destination: Home with hospice    Time spent: 27 minutes  Author: Alford Highland, MD 07/09/2023 1:37 PM  For on call review [email or web address].

## 2023-07-09 NOTE — Assessment & Plan Note (Signed)
Likely cancer related.  Sodium 121 this morning.  Will try to give a dose of tolvaptan serum sodium is high enough for ERCP and stent procedure.

## 2023-07-09 NOTE — Plan of Care (Signed)

## 2023-07-10 ENCOUNTER — Inpatient Hospital Stay: Payer: BC Managed Care – PPO

## 2023-07-10 DIAGNOSIS — K831 Obstruction of bile duct: Secondary | ICD-10-CM | POA: Diagnosis not present

## 2023-07-10 DIAGNOSIS — Z515 Encounter for palliative care: Secondary | ICD-10-CM | POA: Diagnosis not present

## 2023-07-10 DIAGNOSIS — R7989 Other specified abnormal findings of blood chemistry: Secondary | ICD-10-CM | POA: Diagnosis not present

## 2023-07-10 DIAGNOSIS — K7589 Other specified inflammatory liver diseases: Secondary | ICD-10-CM | POA: Diagnosis not present

## 2023-07-10 DIAGNOSIS — R180 Malignant ascites: Secondary | ICD-10-CM | POA: Diagnosis not present

## 2023-07-10 DIAGNOSIS — R932 Abnormal findings on diagnostic imaging of liver and biliary tract: Secondary | ICD-10-CM | POA: Diagnosis not present

## 2023-07-10 DIAGNOSIS — C7A8 Other malignant neuroendocrine tumors: Secondary | ICD-10-CM | POA: Diagnosis not present

## 2023-07-10 LAB — CULTURE, BLOOD (ROUTINE X 2)
Culture: NO GROWTH
Culture: NO GROWTH
Special Requests: ADEQUATE
Special Requests: ADEQUATE

## 2023-07-10 LAB — BODY FLUID CULTURE W GRAM STAIN: Culture: NO GROWTH

## 2023-07-10 MED ORDER — ALBUMIN HUMAN 25 % IV SOLN
12.5000 g | Freq: Once | INTRAVENOUS | Status: DC
  Filled 2023-07-10: qty 50

## 2023-07-10 MED ORDER — METOPROLOL TARTRATE 25 MG PO TABS: 12.5000 mg | ORAL_TABLET | Freq: Two times a day (BID) | ORAL | 0 refills | Status: DC

## 2023-07-10 MED ORDER — MIDODRINE HCL 5 MG PO TABS
5.0000 mg | ORAL_TABLET | Freq: Once | ORAL | Status: AC
  Administered 2023-07-10: 5 mg via ORAL
  Filled 2023-07-10: qty 1

## 2023-07-10 MED ORDER — CIPROFLOXACIN IN D5W 400 MG/200ML IV SOLN
400.0000 mg | Freq: Once | INTRAVENOUS | Status: AC
  Administered 2023-07-10: 400 mg via INTRAVENOUS
  Filled 2023-07-10: qty 200

## 2023-07-10 MED ORDER — ENSURE ENLIVE PO LIQD: 237.0000 mL | Freq: Three times a day (TID) | ORAL | 0 refills | Status: DC

## 2023-07-10 MED ORDER — DICLOFENAC SUPPOSITORY 100 MG
100.0000 mg | Freq: Once | RECTAL | Status: AC
  Administered 2023-07-10: 100 mg via RECTAL
  Filled 2023-07-10 (×2): qty 1

## 2023-07-10 MED ORDER — SODIUM CHLORIDE 0.9 % IV SOLN: INTRAVENOUS | Status: DC

## 2023-07-10 MED ORDER — MIDODRINE HCL 5 MG PO TABS
10.0000 mg | ORAL_TABLET | Freq: Three times a day (TID) | ORAL | Status: DC
  Administered 2023-07-10 – 2023-07-12 (×7): 10 mg via ORAL
  Filled 2023-07-10 (×7): qty 2

## 2023-07-10 MED ORDER — MIDODRINE HCL 10 MG PO TABS: 10.0000 mg | ORAL_TABLET | Freq: Three times a day (TID) | ORAL | 0 refills | Status: DC

## 2023-07-10 NOTE — Progress Notes (Signed)
   07/09/23 2016  Assess: MEWS Score  Temp 98.1 F (36.7 C)  BP (!) 90/52  MAP (mmHg) (!) 64  Pulse Rate (!) 115  Resp 16  SpO2 95 %  O2 Device Room Air  Assess: MEWS Score  MEWS Temp 0  MEWS Systolic 1  MEWS Pulse 2  MEWS RR 0  MEWS LOC 0  MEWS Score 3  MEWS Score Color Yellow  Assess: if the MEWS score is Yellow or Red  Were vital signs accurate and taken at a resting state? Yes  Does the patient meet 2 or more of the SIRS criteria? No  Does the patient have a confirmed or suspected source of infection? No  MEWS guidelines implemented  No, previously yellow, continue vital signs every 4 hours  Notify: Charge Nurse/RN  Name of Charge Nurse/RN Notified Lupita Leash, RN  Assess: SIRS CRITERIA  SIRS Temperature  0  SIRS Pulse 1  SIRS Respirations  0  SIRS WBC 0  SIRS Score Sum  1   Pt had just walked to [place] with husband.  She has Hx Afib.  Will be going home w/hospice tomorrow.

## 2023-07-10 NOTE — H&P (Signed)
Interventional Radiology - Pre-procedure H&P    Referring Provider: Alford Highland, MD  Planned Procedure: Tunneled peritoneal drainage catheter     History of Present Illness  Ariana Wilson is a 62 y.o. female with a relevant past medical history of metastatic neuroendocrine tumor with peritoneal disease and recurrent ascites.   Consult requested for long term drainage catheter for manage her ascites in the hospice setting.    Additional Past Medical History Past Medical History:  Diagnosis Date   Anemia    Arthritis    Atrial fibrillation (HCC)    B12 deficiency    Cancer (HCC)    Dysrhythmia/history A. Fib    Hyperlipidemia    Hypothyroidism    Menopause    Osteoarthritis    Palpitations    Hx of, 25 years ago   Vertigo     Surgical History  Past Surgical History:  Procedure Laterality Date   BILATERAL SALPINGOOPHORECTOMY  2008   DILATION AND CURETTAGE OF UTERUS  08/24/2006   ESOPHAGOGASTRODUODENOSCOPY (EGD) WITH PROPOFOL N/A 03/01/2022   Procedure: ESOPHAGOGASTRODUODENOSCOPY (EGD) WITH PROPOFOL;  Surgeon: Regis Bill, MD;  Location: [facility] ENDOSCOPY;  Service: Endoscopy;  Laterality: N/A;   ESOPHAGOGASTRODUODENOSCOPY (EGD) WITH PROPOFOL N/A 05/04/2022   Procedure: ESOPHAGOGASTRODUODENOSCOPY (EGD) WITH PROPOFOL;  Surgeon: [name], MD;  Location: [facility] ENDOSCOPY;  Service: Gastroenterology;  Laterality: N/A;   ESOPHAGOGASTRODUODENOSCOPY (EGD) WITH PROPOFOL N/A 06/01/2023   Procedure: ESOPHAGOGASTRODUODENOSCOPY (EGD) WITH PROPOFOL;  Surgeon: [name], MD;  Location: [facility] ENDOSCOPY;  Service: Gastroenterology;  Laterality: N/A;   HEMOSTASIS CONTROL  06/01/2023   Procedure: HEMOSTASIS CONTROL;  Surgeon: [name], MD;  Location: [facility] ENDOSCOPY;  Service: Gastroenterology;;   incision tendon sheath for trigger finger Right    ring finger   LAPAROSCOPIC SUPRACERVICAL HYSTERECTOMY  2008   Dr. Arvil Chaco; with [facility]   NOSE SURGERY  03/2008   PORTA CATH INSERTION  N/A 03/21/2022   Procedure: PORTA CATH INSERTION;  Surgeon: Annice Needy, MD;  Location: [facility] INVASIVE CV LAB;  Service: Cardiovascular;  Laterality: N/A;     Medications  I have reviewed the current medication list. Refer to chart for details. Current Outpatient Medications  Medication Instructions   Calcium Carbonate-Vitamin D 600-200 MG-UNIT TABS 1 tablet, Daily   cetirizine (ZYRTEC) 10 mg, Oral, Daily PRN   Cholecalciferol 1,000 Units, Daily   cyanocobalamin (VITAMIN B12) 1,000 mcg, Oral, Daily   dexamethasone (DECADRON) 8 mg, Oral, Daily, Start the day after chemotherapy for 2 days. Take with food.   dicyclomine (BENTYL) 20 mg, Oral, 2 times daily   diphenoxylate-atropine (LOMOTIL) 2.5-0.025 MG tablet 2 tablets, Oral, Every 6 hours PRN   famotidine (PEPCID) 40 MG tablet 1 tablet, Daily at bedtime   feeding supplement (ENSURE ENLIVE / ENSURE PLUS) LIQD 237 mLs, Oral, 3 times daily between meals   fentaNYL (DURAGESIC) 25 MCG/HR 1 patch, Transdermal, every 72 hours   flecainide (TAMBOCOR) 50 MG tablet 1 tablet, Oral,  Once   lactulose (CHRONULAC) 10-20 g, Oral, 3 times daily PRN   lidocaine-prilocaine (EMLA) cream Apply to affected area once   loperamide (IMODIUM) 2 MG capsule Take 2 tabs by mouth with first loose stool, then 1 tab with each additional loose stool as needed. Do not exceed 8 tabs in a 24-hour period   Melatonin 10 mg, Oral, Daily PRN   metoprolol tartrate (LOPRESSOR) 12.5 mg, Oral, 2 times daily   midodrine (PROAMATINE) 10 mg, Oral, 3 times daily with meals   mometasone (NASONEX) 50  MCG/ACT nasal spray 2 sprays, Nasal, Daily   OLANZapine (ZYPREXA) 10 mg, Oral, Daily at bedtime   ondansetron (ZOFRAN) 8 mg, Oral, Every 8 hours PRN, Start on the third day after chemotherapy.   oxyCODONE (OXY IR/ROXICODONE) 5 mg, Oral, Every 4 hours PRN   pantoprazole (PROTONIX) 40 mg, Oral, Daily   polyethylene glycol (MIRALAX / GLYCOLAX) 17 g, Oral, Daily PRN   prochlorperazine  (COMPAZINE) 10 mg, Oral, Every 6 hours PRN   promethazine (PHENERGAN) 25 mg, Oral, Every 8 hours PRN, May cause sleepiness.   psyllium (METAMUCIL) 58.6 % powder 1 packet, Oral, 3 times daily PRN   simvastatin (ZOCOR) 20 MG tablet 1 tablet, Oral, Daily at bedtime   traZODone (DESYREL) 50 mg, Oral, At bedtime PRN   Zinc Citrate-Phytase (ZYTAZE) 25-500 MG CAPS 1 Dose, Daily      Allergies Allergies  Allergen Reactions   Sulfa Antibiotics Hives    Says her tongue swelled a bit   Sulfonamide Derivatives Swelling    Tongue swelling      Physical Exam Current Vitals Temp: 98.4 F (36.9 C) (Temp Source: Oral)  Pulse Rate: (!) 108  Resp: 17  BP: (!) 91/59  SpO2: 96 %  Height: 5' (152.4 cm)  Weight: 67.6 kg  Body mass index is 29.1 kg/m.  General: Alert and answers questions appropriately. No apparent distress. HEENT: Normocephalic, atraumatic. Conjunctivae normal without scleral icterus. Mallampati score: II (hard and soft palate, upper portion of tonsils anduvula visible) Cardiac: Tachycardic. No dependent edema. Pulmonary: Normal work of breathing. On room air. Abdominal: Distended.    Pertinent Lab Results Labs: CBC:    BMP:   Coagulation:    CBC Trends: Recent Labs    07/08/23 0545  WBC 1.3*  HGB 8.6*  HCT 23.9*  PLT 74*     Creatinine Trend: Recent Labs    07/08/23 0545  CREATININE 0.42*      Assessment & Plan Ariana Wilson is a 62 y.o. female with a history of malignant ascites seen today for tunneled peritoneal drainage catheter.   Patient is appropriate candidate for the procedure. Risks and benefits discussed, including but not limited to procedure-specific risks of infection and/or catheter malfunction, and patient is agreeable to proceed.    Procedure Checklist:  Consent obtained: Risks of the procedure as well as the alternatives and risks of each were explained to the patient and/or caregiver.  Consent for the procedure was obtained and is  signed in the bedside chart Consent obtained from: The patient Patient is appropriate candidate for sedation Yes ASA Classification: ASA 3 - Patient with moderate systemic disease with functional limitations     Olive Bass, MD  Vascular and Interventional Radiology 07/10/2023 4:20 PM

## 2023-07-10 NOTE — Progress Notes (Signed)
Physical Therapy Treatment Patient Details Name: Ariana Wilson MRN: [id number] DOB: [birth year] Today's Date: 07/10/2023   History of Present Illness Ariana Wilson is a 62 y.o. 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 coli from 10/19.    PT Comments  Patient declined walking in hallway due to low energy. Patient ambulated in room with CGA-supervision using rolling walker with reinforcement of proper rolling walker positioning. No dizziness or shortness of breath noted with activity. Encouraged energy conservation strategies as needed. PT will continue to follow.     If plan is discharge home, recommend the following: A little help with walking and/or transfers;Assist for transportation   Can travel by private vehicle        Equipment Recommendations  None recommended by PT    Recommendations for Other Services       Precautions / Restrictions Precautions Precautions: Fall Restrictions Weight Bearing Restrictions: No     Mobility  Bed Mobility Overal bed mobility: Needs Assistance Bed Mobility: Supine to Sit, Sit to Supine     Supine to sit: HOB elevated, Supervision Sit to supine: Supervision, HOB elevated   General bed mobility comments: increased time required, no physical assistance. no dizziness reported with upright activity    Transfers Overall transfer level: Needs assistance Equipment used: Rolling walker (2 wheels) Transfers: Sit to/from Stand Sit to Stand: Supervision           General transfer comment: with standing from bed and from toilet    Ambulation/Gait Ambulation/Gait assistance: Contact guard assist, Supervision Gait Distance (Feet):  (74ft, 64ft) Assistive device: Rolling walker (2 wheels) Gait Pattern/deviations: Step-through pattern Gait velocity:  decreased     General Gait Details: reinforced proper placement of rolling walker and to avoid trunk flexion with carry over demonstrated. patient has limited energy for progression of ambulation further at this time. encouraged energy conservation strategies as appropriate   Stairs             Wheelchair Mobility     Tilt Bed    Modified Rankin (Stroke Patients Only)       Balance Overall balance assessment: Mild deficits observed, not formally tested                                          Cognition Arousal: Alert Behavior During Therapy: Flat affect Overall Cognitive Status: Within Functional Limits for tasks assessed                                          Exercises      General Comments        Pertinent Vitals/Pain Pain Assessment Pain Assessment: No/denies pain    Home Living                          Prior Function            PT Goals (current goals can now be found in the care plan section) Acute Rehab PT Goals Patient Stated Goal: feel better PT Goal Formulation: With patient Time For Goal Achievement: 07/20/23 Potential to Achieve Goals: Good Progress  towards PT goals: Progressing toward goals    Frequency    Min 1X/week      PT Plan      Co-evaluation              AM-PAC PT "6 Clicks" Mobility   Outcome Measure    Help needed moving from lying on your back to sitting on the side of a flat bed without using bedrails?: None Help needed moving to and from a bed to a chair (including a wheelchair)?: A Little Help needed standing up from a chair using your arms (e.g., wheelchair or bedside chair)?: A Little Help needed to walk in hospital room?: A Little Help needed climbing 3-5 steps with a railing? : A Little 6 Click Score: 16    End of Session   Activity Tolerance: Patient limited by fatigue Patient left: in bed;with call bell/phone within reach;with family/visitor  present (sister at tne bedside)   PT Visit Diagnosis: Unsteadiness on feet (R26.81);Muscle weakness (generalized) (M62.81)     Time: 7829-5621 PT Time Calculation (min) (ACUTE ONLY): 14 min  Charges:    $Therapeutic Activity: 8-22 mins PT General Charges $$ ACUTE PT VISIT: 1 Visit                     Donna Bernard, PT, MPT    Ina Homes 07/10/2023, 2:51 PM

## 2023-07-10 NOTE — Progress Notes (Signed)
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  Patient has had disease progression and poor tolerance of previous chemotherapy.  Patient presenting with generalized weakness and has a positive urine culture from 10/19. Repeat CT scan shows no significant change from previous CT scan, large volume gastric mass and gastrohepatic ligament adenopathy unchanged from prior, peritoneal metastases and a large volume intraperitoneal free fluid.  Left adrenal gland metastases unchanged, small right pleural effusion.  10/24.  3.45 L drawn off from abdomen with paracentesis. 10/25.  Patient feeling a little bit better.  Hemoglobin drifted down to 7.0.  Blood transfusion ordered.  MRCP showing obstruction of common bile duct and extensive cancer. 10/26.  Hemoglobin up to 8.6.  Patient interested in hospice facility. 10/27.  Patient agreeable to home with hospice until hospice facility opens up. 10/28.  Patient seen by palliative care and looking into ERCP with stent and abdominal drainage catheter.  Interventional radiology team and GI will reevaluate for potential procedures.  Assessment and Plan: * Primary malignant neuroendocrine tumor of esophagus (HCC) CT scan and MRCP showing progression of tumor.  Patient is a DNR.  Patient agreeable to go home with hospice until hospice facility opens up.  Equipment to be delivered today.  Seen by palliative care and they are looking into possibility of procedures prior to disposition.  Liver lesions on CT concerning for metastases, 07/01/23 MRCP showing tumor causing obstruction of common bile duct.  GI to look at case to see if she is a candidate for ERCP and stent.  Abnormal LFTs MRCP showing obstructing common bile duct secondary to  tumor.  After speaking with palliative care today patient changed her mind and is interested in seeing if we can get an ERCP and stent.  Previously she had declined.  No itching.  Ascites s/p paracentesis 07/06/23 3.45 L drawn off with paracentesis on 10/24.  IV albumin given at that time.  Palliative care spoke with interventional radiology to see if we can place a abdominal drainage tube.  Will make n.p.o. after midnight.  Chemotherapy-induced pancytopenia (HCC) Patient with pancytopenia.  Hemoglobin up to 8.6 after transfusion.  Platelet count up to 74.  White count up to 1.3.  Not checking further labs since patient under hospice.  Cancer associated pain Continue fentanyl patch and oxycodone  Generalized weakness Continue working with PT while here.  E. coli UTI Completed antibiotic  Hyponatremia Likely cancer related.  No further lab draws.  Hypotension On midodrine 10 mg 3 times daily  ATRIAL FIBRILLATION Paroxysmal in nature on flecainide and low-dose metoprolol.         Subjective: Patient feels okay.  Patient was seen by palliative care and palliative care asking specialist to look into abdominal drainage catheter and ERCP and stent.  Previously patient declined ERCP and stent.  Physical Exam: Vitals:   07/09/23 2016 07/10/23 0639 07/10/23 0736 07/10/23 1154  BP: (!) 90/52 (!) 95/56 (!) 82/58 (!) 91/59  Pulse: (!) 115 (!) 108 (!) 113 (!) 108  Resp: 16 16 18 17   Temp: 98.1 F (36.7 C) 98.4 F (36.9 C) 98.7 F (37.1 C) 98.4 F (36.9 C)  TempSrc: Oral     SpO2: 95% 93% 96% 96%  Weight:      Height:  Physical Exam HENT:     Head: Normocephalic.     Mouth/Throat:     Pharynx: No oropharyngeal exudate.  Eyes:     General: Lids are normal.     Comments: Conjunctiva icteric  Cardiovascular:     Rate and Rhythm: Normal rate and regular rhythm.     Heart sounds: Normal heart sounds, S1 normal and S2 normal.  Pulmonary:     Breath sounds: No decreased  breath sounds, wheezing, rhonchi or rales.  Abdominal:     General: There is distension.     Palpations: Abdomen is soft.     Tenderness: There is no abdominal tenderness.  Musculoskeletal:     Right lower leg: No swelling.     Left lower leg: No swelling.  Skin:    General: Skin is warm.     Findings: No rash.  Neurological:     Mental Status: She is alert and oriented to person, place, and time.     Data Reviewed: Since patient is a hospice patient and will no longer draw labs.  Family Communication: Spoke with husband at the bedside  Disposition: Status is: Inpatient Remains inpatient appropriate because: Specialist to evaluate for potential procedures ERCP and stent and abdominal drainage catheter.  Planned Discharge Destination: Home with hospice    Time spent: 28 minutes Case discussed with palliative care, oncology, GI and IR.  Author: Alford Highland, MD 07/10/2023 12:10 PM  For on call review [email or web address].

## 2023-07-10 NOTE — Progress Notes (Signed)
   07/10/23 1930  Spiritual Encounters  Type of Visit Follow up  Care provided to: Pt and family  Referral source Family  Reason for visit Routine spiritual support  OnCall Visit Yes   After having met husband of patient in hallway, Chaplain went to the room to support patient in her battle with cancer.

## 2023-07-10 NOTE — TOC Progression Note (Signed)
Transition of Care [facility]) - Progression Note    Patient Details  Name: NAJJA KAUL MRN: [id number] Date of Birth: 04-20-1961  Transition of Care [facility]) CM/SW Contact  Allena Katz, LCSW Phone Number: 07/10/2023, 10:18 AM  Clinical Narrative:   Sharia Reeve borders reports that he would like [facility] and [facility] hosp[facility] to reassess for inpatient. He would like for DME to go ahead and be delivered in case they are unable to take but states the patient really is wanting to go inpatient. [facility] and [facility] updated and will reassess today.          Expected Discharge Plan and Services                                               Social Determinants of Health (SDOH) Interventions SDOH Screenings   Food Insecurity: No Food Insecurity (07/06/2023)  Housing: Low Risk  (07/06/2023)  Transportation Needs: No Transportation Needs (07/06/2023)  Utilities: Not At Risk (07/06/2023)  Alcohol Screen: Low Risk  (04/20/2022)  Depression (PHQ2-9): Low Risk  (04/20/2022)  Financial Resource Strain: Low Risk  (04/18/2023)   Received from [facility]  Physical Activity: Inactive (04/20/2022)  Social Connections: Socially Integrated (04/20/2022)  Stress: No Stress Concern Present (04/20/2022)  Tobacco Use: Low Risk  (07/05/2023)    Readmission Risk Interventions     No data to display

## 2023-07-10 NOTE — Progress Notes (Signed)
Nutrition Brief Note  Chart reviewed. Per palliative care notes, no plans to pursue further treatments. Plan to d/c home with hospice with plans to transition to hospice house in the future.  No further nutrition interventions planned at this time.  Please re-consult as needed.   Levada Schilling, RD, LDN, CDCES Registered Dietitian III Certified Diabetes Care and Education Specialist Please refer to [facility] for RD and/or RD on-call/weekend/after hours pager

## 2023-07-10 NOTE — Progress Notes (Signed)
Ariana Repress, MD [address]  [address]  [place], [place] [address]  Main: [phone]  Fax: [phone] Pager: [phone]   Subjective: Patient reports poor appetite, denies any abdominal pain, nausea or vomiting. I was asked by Dr. Renae Gloss to reevaluate the patient for ERCP based on MRCP results Patient was originally evaluated by Dr. Tobi Bastos on 07/06/2023 for elevated LFTs, predominantly cholestatic hepatitis.  There was no evidence of ascending cholangitis.  MRCP was recommended at that time  Objective: Vital signs in last 24 hours: Vitals:   07/09/23 2016 07/10/23 0639 07/10/23 0736 07/10/23 1154  BP: (!) 90/52 (!) 95/56 (!) 82/58 (!) 91/59  Pulse: (!) 115 (!) 108 (!) 113 (!) 108  Resp: 16 16 18 17   Temp: 98.1 F (36.7 C) 98.4 F (36.9 C) 98.7 F (37.1 C) 98.4 F (36.9 C)  TempSrc: Oral     SpO2: 95% 93% 96% 96%  Weight:      Height:       Weight change:   Intake/Output Summary (Last 24 hours) at 07/10/2023 1707 Last data filed at 07/10/2023 1020 Gross per 24 hour  Intake 120 ml  Output --  Net 120 ml     Exam: Heart: Tachycardia, regular rhythm, S1S2 present or without murmur or extra heart sounds Lungs: clear to auscultation Abdomen: soft, nontender, diffusely distended, hypoactive bowel sounds   Lab Results:    Latest Ref Rng & Units 07/08/2023    5:45 AM 07/07/2023    6:29 AM 07/06/2023    6:02 AM  CBC  WBC 4.0 - 10.5 K/uL 1.3  1.1  1.4   Hemoglobin 12.0 - 15.0 g/dL 8.6  7.0  7.8   Hematocrit 36.0 - 46.0 % 23.9  20.5  23.8   Platelets 150 - 400 K/uL 74  46  37       Latest Ref Rng & Units 07/08/2023    5:45 AM 07/07/2023    6:29 AM 07/06/2023    6:02 AM  CMP  Glucose 70 - 99 mg/dL 98  332  99   BUN 8 - 23 mg/dL 30  24  22    Creatinine 0.44 - 1.00 mg/dL 9.51  8.84  1.66   Sodium 135 - 145 mmol/L 128  130  132   Potassium 3.5 - 5.1 mmol/L 4.0  4.0  4.0   Chloride 98 - 111 mmol/L 98  99  103   CO2 22 - 32 mmol/L 22  24  23     Calcium 8.9 - 10.3 mg/dL 7.7  7.8  7.6   Total Protein 6.5 - 8.1 g/dL  5.1  4.6   Total Bilirubin 0.3 - 1.2 mg/dL  4.5  4.0   Alkaline Phos 38 - 126 U/L  768  824   AST 15 - 41 U/L  134  154   ALT 0 - 44 U/L  182  217     Micro Results: Recent Results (from the past 240 hour(s))  Urine Culture     Status: Abnormal   Collection Time: 07/01/23 11:40 PM   Specimen: Urine, Catheterized  Result Value Ref Range Status   Specimen Description   Final    URINE, CATHETERIZED Performed at [facility], [address], [place], [place] [address]    Special Requests   Final    NONE Performed at [facility], [address] [place], [place] [address]    Culture >=100,000 COLONIES/mL ESCHERICHIA COLI (A)  Final  Report Status 07/04/2023 FINAL  Final   Organism ID, Bacteria ESCHERICHIA COLI (A)  Final      Susceptibility   Escherichia coli - MIC*    AMPICILLIN >=32 RESISTANT Resistant     CEFAZOLIN <=4 SENSITIVE Sensitive     CEFEPIME <=0.12 SENSITIVE Sensitive     CEFTRIAXONE <=0.25 SENSITIVE Sensitive     CIPROFLOXACIN <=0.25 SENSITIVE Sensitive     GENTAMICIN <=1 SENSITIVE Sensitive     IMIPENEM <=0.25 SENSITIVE Sensitive     NITROFURANTOIN <=16 SENSITIVE Sensitive     TRIMETH/SULFA <=20 SENSITIVE Sensitive     AMPICILLIN/SULBACTAM 16 INTERMEDIATE Intermediate     PIP/TAZO <=4 SENSITIVE Sensitive ug/mL    * >=100,000 COLONIES/mL ESCHERICHIA COLI  Body fluid culture w Gram Stain     Status: None   Collection Time: 07/04/23  4:11 PM   Specimen: PATH Cytology Peritoneal fluid  Result Value Ref Range Status   Specimen Description   Final    FLUID Performed at [facility], [address] [place], [place] [address]    Special Requests PERITONEAL  Final   Gram Stain NO WBC SEEN NO ORGANISMS SEEN   Final   Culture   Final    NO GROWTH 3 DAYS Performed at [facility], [address], [place], [place] [address]    Report Status 07/08/2023  FINAL  Final  Blood culture (routine x 2)     Status: None   Collection Time: 07/05/23  9:29 PM   Specimen: BLOOD  Result Value Ref Range Status   Specimen Description BLOOD PORTA CATH  Final   Special Requests   Final    BOTTLES DRAWN AEROBIC AND ANAEROBIC Blood Culture adequate volume   Culture   Final    NO GROWTH 5 DAYS Performed at [facility], [address] [place], [place] [address]    Report Status 07/10/2023 FINAL  Final  Blood culture (routine x 2)     Status: None   Collection Time: 07/05/23  9:29 PM   Specimen: BLOOD  Result Value Ref Range Status   Specimen Description BLOOD BLOOD LEFT ARM  Final   Special Requests   Final    BOTTLES DRAWN AEROBIC AND ANAEROBIC Blood Culture adequate volume   Culture   Final    NO GROWTH 5 DAYS Performed at [facility] [address] [place], [place] [address]    Report Status 07/10/2023 FINAL  Final  Resp panel by RT-PCR (RSV, Flu A&B, Covid) Anterior Nasal Swab     Status: None   Collection Time: 07/05/23 10:50 PM   Specimen: Anterior Nasal Swab  Result Value Ref Range Status   SARS Coronavirus 2 by RT PCR NEGATIVE NEGATIVE Final    Comment: (NOTE) SARS-CoV-2 target nucleic acids are NOT DETECTED.  The SARS-CoV-2 RNA is generally detectable in upper respiratory specimens during the acute phase of infection. The lowest concentration of SARS-CoV-2 viral copies this assay can detect is 138 copies/mL. A negative result does not preclude SARS-Cov-2 infection and should not be used as the sole basis for treatment or other patient management decisions. A negative result may occur with  improper specimen collection/handling, submission of specimen other than nasopharyngeal swab, presence of viral mutation(s) within the areas targeted by this assay, and inadequate number of viral copies(<138 copies/mL). A negative result must be combined with clinical observations, patient history, and  epidemiological information. The expected result is Negative.  Fact Sheet for Patients:  BloggerCourse.com  Fact Sheet for Healthcare Providers:  SeriousBroker.it  This test is no t yet approved or cleared by the [place] FDA and  has been authorized for detection and/or diagnosis of SARS-CoV-2 by FDA under an Emergency Use Authorization (EUA). This EUA will remain  in effect (meaning this test can be used) for the duration of the COVID-19 declaration under Section 564(b)(1) of the Act, 21 U.S.C.section 360bbb-3(b)(1), unless the authorization is terminated  or revoked sooner.       Influenza A by PCR NEGATIVE NEGATIVE Final   Influenza B by PCR NEGATIVE NEGATIVE Final    Comment: (NOTE) The Xpert Xpress SARS-CoV-2/FLU/RSV plus assay is intended as an aid in the diagnosis of influenza from Nasopharyngeal swab specimens and should not be used as a sole basis for treatment. Nasal washings and aspirates are unacceptable for Xpert Xpress SARS-CoV-2/FLU/RSV testing.  Fact Sheet for Patients: BloggerCourse.com  Fact Sheet for Healthcare Providers: SeriousBroker.it  This test is not yet approved or cleared by the [place] FDA and has been authorized for detection and/or diagnosis of SARS-CoV-2 by FDA under an Emergency Use Authorization (EUA). This EUA will remain in effect (meaning this test can be used) for the duration of the COVID-19 declaration under Section 564(b)(1) of the Act, 21 U.S.C. section 360bbb-3(b)(1), unless the authorization is terminated or revoked.     Resp Syncytial Virus by PCR NEGATIVE NEGATIVE Final    Comment: (NOTE) Fact Sheet for Patients: BloggerCourse.com  Fact Sheet for Healthcare Providers: SeriousBroker.it  This test is not yet approved or cleared by the [place] FDA and has been  authorized for detection and/or diagnosis of SARS-CoV-2 by FDA under an Emergency Use Authorization (EUA). This EUA will remain in effect (meaning this test can be used) for the duration of the COVID-19 declaration under Section 564(b)(1) of the Act, 21 U.S.C. section 360bbb-3(b)(1), unless the authorization is terminated or revoked.  Performed at [facility], [address], [place], [place] [address]   Body fluid culture w Gram Stain     Status: None   Collection Time: 07/06/23  3:17 PM   Specimen: PATH Cytology Peritoneal fluid  Result Value Ref Range Status   Specimen Description   Final    PERITONEAL Performed at [facility], [address], [place], [place] [address]    Special Requests   Final    NONE Performed at [facility], [address] [place], [place] [address]    Gram Stain   Final    CYTOSPIN SMEAR WBC PRESENT, PREDOMINANTLY MONONUCLEAR NO ORGANISMS SEEN    Culture   Final    NO GROWTH 3 DAYS Performed at [facility], [address] [place], [place] [address]    Report Status 07/10/2023 FINAL  Final   Studies/Results: No results found. Medications: I have reviewed the patient's current medications. Prior to Admission:  Medications Prior to Admission  Medication Sig Dispense Refill Last Dose   cetirizine (ZYRTEC) 10 MG tablet Take 10 mg by mouth daily as needed for allergies or rhinitis.   unk   cyanocobalamin (VITAMIN B12) 1000 MCG tablet Take 1,000 mcg by mouth daily.   07/05/2023   dexamethasone (DECADRON) 4 MG tablet Take 2 tablets (8 mg total) by mouth daily. Start the day after chemotherapy for 2 days. Take with food. 8 tablet 5 unk   dicyclomine (BENTYL) 20 MG tablet Take 20 mg by mouth 2 (two) times daily.   07/05/2023   diphenoxylate-atropine (LOMOTIL)  2.5-0.025 MG tablet Take 2 tablets by mouth every 6 (six) hours as needed for diarrhea or loose stools. 120 tablet 0 unk   fentaNYL (DURAGESIC) 25 MCG/HR  Place 1 patch onto the skin every 3 (three) days. 10 patch 0 07/06/2023   flecainide (TAMBOCOR) 50 MG tablet Take 1 tablet by mouth once.   07/06/2023   lactulose (CHRONULAC) 10 GM/15ML solution Take 15-30 mLs (10-20 g total) by mouth 3 (three) times daily as needed for mild constipation. 240 mL 3 unk   lidocaine-prilocaine (EMLA) cream Apply to affected area once (Patient taking differently: Apply 1 Application topically daily as needed. Apply to affected area once) 30 g 3 unk   Melatonin 10 MG TABS Take 10 mg by mouth daily as needed.   unk   mometasone (NASONEX) 50 MCG/ACT nasal spray Place 2 sprays into the nose daily.   07/05/2023   OLANZapine (ZYPREXA) 10 MG tablet Take 1 tablet (10 mg total) by mouth at bedtime. (Patient taking differently: Take 10 mg by mouth daily as needed.) 30 tablet 1 unk   ondansetron (ZOFRAN) 8 MG tablet Take 1 tablet (8 mg total) by mouth every 8 (eight) hours as needed for nausea, vomiting or refractory nausea / vomiting. Start on the third day after chemotherapy. 30 tablet 1 unk   oxyCODONE (OXY IR/ROXICODONE) 5 MG immediate release tablet Take 1 tablet (5 mg total) by mouth every 4 (four) hours as needed for severe pain. 60 tablet 0 unk   pantoprazole (PROTONIX) 40 MG tablet Take 40 mg by mouth daily.   07/05/2023   polyethylene glycol (MIRALAX / GLYCOLAX) 17 g packet Take 17 g by mouth daily as needed for mild constipation, moderate constipation or severe constipation.   unk   prochlorperazine (COMPAZINE) 10 MG tablet Take 10 mg by mouth every 6 (six) hours as needed.   unk   promethazine (PHENERGAN) 25 MG tablet Take 1 tablet (25 mg total) by mouth every 8 (eight) hours as needed for refractory nausea / vomiting. May cause sleepiness. 30 tablet 0 unk   psyllium (METAMUCIL) 58.6 % powder Take 1 packet by mouth 3 (three) times daily as needed.   unk   simvastatin (ZOCOR) 20 MG tablet Take 1 tablet by mouth at bedtime.   Past Week   traZODone (DESYREL) 50 MG tablet  Take 50 mg by mouth at bedtime as needed.   unk   [DISCONTINUED] metoprolol tartrate (LOPRESSOR) 25 MG tablet Take 25 mg by mouth 2 (two) times daily as needed.   unk   [DISCONTINUED] midodrine (PROAMATINE) 10 MG tablet Take 10 mg by mouth 2 (two) times daily.   07/05/2023   Calcium Carbonate-Vitamin D 600-200 MG-UNIT TABS Take 1 tablet by mouth daily.   (Patient not taking: Reported on 06/13/2023)   Not Taking   [EXPIRED] cephALEXin (KEFLEX) 500 MG capsule Take 1 capsule (500 mg total) by mouth 3 (three) times daily for 5 days. (Patient not taking: Reported on 07/06/2023) 15 capsule 0 Not Taking   Cholecalciferol 25 MCG (1000 UT) tablet Take 1,000 Units by mouth daily. (Patient not taking: Reported on 06/13/2023)   Not Taking   famotidine (PEPCID) 40 MG tablet Take 1 tablet by mouth at bedtime. (Patient not taking: Reported on 06/13/2023)   Not Taking   loperamide (IMODIUM) 2 MG capsule Take 2 tabs by mouth with first loose stool, then 1 tab with each additional loose stool as needed. Do not exceed 8 tabs in a 24-hour period (Patient  not taking: Reported on 07/06/2023) 60 capsule 2 Not Taking   Zinc Citrate-Phytase (ZYTAZE) 25-500 MG CAPS Take 1 Dose by mouth daily. (Patient not taking: Reported on 06/13/2023)   Not Taking   Scheduled:  Chlorhexidine Gluconate Cloth  6 each Topical Daily   diclofenac  100 mg Rectal Once   dicyclomine  20 mg Oral BID   feeding supplement  237 mL Oral TID BM   fentaNYL  1 patch Transdermal Q72H   flecainide  50 mg Oral Q breakfast   metoprolol tartrate  12.5 mg Oral BID   midodrine  10 mg Oral TID WC   multivitamin with minerals  1 tablet Oral Daily   OLANZapine  10 mg Oral QHS   Continuous: OEV:[id number] **OR** acetaminophen, diphenoxylate-atropine, HYDROcodone-acetaminophen, lactulose, morphine injection, ondansetron **OR** ondansetron (ZOFRAN) IV, oxyCODONE, traZODone Anti-infectives (From admission, onward)    Start     Dose/Rate Route Frequency Ordered  Stop   07/06/23 2100  cefTRIAXone (ROCEPHIN) 1 g in sodium chloride 0.9 % 100 mL IVPB        1 g 200 mL/hr over 30 Minutes Intravenous Every 24 hours 07/06/23 0115 07/08/23 2119   07/05/23 2000  cefTRIAXone (ROCEPHIN) 1 g in sodium chloride 0.9 % 100 mL IVPB        1 g 200 mL/hr over 30 Minutes Intravenous  Once 07/05/23 1948 07/05/23 2120      Scheduled Meds:  Chlorhexidine Gluconate Cloth  6 each Topical Daily   diclofenac  100 mg Rectal Once   dicyclomine  20 mg Oral BID   feeding supplement  237 mL Oral TID BM   fentaNYL  1 patch Transdermal Q72H   flecainide  50 mg Oral Q breakfast   metoprolol tartrate  12.5 mg Oral BID   midodrine  10 mg Oral TID WC   multivitamin with minerals  1 tablet Oral Daily   OLANZapine  10 mg Oral QHS   Continuous Infusions: PRN Meds:.acetaminophen **OR** acetaminophen, diphenoxylate-atropine, HYDROcodone-acetaminophen, lactulose, morphine injection, ondansetron **OR** ondansetron (ZOFRAN) IV, oxyCODONE, traZODone   Assessment: Principal Problem:   Primary malignant neuroendocrine tumor of esophagus (HCC) Active Problems:   ATRIAL FIBRILLATION   Chemotherapy-induced pancytopenia (HCC)   Hypotension   Hypothyroidism, adult   Cancer associated pain   E. coli UTI   Generalized weakness   Abnormal LFTs   Ascites s/p paracentesis 07/06/23   Liver lesions on CT concerning for metastases, 07/01/23   Palliative care encounter   Hyponatremia  Ariana Wilson is a 62 y.o. y/o female with metastatic neuroendocrine tumor of the esophagus admitted with abnormal LFTs and failure to thrive.Prior abdominal imaging has demonstrated metastatic disease with lesions in the liver per CT scan on 07/01/2023, progression of the disease increased intrahepatic and extrahepatic biliary prominence with the common bile duct 9 mm. 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  Common bile or common hepatic duct measures up to 1 cm in caliber I have discussed with patient regarding ERCP to attempt biliary decompression with CBD stent placement which is a palliative measure only, discussed with her risks of acute pancreatitis, bleeding and infection as well She would like to undergo this procedure N.p.o. effective a.m. tomorrow Will tentatively plan ERCP tomorrow by Dr. Servando Snare Patient is also scheduled to  undergo catheter placement for palliative relief from her ascites tomorrow  I have discussed alternative options, risks & benefits,  which include, but are not limited to, bleeding, infection, perforation,respiratory complication & drug reaction.  The patient agrees with this plan & written consent will be obtained.      LOS: 5 days   Carlo Lorson 07/10/2023, 5:07 PM

## 2023-07-10 NOTE — Progress Notes (Signed)
Palliative Medicine [facility] at [facility] [phone] [phone]) [phone]   Name: Ariana Wilson Date: 07/10/2023 MRN: [id number]  DOB: 16-Jul-1961  Patient Care Team: Danella Penton, MD as PCP - General (Internal Medicine) Benita Gutter, RN as Oncology Nurse Navigator Creig Hines, MD as Consulting Physician (Oncology)    REASON FOR CONSULTATION: Ariana Wilson is a 62 y.o. 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  She was diagnosed with cancer in June 2023.  Patient has had disease progression and poor tolerance of previous chemotherapy.  Palliative care is consulted to address goals.  SOCIAL HISTORY:     reports that she has never smoked. She has never used smokeless tobacco. She reports that she does not drink alcohol and does not use drugs.  Patient is married and lives at home with her husband.  She is retired Runner, broadcasting/film/video.   ADVANCE DIRECTIVES:  On file  CODE STATUS: DNR  PAST MEDICAL HISTORY: Past Medical History:  Diagnosis Date   Anemia    Arthritis    Atrial fibrillation (HCC)    B12 deficiency    Cancer (HCC)    Dysrhythmia/history A. Fib    Hyperlipidemia    Hypothyroidism    Menopause    Osteoarthritis    Palpitations    Hx of, 25 years ago   Vertigo     PAST SURGICAL HISTORY:  Past Surgical History:  Procedure Laterality Date   BILATERAL SALPINGOOPHORECTOMY  2008   DILATION AND CURETTAGE OF UTERUS  08/24/2006   ESOPHAGOGASTRODUODENOSCOPY (EGD) WITH PROPOFOL N/A 03/01/2022   Procedure: ESOPHAGOGASTRODUODENOSCOPY (EGD) WITH PROPOFOL;  Surgeon: Regis Bill, MD;  Location: [facility] ENDOSCOPY;  Service: Endoscopy;  Laterality: N/A;   ESOPHAGOGASTRODUODENOSCOPY (EGD) WITH PROPOFOL N/A 05/04/2022   Procedure: ESOPHAGOGASTRODUODENOSCOPY (EGD) WITH PROPOFOL;  Surgeon: [name], MD;  Location[facility] ENDOSCOPY;  Service: Gastroenterology;   Laterality: N/A;   ESOPHAGOGASTRODUODENOSCOPY (EGD) WITH PROPOFOL N/A 06/01/2023   Procedure: ESOPHAGOGASTRODUODENOSCOPY (EGD) WITH PROPOFOL;  Surgeon: [name], MD;  Location: [facility] ENDOSCOPY;  Service: Gastroenterology;  Laterality: N/A;   HEMOSTASIS CONTROL  06/01/2023   Procedure: HEMOSTASIS CONTROL;  Surgeon: [name], MD;  Location: [facility] ENDOSCOPY;  Service: Gastroenterology;;   incision tendon sheath for trigger finger Right    ring finger   LAPAROSCOPIC SUPRACERVICAL HYSTERECTOMY  2008   Dr. Arvil Chaco; with BSO   NOSE SURGERY  03/2008   PORTA CATH INSERTION N/A 03/21/2022   Procedure: PORTA CATH INSERTION;  Surgeon: Annice Needy, MD;  Location: [facility] INVASIVE CV LAB;  Service: Cardiovascular;  Laterality: N/A;    HEMATOLOGY/ONCOLOGY HISTORY:  Oncology History  Primary malignant neuroendocrine tumor of esophagus (HCC)  03/20/2022 Initial Diagnosis   Primary malignant neuroendocrine neoplasm of esophagus (HCC)   03/20/2022 Cancer Staging   Staging form: Esophagus - Other Histologies, AJCC 8th Edition - Clinical stage from 03/20/2022: cTX, cN1, cM1, G3 - Signed by Creig Hines, MD on 03/20/2022 Histopathologic type: Large cell neuroendocrine carcinoma Histologic grading system: 3 grade system   03/30/2022 - 04/29/2022 Chemotherapy   Patient is on Treatment Plan : NEUROENDOCRINE GE junctionCisplatin/Tecentriq D1 + Etoposide D1-3 q21d     05/17/2022 - 09/08/2022 Chemotherapy   Patient is on Treatment Plan : GASTROESOPHAGEAL FOLFOX q14d      06/06/2023 -  Chemotherapy   Patient is on Treatment Plan : GE junction adenocarcinoma FOLFIRI q14d     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  MEDICATIONS:  Current  Facility-Administered Medications  Medication Dose Route Frequency Provider Last Rate Last Admin   acetaminophen (TYLENOL) tablet 650 mg  650 mg Oral Q6H PRN Andris Baumann, MD   650 mg at 07/06/23 0214   Or   acetaminophen (TYLENOL) suppository 650 mg  650 mg Rectal Q6H PRN Andris Baumann, MD       albumin human 25 % solution 12.5 g  12.5 g Intravenous Once Alford Highland, MD       Chlorhexidine Gluconate Cloth 2 % PADS 6 each  6 each Topical Daily Alford Highland, MD   6 each at 07/09/23 0930   dicyclomine (BENTYL) tablet 20 mg  20 mg Oral BID Andris Baumann, MD   20 mg at 07/10/23 0948   diphenoxylate-atropine (LOMOTIL) 2.5-0.025 MG per tablet 2 tablet  2 tablet Oral Q6H PRN Andris Baumann, MD       feeding supplement (ENSURE ENLIVE / ENSURE PLUS) liquid 237 mL  237 mL Oral TID BM Alford Highland, MD   237 mL at 07/08/23 0838   fentaNYL (DURAGESIC) 25 MCG/HR 1 patch  1 patch Transdermal Q72H Andris Baumann, MD   1 patch at 07/09/23 1442   flecainide (TAMBOCOR) tablet 50 mg  50 mg Oral Q breakfast Alford Highland, MD   50 mg at 07/10/23 0806   HYDROcodone-acetaminophen (NORCO/VICODIN) 5-325 MG per tablet 1-2 tablet  1-2 tablet Oral Q4H PRN Andris Baumann, MD       lactulose (CHRONULAC) 10 GM/15ML solution 10-20 g  10-20 g Oral TID PRN Andris Baumann, MD       metoprolol tartrate (LOPRESSOR) tablet 12.5 mg  12.5 mg Oral BID Alford Highland, MD   12.5 mg at 07/09/23 2105   midodrine (PROAMATINE) tablet 10 mg  10 mg Oral TID WC Wieting, Richard, MD       morphine (PF) 2 MG/ML injection 2 mg  2 mg Intravenous Q2H PRN Andris Baumann, MD       multivitamin with minerals tablet 1 tablet  1 tablet Oral Daily Wieting, Richard, MD       OLANZapine (ZYPREXA) tablet 10 mg  10 mg Oral QHS Lindajo Royal V, MD   10 mg at 07/09/23 2107   ondansetron (ZOFRAN) tablet 4 mg  4 mg Oral Q6H PRN Andris Baumann, MD       Or   ondansetron [facility]) injection 4 mg  4 mg Intravenous Q6H PRN Andris Baumann, MD   4 mg at 07/09/23 2008   oxyCODONE (Oxy IR/ROXICODONE) immediate release tablet 5 mg  5 mg Oral Q4H PRN Andris Baumann, MD       traZODone (DESYREL) tablet 50 mg  50 mg Oral QHS PRN Andris Baumann, MD   50 mg at 07/09/23 2106   Facility-Administered Medications Ordered in Other Encounters  Medication Dose Route Frequency Provider Last Rate Last Admin   heparin lock flush 100 UNIT/ML injection             VITAL SIGNS: BP (!) 82/58 (BP Location: Left Arm)   Pulse (!) 113   Temp 98.7 F (37.1 C)  Resp 18   Ht 5' (1.524 m)   Wt 149 lb (67.6 kg)   SpO2 96%   BMI 29.10 kg/m  Filed Weights   07/05/23 1526  Weight: 149 lb (67.6 kg)    Estimated body mass index is 29.1 kg/m as calculated from the following:   Height as of this encounter: 5' (1.524 m).   Weight as of this encounter: 149 lb (67.6 kg).  LABS: CBC:    Component Value Date/Time   WBC 1.3 (LL) 07/08/2023 0545   HGB 8.6 (L) 07/08/2023 0545   HGB 9.6 (L) 07/04/2023 1102   HCT 23.9 (L) 07/08/2023 0545   PLT 74 (L) 07/08/2023 0545   PLT 79 (L) 07/04/2023 1102   MCV 85.1 07/08/2023 0545   NEUTROABS 0.5 (L) 07/05/2023 2127   LYMPHSABS 1.0 07/05/2023 2127   MONOABS 0.0 (L) 07/05/2023 2127   EOSABS 0.0 07/05/2023 2127   BASOSABS 0.0 07/05/2023 2127   Comprehensive Metabolic Panel:    Component Value Date/Time   NA 128 (L) 07/08/2023 0545   K 4.0 07/08/2023 0545   CL 98 07/08/2023 0545   CO2 22 07/08/2023 0545   BUN 30 (H) 07/08/2023 0545   CREATININE 0.42 (L) 07/08/2023 0545   CREATININE 0.86 07/04/2023 1102   GLUCOSE 98 07/08/2023 0545   CALCIUM 7.7 (L) 07/08/2023 0545   AST 134 (H) 07/07/2023 0629   AST 203 (HH) 06/27/2023 1013   ALT 182 (H) 07/07/2023 0629   ALT 150 (H) 06/27/2023 1013   ALKPHOS 768 (H) 07/07/2023 0629   BILITOT 4.5 (H) 07/07/2023 0629   BILITOT 1.1 06/27/2023 1013   PROT 5.1 (L) 07/07/2023 0629   PROT 6.6 03/06/2015 0910   ALBUMIN 2.6 (L) 07/07/2023 0629   ALBUMIN 4.6  03/06/2015 0910    RADIOGRAPHIC STUDIES: MR ABDOMEN MRCP W WO CONTAST  Result Date: 07/07/2023 CLINICAL DATA:  Five days of abdominal pain and distention, worsening constipation, history of metastatic esophageal cancer EXAM: MRI ABDOMEN WITHOUT AND WITH CONTRAST (INCLUDING MRCP) TECHNIQUE: Multiplanar multisequence MR imaging of the abdomen was performed both before and after the administration of intravenous contrast. Heavily T2-weighted images of the biliary and pancreatic ducts were obtained, and three-dimensional MRCP images were rendered by post processing. CONTRAST:  7mL GADAVIST GADOBUTROL 1 MMOL/ML IV SOLN COMPARISON:  CT abdomen pelvis, 07/06/2023, 07/01/2023 FINDINGS: Lower chest: No acute abnormality. Hepatobiliary: T2 hyperintense, diffusion restricting, slightly hyperenhancing liver lesions of the posterior right lobe of the liver, largest in hepatic segment VII measuring 1.4 x 0.8 cm (series 4, image 15), additional lesion superiorly in hepatic segment VII measuring 0.5 cm (series 4, image 14). Gallstones. No gallbladder wall thickening. Intra and extrahepatic biliary ductal dilatation, with abrupt obstruction of the common bile duct at the margin of the pancreatic head (series 3, image 17). Common bile or common hepatic duct measures up to 1.0 cm in caliber. Pancreas: There is again bulky gastrohepatic ligament lymphadenopathy and soft tissue which likely infiltrates the adjacent superior pancreatic head and neck (series 9, image 19). No pancreatic ductal dilatation or surrounding inflammatory changes. Spleen: Normal in size without significant abnormality. Adrenals/Urinary Tract: Unchanged enhancing left adrenal mass measuring 4.5 x 2.8 cm (series 23, image 42). Kidneys are normal, without renal calculi, solid lesion, or hydronephrosis. Stomach/Bowel: Unchanged infiltrative gastroesophageal junction and gastric fundus mass (series 23, image 31). No evidence of other bowel wall thickening,  distention, or inflammatory changes. Vascular/Lymphatic: The portal confluence is sharply narrowed and possibly partially occluded by mass  effect of adjacent lymphadenopathy (series 23, image 46). Mesenteric varices about the ventral abdominal wall (series 23, image 65). Unchanged bulky, matted retroperitoneal retrocrural, and porta hepatis lymphadenopathy (series 23, image 49). Other: No abdominal wall hernia or abnormality. Large volume ascites throughout the abdomen and pelvis, similar to prior examination. Diffuse peritoneal thickening and enhancement. Musculoskeletal: No acute or significant osseous findings. IMPRESSION: 1. Unchanged infiltrative gastroesophageal junction and gastric fundus mass. 2. Unchanged bulky, matted retroperitoneal, retrocrural, and porta hepatis lymphadenopathy. This appears to extend and directly involve the adjacent pancreatic head and neck, with obstruction of the common bile duct at the margin of the pancreas. 3. Unchanged enhancing left adrenal mass, consistent with metastatic disease. 4. Two small liver lesions of the posterior right lobe of the liver, consistent with hepatic metastatic disease. 5. The portal confluence is sharply narrowed and possibly partially occluded by mass effect of adjacent lymphadenopathy. Mesenteric varices about the ventral abdominal wall. 6. Large volume ascites throughout the abdomen and pelvis, similar to prior examination. Diffuse peritoneal thickening and enhancement, consistent with peritoneal metastatic disease. 7. Cholelithiasis. Electronically Signed   By: Jearld Lesch M.D.   On: 07/07/2023 19:02   US Paracentesis  Result Date: 07/06/2023 INDICATION: Paracentesis for peritoneal metastasis with large volume free fluid. EXAM: ULTRASOUND GUIDED Therapeutic and Diagnostic  PARACENTESIS MEDICATIONS: None. COMPLICATIONS: None immediate. PROCEDURE: Informed written consent was obtained from the patient after a discussion of the risks, benefits  and alternatives to treatment. A timeout was performed prior to the initiation of the procedure. Initial ultrasound scanning demonstrates a large amount of ascites within the right lower abdominal quadrant. The right lower abdomen was prepped and draped in the usual sterile fashion. 1% lidocaine was used for local anesthesia. Following this, a 19 gauge, 7-cm, Yueh catheter was introduced. An ultrasound image was saved for documentation purposes. The paracentesis was performed. The catheter was removed and a dressing was applied. The patient tolerated the procedure well without immediate post procedural complication. FINDINGS: A total of approximately 3.45 liters of clear yellow fluid was removed. Samples were sent to the laboratory as requested by the clinical team. IMPRESSION: Successful ultrasound-guided paracentesis yielding 3.45 liters of peritoneal fluid. Performed by Ardith Dark NP-C Electronically Signed   By: Irish Lack M.D.   On: 07/06/2023 16:16   CT ABDOMEN PELVIS WO CONTRAST  Result Date: 07/06/2023 CLINICAL DATA:  Abdominal pain and flank pain. RIGHT lower quadrant pain. Ongoing chemotherapy. Esophageal cancer. * Tracking Code: BO * is EXAM: CT ABDOMEN AND PELVIS WITHOUT CONTRAST TECHNIQUE: Multidetector CT imaging of the abdomen and pelvis was performed following the standard protocol without IV contrast. RADIATION DOSE REDUCTION: This exam was performed according to the departmental dose-optimization program which includes automated exposure control, adjustment of the mA and/or kV according to patient size and/or use of iterative reconstruction technique. COMPARISON:  CT 07/01/2019 FINDINGS: Lower chest: Small LEFT pleural effusion Hepatobiliary: No focal hepatic lesion identified on noncontrast exam gallbladder collapsed. No biliary duct dilatation evident. Pancreas: No pancreatic inflammation identified. Spleen: Normal spleen Adrenals/urinary tract: Probable LEFT adrenal gland metastasis  unchanged measuring 2.7 x 4.1 cm. No nephrolithiasis or ureterolithiasis. No bladder calculi. Stomach/Bowel: Mass in the gastric cardiac region and gastrohepatic lumen is less well-defined on noncontrast CT. No small bowel dilatation. Normal colon. Vascular/Lymphatic: Multiple periaortic lymph nodes in the upper abdomen unchanged from prior. Reproductive: Unremarkable Other: Large volume intraperitoneal free fluid unchanged from comparison exam. Peritoneal implant in ventral abdomen not changed at 18 mm (image 30/2)  Musculoskeletal: No aggressive osseous lesion. IMPRESSION: 1. No nephrolithiasis ureterolithiasis or obstructive uropathy. 2. No significant interval change from CT 07/01/2023. No IV contrast 3. Large volume gastric mass and gastrohepatic ligament adenopathy unchanged from prior. 4. Peritoneal metastasis and large volume intraperitoneal free fluid not changed. 5. LEFT adrenal gland metastasis unchanged. 6. Small RIGHT pleural effusion. Electronically Signed   By: Genevive Bi M.D.   On: 07/06/2023 08:32   DG Chest Portable 1 View  Result Date: 07/05/2023 CLINICAL DATA:  Weakness abnormal labs from the cancer center. Pt states labs drawn yesterday at [facility] and also states she had a liter of fluid PTA from cancer center. Pt states taking ABX for a UTI, pt endorses watery type diarrhea. EXAM: PORTABLE CHEST 1 VIEW COMPARISON:  Chest x-ray 07/01/2022, CT abdomen pelvis 07/01/2023, CT chest 08/01/2022 FINDINGS: Axis right chest wall dialysis catheter with tip overlying the superior cavoatrial junction. The heart and mediastinal contours are unchanged. Aortic calcification. No focal consolidation. No pulmonary edema. Left base atelectasis. Trace to small volume left pleural effusion. No pneumothorax. No acute osseous abnormality. IMPRESSION: *Trace to small volume left pleural effusion. * Aortic Atherosclerosis (ICD10-I70.0). Electronically Signed   By: Tish Frederickson M.D.   On: 07/05/2023 22:19    US Paracentesis  Result Date: 07/05/2023 INDICATION: 62 year old female. History of adenocarcinoma of the GE junction. Found to have ascites. Request is for therapeutic and diagnostic paracentesis EXAM: ULTRASOUND GUIDED THERAPEUTIC AND DIAGNOSTIC PARACENTESIS MEDICATIONS: Lidocaine 1% 10 mL COMPLICATIONS: None immediate. PROCEDURE: Informed written consent was obtained from the patient after a discussion of the risks, benefits and alternatives to treatment. A timeout was performed prior to the initiation of the procedure. Initial ultrasound scanning demonstrates a small amount of ascites within the right lower abdominal quadrant. The right lower abdomen was prepped and draped in the usual sterile fashion. 1% lidocaine was used for local anesthesia. Following this, a 19 gauge, 10-cm, Yueh catheter was introduced. An ultrasound image was saved for documentation purposes. The paracentesis was performed. The catheter was removed and a dressing was applied. The patient tolerated the procedure well without immediate post procedural complication. FINDINGS: A total of approximately 2.3 L of cloudy yellow fluid was removed. Samples were sent to the laboratory as requested by the clinical team. IMPRESSION: Successful ultrasound-guided therapeutic and diagnostic paracentesis yielding 2.3 liters of peritoneal fluid. Performed by Anders Grant NP Electronically Signed   By: Olive Bass M.D.   On: 07/05/2023 08:42   CT ABDOMEN PELVIS W CONTRAST  Result Date: 07/01/2023 CLINICAL DATA:  Cancer patient being treated with chemotherapy for GE junction neoplasm, presents with loss of appetite and increasing abdominal distension, abdominal pain and worsening constipation. 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 EXAM: CT ABDOMEN AND PELVIS  WITH CONTRAST TECHNIQUE: Multidetector CT imaging of the abdomen and pelvis was performed using the standard protocol following bolus administration of intravenous contrast. RADIATION DOSE REDUCTION: This exam was performed according to the departmental dose-optimization program which includes automated exposure control, adjustment of the mA and/or kV according to patient size and/or use of iterative reconstruction technique. CONTRAST:  OMNIPAQUE IOHEXOL 300 MG/ML  SOLN COMPARISON:  There are multiple prior CTs. The 2 most recent do not have available reports as they were performed at a [facility] facility. This includes a CT of the abdomen with contrast 05/18/2023 and CT  abdomen and pelvis with IV contrast 01/12/2023. The most recent exam with an attached report is CT chest, abdomen and pelvis CT with contrast 08/02/2023. the patient's last PET-CT was 09/15/2022 and also does not have an attached report. FINDINGS: Lower chest: There is subsegmental atelectasis in both bases. There is a small left layering pleural effusion which has increased in volume since 05/18/2023. No right pleural effusion. A port catheter again terminates in the upper right atrium. The cardiac size is normal. Compared with 05/18/2023 there is increasing fold thickening in the distal esophagus and proximal stomach, increased luminal effacement. There is a periesophageal lymph node on 2:17 which is larger than previously now 1.6 x 1 cm, previously 1.1 x 0.4 cm (2:17). Hepatobiliary: There is a new 1.6 cm hypervascular lesion posteriorly in segment 7 on 2:23 concerning for metastasis. Another measuring 8 mm is noted in segment 4A on 2:18 and is also suspicious. The liver is mildly steatotic. No other mass enhancement is seen. There is cholelithiasis with the gallbladder contracted. There is increased intrahepatic and extrahepatic biliary prominence with the common bile duct now 9 mm. Etiology may be abutting gastrohepatic ligament and periportal  adenopathy in the area contributing to this. Pancreas: No primary mass or ductal dilatation, but there is gastrohepatic ligament adenopathy just above the pancreatic neck which is inseparable from the pancreas in may potentially infiltrate the pancreas. Spleen: Somewhat heterogeneous enhancement in the medial spleen initially but uniform enhancement on the delayed images, probably due to perfusional differences or bolus timing. The spleen is not enlarged. Adrenals/Urinary Tract: There is a new solid 1.8 x 1.3 cm right adrenal genu mass almost certainly metastatic. The left adrenal gland is not well seen due to adjacent bulky left para-aortic chain adenopathy and could also be tumor-involved. The kidneys concentrate and excrete contrast symmetrically with no mass enhancement. There is increased trace bilateral perinephric fluid. No hydronephrosis, urinary stones or bladder thickening. Stomach/Bowel: As above there is increased masslike fold thickening at the GE junction extending into the proximal stomach, but there is also increased moderate to severe irregular gastric antral wall thickening, which could be inflammatory or due to additional infiltrating disease. Furthermore the stomach is increasingly fluid distended proximal to this. No small bowel obstruction is seen. An appendix is not seen in this patient. Large volume of retained stool is increased over priors. No bowel wall thickening. Vascular/Lymphatic: Multifocal bulky adenopathy. There is a conglomerate gastrohepatic ligament mass measuring 8.1 x 5.8 cm on 2:32, previously 7.5 x 4.2 cm. This mass completely encases, and narrows the main hepatic artery and splenic artery and encases and severely narrows the proximal hepatic portal vein. There are additional portacaval lymph nodes, largest of these is 2.2 x 1.6 cm on 2:34, slightly larger than previously. There is worsened retroperitoneal lymphadenopathy. Bulky left periaortic chain nodes are noted with the  largest of these measuring 3.8 x 2.7 cm on 2:31, on the last CT was 3 x 1.9 cm, with multiple others in this chain abutting and narrowing the left renal vein. Multiple enlarged aortocaval lymph nodes are also noted, for example on 2:39 measuring 2.5 x 1.3 cm, previously 2.1 x 1.2 cm. Some of these lymph nodes partially efface the IVC lumen. No pelvic or bulky mesenteric adenopathy is seen. Reproductive: Status post hysterectomy. No adnexal masses. Other: Since 05/18/2023, large-volume abdominal and pelvic ascites has developed in the abdomen and pelvis with scattered fluid in the mesenteric folds. There is no free air, localizing collection or hemorrhage.  There is a new mesenteric lymph node in the transverse mesocolon measuring 2.0 x 1.4 cm on 2:45. Scattered omental varices. No hernias. Increased mild body wall anasarca and mesenteric congestive changes. Musculoskeletal: No regional bone metastasis is seen. IMPRESSION: 1. Increasing masslike fold thickening at the GE junction extending into the proximal stomach, with increased moderate to severe irregular gastric antral wall thickening, the latter which could be inflammatory or due to additional infiltrating disease. 2. Increasing fluid distended stomach proximal to the antral fold thickening. 3. Increased size of bulky gastrohepatic ligament adenopathy which completely encases and narrows the main hepatic artery and splenic artery, and encases and severely narrows the proximal hepatic portal vein. 4. Increased size of a pre-esophageal lymph node, and increased size of portacaval and aortocaval lymph nodes. 5. 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 6. Two new hypervascular liver lesions concerning for metastases. 7. New large-volume abdominal and pelvic ascites with increased omental varices. 8. Increased intrahepatic and extrahepatic biliary dilatation, etiology may be abutting gastrohepatic ligament and periportal adenopathy in the  area. 9. Increased mild body wall anasarca and mesenteric congestive changes. 10. Increased small left pleural effusion. 11. Constipation. 12. The left adrenal gland is not well seen due to adjacent bulky left para-aortic chain adenopathy and could also be tumor-involved. Electronically Signed   By: Almira Bar M.D.   On: 07/01/2023 21:50   DG Abd 2 Views  Result Date: 06/30/2023 CLINICAL DATA:  Abdominal distension, rule out obstruction. EXAM: ABDOMEN - 2 VIEW COMPARISON:  CT 05/18/2023 FINDINGS: No free intra-abdominal air. No small bowel distention or evidence of obstruction. There is a large volume of stool throughout the colon. Small volume of stool in the rectum. Multiple pelvic phleboliths. Gallstones on prior CT are not well seen. Small left pleural effusion was present on prior abdominal CT. IMPRESSION: Large volume of stool throughout the colon suggesting constipation. No evidence of bowel obstruction. Electronically Signed   By: Narda Rutherford M.D.   On: 06/30/2023 15:04    PERFORMANCE STATUS (ECOG) : 2 - Symptomatic, <50% confined to bed  Review of Systems Unless otherwise noted, a complete review of systems is negative.  Physical Exam General: NAD Cardiovascular: regular rate and rhythm Pulmonary: clear ant fields Abdomen: soft, nontender, + bowel sounds GU: no suprapubic tenderness Extremities: no edema, no joint deformities Skin: no rashes Neurological: Weakness but otherwise nonfocal  IMPRESSION: Follow-up visit.  Met with patient and husband.  Plans are being arranged for hospice involvement, most likely at home.  However, husband is still interested in exploring hospice facilities.  Will speak with TOC.  Discussed results of MRCP.  Patient is interested in exploring option for biliary stenting versus percutaneous drain.  Will speak with GI team.  Also, readdressed option of Tenckhoff catheter given recurrent ascites.  Patient now interested in pursuing peritoneal  drain to allow hospice management of ascites.  PLAN: -Best support of care -Referral to hospice  Case and plan discussed with Dr. Smith Robert and Dr. Renae Gloss   Time Total: 25 minutes  Visit consisted of counseling and education dealing with the complex and emotionally intense issues of symptom management and palliative care in the setting of serious and potentially life-threatening illness.Greater than 50%  of this time was spent counseling and coordinating care related to the above assessment and plan.  Signed by: Laurette Schimke, PhD, NP-C

## 2023-07-11 ENCOUNTER — Inpatient Hospital Stay: Payer: BC Managed Care – PPO | Admitting: Anesthesiology

## 2023-07-11 ENCOUNTER — Other Ambulatory Visit: Payer: Self-pay | Admitting: Interventional Radiology

## 2023-07-11 ENCOUNTER — Inpatient Hospital Stay: Payer: BC Managed Care – PPO | Admitting: Radiology

## 2023-07-11 ENCOUNTER — Encounter: Payer: Self-pay | Admitting: Internal Medicine

## 2023-07-11 DIAGNOSIS — E871 Hypo-osmolality and hyponatremia: Secondary | ICD-10-CM | POA: Diagnosis not present

## 2023-07-11 DIAGNOSIS — C7A8 Other malignant neuroendocrine tumors: Secondary | ICD-10-CM | POA: Diagnosis not present

## 2023-07-11 DIAGNOSIS — R180 Malignant ascites: Secondary | ICD-10-CM | POA: Diagnosis not present

## 2023-07-11 DIAGNOSIS — I482 Chronic atrial fibrillation, unspecified: Secondary | ICD-10-CM

## 2023-07-11 DIAGNOSIS — R932 Abnormal findings on diagnostic imaging of liver and biliary tract: Secondary | ICD-10-CM | POA: Diagnosis not present

## 2023-07-11 HISTORY — PX: IR PERC TUN PERIT CATH WO PORT S&I /IMAG: IMG2327

## 2023-07-11 LAB — CBC
HCT: 19.9 % — ABNORMAL LOW (ref 36.0–46.0)
Hemoglobin: 6.7 g/dL — ABNORMAL LOW (ref 12.0–15.0)
MCH: 30.6 pg (ref 26.0–34.0)
MCHC: 33.7 g/dL (ref 30.0–36.0)
MCV: 90.9 fL (ref 80.0–100.0)
Platelets: 143 10*3/uL — ABNORMAL LOW (ref 150–400)
RBC: 2.19 MIL/uL — ABNORMAL LOW (ref 3.87–5.11)
RDW: 19.5 % — ABNORMAL HIGH (ref 11.5–15.5)
WBC: 2.5 10*3/uL — ABNORMAL LOW (ref 4.0–10.5)
nRBC: 1.2 % — ABNORMAL HIGH (ref 0.0–0.2)

## 2023-07-11 LAB — GLUCOSE, CAPILLARY: Glucose-Capillary: 94 mg/dL (ref 70–99)

## 2023-07-11 LAB — SODIUM: Sodium: 122 mmol/L — ABNORMAL LOW (ref 135–145)

## 2023-07-11 LAB — HEPATIC FUNCTION PANEL
ALT: 165 U/L — ABNORMAL HIGH (ref 0–44)
AST: 126 U/L — ABNORMAL HIGH (ref 15–41)
Albumin: 1.9 g/dL — ABNORMAL LOW (ref 3.5–5.0)
Alkaline Phosphatase: 890 U/L — ABNORMAL HIGH (ref 38–126)
Bilirubin, Direct: 6.1 mg/dL — ABNORMAL HIGH (ref 0.0–0.2)
Indirect Bilirubin: 3.4 mg/dL — ABNORMAL HIGH (ref 0.3–0.9)
Total Bilirubin: 9.5 mg/dL — ABNORMAL HIGH (ref 0.3–1.2)
Total Protein: 4.7 g/dL — ABNORMAL LOW (ref 6.5–8.1)

## 2023-07-11 LAB — BASIC METABOLIC PANEL
Anion gap: 7 (ref 5–15)
Anion gap: 7 (ref 5–15)
BUN: 28 mg/dL — ABNORMAL HIGH (ref 8–23)
BUN: 31 mg/dL — ABNORMAL HIGH (ref 8–23)
CO2: 23 mmol/L (ref 22–32)
CO2: 24 mmol/L (ref 22–32)
Calcium: 7.5 mg/dL — ABNORMAL LOW (ref 8.9–10.3)
Calcium: 7.6 mg/dL — ABNORMAL LOW (ref 8.9–10.3)
Chloride: 91 mmol/L — ABNORMAL LOW (ref 98–111)
Chloride: 92 mmol/L — ABNORMAL LOW (ref 98–111)
Creatinine, Ser: 0.87 mg/dL (ref 0.44–1.00)
Creatinine, Ser: 0.9 mg/dL (ref 0.44–1.00)
GFR, Estimated: >60 mL/min (ref >60)
GFR, Estimated: >60 mL/min (ref >60)
Glucose, Bld: 87 mg/dL (ref 70–99)
Glucose, Bld: 90 mg/dL (ref 70–99)
Potassium: 4.1 mmol/L (ref 3.5–5.1)
Potassium: 4.2 mmol/L (ref 3.5–5.1)
Sodium: 121 mmol/L — ABNORMAL LOW (ref 135–145)
Sodium: 123 mmol/L — ABNORMAL LOW (ref 135–145)

## 2023-07-11 LAB — PREPARE RBC (CROSSMATCH)

## 2023-07-11 LAB — PATHOLOGIST SMEAR REVIEW

## 2023-07-11 MED ORDER — CEFAZOLIN SODIUM-DEXTROSE 2-4 GM/100ML-% IV SOLN: 2.0000 g | Freq: Once | INTRAVENOUS | Status: DC

## 2023-07-11 MED ORDER — CEFAZOLIN SODIUM-DEXTROSE 2-4 GM/100ML-% IV SOLN
INTRAVENOUS | Status: AC
  Filled 2023-07-11: qty 100

## 2023-07-11 MED ORDER — MIDAZOLAM HCL 2 MG/2ML IJ SOLN
INTRAMUSCULAR | Status: AC
  Filled 2023-07-11: qty 2

## 2023-07-11 MED ORDER — FENTANYL CITRATE (PF) 100 MCG/2ML IJ SOLN
INTRAMUSCULAR | Status: AC
  Filled 2023-07-11: qty 2

## 2023-07-11 MED ORDER — MIDAZOLAM HCL 5 MG/5ML IJ SOLN
INTRAMUSCULAR | Status: AC | PRN
Start: 2023-07-11 — End: 2023-07-11
  Administered 2023-07-11 (×2): .5 mg via INTRAVENOUS

## 2023-07-11 MED ORDER — CEFAZOLIN SODIUM-DEXTROSE 2-4 GM/100ML-% IV SOLN
INTRAVENOUS | Status: AC | PRN
  Administered 2023-07-11: 2 g via INTRAVENOUS

## 2023-07-11 MED ORDER — LIDOCAINE 1 % OPTIME INJ - NO CHARGE
12.0000 mL | Freq: Once | INTRAMUSCULAR | Status: AC
  Administered 2023-07-11: 12 mL via INTRADERMAL

## 2023-07-11 MED ORDER — ACETAMINOPHEN 325 MG PO TABS
650.0000 mg | ORAL_TABLET | Freq: Once | ORAL | Status: AC
  Administered 2023-07-11: 650 mg via ORAL
  Filled 2023-07-11: qty 2

## 2023-07-11 MED ORDER — SODIUM CHLORIDE 0.9% IV SOLUTION: Freq: Once | INTRAVENOUS | Status: AC

## 2023-07-11 MED ORDER — TOLVAPTAN 15 MG PO TABS
15.0000 mg | ORAL_TABLET | Freq: Once | ORAL | Status: AC
  Administered 2023-07-11: 15 mg via ORAL
  Filled 2023-07-11: qty 1

## 2023-07-11 MED ORDER — FENTANYL CITRATE (PF) 100 MCG/2ML IJ SOLN
INTRAMUSCULAR | Status: AC | PRN
  Administered 2023-07-11: 50 ug via INTRAVENOUS

## 2023-07-11 NOTE — Anesthesia Preprocedure Evaluation (Signed)
Anesthesia Evaluation  Patient identified by MRN, date of birth, ID band Patient awake    Reviewed: Allergy & Precautions, NPO status , Patient's Chart, lab work & pertinent test results  History of Anesthesia Complications Negative for: history of anesthetic complications  Airway Mallampati: III  TM Distance: <3 FB Neck ROM: full    Dental  (+) Chipped   Pulmonary neg pulmonary ROS, neg shortness of breath   Pulmonary exam normal        Cardiovascular Exercise Tolerance: Good + dysrhythmias Atrial Fibrillation      Neuro/Psych negative neurological ROS  negative psych ROS   GI/Hepatic negative GI ROS, Neg liver ROS,,,  Endo/Other  Hypothyroidism    Renal/GU negative Renal ROS  negative genitourinary   Musculoskeletal   Abdominal   Peds  Hematology negative hematology ROS (+)   Anesthesia Other Findings Patient reports baseline systolic BP is 90s  Patient is NPO appropriate and reports no nausea or vomiting today.  Past Medical History: No date: Anemia No date: Arthritis No date: Atrial fibrillation (HCC) No date: B12 deficiency No date: Cancer (HCC) No date: Dysrhythmia/history A. Fib No date: Hyperlipidemia No date: Hypothyroidism No date: Menopause No date: Osteoarthritis No date: Palpitations     Comment:  Hx of, 25 years ago No date: Vertigo  Past Surgical History: No date: ABDOMINAL HYSTERECTOMY 2008: BILATERAL SALPINGOOPHORECTOMY 08/24/2006: DILATION AND CURETTAGE OF UTERUS 03/01/2022: ESOPHAGOGASTRODUODENOSCOPY (EGD) WITH PROPOFOL; N/A     Comment:  Procedure: ESOPHAGOGASTRODUODENOSCOPY (EGD) WITH               PROPOFOL;  Surgeon: Regis Bill, MD;  Location:               [facility] ENDOSCOPY;  Service: Endoscopy;  Laterality: N/A; 05/04/2022: ESOPHAGOGASTRODUODENOSCOPY (EGD) WITH PROPOFOL; N/A     Comment:  Procedure: ESOPHAGOGASTRODUODENOSCOPY (EGD) WITH               PROPOFOL;   Surgeon: [name], MD;  Location: [facility]               ENDOSCOPY;  Service: Gastroenterology;  Laterality: N/A; 06/01/2023: ESOPHAGOGASTRODUODENOSCOPY (EGD) WITH PROPOFOL; N/A     Comment:  Procedure: ESOPHAGOGASTRODUODENOSCOPY (EGD) WITH               PROPOFOL;  Surgeon: [name], MD;  Location: [facility]               ENDOSCOPY;  Service: Gastroenterology;  Laterality: N/A; 06/01/2023: HEMOSTASIS CONTROL     Comment:  Procedure: HEMOSTASIS CONTROL;  Surgeon: [name],               MD;  Location: [facility] ENDOSCOPY;  Service:               Gastroenterology;; No date: incision tendon sheath for trigger finger; Right     Comment:  ring finger 2008: LAPAROSCOPIC SUPRACERVICAL HYSTERECTOMY     Comment:  Dr. Arvil Chaco; with BSO 03/2008: NOSE SURGERY 03/21/2022: PORTA CATH INSERTION; N/A     Comment:  Procedure: PORTA CATH INSERTION;  Surgeon: Annice Needy,              MD;  Location: [facility] INVASIVE CV LAB;  Service:               Cardiovascular;  Laterality: N/A;  BMI    Body Mass Index: 29.10 kg/m      Reproductive/Obstetrics negative OB ROS  Anesthesia Physical Anesthesia Plan  ASA: 3  Anesthesia Plan: General   Post-op Pain Management:    Induction: Intravenous  PONV Risk Score and Plan: Propofol infusion and TIVA  Airway Management Planned: Natural Airway and Nasal Cannula  Additional Equipment:   Intra-op Plan:   Post-operative Plan:   Informed Consent: I have reviewed the patients History and Physical, chart, labs and discussed the procedure including the risks, benefits and alternatives for the proposed anesthesia with the patient or authorized representative who has indicated his/her understanding and acceptance.   Patient has DNR.  Discussed DNR with patient, Discussed DNR with power of attorney and Continue DNR.   Dental Advisory Given  Plan Discussed with: Anesthesiologist, CRNA and Surgeon  Anesthesia  Plan Comments: (Partial DNR. Patient is fine with intubation but declines CPR/ cardioversion if needed.  Patient and husband consented for risks of anesthesia including but not limited to:  - adverse reactions to medications - risk of airway placement if required - damage to eyes, teeth, lips or other oral mucosa - nerve damage due to positioning  - sore throat or hoarseness - Damage to heart, brain, nerves, lungs, other parts of body or loss of life  They voiced understanding and assent.)       Anesthesia Quick Evaluation

## 2023-07-11 NOTE — Progress Notes (Signed)
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  Patient has had disease progression and poor tolerance of previous chemotherapy.  Patient presenting with generalized weakness and has a positive urine culture from 10/19. Repeat CT scan shows no significant change from previous CT scan, large volume gastric mass and gastrohepatic ligament adenopathy unchanged from prior, peritoneal metastases and a large volume intraperitoneal free fluid.  Left adrenal gland metastases unchanged, small right pleural effusion.  10/24.  3.45 L drawn off from abdomen with paracentesis. 10/25.  Patient feeling a little bit better.  Hemoglobin drifted down to 7.0.  Blood transfusion ordered.  MRCP showing obstruction of common bile duct and extensive cancer. 10/26.  Hemoglobin up to 8.6.  Patient interested in hospice facility. 10/27.  Patient agreeable to home with hospice until hospice facility opens up. 10/28.  Patient seen by palliative care and looking into ERCP with stent and abdominal drainage catheter.  Interventional radiology team and GI will reevaluate for potential procedures. 10/29.  Anesthesia ordered labs.  Sodium came back at 121 so ERCP and stent was canceled.  1 dose of tolvaptan ordered.  Hemoglobin 6.7 and 1 unit of blood ordered.  Interventional radiology okay with placing an abdominal drain with a low-sodium   Assessment and Plan: * Primary malignant neuroendocrine tumor of esophagus (HCC) CT scan and MRCP showing progression of tumor.  Patient is a DNR.  Patient agreeable to go home with hospice until hospice facility opens up.  Equipment delivered.    Hyponatremia Likely cancer related.  Sodium 121 this morning.  Will try to give a dose of tolvaptan serum sodium is high  enough for ERCP and stent procedure.  Liver lesions on CT concerning for metastases, 07/01/23 MRCP showing tumor causing obstruction of common bile duct.  ERCP and stent procedure canceled today secondary to hyponatremia.  Ascites s/p paracentesis 07/06/23 3.45 L drawn off with paracentesis on 10/24.  IV albumin given at that time.  Interventional radiology to place a abdominal drain today.  Abnormal LFTs MRCP showing obstructing common bile duct secondary to tumor.  ERCP and stent canceled secondary to hyponatremia.  Bilirubin up to 9.5.  Cancer associated pain Continue fentanyl patch and oxycodone  Chemotherapy-induced pancytopenia (HCC) White blood cell count 2.5.  Hemoglobin 6.7.  Platelet count 143.  Patient to receive 1 unit of packed red blood cells earlier in the hospital course.  Will give another unit of packed red blood cells today.  Generalized weakness Continue working with PT while here.  E. coli UTI Completed antibiotic  Hypotension On midodrine 10 mg 3 times daily  ATRIAL FIBRILLATION Paroxysmal in nature on flecainide and low-dose metoprolol.         Subjective: Patient felt okay.  Not much abdominal pain.  Poor appetite.  Patient admitted with fatigue.  Imaging showing progressive cancer.  Physical Exam: Vitals:   07/11/23 0511 07/11/23 0727 07/11/23 0739 07/11/23 1220  BP: (!) 90/54 (!) 79/48 (!) 82/52 (!) 90/58  Pulse: 91 89 88 80  Resp: 16 15  16   Temp: 98.4 F (36.9 C) 97.8 F (36.6 C)  97.8 F (36.6 C)  TempSrc: Oral  Oral Oral  SpO2: 96% 97%  98%  Weight:      Height:  Physical Exam HENT:     Head: Normocephalic.     Mouth/Throat:     Pharynx: No oropharyngeal exudate.  Eyes:     General: Lids are normal.     Comments: Conjunctiva icteric  Cardiovascular:     Rate and Rhythm: Regular rhythm. Tachycardia present.     Heart sounds: Normal heart sounds, S1 normal and S2 normal.  Pulmonary:     Breath sounds: No decreased breath  sounds, wheezing, rhonchi or rales.  Abdominal:     General: There is distension.     Palpations: Abdomen is soft.     Tenderness: There is no abdominal tenderness.  Musculoskeletal:     Right lower leg: No swelling.     Left lower leg: No swelling.  Skin:    General: Skin is warm.     Coloration: Skin is jaundiced.  Neurological:     Mental Status: She is alert and oriented to person, place, and time.     Data Reviewed: Sodium 121 this morning up to 123, total bilirubin 9.5, hemoglobin 6.7, platelet count 143, white blood cell count 2.5  Family Communication: Spoke with husband at the bedside  Disposition: Status is: Inpatient Remains inpatient appropriate because: With sodium being low, ERCP and stent procedure canceled today.  Will give a dose of tolvaptan to try to increase sodium.  Will transfuse 1 unit of packed red blood cells.  Patient will have abdominal catheter placed today by interventional radiology.  Planned Discharge Destination: Home with hospice    Time spent: 35 minutes Case discussed with GI, anesthesia, interventional radiology, nursing staff and palliative care.  Author: Alford Highland, MD 07/11/2023 12:40 PM  For on call review [email or web address].

## 2023-07-11 NOTE — Progress Notes (Signed)
   07/11/23 1300  Spiritual Encounters  Type of Visit Initial  Care provided to: Patient  Referral source Chaplain team  Reason for visit Routine spiritual support  OnCall Visit No  Spiritual Framework  Presenting Themes Meaning/purpose/sources of inspiration  Patient Stress Factors Not reviewed  Interventions  Spiritual Care Interventions Made Established relationship of care and support;Compassionate presence;Reflective listening  Intervention Outcomes  Outcomes Connection to spiritual care;Awareness of support  Spiritual Care Plan  Spiritual Care Issues Still Outstanding No further spiritual care needs at this time (see row info)   During morning meeting received referral to see patient. Spoke with patient and they seem to be handling the situation well. Patient was giving a time frame of how long they have left to live. We talked briefly about that but patient is a believer in [facility]. But, patient also showed that she understands and hear what the medical team is saying about her medical condition.

## 2023-07-11 NOTE — Progress Notes (Signed)
Patient clinically stable post IR Pleurx drain placement per  Dr Fredia Sorrow, tolerated well. Vitals stable pre and post procedure. Received Versed 1 mg along with Fentanyl 50 mcg IV for procedure. Report given to [facility] RN post procedure/recovery/bedside/101, family at bedside with update given.

## 2023-07-11 NOTE — TOC Progression Note (Signed)
Transition of Care [facility]) - Progression Note    Patient Details  Name: Ariana Wilson MRN: [id number] Date of Birth: Jul 19, 1961  Transition [facility] Care [facility]) CM/SW Contact  Allena Katz, LCSW Phone Number: 07/11/2023, 10:55 AM  Clinical Narrative:   Pt to have drain placed tomorrow. Pt assessed by both [facility] and [facility] who are unable to take but will continue to follow as pt is not medically ready yet.         Expected Discharge Plan and Services                                               Social Determinants of Health (SDOH) Interventions SDOH Screenings   Food Insecurity: No Food Insecurity (07/06/2023)  Housing: Low Risk  (07/06/2023)  Transportation Needs: No Transportation Needs (07/06/2023)  Utilities: Not At Risk (07/06/2023)  Alcohol Screen: Low Risk  (04/20/2022)  Depression (PHQ2-9): Low Risk  (04/20/2022)  Financial Resource Strain: Low Risk  (04/18/2023)   Received from [facility]  Physical Activity: Inactive (04/20/2022)  Social Connections: Socially Integrated (04/20/2022)  Stress: No Stress Concern Present (04/20/2022)  Tobacco Use: Low Risk  (07/05/2023)    Readmission Risk Interventions     No data to display

## 2023-07-11 NOTE — Procedures (Signed)
Interventional Radiology Procedure Note  Procedure: Tunneled peritoneal PleurX catheter placement  Complications: None  Estimated Blood Loss: < 10 mL  Findings: 15 Fr tunneled PleurX catheter placed from lateral RLQ approach. 350 mL ascites removed. Course up towards RUQ.  Jodi Marble. Fredia Sorrow, M.D Pager:  [phone]

## 2023-07-11 NOTE — TOC Progression Note (Signed)
Transition of Care [facility]) - Progression Note    Patient Details  Name: Ariana Wilson MRN: [id number] Date of Birth: 11/24/1960  Transition of Care [facility]) CM/SW Contact  Allena Katz, LCSW Phone Number: 07/11/2023, 8:49 AM  Clinical Narrative:   [facility] unable to accept at this time. Pt may be getting a stent and still not medically ready. [facility] states once loose ends are tied up they can reassess. [facility] to assess today.          Expected Discharge Plan and Services                                               Social Determinants of Health (SDOH) Interventions SDOH Screenings   Food Insecurity: No Food Insecurity (07/06/2023)  Housing: Low Risk  (07/06/2023)  Transportation Needs: No Transportation Needs (07/06/2023)  Utilities: Not At Risk (07/06/2023)  Alcohol Screen: Low Risk  (04/20/2022)  Depression (PHQ2-9): Low Risk  (04/20/2022)  Financial Resource Strain: Low Risk  (04/18/2023)   Received from [facility]  Physical Activity: Inactive (04/20/2022)  Social Connections: Socially Integrated (04/20/2022)  Stress: No Stress Concern Present (04/20/2022)  Tobacco Use: Low Risk  (07/05/2023)    Readmission Risk Interventions     No data to display

## 2023-07-11 NOTE — Progress Notes (Signed)
[facility]- Civil engineer, contracting [facility])    Received request from Transitions of Care Manger for family interest in [facility].   Met with patient and the spouse at the bedside to confirm interest and explain services. Patient is not appropriate for the [facility] at this time, but will keep a close eye on patient's condition for any changes.  Transitions of Care manager aware.  Please call with any Hospice related questions or concerns.    Thank you for the opportunity to participate in this patient's care.    Redge Gainer,  [facility] Liaison [phone]

## 2023-07-11 NOTE — Progress Notes (Signed)
   07/11/23 2200  Spiritual Encounters  Care provided to: Pt and family  Referral source Code page  Reason for visit Code  OnCall Visit Yes   Chaplain responded to Rapid response code which was later cancelled. Chaplain remained with patient and daughter who was spending the night to provide after code response care.

## 2023-07-12 ENCOUNTER — Encounter
Admission: EM | Disposition: A | Discharge status: Hospice | Payer: Self-pay | Source: Home / Self Care | Attending: Internal Medicine

## 2023-07-12 DIAGNOSIS — K831 Obstruction of bile duct: Secondary | ICD-10-CM | POA: Diagnosis not present

## 2023-07-12 DIAGNOSIS — C7A8 Other malignant neuroendocrine tumors: Secondary | ICD-10-CM | POA: Diagnosis not present

## 2023-07-12 DIAGNOSIS — E871 Hypo-osmolality and hyponatremia: Secondary | ICD-10-CM | POA: Diagnosis not present

## 2023-07-12 LAB — CBC
HCT: 23.1 % — ABNORMAL LOW (ref 36.0–46.0)
Hemoglobin: 8 g/dL — ABNORMAL LOW (ref 12.0–15.0)
MCH: 30.2 pg (ref 26.0–34.0)
MCHC: 34.6 g/dL (ref 30.0–36.0)
MCV: 87.2 fL (ref 80.0–100.0)
Platelets: 156 10*3/uL (ref 150–400)
RBC: 2.65 MIL/uL — ABNORMAL LOW (ref 3.87–5.11)
RDW: 19.9 % — ABNORMAL HIGH (ref 11.5–15.5)
WBC: 4.3 10*3/uL (ref 4.0–10.5)
nRBC: 0.7 % — ABNORMAL HIGH (ref 0.0–0.2)

## 2023-07-12 LAB — TYPE AND SCREEN
ABO/RH(D): B POS
Antibody Screen: NEGATIVE
Unit division: 0

## 2023-07-12 LAB — BPAM RBC
Blood Product Expiration Date: 202411192359
ISSUE DATE / TIME: 202410291230
Unit Type and Rh: 5100

## 2023-07-12 LAB — SODIUM: Sodium: 123 mmol/L — ABNORMAL LOW (ref 135–145)

## 2023-07-12 SURGERY — ERCP, WITH INTERVENTION IF INDICATED
Anesthesia: General

## 2023-07-12 MED ORDER — HEPARIN SOD (PORK) LOCK FLUSH 100 UNIT/ML IV SOLN
500.0000 [IU] | Freq: Once | INTRAVENOUS | Status: AC
  Administered 2023-07-12: 500 [IU] via INTRAVENOUS
  Filled 2023-07-12: qty 5

## 2023-07-12 NOTE — TOC Transition Note (Signed)
Transition of Care [facility]) - CM/SW Discharge Note   Patient Details  Name: Ariana Wilson MRN: [id number] Date of Birth: April 24, 1961  Transition of Care [facility]) CM/SW Contact:  Allena Katz, LCSW Phone Number: 07/12/2023, 12:20 PM   Clinical Narrative:   Pt has orders to discharge home with home hospice with [facility]. Hillary with [facility] notified. Family already have hospital bed, BSC and oxygen as needed at home and would like ems arranged. Ems arranged and pt is first on list. Hospice notified and will be out to admit today.    Final next level of care: Home w Hospice Care Barriers to Discharge: Barriers Resolved   Patient Goals and CMS Choice CMS Medicare.gov Compare Post Acute Care list provided to:: Patient Choice offered to / list presented to : Patient  Discharge Placement                  Patient to be transferred to facility by: [facility] Name of family member notified: Husband Patient and family notified of of transfer: 07/12/23  Discharge Plan and Services Additional resources added to the After Visit Summary for                                       Social Determinants of Health (SDOH) Interventions SDOH Screenings   Food Insecurity: No Food Insecurity (07/06/2023)  Housing: Low Risk  (07/06/2023)  Transportation Needs: No Transportation Needs (07/06/2023)  Utilities: Not At Risk (07/06/2023)  Alcohol Screen: Low Risk  (04/20/2022)  Depression (PHQ2-9): Low Risk  (04/20/2022)  Financial Resource Strain: Low Risk  (04/18/2023)   Received from [facility]  Physical Activity: Inactive (04/20/2022)  Social Connections: Socially Integrated (04/20/2022)  Stress: No Stress Concern Present (04/20/2022)  Tobacco Use: Low Risk  (07/05/2023)     Readmission Risk Interventions     No data to display

## 2023-07-12 NOTE — Progress Notes (Signed)
sodium 122  previous sodium 123   RN notified Dr. Lindajo Royal of the above information.   RN was told by Dr. Para March that this patient is a hospice patient.    RN stated Patient is not currently comfort care. RN has orders to draw the patient's sodium levels q8h. Potential ERCP 07/12/23. ERCP was postponed yesterday due to the patient's sodium levels and her low Hgb. Patient received tolvaptan to increase her sodium levels and 1u PRBC during dayshift 07/11/23.   No orders Received.

## 2023-07-12 NOTE — Progress Notes (Signed)
Rapid Response CROSS COVER NOTE  NAME: Ariana Wilson MRN: [id number] DOB : Feb 21, 1961    Concern as stated by nurse / staff   Rapid response for BP 86/46     Pertinent findings on chart review: Last progress note reviewed TRH communication reviewed  "Stage IV neuroendocrine carcinoma of esophagus presenting with weakness...seen by palliative care s/p drain for the malignant ascites.  Marland KitchenMarland KitchenMarland KitchenPatient will go home with hospice once procedures are done".  Patient assessment Upon my arrival in the room,  -Nurse at bedside,  -Patient lying comfortably in bed, daughter at bedside in recliner.   -Patient is in no distress.   -Rapid response team and chaplain subsequently arrived.  -Daughter and patient both voiced understanding of patient's condition and desirous of no heroic measures and daughter states mother is at baseline and resting -Rapid response called off.  Patient expressed thankfulness    07/11/2023   11:55 PM 07/11/2023    9:39 PM 07/11/2023    9:00 PM  Vitals with BMI  Systolic 94 86 80  Diastolic 56 48 46  Pulse 88 87     Physical Exam Vitals and nursing note reviewed.  Constitutional:      General: She is not in acute distress. HENT:     Head: Normocephalic and atraumatic.  Cardiovascular:     Rate and Rhythm: Normal rate and regular rhythm.     Heart sounds: Normal heart sounds.  Pulmonary:     Effort: Pulmonary effort is normal.     Breath sounds: Normal breath sounds.  Abdominal:     Palpations: Abdomen is soft.     Tenderness: There is no abdominal tenderness.  Neurological:     Mental Status: Mental status is at baseline.      Assessment and  Interventions   Assessment:  Stage IV neuroendocrine carcinoma s/p drain for malignant ascites, with plans for hospice at discharge  Plan: continue current management X X      CRITICAL CARE Performed by: Andris Baumann   Total critical care time: 32 minutes  Critical care time was exclusive  of separately billable procedures and treating other patients.  Critical care was necessary to treat or prevent imminent or life-threatening deterioration.  Critical care was time spent personally by me on the following activities: development of treatment plan with patient and/or surrogate as well as nursing, discussions with consultants, evaluation of patient's response to treatment, examination of patient, obtaining history from patient or surrogate, ordering and performing treatments and interventions, ordering and review of laboratory studies, ordering and review of radiographic studies, pulse oximetry and re-evaluation of patient's condition.

## 2023-07-12 NOTE — Progress Notes (Signed)
PT Cancellation Note  Patient Details Name: Ariana Wilson MRN: [id number] DOB: 28-Aug-1961   Cancelled Treatment:    Reason Eval/Treat Not Completed: Fatigue/lethargy limiting ability to participate Pt decline PT services today.   Hortencia Conradi, PTA  07/12/23, 10:36 AM

## 2023-07-12 NOTE — Discharge Summary (Signed)
Physician Discharge Summary   Patient: Ariana Wilson MRN: [id number] DOB: [birth year]  Admit date:     07/05/2023  Discharge date: 07/12/23  Discharge Physician: Marrion Coy   PCP: Danella Penton, MD   Recommendations at discharge:   Follow-up with hospice, may transition to hospice inpatient if condition deteriorates.  Discharge Diagnoses: Principal Problem:   Primary malignant neuroendocrine tumor of esophagus (HCC) Active Problems:   Hyponatremia   Ascites s/p paracentesis 07/06/23   Liver lesions on CT concerning for metastases, 07/01/23   Abnormal LFTs   Chemotherapy-induced pancytopenia (HCC)   Cancer associated pain   Generalized weakness   E. coli UTI   ATRIAL FIBRILLATION   Hypotension   Hypothyroidism, adult   Palliative care encounter   Common bile duct (CBD) obstruction  Resolved Problems:   * No resolved hospital problems. 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  Patient has had disease progression and poor tolerance of previous chemotherapy.  Patient presenting with generalized weakness and has a positive urine culture from 10/19. Repeat CT scan shows no significant change from previous CT scan, large volume gastric mass and gastrohepatic ligament adenopathy unchanged from prior, peritoneal metastases and a large volume intraperitoneal free fluid.  Left adrenal gland metastases unchanged, small right pleural effusion.  10/24.  3.45 L drawn off from abdomen with paracentesis. 10/25.  Patient feeling a little bit better.  Hemoglobin drifted down to 7.0.  Blood transfusion ordered.  MRCP showing obstruction of common bile duct and extensive cancer. 10/26.  Hemoglobin up to 8.6.  Patient interested in hospice facility. 10/27.  Patient agreeable to home with hospice until hospice facility opens up. 10/28.  Patient seen by palliative care and looking into ERCP with stent and abdominal  drainage catheter.  Interventional radiology team and GI will reevaluate for potential procedures. 10/29.  Anesthesia ordered labs.  Sodium came back at 121 so ERCP and stent was canceled.  1 dose of tolvaptan ordered.  Hemoglobin 6.7 and 1 unit of blood ordered.  Interventional radiology okay with placing an abdominal drain with a low-sodium   Assessment and Plan: * Primary malignant neuroendocrine tumor of esophagus (HCC) CT scan and MRCP showing progression of tumor.  Patient is a DNR.  Patient agreeable to go home with hospice until hospice facility opens up.  Equipment delivered.    Hyponatremia Likely cancer related.  Sodium level 123 today, and likely improve with end-stage cancer.  No need for additional treatment.  Liver lesions on CT concerning for metastases, 07/01/23 MRCP showing tumor causing obstruction of common bile duct.   Had a long discussion with the patient and husband, patient has made decision not to have any additional treatment for common bile duct.  Ascites s/p paracentesis 07/06/23 3.45 L drawn off with paracentesis on 10/24.  Abdominal drain placed on 10/29 for comfort.  Abnormal LFTs Continue to have jaundice due to CBD obstruction.  Cancer associated pain Continue fentanyl patch and oxycodone  Chemotherapy-induced pancytopenia (HCC) White blood cell count 2.5.  Hemoglobin 6.7.  Platelet count 143.  Deceived 1 unit of PRBC, no additional treatment will be given due to her terminal condition.  Generalized weakness   E. coli UTI Completed antibiotic  Hypotension On midodrine 10 mg 3 times daily  Paroxysmal ATRIAL FIBRILLATION Paroxysmal in nature on flecainide and low-dose metoprolol.         Consultants: GI Procedures performed: None  Disposition:  Hospice/home Diet recommendation:  Discharge Diet Orders (From admission, onward)     Start     Ordered   07/12/23 0000  Diet general       Comments: Soft diet   07/12/23 0931            Soft diet DISCHARGE MEDICATION: Allergies as of 07/12/2023       Reactions   Sulfa Antibiotics Hives   Says her tongue swelled a bit   Sulfonamide Derivatives Swelling   Tongue swelling        Medication List     STOP taking these medications    Calcium Carbonate-Vitamin D 600-200 MG-UNIT Tabs   cephALEXin 500 MG capsule Commonly known as: KEFLEX   cetirizine 10 MG tablet Commonly known as: ZYRTEC   Cholecalciferol 25 MCG (1000 UT) tablet   dexamethasone 4 MG tablet Commonly known as: DECADRON   famotidine 40 MG tablet Commonly known as: PEPCID   lidocaine-prilocaine cream Commonly known as: EMLA   loperamide 2 MG capsule Commonly known as: IMODIUM   prochlorperazine 10 MG tablet Commonly known as: COMPAZINE   psyllium 58.6 % powder Commonly known as: METAMUCIL   simvastatin 20 MG tablet Commonly known as: ZOCOR   Zytaze 25-500 MG Caps Generic drug: Zinc Citrate-Phytase       TAKE these medications    cyanocobalamin 1000 MCG tablet Commonly known as: VITAMIN B12 Take 1,000 mcg by mouth daily.   dicyclomine 20 MG tablet Commonly known as: BENTYL Take 20 mg by mouth 2 (two) times daily.   diphenoxylate-atropine 2.5-0.025 MG tablet Commonly known as: LOMOTIL Take 2 tablets by mouth every 6 (six) hours as needed for diarrhea or loose stools.   feeding supplement Liqd Take 237 mLs by mouth 3 (three) times daily between meals.   fentaNYL 25 MCG/HR Commonly known as: DURAGESIC Place 1 patch onto the skin every 3 (three) days.   flecainide 50 MG tablet Commonly known as: TAMBOCOR Take 1 tablet by mouth once.   lactulose 10 GM/15ML solution Commonly known as: CHRONULAC Take 15-30 mLs (10-20 g total) by mouth 3 (three) times daily as needed for mild constipation.   Melatonin 10 MG Tabs Take 10 mg by mouth daily as needed.   metoprolol tartrate 25 MG tablet Commonly known as: LOPRESSOR Take 0.5 tablets (12.5 mg total) by mouth 2 (two)  times daily. What changed:  how much to take when to take this reasons to take this   midodrine 10 MG tablet Commonly known as: PROAMATINE Take 1 tablet (10 mg total) by mouth 3 (three) times daily with meals. What changed: when to take this   mometasone 50 MCG/ACT nasal spray Commonly known as: NASONEX Place 2 sprays into the nose daily.   OLANZapine 10 MG tablet Commonly known as: ZYPREXA Take 1 tablet (10 mg total) by mouth at bedtime. What changed:  when to take this reasons to take this   ondansetron 8 MG tablet Commonly known as: ZOFRAN Take 1 tablet (8 mg total) by mouth every 8 (eight) hours as needed for nausea, vomiting or refractory nausea / vomiting. Start on the third day after chemotherapy.   oxyCODONE 5 MG immediate release tablet Commonly known as: Oxy IR/ROXICODONE Take 1 tablet (5 mg total) by mouth every 4 (four) hours as needed for severe pain.   pantoprazole 40 MG tablet Commonly known as: PROTONIX Take 40 mg by mouth daily.   polyethylene glycol 17 g packet Commonly known as: MIRALAX / GLYCOLAX  Take 17 g by mouth daily as needed for mild constipation, moderate constipation or severe constipation.   promethazine 25 MG tablet Commonly known as: PHENERGAN Take 1 tablet (25 mg total) by mouth every 8 (eight) hours as needed for refractory nausea / vomiting. May cause sleepiness.   traZODone 50 MG tablet Commonly known as: DESYREL Take 50 mg by mouth at bedtime as needed.        Follow-up Information     hospice at home Follow up in 1 day(s).                 Discharge Exam: Filed Weights   07/05/23 1526  Weight: 67.6 kg   General exam: Appears calm and comfortable,frail Respiratory system: Clear to auscultation. Respiratory effort normal. Cardiovascular system: S1 & S2 heard, RRR. No JVD, murmurs, rubs, gallops or clicks. No pedal edema. Gastrointestinal system: Abdomen is distended, soft and nontender. No organomegaly or masses  felt. Normal bowel sounds heard. Central nervous system: Alert and oriented. No focal neurological deficits. Extremities: Symmetric 5 x 5 power. Skin: No rashes, lesions or ulcers Psychiatry: Judgement and insight appear normal. Mood & affect appropriate.    Condition at discharge: poor  The results of significant diagnostics from this hospitalization (including imaging, microbiology, ancillary and laboratory) are listed below for reference.   Imaging Studies: IR Perc Tun Perit Cath Wo Port  Result Date: 07/11/2023 CLINICAL DATA:  Recurrent malignant ascites related to metastatic neuroendocrine carcinoma and need for tunneled peritoneal drainage catheter for palliative drainage. EXAM: INSERTION OF TUNNELED PERITONEAL DRAINAGE CATHETER ANESTHESIA/SEDATION: Moderate (conscious) sedation was employed during this procedure. A total of Versed 1.0 mg and Fentanyl 50 mcg was administered intravenously by radiology nursing. Moderate Sedation Time: 22 minutes. The patient's level of consciousness and vital signs were monitored continuously by radiology nursing throughout the procedure under my direct supervision. MEDICATIONS: 2 g IV Ancef. Antibiotic was administered in an appropriate time interval for the procedure. FLUOROSCOPY: 2 seconds. PROCEDURE: The procedure, risks, benefits, and alternatives were explained to the patient. Questions regarding the procedure were encouraged and answered. The patient understands and consents to the procedure. A time-out was performed prior to initiating the procedure. The right abdominal wall was prepped with chlorhexidine in a sterile fashion, and a sterile drape was applied covering the operative field. A sterile gown and sterile gloves were used for the procedure. Local anesthesia was provided with 1% Lidocaine. Ultrasound image documentation was performed. Fluoroscopy during the procedure and fluoroscopic spot radiograph confirms appropriate catheter position. After  creating a small skin incision, a 19 gauge needle was advanced into the peritoneal cavity under ultrasound guidance. A guide wire was then advanced under fluoroscopy into the peritoneal cavity. Peritoneal access was dilated serially and a 16-French peel-away sheath placed. A 15.5 French tunneled PleurX catheter was placed. This was tunneled from an incision 5 cm below the peritoneal access to the access site. The catheter was advanced through the peel-away sheath. The sheath was then removed. Final catheter positioning was confirmed with a fluoroscopic spot image. The peritoneal access incision was closed with subcutaneous and subcuticular 4-0 Vicryl. Dermabond was applied to the incision. A Prolene retention suture was applied at the catheter exit site. Large volume paracentesis was performed through a vacuum drainage bottle. COMPLICATIONS: None. FINDINGS: The catheter was placed via the right abdominal wall. Catheter course is superior towards the right upper quadrant. Approximately 350 mL of ascites was able to be removed after catheter placement. IMPRESSION: Placement of tunneled  peritoneal drainage catheter via right abdominal approach. 350 mL of ascites was removed today after catheter placement. Electronically Signed   By: Irish Lack M.D.   On: 07/11/2023 16:22   MR ABDOMEN MRCP W WO CONTAST  Result Date: 07/07/2023 CLINICAL DATA:  Five days of abdominal pain and distention, worsening constipation, history of metastatic esophageal cancer EXAM: MRI ABDOMEN WITHOUT AND WITH CONTRAST (INCLUDING MRCP) TECHNIQUE: Multiplanar multisequence MR imaging of the abdomen was performed both before and after the administration of intravenous contrast. Heavily T2-weighted images of the biliary and pancreatic ducts were obtained, and three-dimensional MRCP images were rendered by post processing. CONTRAST:  7mL GADAVIST GADOBUTROL 1 MMOL/ML IV SOLN COMPARISON:  CT abdomen pelvis, 07/06/2023, 07/01/2023 FINDINGS:  Lower chest: No acute abnormality. Hepatobiliary: T2 hyperintense, diffusion restricting, slightly hyperenhancing liver lesions of the posterior right lobe of the liver, largest in hepatic segment VII measuring 1.4 x 0.8 cm (series 4, image 15), additional lesion superiorly in hepatic segment VII measuring 0.5 cm (series 4, image 14). Gallstones. No gallbladder wall thickening. Intra and extrahepatic biliary ductal dilatation, with abrupt obstruction of the common bile duct at the margin of the pancreatic head (series 3, image 17). Common bile or common hepatic duct measures up to 1.0 cm in caliber. Pancreas: There is again bulky gastrohepatic ligament lymphadenopathy and soft tissue which likely infiltrates the adjacent superior pancreatic head and neck (series 9, image 19). No pancreatic ductal dilatation or surrounding inflammatory changes. Spleen: Normal in size without significant abnormality. Adrenals/Urinary Tract: Unchanged enhancing left adrenal mass measuring 4.5 x 2.8 cm (series 23, image 42). Kidneys are normal, without renal calculi, solid lesion, or hydronephrosis. Stomach/Bowel: Unchanged infiltrative gastroesophageal junction and gastric fundus mass (series 23, image 31). No evidence of other bowel wall thickening, distention, or inflammatory changes. Vascular/Lymphatic: The portal confluence is sharply narrowed and possibly partially occluded by mass effect of adjacent lymphadenopathy (series 23, image 46). Mesenteric varices about the ventral abdominal wall (series 23, image 65). Unchanged bulky, matted retroperitoneal retrocrural, and porta hepatis lymphadenopathy (series 23, image 49). Other: No abdominal wall hernia or abnormality. Large volume ascites throughout the abdomen and pelvis, similar to prior examination. Diffuse peritoneal thickening and enhancement. Musculoskeletal: No acute or significant osseous findings. IMPRESSION: 1. Unchanged infiltrative gastroesophageal junction and gastric  fundus mass. 2. Unchanged bulky, matted retroperitoneal, retrocrural, and porta hepatis lymphadenopathy. This appears to extend and directly involve the adjacent pancreatic head and neck, with obstruction of the common bile duct at the margin of the pancreas. 3. Unchanged enhancing left adrenal mass, consistent with metastatic disease. 4. Two small liver lesions of the posterior right lobe of the liver, consistent with hepatic metastatic disease. 5. The portal confluence is sharply narrowed and possibly partially occluded by mass effect of adjacent lymphadenopathy. Mesenteric varices about the ventral abdominal wall. 6. Large volume ascites throughout the abdomen and pelvis, similar to prior examination. Diffuse peritoneal thickening and enhancement, consistent with peritoneal metastatic disease. 7. Cholelithiasis. Electronically Signed   By: Jearld Lesch M.D.   On: 07/07/2023 19:02   US Paracentesis  Result Date: 07/06/2023 INDICATION: Paracentesis for peritoneal metastasis with large volume free fluid. EXAM: ULTRASOUND GUIDED Therapeutic and Diagnostic  PARACENTESIS MEDICATIONS: None. COMPLICATIONS: None immediate. PROCEDURE: Informed written consent was obtained from the patient after a discussion of the risks, benefits and alternatives to treatment. A timeout was performed prior to the initiation of the procedure. Initial ultrasound scanning demonstrates a large amount of ascites within the right lower abdominal quadrant.  The right lower abdomen was prepped and draped in the usual sterile fashion. 1% lidocaine was used for local anesthesia. Following this, a 19 gauge, 7-cm, Yueh catheter was introduced. An ultrasound image was saved for documentation purposes. The paracentesis was performed. The catheter was removed and a dressing was applied. The patient tolerated the procedure well without immediate post procedural complication. FINDINGS: A total of approximately 3.45 liters of clear yellow fluid was  removed. Samples were sent to the laboratory as requested by the clinical team. IMPRESSION: Successful ultrasound-guided paracentesis yielding 3.45 liters of peritoneal fluid. Performed by Ardith Dark NP-C Electronically Signed   By: Irish Lack M.D.   On: 07/06/2023 16:16   CT ABDOMEN PELVIS WO CONTRAST  Result Date: 07/06/2023 CLINICAL DATA:  Abdominal pain and flank pain. RIGHT lower quadrant pain. Ongoing chemotherapy. Esophageal cancer. * Tracking Code: BO * is EXAM: CT ABDOMEN AND PELVIS WITHOUT CONTRAST TECHNIQUE: Multidetector CT imaging of the abdomen and pelvis was performed following the standard protocol without IV contrast. RADIATION DOSE REDUCTION: This exam was performed according to the departmental dose-optimization program which includes automated exposure control, adjustment of the mA and/or kV according to patient size and/or use of iterative reconstruction technique. COMPARISON:  CT 07/01/2019 FINDINGS: Lower chest: Small LEFT pleural effusion Hepatobiliary: No focal hepatic lesion identified on noncontrast exam gallbladder collapsed. No biliary duct dilatation evident. Pancreas: No pancreatic inflammation identified. Spleen: Normal spleen Adrenals/urinary tract: Probable LEFT adrenal gland metastasis unchanged measuring 2.7 x 4.1 cm. No nephrolithiasis or ureterolithiasis. No bladder calculi. Stomach/Bowel: Mass in the gastric cardiac region and gastrohepatic lumen is less well-defined on noncontrast CT. No small bowel dilatation. Normal colon. Vascular/Lymphatic: Multiple periaortic lymph nodes in the upper abdomen unchanged from prior. Reproductive: Unremarkable Other: Large volume intraperitoneal free fluid unchanged from comparison exam. Peritoneal implant in ventral abdomen not changed at 18 mm (image 30/2) Musculoskeletal: No aggressive osseous lesion. IMPRESSION: 1. No nephrolithiasis ureterolithiasis or obstructive uropathy. 2. No significant interval change from CT  07/01/2023. No IV contrast 3. Large volume gastric mass and gastrohepatic ligament adenopathy unchanged from prior. 4. Peritoneal metastasis and large volume intraperitoneal free fluid not changed. 5. LEFT adrenal gland metastasis unchanged. 6. Small RIGHT pleural effusion. Electronically Signed   By: Genevive Bi M.D.   On: 07/06/2023 08:32   DG Chest Portable 1 View  Result Date: 07/05/2023 CLINICAL DATA:  Weakness abnormal labs from the cancer center. Pt states labs drawn yesterday at [facility] and also states she had a liter of fluid PTA from cancer center. Pt states taking ABX for a UTI, pt endorses watery type diarrhea. EXAM: PORTABLE CHEST 1 VIEW COMPARISON:  Chest x-ray 07/01/2022, CT abdomen pelvis 07/01/2023, CT chest 08/01/2022 FINDINGS: Axis right chest wall dialysis catheter with tip overlying the superior cavoatrial junction. The heart and mediastinal contours are unchanged. Aortic calcification. No focal consolidation. No pulmonary edema. Left base atelectasis. Trace to small volume left pleural effusion. No pneumothorax. No acute osseous abnormality. IMPRESSION: *Trace to small volume left pleural effusion. * Aortic Atherosclerosis (ICD10-I70.0). Electronically Signed   By: Tish Frederickson M.D.   On: 07/05/2023 22:19   US Paracentesis  Result Date: 07/05/2023 INDICATION: 62 year old female. History of adenocarcinoma of the GE junction. Found to have ascites. Request is for therapeutic and diagnostic paracentesis EXAM: ULTRASOUND GUIDED THERAPEUTIC AND DIAGNOSTIC PARACENTESIS MEDICATIONS: Lidocaine 1% 10 mL COMPLICATIONS: None immediate. PROCEDURE: Informed written consent was obtained from the patient after a discussion of the risks, benefits and alternatives to  treatment. A timeout was performed prior to the initiation of the procedure. Initial ultrasound scanning demonstrates a small amount of ascites within the right lower abdominal quadrant. The right lower abdomen was prepped and  draped in the usual sterile fashion. 1% lidocaine was used for local anesthesia. Following this, a 19 gauge, 10-cm, Yueh catheter was introduced. An ultrasound image was saved for documentation purposes. The paracentesis was performed. The catheter was removed and a dressing was applied. The patient tolerated the procedure well without immediate post procedural complication. FINDINGS: A total of approximately 2.3 L of cloudy yellow fluid was removed. Samples were sent to the laboratory as requested by the clinical team. IMPRESSION: Successful ultrasound-guided therapeutic and diagnostic paracentesis yielding 2.3 liters of peritoneal fluid. Performed by Anders Grant NP Electronically Signed   By: Olive Bass M.D.   On: 07/05/2023 08:42   CT ABDOMEN PELVIS W CONTRAST  Result Date: 07/01/2023 CLINICAL DATA:  Cancer patient being treated with chemotherapy for GE junction neoplasm, presents with loss of appetite and increasing abdominal distension, abdominal pain and worsening constipation. 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 EXAM: CT ABDOMEN AND PELVIS WITH CONTRAST TECHNIQUE: Multidetector CT imaging of the abdomen and pelvis was performed using the standard protocol following bolus administration of intravenous contrast. RADIATION DOSE REDUCTION: This exam was performed according to the departmental dose-optimization program which includes automated exposure control, adjustment of the mA and/or kV according to patient size and/or use of iterative reconstruction technique. CONTRAST:  OMNIPAQUE IOHEXOL 300 MG/ML  SOLN COMPARISON:  There are multiple prior CTs. The 2 most recent do not have available reports as they were performed at a [facility] facility. This includes a CT of the abdomen with contrast 05/18/2023 and CT abdomen  and pelvis with IV contrast 01/12/2023. The most recent exam with an attached report is CT chest, abdomen and pelvis CT with contrast 08/02/2023. the patient's last PET-CT was 09/15/2022 and also does not have an attached report. FINDINGS: Lower chest: There is subsegmental atelectasis in both bases. There is a small left layering pleural effusion which has increased in volume since 05/18/2023. No right pleural effusion. A port catheter again terminates in the upper right atrium. The cardiac size is normal. Compared with 05/18/2023 there is increasing fold thickening in the distal esophagus and proximal stomach, increased luminal effacement. There is a periesophageal lymph node on 2:17 which is larger than previously now 1.6 x 1 cm, previously 1.1 x 0.4 cm (2:17). Hepatobiliary: There is a new 1.6 cm hypervascular lesion posteriorly in segment 7 on 2:23 concerning for metastasis. Another measuring 8 mm is noted in segment 4A on 2:18 and is also suspicious. The liver is mildly steatotic. No other mass enhancement is seen. There is cholelithiasis with the gallbladder contracted. There is increased intrahepatic and extrahepatic biliary prominence with the common bile duct now 9 mm. Etiology may be abutting gastrohepatic ligament and periportal adenopathy in the area contributing to this. Pancreas: No primary mass or ductal dilatation, but there is gastrohepatic ligament adenopathy just above the pancreatic neck which is inseparable from the pancreas in may potentially infiltrate the pancreas. Spleen: Somewhat heterogeneous enhancement in the medial spleen initially but uniform enhancement on the delayed images, probably due to perfusional differences or bolus timing. The spleen is not enlarged. Adrenals/Urinary Tract: There is a new solid 1.8 x 1.3 cm  right adrenal genu mass almost certainly metastatic. The left adrenal gland is not well seen due to adjacent bulky left para-aortic chain adenopathy and could also be  tumor-involved. The kidneys concentrate and excrete contrast symmetrically with no mass enhancement. There is increased trace bilateral perinephric fluid. No hydronephrosis, urinary stones or bladder thickening. Stomach/Bowel: As above there is increased masslike fold thickening at the GE junction extending into the proximal stomach, but there is also increased moderate to severe irregular gastric antral wall thickening, which could be inflammatory or due to additional infiltrating disease. Furthermore the stomach is increasingly fluid distended proximal to this. No small bowel obstruction is seen. An appendix is not seen in this patient. Large volume of retained stool is increased over priors. No bowel wall thickening. Vascular/Lymphatic: Multifocal bulky adenopathy. There is a conglomerate gastrohepatic ligament mass measuring 8.1 x 5.8 cm on 2:32, previously 7.5 x 4.2 cm. This mass completely encases, and narrows the main hepatic artery and splenic artery and encases and severely narrows the proximal hepatic portal vein. There are additional portacaval lymph nodes, largest of these is 2.2 x 1.6 cm on 2:34, slightly larger than previously. There is worsened retroperitoneal lymphadenopathy. Bulky left periaortic chain nodes are noted with the largest of these measuring 3.8 x 2.7 cm on 2:31, on the last CT was 3 x 1.9 cm, with multiple others in this chain abutting and narrowing the left renal vein. Multiple enlarged aortocaval lymph nodes are also noted, for example on 2:39 measuring 2.5 x 1.3 cm, previously 2.1 x 1.2 cm. Some of these lymph nodes partially efface the IVC lumen. No pelvic or bulky mesenteric adenopathy is seen. Reproductive: Status post hysterectomy. No adnexal masses. Other: Since 05/18/2023, large-volume abdominal and pelvic ascites has developed in the abdomen and pelvis with scattered fluid in the mesenteric folds. There is no free air, localizing collection or hemorrhage. There is a new  mesenteric lymph node in the transverse mesocolon measuring 2.0 x 1.4 cm on 2:45. Scattered omental varices. No hernias. Increased mild body wall anasarca and mesenteric congestive changes. Musculoskeletal: No regional bone metastasis is seen. IMPRESSION: 1. Increasing masslike fold thickening at the GE junction extending into the proximal stomach, with increased moderate to severe irregular gastric antral wall thickening, the latter which could be inflammatory or due to additional infiltrating disease. 2. Increasing fluid distended stomach proximal to the antral fold thickening. 3. Increased size of bulky gastrohepatic ligament adenopathy which completely encases and narrows the main hepatic artery and splenic artery, and encases and severely narrows the proximal hepatic portal vein. 4. Increased size of a pre-esophageal lymph node, and increased size of portacaval and aortocaval lymph nodes. 5. 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 6. Two new hypervascular liver lesions concerning for metastases. 7. New large-volume abdominal and pelvic ascites with increased omental varices. 8. Increased intrahepatic and extrahepatic biliary dilatation, etiology may be abutting gastrohepatic ligament and periportal adenopathy in the area. 9. Increased mild body wall anasarca and mesenteric congestive changes. 10. Increased small left pleural effusion. 11. Constipation. 12. The left adrenal gland is not well seen due to adjacent bulky left para-aortic chain adenopathy and could also be tumor-involved. Electronically Signed   By: Almira Bar M.D.   On: 07/01/2023 21:50   DG Abd 2 Views  Result Date: 06/30/2023 CLINICAL DATA:  Abdominal distension, rule out obstruction. EXAM: ABDOMEN - 2 VIEW COMPARISON:  CT 05/18/2023 FINDINGS: No free intra-abdominal air. No small bowel distention or  evidence of obstruction. There is a large volume of stool throughout the colon. Small volume of stool in the  rectum. Multiple pelvic phleboliths. Gallstones on prior CT are not well seen. Small left pleural effusion was present on prior abdominal CT. IMPRESSION: Large volume of stool throughout the colon suggesting constipation. No evidence of bowel obstruction. Electronically Signed   By: Narda Rutherford M.D.   On: 06/30/2023 15:04    Microbiology: Results for orders placed or performed during the hospital encounter of 07/05/23  Blood culture (routine x 2)     Status: None   Collection Time: 07/05/23  9:29 PM   Specimen: BLOOD  Result Value Ref Range Status   Specimen Description BLOOD PORTA CATH  Final   Special Requests   Final    BOTTLES DRAWN AEROBIC AND ANAEROBIC Blood Culture adequate volume   Culture   Final    NO GROWTH 5 DAYS Performed at [facility], [address], [place], [place] [address]    Report Status 07/10/2023 FINAL  Final  Blood culture (routine x 2)     Status: None   Collection Time: 07/05/23  9:29 PM   Specimen: BLOOD  Result Value Ref Range Status   Specimen Description BLOOD BLOOD LEFT ARM  Final   Special Requests   Final    BOTTLES DRAWN AEROBIC AND ANAEROBIC Blood Culture adequate volume   Culture   Final    NO GROWTH 5 DAYS Performed at [facility], [address] [place], [place] [address]    Report Status 07/10/2023 FINAL  Final  Resp panel by RT-PCR (RSV, Flu A&B, Covid) Anterior Nasal Swab     Status: None   Collection Time: 07/05/23 10:50 PM   Specimen: Anterior Nasal Swab  Result Value Ref Range Status   SARS Coronavirus 2 by RT PCR NEGATIVE NEGATIVE Final    Comment: (NOTE) SARS-CoV-2 target nucleic acids are NOT DETECTED.  The SARS-CoV-2 RNA is generally detectable in upper respiratory specimens during the acute phase of infection. The lowest concentration of SARS-CoV-2 viral copies this assay can detect is 138 copies/mL. A negative result does not preclude SARS-Cov-2 infection and should not be used as the sole basis for  treatment or other patient management decisions. A negative result may occur with  improper specimen collection/handling, submission of specimen other than nasopharyngeal swab, presence of viral mutation(s) within the areas targeted by this assay, and inadequate number of viral copies(<138 copies/mL). A negative result must be combined with clinical observations, patient history, and epidemiological information. The expected result is Negative.  Fact Sheet for Patients:  BloggerCourse.com  Fact Sheet for Healthcare Providers:  SeriousBroker.it  This test is no t yet approved or cleared by the [place] FDA and  has been authorized for detection and/or diagnosis of SARS-CoV-2 by FDA under an Emergency Use Authorization (EUA). This EUA will remain  in effect (meaning this test can be used) for the duration of the COVID-19 declaration under Section 564(b)(1) of the Act, 21 U.S.C.section 360bbb-3(b)(1), unless the authorization is terminated  or revoked sooner.       Influenza A by PCR NEGATIVE NEGATIVE Final   Influenza B by PCR NEGATIVE NEGATIVE Final    Comment: (NOTE) The Xpert Xpress SARS-CoV-2/FLU/RSV plus assay is intended as an aid in the diagnosis of influenza from Nasopharyngeal swab specimens and should not be used as a sole basis for treatment. Nasal washings and aspirates are unacceptable for Xpert Xpress SARS-CoV-2/FLU/RSV testing.  Fact  Sheet for Patients: BloggerCourse.com  Fact Sheet for Healthcare Providers: SeriousBroker.it  This test is not yet approved or cleared by the [place] FDA and has been authorized for detection and/or diagnosis of SARS-CoV-2 by FDA under an Emergency Use Authorization (EUA). This EUA will remain in effect (meaning this test can be used) for the duration of the COVID-19 declaration under Section 564(b)(1) of the Act, 21  U.S.C. section 360bbb-3(b)(1), unless the authorization is terminated or revoked.     Resp Syncytial Virus by PCR NEGATIVE NEGATIVE Final    Comment: (NOTE) Fact Sheet for Patients: BloggerCourse.com  Fact Sheet for Healthcare Providers: SeriousBroker.it  This test is not yet approved or cleared by the [place] FDA and has been authorized for detection and/or diagnosis of SARS-CoV-2 by FDA under an Emergency Use Authorization (EUA). This EUA will remain in effect (meaning this test can be used) for the duration of the COVID-19 declaration under Section 564(b)(1) of the Act, 21 U.S.C. section 360bbb-3(b)(1), unless the authorization is terminated or revoked.  Performed at [facility], [address], [place], [place] [address]   Body fluid culture w Gram Stain     Status: None   Collection Time: 07/06/23  3:17 PM   Specimen: PATH Cytology Peritoneal fluid  Result Value Ref Range Status   Specimen Description   Final    PERITONEAL Performed at [facility], [address] [place], [place] [address]    Special Requests   Final    NONE Performed at [facility], [address] [place], [place] [address]    Gram Stain   Final    CYTOSPIN SMEAR WBC PRESENT, PREDOMINANTLY MONONUCLEAR NO ORGANISMS SEEN    Culture   Final    NO GROWTH 3 DAYS Performed at [facility], [address] [place], [place] [address]    Report Status 07/10/2023 FINAL  Final    Labs: CBC: Recent Labs  Lab 07/05/23 2127 07/06/23 0602 07/07/23 0629 07/08/23 0545 07/11/23 0904 07/12/23 0420  WBC 1.5* 1.4* 1.1* 1.3* 2.5* 4.3  NEUTROABS 0.5*  --   --   --   --   --   HGB 9.8* 7.8* 7.0* 8.6* 6.7* 8.0*  HCT 30.7* 23.8* 20.5* 23.9* 19.9* 23.1*  MCV 93.3 92.6 89.5 85.1 90.9 87.2  PLT 50* 37* 46* 74* 143* 156   Basic Metabolic Panel: Recent Labs  Lab 07/06/23 0602 07/07/23 0629 07/08/23 0545  07/11/23 0904 07/11/23 1027 07/11/23 2018 07/12/23 0420  NA 132* 130* 128* 121* 123* 122* 123*  K 4.0 4.0 4.0 4.1 4.2  --   --   CL 103 99 98 91* 92*  --   --   CO2 23 24 22 23 24   --   --   GLUCOSE 99 105* 98 87 90  --   --   BUN 22 24* 30* 28* 31*  --   --   CREATININE 0.53 0.50 0.42* 0.87 0.90  --   --   CALCIUM 7.6* 7.8* 7.7* 7.5* 7.6*  --   --    Liver Function Tests: Recent Labs  Lab 07/05/23 2127 07/06/23 0602 07/07/23 0629 07/11/23 1027  AST 218* 154* 134* 126*  ALT 285* 217* 182* 165*  ALKPHOS 1,121* 824* 768* 890*  BILITOT 4.9* 4.0* 4.5* 9.5*  PROT 6.2* 4.6* 5.1* 4.7*  ALBUMIN 2.8* 2.1* 2.6* 1.9*   CBG: Recent Labs  Lab 07/11/23 2137  GLUCAP 94    Discharge  time spent: greater than 30 minutes.  Signed: Marrion Coy, MD [facility] 07/12/2023

## 2023-07-12 NOTE — Plan of Care (Signed)
  Problem: Clinical Measurements: Goal: Ability to maintain clinical measurements within normal limits will improve Outcome: Progressing Goal: Will remain free from infection Outcome: Progressing Goal: Respiratory complications will improve Outcome: Progressing Goal: Cardiovascular complication will be avoided Outcome: Progressing   Problem: Pain Management: Goal: General experience of comfort will improve Outcome: Progressing   Problem: Clinical Measurements: Goal: Respiratory complications will improve Outcome: Progressing

## 2023-07-12 NOTE — Progress Notes (Signed)
   07/12/23 1100  Spiritual Encounters  Type of Visit Follow up  Care provided to: Pt and family  Reason for visit Routine spiritual support  OnCall Visit Yes   Chaplain provided follow up support to patient and family members at bedside.

## 2023-07-12 NOTE — Progress Notes (Signed)
   07/11/23 2100  Assess: MEWS Score  BP (!) 80/46   Patient is asymptomatic.   RN called rapid response and notified Dr. Lindajo Royal.  Dr. Para March spoke with patient at the bedside.  No orders received.

## 2023-07-18 ENCOUNTER — Ambulatory Visit: Payer: BC Managed Care – PPO

## 2023-07-18 ENCOUNTER — Other Ambulatory Visit: Payer: BC Managed Care – PPO

## 2023-07-18 ENCOUNTER — Ambulatory Visit: Payer: BC Managed Care – PPO | Admitting: Oncology

## 2023-08-13 DEATH — deceased
